# Patient Record
Sex: Female | Born: 1950
Health system: Southern US, Community
[De-identification: ages and names within clinical notes are randomized; demographics above are authoritative.]

## PROBLEM LIST (undated history)

## (undated) DIAGNOSIS — E119 Type 2 diabetes mellitus without complications: Secondary | ICD-10-CM

## (undated) DIAGNOSIS — I10 Essential (primary) hypertension: Secondary | ICD-10-CM

## (undated) DIAGNOSIS — H3500 Unspecified background retinopathy: Secondary | ICD-10-CM

## (undated) DIAGNOSIS — F330 Major depressive disorder, recurrent, mild: Secondary | ICD-10-CM

## (undated) DIAGNOSIS — D638 Anemia in other chronic diseases classified elsewhere: Secondary | ICD-10-CM

## (undated) DIAGNOSIS — E785 Hyperlipidemia, unspecified: Secondary | ICD-10-CM

## (undated) DIAGNOSIS — C4491 Basal cell carcinoma of skin, unspecified: Secondary | ICD-10-CM

## (undated) DIAGNOSIS — T7840XA Allergy, unspecified, initial encounter: Secondary | ICD-10-CM

## (undated) DIAGNOSIS — F419 Anxiety disorder, unspecified: Secondary | ICD-10-CM

## (undated) DIAGNOSIS — H269 Unspecified cataract: Secondary | ICD-10-CM

## (undated) DIAGNOSIS — M810 Age-related osteoporosis without current pathological fracture: Secondary | ICD-10-CM

## (undated) DIAGNOSIS — M797 Fibromyalgia: Secondary | ICD-10-CM

## (undated) DIAGNOSIS — M109 Gout, unspecified: Secondary | ICD-10-CM

## (undated) HISTORY — DX: Hyperlipidemia, unspecified: E78.5

## (undated) HISTORY — DX: Anxiety disorder, unspecified: F41.9

## (undated) HISTORY — PX: TONSILECTOMY, ADENOIDECTOMY, BILATERAL MYRINGOTOMY AND TUBES: SHX2538

## (undated) HISTORY — DX: Basal cell carcinoma of skin, unspecified: C44.91

## (undated) HISTORY — DX: Unspecified background retinopathy: H35.00

## (undated) HISTORY — DX: Anemia in other chronic diseases classified elsewhere: D63.8

## (undated) HISTORY — DX: Age-related osteoporosis without current pathological fracture: M81.0

## (undated) HISTORY — DX: Gout, unspecified: M10.9

## (undated) HISTORY — DX: Essential (primary) hypertension: I10

## (undated) HISTORY — DX: Fibromyalgia: M79.7

## (undated) HISTORY — DX: Unspecified cataract: H26.9

## (undated) HISTORY — DX: Type 2 diabetes mellitus without complications: E11.9

## (undated) HISTORY — PX: TUBAL LIGATION: SHX77

## (undated) HISTORY — DX: Major depressive disorder, recurrent, mild: F33.0

## (undated) HISTORY — DX: Allergy, unspecified, initial encounter: T78.40XA

---

## 1999-10-16 LAB — HM COLONOSCOPY: HM COLON: NORMAL

## 2004-10-25 ENCOUNTER — Ambulatory Visit: Payer: Self-pay

## 2006-11-14 ENCOUNTER — Ambulatory Visit: Payer: Self-pay | Admitting: Family Medicine

## 2007-02-19 ENCOUNTER — Emergency Department: Payer: Self-pay | Admitting: Emergency Medicine

## 2007-02-21 ENCOUNTER — Emergency Department: Payer: Self-pay | Admitting: Emergency Medicine

## 2008-05-27 ENCOUNTER — Ambulatory Visit: Payer: Self-pay | Admitting: Family Medicine

## 2009-12-13 ENCOUNTER — Ambulatory Visit: Payer: Self-pay

## 2011-01-16 ENCOUNTER — Ambulatory Visit: Payer: Self-pay

## 2012-01-15 ENCOUNTER — Ambulatory Visit: Payer: Self-pay

## 2012-01-15 LAB — HM MAMMOGRAPHY: HM MAMMO: NORMAL

## 2013-04-29 ENCOUNTER — Ambulatory Visit: Payer: Self-pay

## 2014-06-01 ENCOUNTER — Ambulatory Visit: Payer: Self-pay

## 2014-11-19 DIAGNOSIS — Z1389 Encounter for screening for other disorder: Secondary | ICD-10-CM | POA: Diagnosis not present

## 2014-11-19 DIAGNOSIS — I10 Essential (primary) hypertension: Secondary | ICD-10-CM | POA: Diagnosis not present

## 2014-11-19 DIAGNOSIS — Z1151 Encounter for screening for human papillomavirus (HPV): Secondary | ICD-10-CM | POA: Diagnosis not present

## 2014-11-19 DIAGNOSIS — M109 Gout, unspecified: Secondary | ICD-10-CM | POA: Diagnosis not present

## 2014-11-19 DIAGNOSIS — E785 Hyperlipidemia, unspecified: Secondary | ICD-10-CM | POA: Diagnosis not present

## 2014-11-19 DIAGNOSIS — Z719 Counseling, unspecified: Secondary | ICD-10-CM | POA: Diagnosis not present

## 2014-11-19 DIAGNOSIS — Z23 Encounter for immunization: Secondary | ICD-10-CM | POA: Diagnosis not present

## 2014-11-19 DIAGNOSIS — Z1239 Encounter for other screening for malignant neoplasm of breast: Secondary | ICD-10-CM | POA: Diagnosis not present

## 2014-11-19 DIAGNOSIS — Z124 Encounter for screening for malignant neoplasm of cervix: Secondary | ICD-10-CM | POA: Diagnosis not present

## 2014-11-19 DIAGNOSIS — E2839 Other primary ovarian failure: Secondary | ICD-10-CM | POA: Diagnosis not present

## 2014-11-19 DIAGNOSIS — Z1211 Encounter for screening for malignant neoplasm of colon: Secondary | ICD-10-CM | POA: Diagnosis not present

## 2014-11-19 DIAGNOSIS — Z9181 History of falling: Secondary | ICD-10-CM | POA: Diagnosis not present

## 2014-11-19 DIAGNOSIS — Z0000 Encounter for general adult medical examination without abnormal findings: Secondary | ICD-10-CM | POA: Diagnosis not present

## 2014-11-24 DIAGNOSIS — E11359 Type 2 diabetes mellitus with proliferative diabetic retinopathy without macular edema: Secondary | ICD-10-CM | POA: Diagnosis not present

## 2014-11-24 DIAGNOSIS — I10 Essential (primary) hypertension: Secondary | ICD-10-CM | POA: Diagnosis not present

## 2014-11-24 DIAGNOSIS — E785 Hyperlipidemia, unspecified: Secondary | ICD-10-CM | POA: Diagnosis not present

## 2014-11-24 DIAGNOSIS — M109 Gout, unspecified: Secondary | ICD-10-CM | POA: Diagnosis not present

## 2014-11-24 LAB — LIPID PANEL
Cholesterol: 130 mg/dL (ref 0–200)
HDL: 39 mg/dL (ref 35–70)
LDL Cholesterol: 63 mg/dL
Triglycerides: 142 mg/dL (ref 40–160)

## 2014-11-24 LAB — HEMOGLOBIN A1C: Hgb A1c MFr Bld: 6.9 % — AB (ref 4.0–6.0)

## 2014-12-03 DIAGNOSIS — E11359 Type 2 diabetes mellitus with proliferative diabetic retinopathy without macular edema: Secondary | ICD-10-CM | POA: Diagnosis not present

## 2014-12-03 DIAGNOSIS — R490 Dysphonia: Secondary | ICD-10-CM | POA: Diagnosis not present

## 2014-12-03 DIAGNOSIS — E785 Hyperlipidemia, unspecified: Secondary | ICD-10-CM | POA: Diagnosis not present

## 2014-12-03 DIAGNOSIS — I10 Essential (primary) hypertension: Secondary | ICD-10-CM | POA: Diagnosis not present

## 2014-12-03 DIAGNOSIS — H3500 Unspecified background retinopathy: Secondary | ICD-10-CM | POA: Diagnosis not present

## 2014-12-03 DIAGNOSIS — M109 Gout, unspecified: Secondary | ICD-10-CM | POA: Diagnosis not present

## 2014-12-05 ENCOUNTER — Emergency Department: Payer: Self-pay | Admitting: Emergency Medicine

## 2014-12-05 DIAGNOSIS — I10 Essential (primary) hypertension: Secondary | ICD-10-CM | POA: Diagnosis not present

## 2014-12-05 DIAGNOSIS — E119 Type 2 diabetes mellitus without complications: Secondary | ICD-10-CM | POA: Diagnosis not present

## 2014-12-05 DIAGNOSIS — R55 Syncope and collapse: Secondary | ICD-10-CM | POA: Diagnosis not present

## 2014-12-05 DIAGNOSIS — N3090 Cystitis, unspecified without hematuria: Secondary | ICD-10-CM | POA: Diagnosis not present

## 2014-12-20 ENCOUNTER — Ambulatory Visit: Payer: Self-pay | Admitting: Family Medicine

## 2014-12-20 DIAGNOSIS — E2839 Other primary ovarian failure: Secondary | ICD-10-CM | POA: Diagnosis not present

## 2014-12-20 DIAGNOSIS — M8589 Other specified disorders of bone density and structure, multiple sites: Secondary | ICD-10-CM | POA: Diagnosis not present

## 2014-12-20 DIAGNOSIS — M8580 Other specified disorders of bone density and structure, unspecified site: Secondary | ICD-10-CM | POA: Diagnosis not present

## 2014-12-20 LAB — HM DEXA SCAN

## 2015-04-02 ENCOUNTER — Encounter: Payer: Self-pay | Admitting: Family Medicine

## 2015-04-02 DIAGNOSIS — F5104 Psychophysiologic insomnia: Secondary | ICD-10-CM | POA: Insufficient documentation

## 2015-04-02 DIAGNOSIS — R4189 Other symptoms and signs involving cognitive functions and awareness: Secondary | ICD-10-CM | POA: Insufficient documentation

## 2015-04-02 DIAGNOSIS — F330 Major depressive disorder, recurrent, mild: Secondary | ICD-10-CM | POA: Insufficient documentation

## 2015-04-02 DIAGNOSIS — M109 Gout, unspecified: Secondary | ICD-10-CM | POA: Insufficient documentation

## 2015-04-02 DIAGNOSIS — I10 Essential (primary) hypertension: Secondary | ICD-10-CM | POA: Insufficient documentation

## 2015-04-02 DIAGNOSIS — H3500 Unspecified background retinopathy: Secondary | ICD-10-CM | POA: Insufficient documentation

## 2015-04-02 DIAGNOSIS — E113599 Type 2 diabetes mellitus with proliferative diabetic retinopathy without macular edema, unspecified eye: Secondary | ICD-10-CM | POA: Insufficient documentation

## 2015-04-02 DIAGNOSIS — C4491 Basal cell carcinoma of skin, unspecified: Secondary | ICD-10-CM | POA: Insufficient documentation

## 2015-04-02 DIAGNOSIS — E785 Hyperlipidemia, unspecified: Secondary | ICD-10-CM | POA: Insufficient documentation

## 2015-04-02 DIAGNOSIS — M8580 Other specified disorders of bone density and structure, unspecified site: Secondary | ICD-10-CM | POA: Insufficient documentation

## 2015-04-02 DIAGNOSIS — M797 Fibromyalgia: Secondary | ICD-10-CM | POA: Insufficient documentation

## 2015-04-04 ENCOUNTER — Other Ambulatory Visit: Payer: Self-pay | Admitting: Family Medicine

## 2015-04-04 NOTE — Telephone Encounter (Signed)
Patient requesting refill. 

## 2015-04-06 ENCOUNTER — Ambulatory Visit (INDEPENDENT_AMBULATORY_CARE_PROVIDER_SITE_OTHER): Payer: Medicare Other | Admitting: Family Medicine

## 2015-04-06 ENCOUNTER — Encounter: Payer: Self-pay | Admitting: Family Medicine

## 2015-04-06 ENCOUNTER — Other Ambulatory Visit: Payer: Self-pay | Admitting: Family Medicine

## 2015-04-06 VITALS — BP 122/78 | HR 91 | Temp 98.7°F | Resp 18 | Ht 59.0 in | Wt 171.7 lb

## 2015-04-06 DIAGNOSIS — F329 Major depressive disorder, single episode, unspecified: Secondary | ICD-10-CM

## 2015-04-06 DIAGNOSIS — E1136 Type 2 diabetes mellitus with diabetic cataract: Secondary | ICD-10-CM

## 2015-04-06 DIAGNOSIS — F32A Depression, unspecified: Secondary | ICD-10-CM

## 2015-04-06 DIAGNOSIS — E785 Hyperlipidemia, unspecified: Secondary | ICD-10-CM | POA: Diagnosis not present

## 2015-04-06 DIAGNOSIS — I10 Essential (primary) hypertension: Secondary | ICD-10-CM

## 2015-04-06 DIAGNOSIS — E1165 Type 2 diabetes mellitus with hyperglycemia: Secondary | ICD-10-CM

## 2015-04-06 DIAGNOSIS — H269 Unspecified cataract: Secondary | ICD-10-CM | POA: Insufficient documentation

## 2015-04-06 DIAGNOSIS — M109 Gout, unspecified: Secondary | ICD-10-CM

## 2015-04-06 DIAGNOSIS — E669 Obesity, unspecified: Secondary | ICD-10-CM | POA: Diagnosis not present

## 2015-04-06 LAB — POCT GLYCOSYLATED HEMOGLOBIN (HGB A1C): HEMOGLOBIN A1C: 6.9

## 2015-04-06 MED ORDER — FLUOXETINE HCL 20 MG PO CAPS: 20.0000 mg | ORAL_CAPSULE | Freq: Every day | ORAL | Status: DC

## 2015-04-06 MED ORDER — OLMESARTAN MEDOXOMIL 40 MG PO TABS: 40.0000 mg | ORAL_TABLET | Freq: Every day | ORAL | Status: DC

## 2015-04-06 MED ORDER — GLIPIZIDE 5 MG PO TABS: 2.5000 mg | ORAL_TABLET | Freq: Two times a day (BID) | ORAL | Status: DC

## 2015-04-06 MED ORDER — SITAGLIP PHOS-METFORMIN HCL ER 50-1000 MG PO TB24: 2.0000 | ORAL_TABLET | Freq: Every day | ORAL | Status: DC

## 2015-04-06 MED ORDER — ALLOPURINOL 100 MG PO TABS: 100.0000 mg | ORAL_TABLET | Freq: Every day | ORAL | Status: DC

## 2015-04-06 MED ORDER — CARVEDILOL 25 MG PO TABS: 50.0000 mg | ORAL_TABLET | Freq: Every day | ORAL | Status: DC

## 2015-04-06 MED ORDER — ROSUVASTATIN CALCIUM 20 MG PO TABS: 20.0000 mg | ORAL_TABLET | Freq: Every day | ORAL | Status: DC

## 2015-04-06 NOTE — Progress Notes (Signed)
Name: Lindsey [name]   MRN: [id number]    DOB: [birth year]   Date:04/06/2015       Progress Note  Subjective  Chief Complaint  Chief Complaint  Patient presents with  . Medication Refill    4 month F/U  . Diabetes    Checks blood sugar once daily  . Hypertension  . Hyperlipidemia  . Depression    HPI  DMII: she is complaint with her medication, glucose has been stable, denies hypoglycemia, no polydipsia, polyuria or polyphagia. Normal foot exam, eye exam is up to date. Immunization is up to date.   HTN: taking medication and bp is at goal, however because of elevation in uric acid we will stop HCTZ and increase dose of Benicar 40mg . No chest pain, no SOB  Dyslipidemia: taking Crestor, compliant with medication and denies side effects. LDL is at goal  Gout: uric acid very high in Feb, we will stop HCTZ, she was given Allopurinol but never started, we will resend prescription to pharmacy. Recheck levels next visit  Depression: in remission, continue medications   Patient Active Problem List   Diagnosis Date Noted  . Type 2 diabetes mellitus, controlled, with ophthalmic manifestations, with cataract 04/06/2015  . Cataract 04/06/2015  . Benign essential HTN 04/02/2015  . Controlled gout 04/02/2015  . Dyslipidemia 04/02/2015  . Clinical depression 04/02/2015  . Fibromyalgia syndrome 04/02/2015  . Chronic insomnia 04/02/2015  . Mild cognitive disorder 04/02/2015  . Nodular basal cell carcinoma 04/02/2015  . Obesity (BMI 30-39.9) 04/02/2015  . Osteopenia 04/02/2015  . Calcium blood increased 04/02/2015    Past Surgical History  Procedure Laterality Date  . Cesarean section    . Tonsilectomy, adenoidectomy, bilateral myringotomy and tubes      Family History  Problem Relation Age of Onset  . Hypertension Mother   . Osteoporosis Mother   . Cancer Mother     brain tumor  . Hypertension Sister   . Diabetes Sister   . Hypertension Sister   . Cancer Sister   . Cancer  Sister 12    breast    History   Social History  . Marital Status: Divorced    Spouse Name: N/A  . Number of Children: N/A  . Years of Education: N/A   Occupational History  . Not on file.   Social History Main Topics  . Smoking status: Never Smoker   . Smokeless tobacco: Not on file  . Alcohol Use: No  . Drug Use: No  . Sexual Activity: Not Currently   Other Topics Concern  . Not on file   Social History Narrative     Current outpatient prescriptions:  .  allopurinol (ZYLOPRIM) 100 MG tablet, Take 1 tablet (100 mg total) by mouth daily., Disp: 30 tablet, Rfl: 8 .  aspirin 81 MG chewable tablet, Chew 1 tablet by mouth daily., Disp: , Rfl:  .  BLACK COHOSH PO, Take by mouth., Disp: , Rfl:  .  calcium-vitamin D (OSCAL 500/200 D-3) 500-200 MG-UNIT per tablet, Take 1 tablet by mouth daily., Disp: , Rfl:  .  carvedilol (COREG) 25 MG tablet, Take 2 tablets (50 mg total) by mouth daily., Disp: 60 tablet, Rfl: 8 .  FLUoxetine (PROZAC) 20 MG capsule, Take 1 capsule (20 mg total) by mouth daily., Disp: 30 capsule, Rfl: 8 .  GLUCOSE BLOOD VI, , Disp: , Rfl:  .  MULTIPLE VITAMIN PO, Take 1 tablet by mouth daily., Disp: , Rfl:  .  raloxifene (EVISTA)  60 MG tablet, Take 1 tablet by mouth daily., Disp: , Rfl:  .  rosuvastatin (CRESTOR) 20 MG tablet, Take 1 tablet (20 mg total) by mouth daily., Disp: 30 tablet, Rfl: 5 .  SitaGLIPtin-MetFORMIN HCl (JANUMET XR) 50-1000 MG TB24, Take 2 tablets by mouth daily., Disp: 30 tablet, Rfl: 8 .  glipiZIDE (GLUCOTROL) 5 MG tablet, Take 0.5 tablets (2.5 mg total) by mouth 2 (two) times daily before a meal., Disp: 30 tablet, Rfl: 8 .  olmesartan (BENICAR) 40 MG tablet, Take 1 tablet (40 mg total) by mouth daily., Disp: 30 tablet, Rfl: 8  Allergies  Allergen Reactions  . Ace Inhibitors      ROS  Constitutional: Negative for fever or weight change.  Respiratory: Negative for cough and shortness of breath.   Cardiovascular: Negative for chest  pain or palpitations.  Gastrointestinal: Negative for abdominal pain, no bowel changes.  Musculoskeletal: Negative for gait problem or joint swelling.  Skin: Negative for rash.  Neurological: Negative for dizziness or headache.  No other specific complaints in a complete review of systems (except as listed in HPI above).  Objective  Filed Vitals:   04/06/15 0932  BP: 122/78  Pulse: 91  Temp: 98.7 F (37.1 C)  TempSrc: Oral  Resp: 18  Height: 4\' 11"  (1.499 m)  Weight: 171 lb 11.2 oz (77.883 kg)  SpO2: 95%    Body mass index is 34.66 kg/(m^2).  Physical Exam  Constitutional: Patient appears well-developed and well-nourished. No distress.  HENT: Head: Normocephalic and atraumatic.  Nose: Nose normal. Mouth/Throat: Oropharynx is clear and moist. No oropharyngeal exudate.  Eyes: Conjunctivae and EOM are normal. Pupils are equal, round, and reactive to light. No scleral icterus.  Neck: Normal range of motion. Neck supple. No JVD present. No thyromegaly present.  Cardiovascular: Normal rate, regular rhythm and normal heart sounds.  No murmur heard. No BLE edema. Varicose veins Pulmonary/Chest: Effort normal and breath sounds normal. No respiratory distress. Abdominal: Soft.There is no tenderness. no masses Musculoskeletal: Normal range of motion, no joint effusions. No gross deformities Neurological: he is alert and oriented to person, place, and time. No cranial nerve deficit. Coordination, balance, strength, speech and gait are normal.  Skin: Skin is warm and dry. No rash noted. No erythema.  Psychiatric: Patient has a normal mood and affect. behavior is normal. Judgment and thought content normal.  Recent Results (from the past 2160 hour(s))  POCT HgB A1C     Status: Normal   Collection Time: 04/06/15  9:41 AM  Result Value Ref Range   Hemoglobin A1C 6.9     Diabetic Foot Exam: Diabetic Foot Exam - Simple   Simple Foot Form  Visual Inspection  No deformities, no  ulcerations, no other skin breakdown bilaterally:  Yes  Sensation Testing  Intact to touch and monofilament testing bilaterally:  Yes  Pulse Check  Posterior Tibialis and Dorsalis pulse intact bilaterally:  Yes  Comments      PHQ2/9: Depression screen PHQ 2/9 04/06/2015  Decreased Interest 0  Down, Depressed, Hopeless 0  PHQ - 2 Score 0    Fall Risk: Fall Risk  04/06/2015  Falls in the past year? Yes  Number falls in past yr: 1  Injury with Fall? No     Assessment & Plan  1. Type 2 diabetes mellitus with diabetic cataract Continue medication - POCT HgB A1C - SitaGLIPtin-MetFORMIN HCl (JANUMET XR) 50-1000 MG TB24; Take 2 tablets by mouth daily.  Dispense: 30 tablet; Refill: 8 -  glipiZIDE (GLUCOTROL) 5 MG tablet; Take 0.5 tablets (2.5 mg total) by mouth 2 (two) times daily before a meal.  Dispense: 30 tablet; Refill: 8  2. Controlled gout Start medication ,high uric acid - allopurinol (ZYLOPRIM) 100 MG tablet; Take 1 tablet (100 mg total) by mouth daily.  Dispense: 30 tablet; Refill: 8  3. Clinical depression In remission, continue medication, working in her flower beds, enjoys carrying for her grandchildren - FLUoxetine (PROZAC) 20 MG capsule; Take 1 capsule (20 mg total) by mouth daily.  Dispense: 30 capsule; Refill: 8  4. Obesity (BMI 30-39.9) Discussed diet and exercise  5. Benign essential HTN Adjust medication, because of elevated uric acid level - carvedilol (COREG) 25 MG tablet; Take 2 tablets (50 mg total) by mouth daily.  Dispense: 60 tablet; Refill: 8 - olmesartan (BENICAR) 40 MG tablet; Take 1 tablet (40 mg total) by mouth daily.  Dispense: 30 tablet; Refill: 8  6. Dyslipidemia At goal  - rosuvastatin (CRESTOR) 20 MG tablet; Take 1 tablet (20 mg total) by mouth daily.  Dispense: 30 tablet; Refill: 5

## 2015-04-06 NOTE — Patient Instructions (Signed)

## 2015-06-06 ENCOUNTER — Telehealth: Payer: Self-pay

## 2015-06-06 NOTE — Telephone Encounter (Signed)
Opened in error

## 2015-06-24 ENCOUNTER — Ambulatory Visit
Admission: EM | Admit: 2015-06-24 | Discharge: 2015-06-24 | Disposition: A | Discharge status: Home / Self Care | Payer: Medicare Other | Attending: Family Medicine | Admitting: Family Medicine

## 2015-06-24 ENCOUNTER — Encounter: Payer: Self-pay | Admitting: Emergency Medicine

## 2015-06-24 DIAGNOSIS — E785 Hyperlipidemia, unspecified: Secondary | ICD-10-CM | POA: Insufficient documentation

## 2015-06-24 DIAGNOSIS — R350 Frequency of micturition: Secondary | ICD-10-CM | POA: Insufficient documentation

## 2015-06-24 DIAGNOSIS — R300 Dysuria: Secondary | ICD-10-CM | POA: Insufficient documentation

## 2015-06-24 DIAGNOSIS — Z85828 Personal history of other malignant neoplasm of skin: Secondary | ICD-10-CM | POA: Diagnosis not present

## 2015-06-24 DIAGNOSIS — I10 Essential (primary) hypertension: Secondary | ICD-10-CM | POA: Diagnosis not present

## 2015-06-24 DIAGNOSIS — M109 Gout, unspecified: Secondary | ICD-10-CM | POA: Insufficient documentation

## 2015-06-24 LAB — URINALYSIS COMPLETE WITH MICROSCOPIC (ARMC ONLY)
Bacteria, UA: NONE SEEN — AB
Bilirubin Urine: NEGATIVE
Glucose, UA: NEGATIVE mg/dL
Hgb urine dipstick: NEGATIVE
KETONES UR: NEGATIVE mg/dL
LEUKOCYTES UA: NEGATIVE
Nitrite: NEGATIVE
PROTEIN: NEGATIVE mg/dL
SPECIFIC GRAVITY, URINE: 1.01 (ref 1.005–1.030)
pH: 5 (ref 5.0–8.0)

## 2015-06-24 MED ORDER — PHENAZOPYRIDINE HCL 100 MG PO TABS: 100.0000 mg | ORAL_TABLET | Freq: Three times a day (TID) | ORAL | Status: DC | PRN

## 2015-06-24 NOTE — ED Notes (Addendum)
Urinary frequency for 1 week, low back pain

## 2015-06-24 NOTE — Discharge Instructions (Signed)

## 2015-06-25 NOTE — ED Provider Notes (Signed)
CSN: [id number]     Arrival date & time 06/24/15  1643 History   First MD Initiated Contact with Patient 06/24/15 1916     Chief Complaint  Patient presents with  . Urinary Frequency   (Consider location/radiation/quality/duration/timing/severity/associated sxs/prior Treatment) HPI Comments: Single caucasian female with left back pain; urinary frequency x 10 days, headache, decreased appetite.  Lives alone diabetic.  Home fingersticks have been her usual but cannot remember the actual number.  Denied changes in color or smell of urine.  Patient is a 64 y.o. female presenting with frequency. The history is provided by the patient.  Urinary Frequency This is a new problem. The current episode started more than 1 week ago. The problem occurs constantly. The problem has been gradually worsening. Associated symptoms include headaches. Pertinent negatives include no chest pain, no abdominal pain and no shortness of breath. The symptoms are aggravated by exertion. The symptoms are relieved by lying down, eating, position, sleep, rest, relaxation and drinking. She has tried rest, food and water for the symptoms. The treatment provided no relief.    Past Medical History  Diagnosis Date  . Basal cell carcinoma   . Osteoporosis   . Retinopathy   . Acute gout   . Hypertension   . Hyperlipidemia    Past Surgical History  Procedure Laterality Date  . Cesarean section    . Tonsilectomy, adenoidectomy, bilateral myringotomy and tubes     Family History  Problem Relation Age of Onset  . Hypertension Mother   . Osteoporosis Mother   . Cancer Mother     brain tumor  . Hypertension Sister   . Diabetes Sister   . Hypertension Sister   . Cancer Sister   . Cancer Sister 78    breast   Social History  Substance Use Topics  . Smoking status: Never Smoker   . Smokeless tobacco: None  . Alcohol Use: No   OB History    No data available     Review of Systems  Constitutional: Positive for  appetite change. Negative for fever, chills, diaphoresis, activity change, fatigue and unexpected weight change.  HENT: Negative for congestion, dental problem, drooling, ear discharge, ear pain, facial swelling, hearing loss, mouth sores, nosebleeds, postnasal drip, rhinorrhea, sinus pressure, sneezing, sore throat, tinnitus, trouble swallowing and voice change.   Eyes: Negative for photophobia, pain, discharge, redness, itching and visual disturbance.  Respiratory: Negative for cough, shortness of breath, wheezing and stridor.   Cardiovascular: Negative for chest pain, palpitations and leg swelling.  Gastrointestinal: Negative for nausea, vomiting, abdominal pain, diarrhea, constipation, blood in stool and abdominal distention.  Endocrine: Negative for cold intolerance, heat intolerance, polydipsia, polyphagia and polyuria.  Genitourinary: Positive for urgency and frequency. Negative for dysuria, hematuria, flank pain, decreased urine volume, vaginal bleeding, vaginal discharge, enuresis, difficulty urinating, genital sores, vaginal pain, menstrual problem and pelvic pain.  Musculoskeletal: Positive for back pain. Negative for myalgias, joint swelling, arthralgias, gait problem, neck pain and neck stiffness.  Skin: Negative for color change, pallor, rash and wound.  Allergic/Immunologic: Negative for environmental allergies and food allergies.  Neurological: Positive for headaches. Negative for dizziness, tremors, seizures, syncope, facial asymmetry, speech difficulty, weakness, light-headedness and numbness.  Hematological: Negative for adenopathy. Does not bruise/bleed easily.  Psychiatric/Behavioral: Negative for behavioral problems, confusion, sleep disturbance and agitation.    Allergies  Ace inhibitors  Home Medications   Prior to Admission medications   Medication Sig Start Date End Date Taking? Authorizing Provider  allopurinol (ZYLOPRIM)  100 MG tablet Take 1 tablet (100 mg total) by  mouth daily. 04/06/15   Steele Sizer, MD  aspirin 81 MG chewable tablet Chew 1 tablet by mouth daily. 03/06/07   Historical Provider, MD  BLACK COHOSH PO Take by mouth.    Historical Provider, MD  calcium-vitamin D (OSCAL 500/200 D-3) 500-200 MG-UNIT per tablet Take 1 tablet by mouth daily. 03/06/07   Historical Provider, MD  carvedilol (COREG) 25 MG tablet Take 2 tablets (50 mg total) by mouth daily. 04/06/15   Steele Sizer, MD  FLUoxetine (PROZAC) 20 MG capsule Take 1 capsule (20 mg total) by mouth daily. 04/06/15   Steele Sizer, MD  glipiZIDE (GLUCOTROL) 5 MG tablet Take 0.5 tablets (2.5 mg total) by mouth 2 (two) times daily before a meal. 04/06/15   Steele Sizer, MD  GLUCOSE BLOOD VI  05/26/07   Historical Provider, MD  MULTIPLE VITAMIN PO Take 1 tablet by mouth daily. 12/28/10   Historical Provider, MD  olmesartan (BENICAR) 40 MG tablet Take 1 tablet (40 mg total) by mouth daily. 04/06/15   Steele Sizer, MD  phenazopyridine (PYRIDIUM) 100 MG tablet Take 1 tablet (100 mg total) by mouth 3 (three) times daily as needed for pain. 06/24/15   Olen Cordial, NP  raloxifene (EVISTA) 60 MG tablet Take 1 tablet by mouth daily. 07/15/14   Historical Provider, MD  rosuvastatin (CRESTOR) 20 MG tablet Take 1 tablet (20 mg total) by mouth daily. 04/06/15   Steele Sizer, MD  SitaGLIPtin-MetFORMIN HCl (JANUMET XR) 50-1000 MG TB24 Take 2 tablets by mouth daily. 04/06/15   Steele Sizer, MD   Meds Ordered and Administered this Visit  Medications - No data to display  BP 149/82 mmHg  Pulse 86  Temp(Src) 97.5 F (36.4 C) (Tympanic)  Resp 16  Ht 4\' 11"  (1.499 m)  Wt 170 lb (77.111 kg)  BMI 34.32 kg/m2  SpO2 100% No data found.   Physical Exam  Constitutional: She is oriented to person, place, and time. Vital signs are normal. She appears well-developed and well-nourished. No distress.  HENT:  Head: Normocephalic and atraumatic.  Right Ear: External ear normal.  Left Ear: External ear normal.    Nose: Nose normal.  Mouth/Throat: Oropharynx is clear and moist. No oropharyngeal exudate.  Eyes: Conjunctivae, EOM and lids are normal. Pupils are equal, round, and reactive to light. Right eye exhibits no discharge. Left eye exhibits no discharge. No scleral icterus.  Neck: Trachea normal and normal range of motion. Neck supple. No tracheal deviation present.  Cardiovascular: Normal rate, regular rhythm, normal heart sounds and intact distal pulses.  Exam reveals no gallop and no friction rub.   No murmur heard. Pulmonary/Chest: Effort normal and breath sounds normal. No stridor. No respiratory distress. She has no wheezes. She has no rales. She exhibits no tenderness.  Abdominal: Soft. Bowel sounds are normal. She exhibits no shifting dullness, no distension, no pulsatile liver, no fluid wave, no abdominal bruit, no ascites, no pulsatile midline mass and no mass. There is no hepatosplenomegaly. There is no tenderness. There is no rigidity, no rebound, no guarding, no CVA tenderness, no tenderness at McBurney's point and negative Murphy's sign. Hernia confirmed negative in the ventral area.  Dull to percussion x 4 quads  Musculoskeletal: Normal range of motion. She exhibits no edema or tenderness.       Lumbar back: She exhibits pain. She exhibits normal range of motion, no tenderness, no bony tenderness, no swelling, no edema, no deformity,  no laceration, no spasm and normal pulse.       Back:  Pain left lumbar  Lymphadenopathy:    She has no cervical adenopathy.  Neurological: She is alert and oriented to person, place, and time. She displays no atrophy and no tremor. No cranial nerve deficit or sensory deficit. She exhibits normal muscle tone. She displays no seizure activity. Coordination and gait normal. GCS eye subscore is 4. GCS verbal subscore is 5. GCS motor subscore is 6.  Skin: Skin is warm, dry and intact. No rash noted. She is not diaphoretic. No erythema. No pallor.  Psychiatric:  She has a normal mood and affect. Her speech is normal and behavior is normal. Judgment and thought content normal. Cognition and memory are normal.  Nursing note and vitals reviewed.   ED Course  Procedures (including critical care time)  Labs Review Labs Reviewed  URINALYSIS COMPLETEWITH MICROSCOPIC ([facility] ONLY) - Abnormal; Notable for the following:    Color, Urine STRAW (*)    Bacteria, UA NONE SEEN (*)    Squamous Epithelial / LPF 6-30 (*)    All other components within normal limits    Imaging Review No results found.   MDM   1. Dysuria    Urinalysis negative for leukocytes or bacteria.  Probable nonbacterial cystitis/urethritis from medications/dehydration due to hot weather.  Will call with urine culture results once available typically 48 hours.  Continue to monitor blood sugar and if elevated contact me.  Medications as directed.  Patient is also to push fluids and may use Pyridium 100mg  po TID as needed.  Hydrate, avoid dehydration.  Avoid holding urine void on frequent basis every 4 to 6 hours.  If unable to void every 8 hours, hematuria, worsening back pain, cloudy urine, fever greater than 100.110F follow up for re-evaluation with PCM, urgent care or ER.   Call or return to clinic as needed if these symptoms worsen or fail to improve as anticipated.  Exitcare handout on dysuria given to patient Patient verbalized agreement and understanding of treatment plan and had no further questions at this time. P2:  Hydrate and cranberry juice    Olen Cordial, NP 06/25/15 1834

## 2015-06-27 ENCOUNTER — Ambulatory Visit: Payer: Medicare Other | Admitting: Family Medicine

## 2015-06-28 LAB — URINE CULTURE: Culture: 4000

## 2015-06-29 ENCOUNTER — Telehealth: Payer: Self-pay

## 2015-06-29 NOTE — ED Notes (Signed)
Daughter called requesting results of urine culture. Informed that culture results negative. Daughter stated "no she has an infection and needs antibiotics". Reiterated that culture results were negative. Daughter then questioned as to why Mother is confused. Instructed need to take patient to the ER for further evaluation.

## 2015-07-01 ENCOUNTER — Encounter: Payer: Self-pay | Admitting: Family Medicine

## 2015-07-01 ENCOUNTER — Ambulatory Visit (INDEPENDENT_AMBULATORY_CARE_PROVIDER_SITE_OTHER): Payer: Medicare Other | Admitting: Family Medicine

## 2015-07-01 ENCOUNTER — Telehealth: Payer: Self-pay | Admitting: Family Medicine

## 2015-07-01 VITALS — BP 144/76 | HR 100 | Temp 98.4°F | Resp 16 | Wt 164.3 lb

## 2015-07-01 DIAGNOSIS — F329 Major depressive disorder, single episode, unspecified: Secondary | ICD-10-CM

## 2015-07-01 DIAGNOSIS — F09 Unspecified mental disorder due to known physiological condition: Secondary | ICD-10-CM

## 2015-07-01 DIAGNOSIS — F068 Other specified mental disorders due to known physiological condition: Secondary | ICD-10-CM | POA: Diagnosis not present

## 2015-07-01 DIAGNOSIS — R443 Hallucinations, unspecified: Secondary | ICD-10-CM

## 2015-07-01 DIAGNOSIS — Z23 Encounter for immunization: Secondary | ICD-10-CM

## 2015-07-01 DIAGNOSIS — I10 Essential (primary) hypertension: Secondary | ICD-10-CM | POA: Diagnosis not present

## 2015-07-01 DIAGNOSIS — D649 Anemia, unspecified: Secondary | ICD-10-CM | POA: Diagnosis not present

## 2015-07-01 DIAGNOSIS — F32A Depression, unspecified: Secondary | ICD-10-CM

## 2015-07-01 LAB — POCT URINALYSIS DIPSTICK
BILIRUBIN UA: NEGATIVE
Glucose, UA: NEGATIVE
KETONES UA: NEGATIVE
NITRITE UA: NEGATIVE
PH UA: 5.5
Protein, UA: NEGATIVE
RBC UA: NEGATIVE
SPEC GRAV UA: 1.02
Urobilinogen, UA: 0.2

## 2015-07-01 MED ORDER — DIVALPROEX SODIUM 250 MG PO DR TAB: 250.0000 mg | DELAYED_RELEASE_TABLET | Freq: Two times a day (BID) | ORAL | Status: DC

## 2015-07-01 NOTE — Telephone Encounter (Signed)
Contacted patient to follow up dysuria appt.  Urine culture grew out 4,000 insignificant growth.  Daughter spoke with RN Lauretta Grill on 14 Sep to receive urine culture results.  Patient still having urinary frequency at that time.  Contacted patient via telephone today and has appt with PCM for follow up reevaluation.  Patient reported back pain intermittent with urinary frequency continues denied worsening of symptoms.  Encouraged patient to keep appt for re-evaluation.  Recommended repeat urinalysis today and to discuss with her provider.  Discussed with patient her chronic medications and hyperglycemia can result in cystitis/urethritis and to hydrate.  Patient verbalized understanding of information/instructions, agreed with plan of care and had no further questions at this time.

## 2015-07-01 NOTE — Progress Notes (Signed)
Name: Lindsey [name]   MRN: [id number]    DOB: 1951-08-30   Date:07/01/2015       Progress Note  Subjective  Chief Complaint  Chief Complaint  Patient presents with  . Hallucinations    onset 3-4weeks daughter states pt is saying she is nervous because other people are in her home upsatirs and downstairs. Pt went to urgent care thinking it was UTI but when got urine culture back it was negative for culture.  Daughter states she was also just changed to benicar and does know if that might be the reason.    HPI  Hallucinations : she has a history of mild memory loss going on for years, but over the past few weeks her mother has been having daily auditory hallucinations. She hears noises , usually the voice of her ex-husband - who was very mean to her - in another room of the house.  She calls her daughter to check on her and she is usually very nervous. Daughter has noticed a change in her appetite, her balance does not seem to be normal, but normal facial expression. No new stress in her life. She does take antidepressant medications. No family history of schizophrenia or dementia.  Daughter took her to Urgent Care to make sure it was not an UTI but culture was negative.    HTN: bp is usually at goal , but is elevated today  Patient Active Problem List   Diagnosis Date Noted  . Type 2 diabetes mellitus, controlled, with ophthalmic manifestations, with cataract 04/06/2015  . Cataract 04/06/2015  . Benign essential HTN 04/02/2015  . Controlled gout 04/02/2015  . Dyslipidemia 04/02/2015  . Clinical depression 04/02/2015  . Fibromyalgia syndrome 04/02/2015  . Chronic insomnia 04/02/2015  . Mild cognitive disorder 04/02/2015  . Nodular basal cell carcinoma 04/02/2015  . Obesity (BMI 30-39.9) 04/02/2015  . Osteopenia 04/02/2015  . Calcium blood increased 04/02/2015    Past Surgical History  Procedure Laterality Date  . Cesarean section    . Tonsilectomy, adenoidectomy, bilateral  myringotomy and tubes      Family History  Problem Relation Age of Onset  . Hypertension Mother   . Osteoporosis Mother   . Cancer Mother     brain tumor  . Hypertension Sister   . Diabetes Sister   . Hypertension Sister   . Cancer Sister   . Cancer Sister 25    breast    Social History   Social History  . Marital Status: Divorced    Spouse Name: N/A  . Number of Children: N/A  . Years of Education: N/A   Occupational History  . Not on file.   Social History Main Topics  . Smoking status: Never Smoker   . Smokeless tobacco: Not on file  . Alcohol Use: No  . Drug Use: No  . Sexual Activity: Not Currently   Other Topics Concern  . Not on file   Social History Narrative     Current outpatient prescriptions:  .  allopurinol (ZYLOPRIM) 100 MG tablet, Take 1 tablet (100 mg total) by mouth daily., Disp: 30 tablet, Rfl: 8 .  aspirin 81 MG chewable tablet, Chew 1 tablet by mouth daily., Disp: , Rfl:  .  BLACK COHOSH PO, Take by mouth., Disp: , Rfl:  .  calcium-vitamin D (OSCAL 500/200 D-3) 500-200 MG-UNIT per tablet, Take 1 tablet by mouth daily., Disp: , Rfl:  .  carvedilol (COREG) 25 MG tablet, Take 2 tablets (50 mg  total) by mouth daily., Disp: 60 tablet, Rfl: 8 .  FLUoxetine (PROZAC) 20 MG capsule, Take 1 capsule (20 mg total) by mouth daily., Disp: 30 capsule, Rfl: 8 .  glipiZIDE (GLUCOTROL) 5 MG tablet, Take 0.5 tablets (2.5 mg total) by mouth 2 (two) times daily before a meal., Disp: 30 tablet, Rfl: 8 .  GLUCOSE BLOOD VI, , Disp: , Rfl:  .  MULTIPLE VITAMIN PO, Take 1 tablet by mouth daily., Disp: , Rfl:  .  olmesartan (BENICAR) 40 MG tablet, Take 1 tablet (40 mg total) by mouth daily., Disp: 30 tablet, Rfl: 8 .  raloxifene (EVISTA) 60 MG tablet, Take 1 tablet by mouth daily., Disp: , Rfl:  .  rosuvastatin (CRESTOR) 20 MG tablet, Take 1 tablet (20 mg total) by mouth daily., Disp: 30 tablet, Rfl: 5 .  SitaGLIPtin-MetFORMIN HCl (JANUMET XR) 50-1000 MG TB24, Take 2  tablets by mouth daily., Disp: 60 tablet, Rfl: 8  Allergies  Allergen Reactions  . Ace Inhibitors      ROS  Constitutional: Negative for fever or weight change.  Respiratory: Negative for cough and shortness of breath.   Cardiovascular: Negative for chest pain or palpitations.  Gastrointestinal: Negative for abdominal pain, no bowel changes.  Musculoskeletal: Mild  gait problem or joint swelling.  Skin: Negative for rash.  Neurological: Negative for dizziness or headache.  No other specific complaints in a complete review of systems (except as listed in HPI above).  Objective  Filed Vitals:   07/01/15 1054  BP: 142/76  Pulse: 100  Temp: 98.4 F (36.9 C)  TempSrc: Oral  Resp: 16  Weight: 164 lb 4.8 oz (74.526 kg)  SpO2: 97%    Body mass index is 33.17 kg/(m^2).  Physical Exam  Constitutional: Patient appears well-developed and well-nourished. Obese  No distress.  HEENT: head atraumatic, normocephalic, pupils equal and reactive to light, ears, normal TM  neck supple, throat within normal limits Cardiovascular: Normal rate, regular rhythm and normal heart sounds.  No murmur heard. No BLE edema. Pulmonary/Chest: Effort normal and breath sounds normal. No respiratory distress. Abdominal: Soft.  There is no tenderness. Psychiatric: Patient has a normal mood and affect. behavior is normal. Judgment and thought content normal. She was complaining that she was nervous during the Mini mental status test  Recent Results (from the past 2160 hour(s))  POCT HgB A1C     Status: Normal   Collection Time: 04/06/15  9:41 AM  Result Value Ref Range   Hemoglobin A1C 6.9   Urine culture     Status: None   Collection Time: 06/23/15  5:50 PM  Result Value Ref Range   Specimen Description URINE, CLEAN CATCH    Special Requests none Immunocompromised    Culture 4,000 COLONIES/mL INSIGNIFICANT GROWTH    Report Status 06/28/2015 FINAL   Urinalysis complete, with microscopic     Status:  Abnormal   Collection Time: 06/24/15  5:50 PM  Result Value Ref Range   Color, Urine STRAW (A) YELLOW   APPearance CLEAR CLEAR   Glucose, UA NEGATIVE NEGATIVE mg/dL   Bilirubin Urine NEGATIVE NEGATIVE   Ketones, ur NEGATIVE NEGATIVE mg/dL   Specific Gravity, Urine 1.010 1.005 - 1.030   Hgb urine dipstick NEGATIVE NEGATIVE   pH 5.0 5.0 - 8.0   Protein, ur NEGATIVE NEGATIVE mg/dL   Nitrite NEGATIVE NEGATIVE   Leukocytes, UA NEGATIVE NEGATIVE   RBC / HPF 0-5 <3 RBC/hpf   WBC, UA 0-5 <3 WBC/hpf   Bacteria, UA  NONE SEEN (A) RARE   Squamous Epithelial / LPF 6-30 (A) RARE  POCT urinalysis dipstick     Status: Normal   Collection Time: 07/01/15 11:00 AM  Result Value Ref Range   Color, UA yellow    Clarity, UA clear    Glucose, UA neg    Bilirubin, UA neg    Ketones, UA neg    Spec Grav, UA 1.020    Blood, UA neg    pH, UA 5.5    Protein, UA neg    Urobilinogen, UA 0.2    Nitrite, UA neg    Leukocytes, UA Trace (A) Negative    PHQ2/9: Depression screen [facility] 2/9 07/01/2015 04/06/2015  Decreased Interest 0 0  Down, Depressed, Hopeless 0 0  PHQ - 2 Score 0 0     Fall Risk: Fall Risk  07/01/2015 04/06/2015  Falls in the past year? No Yes  Number falls in past yr: - 1  Injury with Fall? - No     Functional Status Survey: Is the patient deaf or have difficulty hearing?: No Does the patient have difficulty seeing, even when wearing glasses/contacts?: Yes (glasses) Does the patient have difficulty concentrating, remembering, or making decisions?: No Does the patient have difficulty walking or climbing stairs?: No Does the patient have difficulty dressing or bathing?: No Does the patient have difficulty doing errands alone such as visiting a doctor's office or shopping?: No    Assessment & Plan  1. Hallucinations  Discussed differential diagnosis as psychiatric or neurological disorder, we will check labs and since MMS was 23 - mild cognitive impairment, refer to  Neurologist to see if Alzheimer's medication would be beneficial, I will add a mood stabilizer today also. She does not want to see a Psychiatrist at this point - POCT urinalysis dipstick - CBC with Differential/Platelet - TSH - Comprehensive metabolic panel - Sedimentation rate - RPR - referral made to neurologist  2. Needs flu shot  - Flu vaccine HIGH DOSE PF (Fluzone High dose)  3. Mild cognitive disorder - refer neurologist   4. Clinical depression  Continue medication for now  5. Benign essential HTN Check TSH level, patient was nervous today, worried about her symptoms - TSH

## 2015-07-02 LAB — COMPREHENSIVE METABOLIC PANEL
ALBUMIN: 4.1 g/dL (ref 3.6–4.8)
ALK PHOS: 68 IU/L (ref 39–117)
ALT: 14 IU/L (ref 0–32)
AST: 18 IU/L (ref 0–40)
Albumin/Globulin Ratio: 1.9 (ref 1.1–2.5)
BUN / CREAT RATIO: 16 (ref 11–26)
BUN: 14 mg/dL (ref 8–27)
Bilirubin Total: 0.4 mg/dL (ref 0.0–1.2)
CALCIUM: 9.7 mg/dL (ref 8.7–10.3)
CO2: 26 mmol/L (ref 18–29)
CREATININE: 0.86 mg/dL (ref 0.57–1.00)
Chloride: 104 mmol/L (ref 97–108)
GFR calc Af Amer: 82 mL/min/{1.73_m2} (ref >59)
GFR, EST NON AFRICAN AMERICAN: 71 mL/min/{1.73_m2} (ref >59)
GLOBULIN, TOTAL: 2.2 g/dL (ref 1.5–4.5)
GLUCOSE: 75 mg/dL (ref 65–99)
Potassium: 4.9 mmol/L (ref 3.5–5.2)
SODIUM: 144 mmol/L (ref 134–144)
TOTAL PROTEIN: 6.3 g/dL (ref 6.0–8.5)

## 2015-07-02 LAB — CBC WITH DIFFERENTIAL/PLATELET
BASOS: 0 %
Basophils Absolute: 0 10*3/uL (ref 0.0–0.2)
EOS (ABSOLUTE): 0.2 10*3/uL (ref 0.0–0.4)
EOS: 2 %
HEMATOCRIT: 29.4 % — AB (ref 34.0–46.6)
HEMOGLOBIN: 9.7 g/dL — AB (ref 11.1–15.9)
IMMATURE GRANS (ABS): 0 10*3/uL (ref 0.0–0.1)
IMMATURE GRANULOCYTES: 0 %
Lymphocytes Absolute: 2.1 10*3/uL (ref 0.7–3.1)
Lymphs: 30 %
MCH: 28.3 pg (ref 26.6–33.0)
MCHC: 33 g/dL (ref 31.5–35.7)
MCV: 86 fL (ref 79–97)
MONOCYTES: 5 %
MONOS ABS: 0.4 10*3/uL (ref 0.1–0.9)
NEUTROS PCT: 63 %
Neutrophils Absolute: 4.2 10*3/uL (ref 1.4–7.0)
Platelets: 228 10*3/uL (ref 150–379)
RBC: 3.43 x10E6/uL — AB (ref 3.77–5.28)
RDW: 15.7 % — AB (ref 12.3–15.4)
WBC: 6.9 10*3/uL (ref 3.4–10.8)

## 2015-07-02 LAB — TSH: TSH: 2.32 u[IU]/mL (ref 0.450–4.500)

## 2015-07-02 LAB — RPR: RPR Ser Ql: NONREACTIVE

## 2015-07-02 LAB — SEDIMENTATION RATE: SED RATE: 6 mm/h (ref 0–40)

## 2015-07-03 ENCOUNTER — Other Ambulatory Visit: Payer: Self-pay | Admitting: Family Medicine

## 2015-07-03 DIAGNOSIS — D649 Anemia, unspecified: Secondary | ICD-10-CM

## 2015-07-04 NOTE — Progress Notes (Signed)
Iron-TIBC, and Ferritin have been added on.

## 2015-07-05 ENCOUNTER — Encounter: Payer: Self-pay | Admitting: Family Medicine

## 2015-07-05 DIAGNOSIS — D638 Anemia in other chronic diseases classified elsewhere: Secondary | ICD-10-CM | POA: Insufficient documentation

## 2015-07-05 LAB — FERRITIN: Ferritin: 150 ng/mL (ref 15–150)

## 2015-07-05 LAB — IRON AND TIBC
IRON: 45 ug/dL (ref 27–139)
Iron Saturation: 15 % (ref 15–55)
TIBC: 298 ug/dL (ref 250–450)
UIBC: 253 ug/dL (ref 118–369)

## 2015-07-05 LAB — SPECIMEN STATUS REPORT

## 2015-07-05 NOTE — Progress Notes (Signed)
Patient notified

## 2015-07-05 NOTE — Progress Notes (Signed)
Patient notified and will pick up [facility] cards today.

## 2015-07-08 ENCOUNTER — Encounter: Payer: Self-pay | Admitting: Family Medicine

## 2015-07-20 ENCOUNTER — Other Ambulatory Visit: Payer: Self-pay | Admitting: Family Medicine

## 2015-07-26 DIAGNOSIS — R440 Auditory hallucinations: Secondary | ICD-10-CM | POA: Diagnosis not present

## 2015-08-08 ENCOUNTER — Ambulatory Visit: Payer: Medicare Other | Admitting: Family Medicine

## 2015-08-10 ENCOUNTER — Encounter: Payer: Self-pay | Admitting: Family Medicine

## 2015-08-10 ENCOUNTER — Ambulatory Visit (INDEPENDENT_AMBULATORY_CARE_PROVIDER_SITE_OTHER): Payer: Medicare Other | Admitting: Family Medicine

## 2015-08-10 VITALS — BP 116/54 | HR 87 | Temp 97.8°F | Resp 16 | Ht 59.0 in | Wt 159.4 lb

## 2015-08-10 DIAGNOSIS — E119 Type 2 diabetes mellitus without complications: Secondary | ICD-10-CM

## 2015-08-10 DIAGNOSIS — E785 Hyperlipidemia, unspecified: Secondary | ICD-10-CM

## 2015-08-10 DIAGNOSIS — I10 Essential (primary) hypertension: Secondary | ICD-10-CM | POA: Diagnosis not present

## 2015-08-10 DIAGNOSIS — F068 Other specified mental disorders due to known physiological condition: Secondary | ICD-10-CM

## 2015-08-10 DIAGNOSIS — F330 Major depressive disorder, recurrent, mild: Secondary | ICD-10-CM

## 2015-08-10 DIAGNOSIS — Z23 Encounter for immunization: Secondary | ICD-10-CM | POA: Diagnosis not present

## 2015-08-10 DIAGNOSIS — R440 Auditory hallucinations: Secondary | ICD-10-CM | POA: Diagnosis not present

## 2015-08-10 DIAGNOSIS — Z1239 Encounter for other screening for malignant neoplasm of breast: Secondary | ICD-10-CM | POA: Diagnosis not present

## 2015-08-10 DIAGNOSIS — F09 Unspecified mental disorder due to known physiological condition: Secondary | ICD-10-CM

## 2015-08-10 LAB — POCT GLYCOSYLATED HEMOGLOBIN (HGB A1C): Hemoglobin A1C: 5.6

## 2015-08-10 LAB — POCT UA - MICROALBUMIN: MICROALBUMIN (UR) POC: 50 mg/L

## 2015-08-10 MED ORDER — QUETIAPINE FUMARATE 25 MG PO TABS: 12.5000 mg | ORAL_TABLET | Freq: Every day | ORAL | Status: DC

## 2015-08-10 MED ORDER — OLMESARTAN MEDOXOMIL 40 MG PO TABS: 20.0000 mg | ORAL_TABLET | Freq: Every day | ORAL | Status: DC

## 2015-08-10 NOTE — Progress Notes (Signed)
Name: Lindsey [name]   MRN: [id number]    DOB: [birth year]   Date:08/10/2015       Progress Note  Subjective  Chief Complaint  Chief Complaint  Patient presents with  . Medication Refill    follow-up  . Diabetes    checks BG 1 x day  . Hypertension  . Hyperlipidemia  . Gout  . Depression    HPI  DM II: patient is taking medication as prescribed, has lost weight, because of lack of appetite, and hgbbA1C is down, we will hold glipizide, consider taking it only before holidays, such as Thanksgiving. She denies polyphagia, polyuria but has some polydipsia. Eye exam is up to date.   Auditory Hallucination: seen by Dr. Melrose Nakayama, and labs normal. On low dose of Seroquel, off Depakote and is having milder symptoms, but still has it a few times weekly ( used to be daily before) she has a follow up with him in a few weeks.   HTN: bp is low, she denies dizziness, we will decrease dose of Benicar to half pill daily and monitor bp at home. No chest pain, no SOB  Hyperlipidemia: taking Crestor and denies side effects of medication  Major Depression Mild: taking medication, gets anxious and irritable at times, sleeping well now. No crying spells.    Patient Active Problem List   Diagnosis Date Noted  . Auditory hallucination 07/26/2015  . Anemia of chronic disease 07/05/2015  . Type 2 diabetes mellitus, controlled, with ophthalmic manifestations, with cataract 04/06/2015  . Cataract 04/06/2015  . Benign essential HTN 04/02/2015  . Controlled gout 04/02/2015  . Dyslipidemia 04/02/2015  . Clinical depression 04/02/2015  . Fibromyalgia syndrome 04/02/2015  . Chronic insomnia 04/02/2015  . Mild cognitive disorder 04/02/2015  . Nodular basal cell carcinoma 04/02/2015  . Morbid obesity ([place]) 04/02/2015  . Osteopenia 04/02/2015  . Calcium blood increased 04/02/2015    Past Surgical History  Procedure Laterality Date  . Cesarean section    . Tonsilectomy, adenoidectomy, bilateral  myringotomy and tubes      Family History  Problem Relation Age of Onset  . Hypertension Mother   . Osteoporosis Mother   . Cancer Mother     brain tumor  . Hypertension Sister   . Diabetes Sister   . Hypertension Sister   . Cancer Sister   . Cancer Sister 11    breast    Social History   Social History  . Marital Status: Divorced    Spouse Name: N/A  . Number of Children: N/A  . Years of Education: N/A   Occupational History  . Not on file.   Social History Main Topics  . Smoking status: Never Smoker   . Smokeless tobacco: Never Used  . Alcohol Use: No  . Drug Use: No  . Sexual Activity: Not Currently   Other Topics Concern  . Not on file   Social History Narrative     Current outpatient prescriptions:  .  allopurinol (ZYLOPRIM) 100 MG tablet, Take 1 tablet (100 mg total) by mouth daily., Disp: 30 tablet, Rfl: 8 .  aspirin 81 MG chewable tablet, Chew 1 tablet by mouth daily., Disp: , Rfl:  .  BLACK COHOSH PO, Take by mouth., Disp: , Rfl:  .  calcium-vitamin D (OSCAL 500/200 D-3) 500-200 MG-UNIT per tablet, Take 1 tablet by mouth daily., Disp: , Rfl:  .  carvedilol (COREG) 25 MG tablet, Take 2 tablets (50 mg total) by mouth daily., Disp: 60 tablet,  Rfl: 8 .  FLUoxetine (PROZAC) 20 MG capsule, Take 1 capsule (20 mg total) by mouth daily., Disp: 30 capsule, Rfl: 8 .  glipiZIDE (GLUCOTROL) 5 MG tablet, Take 0.5 tablets (2.5 mg total) by mouth 2 (two) times daily before a meal., Disp: 30 tablet, Rfl: 8 .  GLUCOSE BLOOD VI, , Disp: , Rfl:  .  MULTIPLE VITAMIN PO, Take 1 tablet by mouth daily., Disp: , Rfl:  .  olmesartan (BENICAR) 40 MG tablet, Take 0.5-1 tablets (20-40 mg total) by mouth daily., Disp: 30 tablet, Rfl: 8 .  QUEtiapine (SEROQUEL) 25 MG tablet, Take 0.5-1 tablets (12.5-25 mg total) by mouth at bedtime., Disp: 30 tablet, Rfl: 0 .  raloxifene (EVISTA) 60 MG tablet, Take 1 tablet by mouth daily., Disp: , Rfl:  .  rosuvastatin (CRESTOR) 20 MG tablet, Take 1  tablet (20 mg total) by mouth daily., Disp: 30 tablet, Rfl: 5 .  SitaGLIPtin-MetFORMIN HCl (JANUMET XR) 50-1000 MG TB24, Take 2 tablets by mouth daily., Disp: 60 tablet, Rfl: 8  Allergies  Allergen Reactions  . Ace Inhibitors      ROS  Constitutional: Negative for fever , mild weight change.  Respiratory: Negative for cough and shortness of breath.   Cardiovascular: Negative for chest pain or palpitations.  Gastrointestinal: Negative for abdominal pain, lose stools - but not watery - likely from high dose Metformin - going on for a long time Musculoskeletal: Negative for gait problem or joint swelling.  Skin: Negative for rash.  Neurological: Negative for dizziness or headache.  No other specific complaints in a complete review of systems (except as listed in HPI above).  Objective  Filed Vitals:   08/10/15 0932  BP: 116/54  Pulse: 87  Temp: 97.8 F (36.6 C)  TempSrc: Oral  Resp: 16  Height: 4\' 11"  (1.499 m)  Weight: 159 lb 6.4 oz (72.303 kg)  SpO2: 97%    Body mass index is 32.18 kg/(m^2).  Physical Exam  Constitutional: Patient appears well-developed and well-nourished. Obese No distress.  HEENT: head atraumatic, normocephalic, pupils equal and reactive to light,  neck supple, throat within normal limits Cardiovascular: Normal rate, regular rhythm and normal heart sounds.  No murmur heard. No BLE edema. Pulmonary/Chest: Effort normal and breath sounds normal. No respiratory distress. Abdominal: Soft.  There is no tenderness. Psychiatric: Patient has a normal mood and affect. behavior is normal. Judgment and thought content normal.  Recent Results (from the past 2160 hour(s))  Urine culture     Status: None   Collection Time: 06/23/15  5:50 PM  Result Value Ref Range   Specimen Description URINE, CLEAN CATCH    Special Requests none Immunocompromised    Culture 4,000 COLONIES/mL INSIGNIFICANT GROWTH    Report Status 06/28/2015 FINAL   Urinalysis complete, with  microscopic     Status: Abnormal   Collection Time: 06/24/15  5:50 PM  Result Value Ref Range   Color, Urine STRAW (A) YELLOW   APPearance CLEAR CLEAR   Glucose, UA NEGATIVE NEGATIVE mg/dL   Bilirubin Urine NEGATIVE NEGATIVE   Ketones, ur NEGATIVE NEGATIVE mg/dL   Specific Gravity, Urine 1.010 1.005 - 1.030   Hgb urine dipstick NEGATIVE NEGATIVE   pH 5.0 5.0 - 8.0   Protein, ur NEGATIVE NEGATIVE mg/dL   Nitrite NEGATIVE NEGATIVE   Leukocytes, UA NEGATIVE NEGATIVE   RBC / HPF 0-5 <3 RBC/hpf   WBC, UA 0-5 <3 WBC/hpf   Bacteria, UA NONE SEEN (A) RARE   Squamous Epithelial /  LPF 6-30 (A) RARE  POCT urinalysis dipstick     Status: Normal   Collection Time: 07/01/15 11:00 AM  Result Value Ref Range   Color, UA yellow    Clarity, UA clear    Glucose, UA neg    Bilirubin, UA neg    Ketones, UA neg    Spec Grav, UA 1.020    Blood, UA neg    pH, UA 5.5    Protein, UA neg    Urobilinogen, UA 0.2    Nitrite, UA neg    Leukocytes, UA Trace (A) Negative  CBC with Differential/Platelet     Status: Abnormal   Collection Time: 07/01/15 12:02 PM  Result Value Ref Range   WBC 6.9 3.4 - 10.8 x10E3/uL   RBC 3.43 (L) 3.77 - 5.28 x10E6/uL   Hemoglobin 9.7 (L) 11.1 - 15.9 g/dL   Hematocrit 29.4 (L) 34.0 - 46.6 %   MCV 86 79 - 97 fL   MCH 28.3 26.6 - 33.0 pg   MCHC 33.0 31.5 - 35.7 g/dL   RDW 15.7 (H) 12.3 - 15.4 %   Platelets 228 150 - 379 x10E3/uL   Neutrophils 63 %   Lymphs 30 %   Monocytes 5 %   Eos 2 %   Basos 0 %   Neutrophils Absolute 4.2 1.4 - 7.0 x10E3/uL   Lymphocytes Absolute 2.1 0.7 - 3.1 x10E3/uL   Monocytes Absolute 0.4 0.1 - 0.9 x10E3/uL   EOS (ABSOLUTE) 0.2 0.0 - 0.4 x10E3/uL   Basophils Absolute 0.0 0.0 - 0.2 x10E3/uL   Immature Granulocytes 0 %   Immature Grans (Abs) 0.0 0.0 - 0.1 x10E3/uL  TSH     Status: None   Collection Time: 07/01/15 12:02 PM  Result Value Ref Range   TSH 2.320 0.450 - 4.500 uIU/mL  Comprehensive metabolic panel     Status: None    Collection Time: 07/01/15 12:02 PM  Result Value Ref Range   Glucose 75 65 - 99 mg/dL   BUN 14 8 - 27 mg/dL   Creatinine, Ser 0.86 0.57 - 1.00 mg/dL   GFR calc non Af Amer 71 >59 mL/min/1.73   GFR calc Af Amer 82 >59 mL/min/1.73   BUN/Creatinine Ratio 16 11 - 26   Sodium 144 134 - 144 mmol/L   Potassium 4.9 3.5 - 5.2 mmol/L   Chloride 104 97 - 108 mmol/L   CO2 26 18 - 29 mmol/L   Calcium 9.7 8.7 - 10.3 mg/dL   Total Protein 6.3 6.0 - 8.5 g/dL   Albumin 4.1 3.6 - 4.8 g/dL   Globulin, Total 2.2 1.5 - 4.5 g/dL   Albumin/Globulin Ratio 1.9 1.1 - 2.5   Bilirubin Total 0.4 0.0 - 1.2 mg/dL   Alkaline Phosphatase 68 39 - 117 IU/L   AST 18 0 - 40 IU/L   ALT 14 0 - 32 IU/L  Sedimentation rate     Status: None   Collection Time: 07/01/15 12:02 PM  Result Value Ref Range   Sed Rate 6 0 - 40 mm/hr  RPR     Status: None   Collection Time: 07/01/15 12:02 PM  Result Value Ref Range   RPR Ser Ql Non Reactive Non Reactive  Iron and TIBC     Status: None   Collection Time: 07/01/15 12:02 PM  Result Value Ref Range   Total Iron Binding Capacity 298 250 - 450 ug/dL   UIBC 253 118 - 369 ug/dL   Iron 45 27 -  139 ug/dL   Iron Saturation 15 15 - 55 %  Ferritin     Status: None   Collection Time: 07/01/15 12:02 PM  Result Value Ref Range   Ferritin 150 15 - 150 ng/mL  Specimen status report     Status: None   Collection Time: 07/01/15 12:02 PM  Result Value Ref Range   specimen status report Comment     Comment: Written Authorization Written Authorization Written Authorization Received. Authorization received from [facility] 07-05-2015 Logged by Guadalupe Maple   POCT UA - Microalbumin     Status: Abnormal   Collection Time: 08/10/15  9:36 AM  Result Value Ref Range   Microalbumin Ur, POC 50 mg/L   Creatinine, POC  mg/dL   Albumin/Creatinine Ratio, Urine, POC    POCT HgB A1C     Status: Normal   Collection Time: 08/10/15  9:37 AM  Result Value Ref Range   Hemoglobin A1C 5.6      PHQ2/9: Depression screen [facility] 2/9 08/10/2015 07/01/2015 04/06/2015  Decreased Interest 0 0 0  Down, Depressed, Hopeless 0 0 0  PHQ - 2 Score 0 0 0     Fall Risk: Fall Risk  08/10/2015 07/01/2015 04/06/2015  Falls in the past year? No No Yes  Number falls in past yr: - - 1  Injury with Fall? - - No    Functional Status Survey: Is the patient deaf or have difficulty hearing?: No Does the patient have difficulty seeing, even when wearing glasses/contacts?: Yes (glasses) Does the patient have difficulty concentrating, remembering, or making decisions?: No Does the patient have difficulty walking or climbing stairs?: No Does the patient have difficulty dressing or bathing?: No Does the patient have difficulty doing errands alone such as visiting a doctor's office or shopping?: No    Assessment & Plan  1. Type 2 diabetes mellitus without complication, without long-term current use of insulin (HCC)  Stop Glipizide, continue Janumet XR - POCT UA - Microalbumin - POCT HgB A1C  2. Needs flu shot  - Flu vaccine HIGH DOSE PF (Fluzone High dose)  3. Auditory hallucination  Continue follow up with Dr. Melrose Nakayama - QUEtiapine (SEROQUEL) 25 MG tablet; Take 0.5-1 tablets (12.5-25 mg total) by mouth at bedtime.  Dispense: 30 tablet; Refill: 0  4. Mild cognitive disorder  Last MMS 23/30  5. Benign essential HTN  Decrease dose to half pill daily and monitor bp at home - olmesartan (BENICAR) 40 MG tablet; Take 0.5-1 tablets (20-40 mg total) by mouth daily.  Dispense: 30 tablet; Refill: 8  6. Dyslipidemia  Continue Crestor  7. Breast cancer screening  - MM Digital Screening; Future

## 2015-08-31 DIAGNOSIS — R440 Auditory hallucinations: Secondary | ICD-10-CM | POA: Diagnosis not present

## 2015-08-31 DIAGNOSIS — R441 Visual hallucinations: Secondary | ICD-10-CM | POA: Diagnosis not present

## 2015-09-05 ENCOUNTER — Other Ambulatory Visit: Payer: Self-pay

## 2015-09-07 ENCOUNTER — Encounter
Admission: RE | Admit: 2015-09-07 | Discharge: 2015-09-07 | Disposition: A | Discharge status: Home / Self Care | Payer: Medicare Other | Source: Ambulatory Visit | Attending: Internal Medicine | Admitting: Internal Medicine

## 2015-09-07 MED ORDER — RALOXIFENE HCL 60 MG PO TABS: 60.0000 mg | ORAL_TABLET | Freq: Every day | ORAL | Status: DC

## 2015-09-12 ENCOUNTER — Telehealth: Payer: Self-pay | Admitting: Family Medicine

## 2015-09-12 NOTE — Telephone Encounter (Signed)
Dr Ernst Bowler from [facility] requesting return call [phone]

## 2015-09-13 DIAGNOSIS — I10 Essential (primary) hypertension: Secondary | ICD-10-CM | POA: Diagnosis present

## 2015-09-13 DIAGNOSIS — F329 Major depressive disorder, single episode, unspecified: Secondary | ICD-10-CM | POA: Diagnosis present

## 2015-09-13 DIAGNOSIS — R4182 Altered mental status, unspecified: Secondary | ICD-10-CM | POA: Diagnosis present

## 2015-09-13 DIAGNOSIS — E119 Type 2 diabetes mellitus without complications: Secondary | ICD-10-CM | POA: Diagnosis not present

## 2015-09-13 DIAGNOSIS — E785 Hyperlipidemia, unspecified: Secondary | ICD-10-CM | POA: Diagnosis present

## 2015-09-13 DIAGNOSIS — R413 Other amnesia: Secondary | ICD-10-CM | POA: Diagnosis present

## 2015-09-13 DIAGNOSIS — R440 Auditory hallucinations: Secondary | ICD-10-CM | POA: Diagnosis present

## 2015-09-13 DIAGNOSIS — E1136 Type 2 diabetes mellitus with diabetic cataract: Secondary | ICD-10-CM | POA: Diagnosis present

## 2015-09-13 DIAGNOSIS — Z85828 Personal history of other malignant neoplasm of skin: Secondary | ICD-10-CM | POA: Diagnosis not present

## 2015-09-13 DIAGNOSIS — R441 Visual hallucinations: Secondary | ICD-10-CM | POA: Diagnosis not present

## 2015-09-13 DIAGNOSIS — R4189 Other symptoms and signs involving cognitive functions and awareness: Secondary | ICD-10-CM | POA: Diagnosis not present

## 2015-09-13 DIAGNOSIS — M8580 Other specified disorders of bone density and structure, unspecified site: Secondary | ICD-10-CM | POA: Diagnosis present

## 2015-09-13 DIAGNOSIS — F0390 Unspecified dementia without behavioral disturbance: Secondary | ICD-10-CM | POA: Diagnosis present

## 2015-09-13 DIAGNOSIS — M797 Fibromyalgia: Secondary | ICD-10-CM | POA: Diagnosis not present

## 2015-09-13 NOTE — Telephone Encounter (Signed)
Called but no answer, sent him a text to call me back.

## 2015-09-26 DIAGNOSIS — E11319 Type 2 diabetes mellitus with unspecified diabetic retinopathy without macular edema: Secondary | ICD-10-CM | POA: Diagnosis not present

## 2015-09-30 ENCOUNTER — Ambulatory Visit (INDEPENDENT_AMBULATORY_CARE_PROVIDER_SITE_OTHER): Payer: Medicare Other | Admitting: Family Medicine

## 2015-09-30 ENCOUNTER — Encounter: Payer: Self-pay | Admitting: Family Medicine

## 2015-09-30 VITALS — BP 116/72 | HR 80 | Temp 98.0°F | Resp 16 | Ht 59.0 in | Wt 161.6 lb

## 2015-09-30 DIAGNOSIS — R440 Auditory hallucinations: Secondary | ICD-10-CM

## 2015-09-30 DIAGNOSIS — R4189 Other symptoms and signs involving cognitive functions and awareness: Secondary | ICD-10-CM

## 2015-09-30 NOTE — Progress Notes (Signed)
Name: Lindsey [name]   MRN: [id number]    DOB: [birth year]   Date:09/30/2015       Progress Note  Subjective  Chief Complaint  Chief Complaint  Patient presents with  . Hospitalization Follow-up    Went in for Hallucinations, admitted for 2 days and was dx with dementia  . Dementia    was started on aricept and increased dose of seroquel.  Patients daughter states slight improvement.    HPI  Cognitive Decline possible Dementia: she was admitted to [facility] for evaluation of auditory hallucinations. MRI showed atrophy, normal labs and she was advised to have Speech therapy and follow up with Geriatrics clinic. She is taking Aricept and Seroquel. Sleep soundly while at daughter's house, but has hallucinations while at home.  She is compliant with medications.    Patient Active Problem List   Diagnosis Date Noted  . Auditory hallucination 07/26/2015  . Anemia of chronic disease 07/05/2015  . Type 2 diabetes mellitus, controlled, with ophthalmic manifestations, with cataract 04/06/2015  . Cataract 04/06/2015  . Benign essential HTN 04/02/2015  . Controlled gout 04/02/2015  . Dyslipidemia 04/02/2015  . Depression, major, recurrent, mild ([place]) 04/02/2015  . Fibromyalgia syndrome 04/02/2015  . Chronic insomnia 04/02/2015  . Mild cognitive disorder 04/02/2015  . Nodular basal cell carcinoma 04/02/2015  . Morbid obesity ([place]) 04/02/2015  . Osteopenia 04/02/2015  . Calcium blood increased 04/02/2015    Past Surgical History  Procedure Laterality Date  . Cesarean section    . Tonsilectomy, adenoidectomy, bilateral myringotomy and tubes      Family History  Problem Relation Age of Onset  . Hypertension Mother   . Osteoporosis Mother   . Cancer Mother     brain tumor  . Hypertension Sister   . Diabetes Sister   . Hypertension Sister   . Cancer Sister   . Cancer Sister 65    breast    Social History   Social History  . Marital Status: Divorced    Spouse Name: N/A  . Number  of Children: N/A  . Years of Education: N/A   Occupational History  . Not on file.   Social History Main Topics  . Smoking status: Never Smoker   . Smokeless tobacco: Never Used  . Alcohol Use: No  . Drug Use: No  . Sexual Activity: Not Currently   Other Topics Concern  . Not on file   Social History Narrative     Current outpatient prescriptions:  .  donepezil (ARICEPT) 5 MG tablet, Take 5 mg by mouth at bedtime., Disp: , Rfl:  .  QUEtiapine (SEROQUEL) 50 MG tablet, Take 50 mg by mouth at bedtime., Disp: , Rfl:  .  SitaGLIPtin-MetFORMIN HCl (JANUMET XR) 50-1000 MG TB24, Take 1 tablet by mouth., Disp: , Rfl:  .  aspirin 81 MG chewable tablet, Chew 1 tablet by mouth daily., Disp: , Rfl:  .  BLACK COHOSH PO, Take by mouth., Disp: , Rfl:  .  calcium-vitamin D (OSCAL 500/200 D-3) 500-200 MG-UNIT per tablet, Take 1 tablet by mouth daily., Disp: , Rfl:  .  carvedilol (COREG) 25 MG tablet, Take 2 tablets (50 mg total) by mouth daily., Disp: 60 tablet, Rfl: 8 .  FLUoxetine (PROZAC) 20 MG capsule, Take 1 capsule (20 mg total) by mouth daily., Disp: 30 capsule, Rfl: 8 .  GLUCOSE BLOOD VI, , Disp: , Rfl:  .  MULTIPLE VITAMIN PO, Take 1 tablet by mouth daily., Disp: , Rfl:  .  olmesartan (BENICAR) 40 MG tablet, Take 0.5-1 tablets (20-40 mg total) by mouth daily., Disp: 30 tablet, Rfl: 8 .  raloxifene (EVISTA) 60 MG tablet, Take 1 tablet (60 mg total) by mouth daily., Disp: 30 tablet, Rfl: 12 .  rosuvastatin (CRESTOR) 20 MG tablet, Take 1 tablet (20 mg total) by mouth daily., Disp: 30 tablet, Rfl: 5  Allergies  Allergen Reactions  . Ace Inhibitors      ROS  Constitutional: Negative for fever or weight change.  Respiratory:Mild dry cough no  shortness of breath.   Cardiovascular: Negative for chest pain or palpitations.  Gastrointestinal: Negative for abdominal pain, no bowel changes.  Musculoskeletal: Negative for gait problem or joint swelling.  Skin: Negative for rash.   Neurological: Negative for dizziness or headache.  No other specific complaints in a complete review of systems (except as listed in HPI above). Objective  Filed Vitals:   09/30/15 1101  BP: 116/72  Pulse: 80  Temp: 98 F (36.7 C)  TempSrc: Oral  Resp: 16  Height: 4\' 11"  (1.499 m)  Weight: 161 lb 9.6 oz (73.301 kg)  SpO2: 97%    Body mass index is 32.62 kg/(m^2).  Physical Exam  Constitutional: Patient appears well-developed and well-nourished. Obese No distress.  HEENT: head atraumatic, normocephalic, pupils equal and reactive to light, neck supple, throat within normal limits Cardiovascular: Normal rate, regular rhythm and normal heart sounds.  No murmur heard. No BLE edema. Pulmonary/Chest: Effort normal and breath sounds normal. No respiratory distress. Abdominal: Soft.  There is no tenderness. Psychiatric: Patient has a normal mood and affect. behavior is normal.   Recent Results (from the past 2160 hour(s))  POCT UA - Microalbumin     Status: Abnormal   Collection Time: 08/10/15  9:36 AM  Result Value Ref Range   Microalbumin Ur, POC 50 mg/L   Creatinine, POC  mg/dL   Albumin/Creatinine Ratio, Urine, POC    POCT HgB A1C     Status: Normal   Collection Time: 08/10/15  9:37 AM  Result Value Ref Range   Hemoglobin A1C 5.6     PHQ2/9: Depression screen [facility] 2/9 08/10/2015 07/01/2015 04/06/2015  Decreased Interest 0 0 0  Down, Depressed, Hopeless 0 0 0  PHQ - 2 Score 0 0 0     Fall Risk: Fall Risk  08/10/2015 07/01/2015 04/06/2015  Falls in the past year? No No Yes  Number falls in past yr: - - 1  Injury with Fall? - - No    Assessment & Plan  1. Cognitive decline  - Ambulatory referral to Home Health  2. Auditory hallucination  - Ambulatory referral to [place]

## 2015-10-17 DIAGNOSIS — M797 Fibromyalgia: Secondary | ICD-10-CM | POA: Diagnosis not present

## 2015-10-17 DIAGNOSIS — Z9181 History of falling: Secondary | ICD-10-CM | POA: Diagnosis not present

## 2015-10-17 DIAGNOSIS — D649 Anemia, unspecified: Secondary | ICD-10-CM | POA: Diagnosis not present

## 2015-10-17 DIAGNOSIS — M109 Gout, unspecified: Secondary | ICD-10-CM | POA: Diagnosis not present

## 2015-10-17 DIAGNOSIS — I10 Essential (primary) hypertension: Secondary | ICD-10-CM | POA: Diagnosis not present

## 2015-10-17 DIAGNOSIS — M8580 Other specified disorders of bone density and structure, unspecified site: Secondary | ICD-10-CM | POA: Diagnosis not present

## 2015-10-17 DIAGNOSIS — F330 Major depressive disorder, recurrent, mild: Secondary | ICD-10-CM | POA: Diagnosis not present

## 2015-10-17 DIAGNOSIS — E1136 Type 2 diabetes mellitus with diabetic cataract: Secondary | ICD-10-CM | POA: Diagnosis not present

## 2015-10-17 DIAGNOSIS — C4491 Basal cell carcinoma of skin, unspecified: Secondary | ICD-10-CM | POA: Diagnosis not present

## 2015-10-17 DIAGNOSIS — F0391 Unspecified dementia with behavioral disturbance: Secondary | ICD-10-CM | POA: Diagnosis not present

## 2015-10-26 ENCOUNTER — Other Ambulatory Visit: Payer: Self-pay

## 2015-10-26 DIAGNOSIS — I10 Essential (primary) hypertension: Secondary | ICD-10-CM | POA: Diagnosis not present

## 2015-10-26 DIAGNOSIS — E1136 Type 2 diabetes mellitus with diabetic cataract: Secondary | ICD-10-CM | POA: Diagnosis not present

## 2015-10-26 DIAGNOSIS — C4491 Basal cell carcinoma of skin, unspecified: Secondary | ICD-10-CM | POA: Diagnosis not present

## 2015-10-26 DIAGNOSIS — F330 Major depressive disorder, recurrent, mild: Secondary | ICD-10-CM | POA: Diagnosis not present

## 2015-10-26 DIAGNOSIS — F0391 Unspecified dementia with behavioral disturbance: Secondary | ICD-10-CM | POA: Diagnosis not present

## 2015-10-26 DIAGNOSIS — M797 Fibromyalgia: Secondary | ICD-10-CM | POA: Diagnosis not present

## 2015-10-26 NOTE — Telephone Encounter (Signed)
Got a fax from [place] ([address]) stating that this patient needs a refill of her Seroquel 25mg . Our records states she is to take  1 pill nightly but after she was seen at [facility] ER they increased it to 2 tabs nightly. They need to know which one is she suppose to be on.  Dementia was started on aricept and increased dose of seroquel. Patients daughter states slight improvement.  Refill request was sent to Dr. Steele Sizer for approval and submission.

## 2015-10-27 MED ORDER — QUETIAPINE FUMARATE 50 MG PO TABS: 50.0000 mg | ORAL_TABLET | Freq: Every day | ORAL | Status: DC

## 2015-11-01 DIAGNOSIS — R441 Visual hallucinations: Secondary | ICD-10-CM | POA: Diagnosis not present

## 2015-11-01 DIAGNOSIS — F0390 Unspecified dementia without behavioral disturbance: Secondary | ICD-10-CM | POA: Insufficient documentation

## 2015-11-01 DIAGNOSIS — R440 Auditory hallucinations: Secondary | ICD-10-CM | POA: Diagnosis not present

## 2015-11-01 DIAGNOSIS — F03A0 Unspecified dementia, mild, without behavioral disturbance, psychotic disturbance, mood disturbance, and anxiety: Secondary | ICD-10-CM | POA: Insufficient documentation

## 2015-11-02 DIAGNOSIS — C4491 Basal cell carcinoma of skin, unspecified: Secondary | ICD-10-CM | POA: Diagnosis not present

## 2015-11-02 DIAGNOSIS — E1136 Type 2 diabetes mellitus with diabetic cataract: Secondary | ICD-10-CM | POA: Diagnosis not present

## 2015-11-02 DIAGNOSIS — F0391 Unspecified dementia with behavioral disturbance: Secondary | ICD-10-CM | POA: Diagnosis not present

## 2015-11-02 DIAGNOSIS — F330 Major depressive disorder, recurrent, mild: Secondary | ICD-10-CM | POA: Diagnosis not present

## 2015-11-02 DIAGNOSIS — M797 Fibromyalgia: Secondary | ICD-10-CM | POA: Diagnosis not present

## 2015-11-02 DIAGNOSIS — I10 Essential (primary) hypertension: Secondary | ICD-10-CM | POA: Diagnosis not present

## 2015-11-22 DIAGNOSIS — F330 Major depressive disorder, recurrent, mild: Secondary | ICD-10-CM | POA: Diagnosis not present

## 2015-11-22 DIAGNOSIS — I10 Essential (primary) hypertension: Secondary | ICD-10-CM | POA: Diagnosis not present

## 2015-11-22 DIAGNOSIS — F0391 Unspecified dementia with behavioral disturbance: Secondary | ICD-10-CM | POA: Diagnosis not present

## 2015-11-22 DIAGNOSIS — E1136 Type 2 diabetes mellitus with diabetic cataract: Secondary | ICD-10-CM | POA: Diagnosis not present

## 2015-11-22 DIAGNOSIS — M797 Fibromyalgia: Secondary | ICD-10-CM | POA: Diagnosis not present

## 2015-11-22 DIAGNOSIS — C4491 Basal cell carcinoma of skin, unspecified: Secondary | ICD-10-CM | POA: Diagnosis not present

## 2015-11-29 DIAGNOSIS — F0391 Unspecified dementia with behavioral disturbance: Secondary | ICD-10-CM | POA: Diagnosis not present

## 2015-11-29 DIAGNOSIS — I10 Essential (primary) hypertension: Secondary | ICD-10-CM | POA: Diagnosis not present

## 2015-11-29 DIAGNOSIS — E1136 Type 2 diabetes mellitus with diabetic cataract: Secondary | ICD-10-CM | POA: Diagnosis not present

## 2015-11-29 DIAGNOSIS — C4491 Basal cell carcinoma of skin, unspecified: Secondary | ICD-10-CM | POA: Diagnosis not present

## 2015-11-29 DIAGNOSIS — M797 Fibromyalgia: Secondary | ICD-10-CM | POA: Diagnosis not present

## 2015-11-29 DIAGNOSIS — F330 Major depressive disorder, recurrent, mild: Secondary | ICD-10-CM | POA: Diagnosis not present

## 2015-12-06 DIAGNOSIS — I10 Essential (primary) hypertension: Secondary | ICD-10-CM | POA: Diagnosis not present

## 2015-12-06 DIAGNOSIS — F0391 Unspecified dementia with behavioral disturbance: Secondary | ICD-10-CM | POA: Diagnosis not present

## 2015-12-06 DIAGNOSIS — M797 Fibromyalgia: Secondary | ICD-10-CM | POA: Diagnosis not present

## 2015-12-06 DIAGNOSIS — C4491 Basal cell carcinoma of skin, unspecified: Secondary | ICD-10-CM | POA: Diagnosis not present

## 2015-12-06 DIAGNOSIS — F330 Major depressive disorder, recurrent, mild: Secondary | ICD-10-CM | POA: Diagnosis not present

## 2015-12-06 DIAGNOSIS — E1136 Type 2 diabetes mellitus with diabetic cataract: Secondary | ICD-10-CM | POA: Diagnosis not present

## 2015-12-12 ENCOUNTER — Encounter: Payer: Self-pay | Admitting: Family Medicine

## 2015-12-12 ENCOUNTER — Ambulatory Visit (INDEPENDENT_AMBULATORY_CARE_PROVIDER_SITE_OTHER): Payer: Medicare Other | Admitting: Family Medicine

## 2015-12-12 VITALS — BP 118/76 | HR 88 | Temp 98.2°F | Resp 16 | Ht 59.0 in | Wt 160.6 lb

## 2015-12-12 DIAGNOSIS — F03A0 Unspecified dementia, mild, without behavioral disturbance, psychotic disturbance, mood disturbance, and anxiety: Secondary | ICD-10-CM

## 2015-12-12 DIAGNOSIS — I10 Essential (primary) hypertension: Secondary | ICD-10-CM

## 2015-12-12 DIAGNOSIS — D638 Anemia in other chronic diseases classified elsewhere: Secondary | ICD-10-CM

## 2015-12-12 DIAGNOSIS — Z79899 Other long term (current) drug therapy: Secondary | ICD-10-CM | POA: Diagnosis not present

## 2015-12-12 DIAGNOSIS — E785 Hyperlipidemia, unspecified: Secondary | ICD-10-CM | POA: Diagnosis not present

## 2015-12-12 DIAGNOSIS — E1136 Type 2 diabetes mellitus with diabetic cataract: Secondary | ICD-10-CM

## 2015-12-12 DIAGNOSIS — E1165 Type 2 diabetes mellitus with hyperglycemia: Secondary | ICD-10-CM

## 2015-12-12 DIAGNOSIS — F0390 Unspecified dementia without behavioral disturbance: Secondary | ICD-10-CM

## 2015-12-12 DIAGNOSIS — R809 Proteinuria, unspecified: Secondary | ICD-10-CM

## 2015-12-12 DIAGNOSIS — IMO0002 Reserved for concepts with insufficient information to code with codable children: Secondary | ICD-10-CM

## 2015-12-12 DIAGNOSIS — M109 Gout, unspecified: Secondary | ICD-10-CM

## 2015-12-12 DIAGNOSIS — Z1159 Encounter for screening for other viral diseases: Secondary | ICD-10-CM

## 2015-12-12 DIAGNOSIS — E1129 Type 2 diabetes mellitus with other diabetic kidney complication: Secondary | ICD-10-CM

## 2015-12-12 DIAGNOSIS — F330 Major depressive disorder, recurrent, mild: Secondary | ICD-10-CM | POA: Diagnosis not present

## 2015-12-12 LAB — POCT GLYCOSYLATED HEMOGLOBIN (HGB A1C): HEMOGLOBIN A1C: 7.1

## 2015-12-12 LAB — GLUCOSE, POCT (MANUAL RESULT ENTRY): POC GLUCOSE: 174 mg/dL — AB (ref 70–99)

## 2015-12-12 MED ORDER — FLUOXETINE HCL 20 MG PO CAPS: 20.0000 mg | ORAL_CAPSULE | Freq: Every day | ORAL | Status: DC

## 2015-12-12 MED ORDER — OLMESARTAN MEDOXOMIL 20 MG PO TABS: 20.0000 mg | ORAL_TABLET | Freq: Every day | ORAL | Status: DC

## 2015-12-12 MED ORDER — CARVEDILOL 25 MG PO TABS: 50.0000 mg | ORAL_TABLET | Freq: Every day | ORAL | Status: DC

## 2015-12-12 MED ORDER — ROSUVASTATIN CALCIUM 20 MG PO TABS: 20.0000 mg | ORAL_TABLET | Freq: Every day | ORAL | Status: DC

## 2015-12-12 MED ORDER — SITAGLIP PHOS-METFORMIN HCL ER 50-1000 MG PO TB24: 2.0000 | ORAL_TABLET | Freq: Every day | ORAL | Status: DC

## 2015-12-12 NOTE — Progress Notes (Signed)
Name: Lindsey [name]   MRN: [id number]    DOB: 02-06-1951   Date:12/12/2015       Progress Note  Subjective  Chief Complaint  Chief Complaint  Patient presents with  . Depression    4 month follow up  . Hypertension  . Diabetes  . Hyperlipidemia    HPI   DM II: patient is taking medication as prescribed, has lost weight, appetite is good now, and hgbbA1C is up ( above goal at 7.1% ), we stopped Glipizide on your last visit because of hypoglycemia, advised to focus on her diet . She denies polyphagia, polyuria but has some polydipsia. Advised importance of eye exam yearly  Auditory Hallucination: seen by Dr. Melrose Nakayama, and labs normal. On low dose of Seroquel, off Depakote and is having milder symptoms, but still has it at most a couple of  times weekly ( used to be daily before) she has a follow up with him in a few weeks.   HTN: bp is low, she denies dizziness, we will decreased dose of Benicar to half pill daily on her last visit.  No chest pain, no SOB  Hyperlipidemia: taking Crestor and denies side effects of medication, no myalgia.   Major Depression Mild: taking medication, gets anxious and irritable at times, sleeping well now. No crying spells. Feeling much better now .  Mild Dementia: seen by Dr. Melrose Nakayama, had some home cognitive evaluation, and states scores have improved. Less hallucinations, taking medications as prescribed. She still lives alone, daughter is managing her bills, doctors visits, and dispense her medications  Patient Active Problem List   Diagnosis Date Noted  . Uncontrolled type 2 diabetes mellitus with microalbuminuria, without long-term current use of insulin ([place]) 12/12/2015  . Mild dementia 11/01/2015  . Auditory hallucination 07/26/2015  . Anemia of chronic disease 07/05/2015  . Poorly controlled type 2 diabetes mellitus with cataract ([place]) 04/06/2015  . Cataract 04/06/2015  . Benign essential HTN 04/02/2015  . Controlled gout 04/02/2015  .  Dyslipidemia 04/02/2015  . Depression, major, recurrent, mild ([place]) 04/02/2015  . Fibromyalgia syndrome 04/02/2015  . Chronic insomnia 04/02/2015  . Cognitive decline 04/02/2015  . Nodular basal cell carcinoma 04/02/2015  . Morbid obesity ([place]) 04/02/2015  . Osteopenia 04/02/2015  . Calcium blood increased 04/02/2015    Past Surgical History  Procedure Laterality Date  . Cesarean section    . Tonsilectomy, adenoidectomy, bilateral myringotomy and tubes      Family History  Problem Relation Age of Onset  . Hypertension Mother   . Osteoporosis Mother   . Cancer Mother     brain tumor  . Hypertension Sister   . Diabetes Sister   . Hypertension Sister   . Cancer Sister   . Cancer Sister 70    breast    Social History   Social History  . Marital Status: Divorced    Spouse Name: N/A  . Number of Children: N/A  . Years of Education: N/A   Occupational History  . Not on file.   Social History Main Topics  . Smoking status: Never Smoker   . Smokeless tobacco: Never Used  . Alcohol Use: No  . Drug Use: No  . Sexual Activity: Not Currently   Other Topics Concern  . Not on file   Social History Narrative     Current outpatient prescriptions:  .  aspirin 81 MG chewable tablet, Chew 1 tablet by mouth daily., Disp: , Rfl:  .  BLACK COHOSH PO,  Take by mouth., Disp: , Rfl:  .  calcium-vitamin D (OSCAL 500/200 D-3) 500-200 MG-UNIT per tablet, Take 1 tablet by mouth daily., Disp: , Rfl:  .  carvedilol (COREG) 25 MG tablet, Take 2 tablets (50 mg total) by mouth daily., Disp: 60 tablet, Rfl: 8 .  donepezil (ARICEPT) 5 MG tablet, Take 5 mg by mouth at bedtime., Disp: , Rfl:  .  FLUoxetine (PROZAC) 20 MG capsule, Take 1 capsule (20 mg total) by mouth daily., Disp: 30 capsule, Rfl: 8 .  GLUCOSE BLOOD VI, , Disp: , Rfl:  .  MULTIPLE VITAMIN PO, Take 1 tablet by mouth daily., Disp: , Rfl:  .  olmesartan (BENICAR) 20 MG tablet, Take 1 tablet (20 mg total) by mouth daily., Disp:  30 tablet, Rfl: 8 .  QUEtiapine (SEROQUEL) 50 MG tablet, Take 1 tablet (50 mg total) by mouth at bedtime., Disp: 90 tablet, Rfl: 1 .  raloxifene (EVISTA) 60 MG tablet, Take 1 tablet (60 mg total) by mouth daily., Disp: 30 tablet, Rfl: 12 .  rosuvastatin (CRESTOR) 20 MG tablet, Take 1 tablet (20 mg total) by mouth daily., Disp: 30 tablet, Rfl: 8 .  SitaGLIPtin-MetFORMIN HCl (JANUMET XR) 50-1000 MG TB24, Take 2 tablets by mouth daily., Disp: 60 tablet, Rfl: 3  Allergies  Allergen Reactions  . Ace Inhibitors      ROS  Constitutional: Negative for fever or weight change.  Respiratory: Negative for cough and shortness of breath.   Cardiovascular: Negative for chest pain or palpitations.  Gastrointestinal: Negative for abdominal pain, no bowel changes.  Musculoskeletal: Negative for gait problem or joint swelling.  Skin: Negative for rash.  Neurological: Negative for dizziness or headache.  No other specific complaints in a complete review of systems (except as listed in HPI above).  Objective  Filed Vitals:   12/12/15 1328  BP: 118/76  Pulse: 88  Temp: 98.2 F (36.8 C)  TempSrc: Oral  Resp: 16  Height: 4\' 11"  (1.499 m)  Weight: 160 lb 9.6 oz (72.848 kg)  SpO2: 96%    Body mass index is 32.42 kg/(m^2).  Physical Exam  Constitutional: Patient appears well-developed and well-nourished. Obese  No distress.  HEENT: head atraumatic, normocephalic, pupils equal and reactive to light,  neck supple, throat within normal limits Cardiovascular: Normal rate, regular rhythm and normal heart sounds.  No murmur heard. No BLE edema. Pulmonary/Chest: Effort normal and breath sounds normal. No respiratory distress. Abdominal: Soft.  There is no tenderness. Psychiatric: Patient has a normal mood and affect. behavior is normal. Judgment and thought content normal. Neurological : grossly intact, she came in by herself able to give me the necessary information  Recent Results (from the past  2160 hour(s))  POCT Glucose (CBG)     Status: Abnormal   Collection Time: 12/12/15  1:35 PM  Result Value Ref Range   POC Glucose 174 (A) 70 - 99 mg/dl  POCT HgB A1C     Status: None   Collection Time: 12/12/15  1:41 PM  Result Value Ref Range   Hemoglobin A1C 7.1      PHQ2/9: Depression screen [facility] 2/9 12/12/2015 08/10/2015 07/01/2015 04/06/2015  Decreased Interest 0 0 0 0  Down, Depressed, Hopeless 0 0 0 0  PHQ - 2 Score 0 0 0 0     Fall Risk: Fall Risk  12/12/2015 08/10/2015 07/01/2015 04/06/2015  Falls in the past year? No No No Yes  Number falls in past yr: - - - 1  Injury  with Fall? - - - No     Functional Status Survey: Is the patient deaf or have difficulty hearing?: No Does the patient have difficulty seeing, even when wearing glasses/contacts?: No Does the patient have difficulty concentrating, remembering, or making decisions?: Yes (daughter, helps her pay bills, take her to doctor's visit and helps her with shopping and medications) Does the patient have difficulty walking or climbing stairs?: No Does the patient have difficulty dressing or bathing?: No Does the patient have difficulty doing errands alone such as visiting a doctor's office or shopping?: Yes    Assessment & Plan  1. Uncontrolled type 2 diabetes mellitus with microalbuminuria, without long-term current use of insulin (HCC)  We stopped glipizide on her last visit, glucose a little higher than before, but we will try to adjust diet for now - POCT HgB A1C - POCT Glucose (CBG) - SitaGLIPtin-MetFORMIN HCl (JANUMET XR) 50-1000 MG TB24; Take 2 tablets by mouth daily.  Dispense: 60 tablet; Refill: 3  2. Benign essential HTN  At goal, decrease dose of Benicar to 20 mg daily  - carvedilol (COREG) 25 MG tablet; Take 2 tablets (50 mg total) by mouth daily.  Dispense: 60 tablet; Refill: 8 - Comprehensive metabolic panel  3. Poorly controlled type 2 diabetes mellitus with cataract (HCC)  -  SitaGLIPtin-MetFORMIN HCl (JANUMET XR) 50-1000 MG TB24; Take 2 tablets by mouth daily.  Dispense: 60 tablet; Refill: 3  4. Mild dementia  Continue follow up with Dr. Melrose Nakayama  5. Depression, major, recurrent, mild ([place])  Doing well, patient is pleased with results - FLUoxetine (PROZAC) 20 MG capsule; Take 1 capsule (20 mg total) by mouth daily.  Dispense: 30 capsule; Refill: 8  6. Dyslipidemia  - rosuvastatin (CRESTOR) 20 MG tablet; Take 1 tablet (20 mg total) by mouth daily.  Dispense: 30 tablet; Refill: 8 - Lipid panel  7. Controlled gout  - Uric acid  8. Anemia of chronic disease  - CBC with Differential/Platelet  9. Long-term use of high-risk medication  - Comprehensive metabolic panel  10. Need for hepatitis C screening test  - Hepatitis C antibody

## 2015-12-15 DIAGNOSIS — D638 Anemia in other chronic diseases classified elsewhere: Secondary | ICD-10-CM | POA: Diagnosis not present

## 2015-12-15 DIAGNOSIS — E785 Hyperlipidemia, unspecified: Secondary | ICD-10-CM | POA: Diagnosis not present

## 2015-12-15 DIAGNOSIS — Z79899 Other long term (current) drug therapy: Secondary | ICD-10-CM | POA: Diagnosis not present

## 2015-12-15 DIAGNOSIS — M109 Gout, unspecified: Secondary | ICD-10-CM | POA: Diagnosis not present

## 2015-12-15 DIAGNOSIS — I10 Essential (primary) hypertension: Secondary | ICD-10-CM | POA: Diagnosis not present

## 2015-12-16 ENCOUNTER — Other Ambulatory Visit: Payer: Self-pay | Admitting: Family Medicine

## 2015-12-16 LAB — COMPREHENSIVE METABOLIC PANEL
A/G RATIO: 1.9 (ref 1.1–2.5)
ALT: 14 IU/L (ref 0–32)
AST: 22 IU/L (ref 0–40)
Albumin: 4.1 g/dL (ref 3.6–4.8)
Alkaline Phosphatase: 58 IU/L (ref 39–117)
BILIRUBIN TOTAL: 0.4 mg/dL (ref 0.0–1.2)
BUN/Creatinine Ratio: 16 (ref 11–26)
BUN: 15 mg/dL (ref 8–27)
CHLORIDE: 103 mmol/L (ref 96–106)
CO2: 26 mmol/L (ref 18–29)
Calcium: 9.7 mg/dL (ref 8.7–10.3)
Creatinine, Ser: 0.95 mg/dL (ref 0.57–1.00)
GFR calc non Af Amer: 63 mL/min/{1.73_m2} (ref >59)
GFR, EST AFRICAN AMERICAN: 72 mL/min/{1.73_m2} (ref >59)
GLOBULIN, TOTAL: 2.2 g/dL (ref 1.5–4.5)
Glucose: 129 mg/dL — ABNORMAL HIGH (ref 65–99)
POTASSIUM: 4.3 mmol/L (ref 3.5–5.2)
SODIUM: 144 mmol/L (ref 134–144)
TOTAL PROTEIN: 6.3 g/dL (ref 6.0–8.5)

## 2015-12-16 LAB — LIPID PANEL
CHOLESTEROL TOTAL: 135 mg/dL (ref 100–199)
Chol/HDL Ratio: 2.9 ratio units (ref 0.0–4.4)
HDL: 47 mg/dL (ref >39)
LDL Calculated: 54 mg/dL (ref 0–99)
TRIGLYCERIDES: 169 mg/dL — AB (ref 0–149)
VLDL CHOLESTEROL CAL: 34 mg/dL (ref 5–40)

## 2015-12-16 LAB — CBC WITH DIFFERENTIAL/PLATELET
BASOS: 0 %
Basophils Absolute: 0 10*3/uL (ref 0.0–0.2)
EOS (ABSOLUTE): 0.2 10*3/uL (ref 0.0–0.4)
Eos: 2 %
HEMATOCRIT: 33.7 % — AB (ref 34.0–46.6)
Hemoglobin: 10.9 g/dL — ABNORMAL LOW (ref 11.1–15.9)
IMMATURE GRANULOCYTES: 0 %
Immature Grans (Abs): 0 10*3/uL (ref 0.0–0.1)
LYMPHS ABS: 2.1 10*3/uL (ref 0.7–3.1)
Lymphs: 33 %
MCH: 27.2 pg (ref 26.6–33.0)
MCHC: 32.3 g/dL (ref 31.5–35.7)
MCV: 84 fL (ref 79–97)
MONOS ABS: 0.3 10*3/uL (ref 0.1–0.9)
Monocytes: 5 %
NEUTROS PCT: 60 %
Neutrophils Absolute: 3.8 10*3/uL (ref 1.4–7.0)
PLATELETS: 198 10*3/uL (ref 150–379)
RBC: 4.01 x10E6/uL (ref 3.77–5.28)
RDW: 14.8 % (ref 12.3–15.4)
WBC: 6.4 10*3/uL (ref 3.4–10.8)

## 2015-12-16 LAB — HEPATITIS C ANTIBODY

## 2015-12-16 LAB — URIC ACID: Uric Acid: 8.1 mg/dL — ABNORMAL HIGH (ref 2.5–7.1)

## 2015-12-16 MED ORDER — ALLOPURINOL 100 MG PO TABS: 100.0000 mg | ORAL_TABLET | Freq: Two times a day (BID) | ORAL | Status: DC

## 2015-12-27 ENCOUNTER — Ambulatory Visit: Payer: Medicare Other | Admitting: Obstetrics and Gynecology

## 2016-01-09 DIAGNOSIS — H9193 Unspecified hearing loss, bilateral: Secondary | ICD-10-CM | POA: Diagnosis not present

## 2016-01-09 DIAGNOSIS — R440 Auditory hallucinations: Secondary | ICD-10-CM | POA: Diagnosis not present

## 2016-01-09 DIAGNOSIS — R413 Other amnesia: Secondary | ICD-10-CM | POA: Diagnosis not present

## 2016-01-09 DIAGNOSIS — F0280 Dementia in other diseases classified elsewhere without behavioral disturbance: Secondary | ICD-10-CM | POA: Diagnosis not present

## 2016-01-09 DIAGNOSIS — I4581 Long QT syndrome: Secondary | ICD-10-CM | POA: Diagnosis not present

## 2016-01-09 DIAGNOSIS — F03918 Unspecified dementia, unspecified severity, with other behavioral disturbance: Secondary | ICD-10-CM | POA: Insufficient documentation

## 2016-01-12 DIAGNOSIS — H903 Sensorineural hearing loss, bilateral: Secondary | ICD-10-CM | POA: Diagnosis not present

## 2016-02-08 ENCOUNTER — Other Ambulatory Visit: Payer: Self-pay | Admitting: Family Medicine

## 2016-02-08 MED ORDER — VALSARTAN 160 MG PO TABS: 160.0000 mg | ORAL_TABLET | Freq: Every day | ORAL | Status: DC

## 2016-04-05 ENCOUNTER — Telehealth: Payer: Self-pay | Admitting: Family Medicine

## 2016-04-05 NOTE — Telephone Encounter (Signed)
Has appointment in the morning 04-06-16, but wanted to give you a heads up. She will be needing a note to excuse her from jury duty for July due to medical reasons. Would like to pick it up tomorrow at her appointment.

## 2016-04-06 ENCOUNTER — Encounter: Payer: Self-pay | Admitting: Family Medicine

## 2016-04-06 ENCOUNTER — Ambulatory Visit (INDEPENDENT_AMBULATORY_CARE_PROVIDER_SITE_OTHER): Payer: Medicare Other | Admitting: Family Medicine

## 2016-04-06 VITALS — BP 132/72 | HR 83 | Temp 98.3°F | Resp 16 | Ht 59.0 in | Wt 160.8 lb

## 2016-04-06 DIAGNOSIS — E785 Hyperlipidemia, unspecified: Secondary | ICD-10-CM | POA: Diagnosis not present

## 2016-04-06 DIAGNOSIS — I10 Essential (primary) hypertension: Secondary | ICD-10-CM

## 2016-04-06 DIAGNOSIS — E1136 Type 2 diabetes mellitus with diabetic cataract: Secondary | ICD-10-CM | POA: Diagnosis not present

## 2016-04-06 DIAGNOSIS — R4189 Other symptoms and signs involving cognitive functions and awareness: Secondary | ICD-10-CM

## 2016-04-06 DIAGNOSIS — J40 Bronchitis, not specified as acute or chronic: Secondary | ICD-10-CM | POA: Diagnosis not present

## 2016-04-06 DIAGNOSIS — R809 Proteinuria, unspecified: Secondary | ICD-10-CM

## 2016-04-06 DIAGNOSIS — E1129 Type 2 diabetes mellitus with other diabetic kidney complication: Secondary | ICD-10-CM | POA: Diagnosis not present

## 2016-04-06 DIAGNOSIS — Z23 Encounter for immunization: Secondary | ICD-10-CM

## 2016-04-06 DIAGNOSIS — E1165 Type 2 diabetes mellitus with hyperglycemia: Secondary | ICD-10-CM

## 2016-04-06 DIAGNOSIS — F330 Major depressive disorder, recurrent, mild: Secondary | ICD-10-CM

## 2016-04-06 DIAGNOSIS — F0280 Dementia in other diseases classified elsewhere without behavioral disturbance: Secondary | ICD-10-CM | POA: Diagnosis not present

## 2016-04-06 DIAGNOSIS — IMO0002 Reserved for concepts with insufficient information to code with codable children: Secondary | ICD-10-CM

## 2016-04-06 LAB — POCT GLYCOSYLATED HEMOGLOBIN (HGB A1C): Hemoglobin A1C: 7.3

## 2016-04-06 LAB — POCT UA - MICROALBUMIN: Microalbumin Ur, POC: NEGATIVE mg/L

## 2016-04-06 MED ORDER — BENZONATATE 100 MG PO CAPS: 100.0000 mg | ORAL_CAPSULE | Freq: Three times a day (TID) | ORAL | Status: DC | PRN

## 2016-04-06 MED ORDER — FLUTICASONE FUROATE-VILANTEROL 100-25 MCG/INH IN AEPB: 1.0000 | INHALATION_SPRAY | Freq: Every day | RESPIRATORY_TRACT | Status: DC

## 2016-04-06 MED ORDER — QUETIAPINE FUMARATE 50 MG PO TABS: 50.0000 mg | ORAL_TABLET | Freq: Every day | ORAL | Status: DC

## 2016-04-06 MED ORDER — EMPAGLIFLOZIN 25 MG PO TABS: 25.0000 mg | ORAL_TABLET | Freq: Every day | ORAL | Status: DC

## 2016-04-06 MED ORDER — AZITHROMYCIN 250 MG PO TABS: ORAL_TABLET | ORAL | Status: DC

## 2016-04-06 NOTE — Progress Notes (Signed)
Name: Lindsey [name]   MRN: [id number]    DOB: 08/12/1951   Date:04/06/2016       Progress Note  Subjective  Chief Complaint  Chief Complaint  Patient presents with  . Medication Refill    patient is here for her 11-month f/u  . Diabetes    patient stated that she checks her blood sugar everyday and has not had any problems with it. she says the highest it has been is 140 and cannot remember the lowest.  . Hypertension  . Depression  . Dementia  . Dyslipidemia  . Immunizations    PCV 13  . Orders    mammogram  . Cough    patient stated that she has had a deep productive cough for the past 2 weeks.     HPI   DM II: patient is taking medication as prescribed, and hgbbA1C is up ( above goal at 7.3%). She is now off Glipizide and denies side effects of Janumet, discussed options such as being more diligent about her diet or adding another agent and she prefers adding a new medication.She denies polyphagia, polyuria but has some polydipsia.   Auditory Hallucination: seen by Dr. Melrose Nakayama, and labs normal. On low dose of Seroquel, , but still has it at most a couple of times weekly ( used to be daily before) , stable at this time.   HTN: bp is at goal today,  she denies dizziness, chest pain, or increase in  SOB - only when walking long distance.  Hyperlipidemia: taking Crestor and denies side effects of medication, no myalgia.   Major Depression Mild: taking medication, gets anxious and irritable at times, sleeping well now. No crying spells.   Mild Dementia: seen by Dr. Melrose Nakayama Less hallucinations, taking medications as prescribed. She still lives alone, daughter is managing her bills, doctors visits, and she also dispenses her medications. Still gets forgetful.   Bronchitis: she developed cold symptoms, rhinorrhea and nasal congestion last week, followed by a wet cough, and chills at night. No rashes, no SOB but has occasional wheezing.    Patient Active Problem List   Diagnosis Date  Noted  . 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 Uncontrolled type 2 diabetes mellitus with microalbuminuria, without long-term current use of insulin ([place]) 12/12/2015  . Mild dementia 11/01/2015  . Auditory hallucination 07/26/2015  . Anemia of chronic disease 07/05/2015  . Poorly controlled type 2 diabetes mellitus with cataract ([place]) 04/06/2015  . Cataract 04/06/2015  . Benign essential HTN 04/02/2015  . Controlled gout 04/02/2015  . Dyslipidemia 04/02/2015  . Depression, major, recurrent, mild ([place]) 04/02/2015  . Fibromyalgia syndrome 04/02/2015  . Chronic insomnia 04/02/2015  . Cognitive decline 04/02/2015  . Nodular basal cell carcinoma 04/02/2015  . Morbid obesity ([place]) 04/02/2015  . Osteopenia 04/02/2015  . Calcium blood increased 04/02/2015    Past Surgical History  Procedure Laterality Date  . Cesarean section    . Tonsilectomy, adenoidectomy, bilateral myringotomy and tubes      Family History  Problem Relation Age of Onset  . Hypertension Mother   . Osteoporosis Mother   . Cancer Mother     brain tumor  . Hypertension Sister   . Diabetes Sister   . Hypertension Sister   . Cancer Sister   . Cancer Sister 34    breast    Social History   Social History  . Marital Status: Divorced    Spouse Name: N/A  . Number  of Children: N/A  . Years of Education: N/A   Occupational History  . Not on file.   Social History Main Topics  . Smoking status: Never Smoker   . Smokeless tobacco: Never Used  . Alcohol Use: No  . Drug Use: No  . Sexual Activity: Not Currently   Other Topics Concern  . Not on file   Social History Narrative     Current outpatient prescriptions:  .  allopurinol (ZYLOPRIM) 100 MG tablet, Take 1 tablet (100 mg total) by mouth 2 (two) times daily., Disp: 60 tablet, Rfl: 5 .  aspirin 81 MG chewable tablet, Chew 1 tablet by mouth daily., Disp: , Rfl:  .  azithromycin (ZITHROMAX Z-PAK) 250 MG tablet, Take as  directed, Disp: 6 each, Rfl: 0 .  benzonatate (TESSALON) 100 MG capsule, Take 1-2 capsules (100-200 mg total) by mouth 3 (three) times daily as needed., Disp: 40 capsule, Rfl: 0 .  BLACK COHOSH PO, Take by mouth., Disp: , Rfl:  .  calcium-vitamin D (OSCAL 500/200 D-3) 500-200 MG-UNIT per tablet, Take 1 tablet by mouth daily., Disp: , Rfl:  .  carvedilol (COREG) 25 MG tablet, Take 2 tablets (50 mg total) by mouth daily., Disp: 60 tablet, Rfl: 8 .  donepezil (ARICEPT) 5 MG tablet, Take 5 mg by mouth at bedtime., Disp: , Rfl:  .  empagliflozin (JARDIANCE) 25 MG TABS tablet, Take 25 mg by mouth daily., Disp: 30 tablet, Rfl: 4 .  FLUoxetine (PROZAC) 20 MG capsule, Take 1 capsule (20 mg total) by mouth daily., Disp: 30 capsule, Rfl: 8 .  fluticasone furoate-vilanterol (BREO ELLIPTA) 100-25 MCG/INH AEPB, Inhale 1 puff into the lungs daily., Disp: 60 each, Rfl: 0 .  GLUCOSE BLOOD VI, , Disp: , Rfl:  .  MULTIPLE VITAMIN PO, Take 1 tablet by mouth daily., Disp: , Rfl:  .  QUEtiapine (SEROQUEL) 50 MG tablet, Take 1 tablet (50 mg total) by mouth at bedtime., Disp: 90 tablet, Rfl: 1 .  raloxifene (EVISTA) 60 MG tablet, Take 1 tablet (60 mg total) by mouth daily., Disp: 30 tablet, Rfl: 12 .  rosuvastatin (CRESTOR) 20 MG tablet, Take 1 tablet (20 mg total) by mouth daily., Disp: 30 tablet, Rfl: 8 .  SitaGLIPtin-MetFORMIN HCl (JANUMET XR) 50-1000 MG TB24, Take 2 tablets by mouth daily., Disp: 60 tablet, Rfl: 3 .  valsartan (DIOVAN) 160 MG tablet, Take 1 tablet (160 mg total) by mouth daily., Disp: 30 tablet, Rfl: 8  Allergies  Allergen Reactions  . Ace Inhibitors      ROS  Constitutional: Negative for fever or weight change.  Respiratory: Positive  for cough no  shortness of breath.   Cardiovascular: Negative for chest pain or palpitations.  Gastrointestinal: Negative for abdominal pain, no bowel changes.  Musculoskeletal: Negative for gait problem or joint swelling.  Skin: Negative for rash.   Neurological: Negative for dizziness or headache.  No other specific complaints in a complete review of systems (except as listed in HPI above).  Objective  Filed Vitals:   04/06/16 0820  BP: 132/72  Pulse: 83  Temp: 98.3 F (36.8 C)  TempSrc: Oral  Resp: 16  Height: 4\' 11"  (1.499 m)  Weight: 160 lb 12.8 oz (72.938 kg)  SpO2: 97%    Body mass index is 32.46 kg/(m^2).  Physical Exam  Constitutional: Patient appears well-developed and well-nourished. Obese  No distress.  HEENT: head atraumatic, normocephalic, pupils equal and reactive to light, boggy turbinates, neck supple, throat within normal limits  Cardiovascular: Normal rate, regular rhythm and normal heart sounds.  No murmur heard. No BLE edema. Pulmonary/Chest: Effort normal , rhonchi on anterior chest. No respiratory distress. Abdominal: Soft.  There is no tenderness. Psychiatric: Patient has a normal mood and affect. behavior is normal.   Recent Results (from the past 2160 hour(s))  POCT glycosylated hemoglobin (Hb A1C)     Status: Abnormal   Collection Time: 04/06/16  8:23 AM  Result Value Ref Range   Hemoglobin A1C 7.3   POCT UA - Microalbumin     Status: Normal   Collection Time: 04/06/16  8:24 AM  Result Value Ref Range   Microalbumin Ur, POC NEGATIVE mg/L   Creatinine, POC  mg/dL   Albumin/Creatinine Ratio, Urine, POC      Diabetic Foot Exam: Diabetic Foot Exam - Simple   Simple Foot Form  Diabetic Foot exam was performed with the following findings:  Yes 04/06/2016  8:37 AM  Visual Inspection  No deformities, no ulcerations, no other skin breakdown bilaterally:  Yes  Sensation Testing  Intact to touch and monofilament testing bilaterally:  Yes  Pulse Check  Posterior Tibialis and Dorsalis pulse intact bilaterally:  Yes  Comments       PHQ2/9: Depression screen [facility] 2/9 04/06/2016 12/12/2015 08/10/2015 07/01/2015 04/06/2015  Decreased Interest 0 0 0 0 0  Down, Depressed, Hopeless 0 0 0 0 0  PHQ - 2  Score 0 0 0 0 0    Fall Risk: Fall Risk  04/06/2016 12/12/2015 08/10/2015 07/01/2015 04/06/2015  Falls in the past year? No No No No Yes  Number falls in past yr: - - - - 1  Injury with Fall? - - - - No     Functional Status Survey: Is the patient deaf or have difficulty hearing?: No Does the patient have difficulty seeing, even when wearing glasses/contacts?: No Does the patient have difficulty concentrating, remembering, or making decisions?: No Does the patient have difficulty walking or climbing stairs?: No Does the patient have difficulty dressing or bathing?: No Does the patient have difficulty doing errands alone such as visiting a doctor's office or shopping?: No    Assessment & Plan  1. Uncontrolled type 2 diabetes mellitus with microalbuminuria, without long-term current use of insulin (HCC)  - POCT glycosylated hemoglobin (Hb A1C) - POCT UA - Microalbumin - empagliflozin (JARDIANCE) 25 MG TABS tablet; Take 25 mg by mouth daily.  Dispense: 30 tablet; Refill: 4  2. Benign essential HTN  Continue medication   3. Depression, major, recurrent, mild ([place])  Doing well continue Prozac and Seroquel - QUEtiapine (SEROQUEL) 50 MG tablet; Take 1 tablet (50 mg total) by mouth at bedtime.  Dispense: 90 tablet; Refill: 1  4. Dyslipidemia  Continue Crestor  5. Cognitive decline  Stable on Aricept  6. Type 2 diabetes mellitus with diabetic cataract, without long-term current use of insulin (HCC)  We will add Jardiance, discussed possible side effects - empagliflozin (JARDIANCE) 25 MG TABS tablet; Take 25 mg by mouth daily.  Dispense: 30 tablet; Refill: 4  7. 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  Dispense: 90 tablet; Refill: 1  8. Need for pneumococcal vaccine  - Pneumococcal conjugate vaccine 13-valent IM  9. Bronchitis  - azithromycin  (ZITHROMAX Z-PAK) 250 MG tablet; Take as directed  Dispense: 6 each; Refill: 0 - benzonatate (TESSALON) 100 MG capsule; Take 1-2  capsules (100-200 mg total) by mouth 3 (three) times daily as needed.  Dispense: 40 capsule; Refill: 0 - fluticasone furoate-vilanterol (BREO ELLIPTA) 100-25 MCG/INH AEPB; Inhale 1 puff into the lungs daily.  Dispense: 60 each; Refill: 0

## 2016-04-12 DIAGNOSIS — H43813 Vitreous degeneration, bilateral: Secondary | ICD-10-CM | POA: Diagnosis not present

## 2016-04-20 ENCOUNTER — Other Ambulatory Visit: Payer: Self-pay | Admitting: Family Medicine

## 2016-04-20 ENCOUNTER — Ambulatory Visit: Payer: Medicare Other | Attending: Family Medicine

## 2016-04-20 ENCOUNTER — Telehealth: Payer: Self-pay

## 2016-04-20 MED ORDER — IRBESARTAN 150 MG PO TABS: 150.0000 mg | ORAL_TABLET | Freq: Every day | ORAL | Status: DC

## 2016-04-20 NOTE — Telephone Encounter (Signed)
Per the request of Dr. Steele Sizer, I tried to contact this patient's daughter to inform her that her mother's prescription was changed from the [place] 20mg  to Avapro 150mg , but there was no answer. A message was left for her explaining this and for her to give [place] a call if she had any questions.

## 2016-04-23 ENCOUNTER — Other Ambulatory Visit: Payer: Self-pay

## 2016-04-30 DIAGNOSIS — F0390 Unspecified dementia without behavioral disturbance: Secondary | ICD-10-CM | POA: Diagnosis not present

## 2016-04-30 DIAGNOSIS — R440 Auditory hallucinations: Secondary | ICD-10-CM | POA: Diagnosis not present

## 2016-04-30 DIAGNOSIS — F0280 Dementia in other diseases classified elsewhere without behavioral disturbance: Secondary | ICD-10-CM | POA: Diagnosis not present

## 2016-05-17 ENCOUNTER — Telehealth: Payer: Self-pay | Admitting: Family Medicine

## 2016-05-18 NOTE — Telephone Encounter (Signed)
I don't know what code to use. Can you call [place]?

## 2016-05-18 NOTE — Telephone Encounter (Signed)
Daughter states due to the codes used for the Mammogram, Medicare will not cover the Mammogram. Can you please change diagnosis codes? Thanks

## 2016-05-24 NOTE — Telephone Encounter (Signed)
Do you know what diagnosis codes we can use to have her Insurance pay for her Mammogram?

## 2016-06-21 ENCOUNTER — Telehealth: Payer: Self-pay

## 2016-06-21 NOTE — Telephone Encounter (Signed)
Patient daughter called states her mother has been more anxious lately in the past 2 weeks. Patient gets very frazzled easily and didn't know if she needed to up her depression medication or give her something prn. [name] asked if there was anything you could do to help her mother's symptoms. Informed her daughter I would pass the message to Dr. Ancil Boozer and called her once she responded to me.

## 2016-06-24 NOTE — Telephone Encounter (Signed)
Can she come in?

## 2016-06-26 NOTE — Telephone Encounter (Signed)
Patient will discuss medication during her October visit.

## 2016-06-26 NOTE — Telephone Encounter (Signed)
Where would you like for me to put her? °

## 2016-06-26 NOTE — Telephone Encounter (Signed)
Please schedule appointment.

## 2016-07-03 ENCOUNTER — Other Ambulatory Visit: Payer: Self-pay | Admitting: Family Medicine

## 2016-07-03 DIAGNOSIS — R440 Auditory hallucinations: Secondary | ICD-10-CM

## 2016-07-03 NOTE — Telephone Encounter (Signed)
Patient requesting refill of Seroquel to [facility].

## 2016-08-06 ENCOUNTER — Ambulatory Visit (INDEPENDENT_AMBULATORY_CARE_PROVIDER_SITE_OTHER): Payer: Medicare Other | Admitting: Family Medicine

## 2016-08-06 ENCOUNTER — Encounter: Payer: Self-pay | Admitting: Family Medicine

## 2016-08-06 VITALS — BP 136/78 | HR 86 | Temp 97.9°F | Resp 16 | Ht 59.0 in | Wt 159.9 lb

## 2016-08-06 DIAGNOSIS — Z1231 Encounter for screening mammogram for malignant neoplasm of breast: Secondary | ICD-10-CM

## 2016-08-06 DIAGNOSIS — R809 Proteinuria, unspecified: Secondary | ICD-10-CM

## 2016-08-06 DIAGNOSIS — Z23 Encounter for immunization: Secondary | ICD-10-CM

## 2016-08-06 DIAGNOSIS — R440 Auditory hallucinations: Secondary | ICD-10-CM | POA: Diagnosis not present

## 2016-08-06 DIAGNOSIS — M109 Gout, unspecified: Secondary | ICD-10-CM | POA: Diagnosis not present

## 2016-08-06 DIAGNOSIS — I10 Essential (primary) hypertension: Secondary | ICD-10-CM

## 2016-08-06 DIAGNOSIS — M8580 Other specified disorders of bone density and structure, unspecified site: Secondary | ICD-10-CM | POA: Diagnosis not present

## 2016-08-06 DIAGNOSIS — E1129 Type 2 diabetes mellitus with other diabetic kidney complication: Secondary | ICD-10-CM

## 2016-08-06 DIAGNOSIS — E785 Hyperlipidemia, unspecified: Secondary | ICD-10-CM | POA: Diagnosis not present

## 2016-08-06 DIAGNOSIS — Z1239 Encounter for other screening for malignant neoplasm of breast: Secondary | ICD-10-CM

## 2016-08-06 DIAGNOSIS — F0391 Unspecified dementia with behavioral disturbance: Secondary | ICD-10-CM | POA: Diagnosis not present

## 2016-08-06 DIAGNOSIS — E1136 Type 2 diabetes mellitus with diabetic cataract: Secondary | ICD-10-CM | POA: Diagnosis not present

## 2016-08-06 DIAGNOSIS — IMO0002 Reserved for concepts with insufficient information to code with codable children: Secondary | ICD-10-CM

## 2016-08-06 DIAGNOSIS — E1165 Type 2 diabetes mellitus with hyperglycemia: Secondary | ICD-10-CM

## 2016-08-06 DIAGNOSIS — F330 Major depressive disorder, recurrent, mild: Secondary | ICD-10-CM

## 2016-08-06 LAB — POCT GLYCOSYLATED HEMOGLOBIN (HGB A1C): HEMOGLOBIN A1C: 7.1

## 2016-08-06 MED ORDER — ALLOPURINOL 100 MG PO TABS
100.0000 mg | ORAL_TABLET | Freq: Two times a day (BID) | ORAL | 8 refills | Status: DC
Start: 2016-08-06 — End: 2017-04-01

## 2016-08-06 MED ORDER — RALOXIFENE HCL 60 MG PO TABS: 60.0000 mg | ORAL_TABLET | Freq: Every day | ORAL | 12 refills | Status: DC

## 2016-08-06 MED ORDER — EMPAGLIFLOZIN 25 MG PO TABS: 25.0000 mg | ORAL_TABLET | Freq: Every day | ORAL | 4 refills | Status: DC

## 2016-08-06 MED ORDER — QUETIAPINE FUMARATE 25 MG PO TABS
ORAL_TABLET | ORAL | 8 refills | Status: DC
Start: 2016-08-06 — End: 2017-04-01

## 2016-08-06 MED ORDER — ROSUVASTATIN CALCIUM 20 MG PO TABS: 20.0000 mg | ORAL_TABLET | Freq: Every day | ORAL | 8 refills | Status: DC

## 2016-08-06 MED ORDER — CARVEDILOL 25 MG PO TABS: 50.0000 mg | ORAL_TABLET | Freq: Every day | ORAL | 8 refills | Status: DC

## 2016-08-06 MED ORDER — SITAGLIP PHOS-METFORMIN HCL ER 50-1000 MG PO TB24: 2.0000 | ORAL_TABLET | Freq: Every day | ORAL | 8 refills | Status: DC

## 2016-08-06 NOTE — Progress Notes (Signed)
Name: Lindsey [name]   MRN: [id number]    DOB: 1951-03-13   Date:08/06/2016       Progress Note  Subjective  Chief Complaint  Chief Complaint  Patient presents with  . Medication Refill    4 month F/U  . Diabetes    Checks every other day but does not keep a log of her sugars. Does not eat alot but drinks pepsi's.   . Hyperlipidemia  . Depression    Improving per patient, but daughter states she is very anxious and anxiety filled. Will constantly lose things.   . Hypertension    Denies any symptoms  . Dementia    Patient states she tries to stay busy cooking and cleaning.  Daughter states she is very anxious not so much depressed and wanted to know if we could change the Prozac.     HPI  DM II: patient is taking medication as prescribed, and hgbbA1C is up ( above goal at 7.1% but is decreased from 7.3%). She is now off Glipizide and denies side effects of Janumet or Jardiance. She denies polyphagia, polyuria but has some polydipsia.   Dementia/behavior changes/Auditory Hallucination: seen by Dr. Melrose Nakayama, and labs normal. On low dose of Seroquel, recently hiding items at home ( food, her purse ) also called 911 because she thought someone was inside her house. She depends on her daughter to pay her bills, and has to be reminded to take medication and eat. She gets agitated at times, worse when she has auditory hallucinations.  Discussed sitter versus nursing home placement with daughter and patient.   HTN: bp is at goal today,  she denies dizziness, chest pain, or increase in  SOB - except  when walking long distance.  Hyperlipidemia: taking Crestor and denies side effects of medication, no myalgia.   Major Depression Mild: taking medication, gets anxious and irritable at times but likely from dementia, sleeping well now. No crying spells.     Patient Active Problem List   Diagnosis Date Noted  . 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 Uncontrolled type  2 diabetes mellitus with microalbuminuria, without long-term current use of insulin ([place]) 12/12/2015  . Mild dementia 11/01/2015  . Auditory hallucination 07/26/2015  . Anemia of chronic disease 07/05/2015  . Poorly controlled type 2 diabetes mellitus with cataract ([place]) 04/06/2015  . Cataract 04/06/2015  . Benign essential HTN 04/02/2015  . Controlled gout 04/02/2015  . Dyslipidemia 04/02/2015  . Depression, major, recurrent, mild ([place]) 04/02/2015  . Fibromyalgia syndrome 04/02/2015  . Chronic insomnia 04/02/2015  . Cognitive decline 04/02/2015  . Nodular basal cell carcinoma 04/02/2015  . Morbid obesity ([place]) 04/02/2015  . Osteopenia 04/02/2015  . Calcium blood increased 04/02/2015    Past Surgical History:  Procedure Laterality Date  . CESAREAN SECTION    . TONSILECTOMY, ADENOIDECTOMY, BILATERAL MYRINGOTOMY AND TUBES      Family History  Problem Relation Age of Onset  . Hypertension Mother   . Osteoporosis Mother   . Cancer Mother     brain tumor  . Hypertension Sister   . Diabetes Sister   . Hypertension Sister   . Cancer Sister   . Cancer Sister 72    breast    Social History   Social History  . Marital status: Divorced    Spouse name: N/A  . Number of children: N/A  . Years of education: N/A   Occupational History  . Not on file.   Social  History Main Topics  . Smoking status: Never Smoker  . Smokeless tobacco: Never Used  . Alcohol use No  . Drug use: No  . Sexual activity: Not Currently   Other Topics Concern  . Not on file   Social History Narrative  . No narrative on file     Current Outpatient Prescriptions:  .  allopurinol (ZYLOPRIM) 100 MG tablet, Take 1 tablet (100 mg total) by mouth 2 (two) times daily., Disp: 60 tablet, Rfl: 8 .  aspirin 81 MG chewable tablet, Chew 1 tablet by mouth daily., Disp: , Rfl:  .  BLACK COHOSH PO, Take by mouth., Disp: , Rfl:  .  calcium-vitamin D (OSCAL 500/200 D-3) 500-200 MG-UNIT per tablet, Take 1  tablet by mouth daily., Disp: , Rfl:  .  carvedilol (COREG) 25 MG tablet, Take 2 tablets (50 mg total) by mouth daily., Disp: 60 tablet, Rfl: 8 .  donepezil (ARICEPT) 5 MG tablet, Take 5 mg by mouth at bedtime., Disp: , Rfl:  .  empagliflozin (JARDIANCE) 25 MG TABS tablet, Take 25 mg by mouth daily., Disp: 30 tablet, Rfl: 4 .  FLUoxetine (PROZAC) 20 MG capsule, Take 1 capsule (20 mg total) by mouth daily., Disp: 30 capsule, Rfl: 8 .  GLUCOSE BLOOD VI, , Disp: , Rfl:  .  MULTIPLE VITAMIN PO, Take 1 tablet by mouth daily., Disp: , Rfl:  .  QUEtiapine (SEROQUEL) 25 MG tablet, TAKE ONE-HALF TO ONE TABLET BY MOUTH AT BEDTIME, Disp: 30 tablet, Rfl: 8 .  raloxifene (EVISTA) 60 MG tablet, Take 1 tablet (60 mg total) by mouth daily., Disp: 30 tablet, Rfl: 12 .  rosuvastatin (CRESTOR) 20 MG tablet, Take 1 tablet (20 mg total) by mouth daily., Disp: 30 tablet, Rfl: 8 .  SitaGLIPtin-MetFORMIN HCl (JANUMET XR) 50-1000 MG TB24, Take 2 tablets by mouth daily., Disp: 60 tablet, Rfl: 8 .  valsartan (DIOVAN) 40 MG tablet, Take by mouth., Disp: , Rfl:   Allergies  Allergen Reactions  . Ace Inhibitors      ROS  Constitutional: Negative for fever or weight change.  Respiratory: Negative for cough and shortness of breath.   Cardiovascular: Negative for chest pain or palpitations.  Gastrointestinal: Negative for abdominal pain, no bowel changes.  Musculoskeletal: Negative for gait problem or joint swelling.  Skin: Negative for rash.  Neurological: Negative for dizziness or headache.  No other specific complaints in a complete review of systems (except as listed in HPI above).  Objective  Vitals:   08/06/16 1438  BP: 136/78  Pulse: 86  Resp: 16  Temp: 97.9 F (36.6 C)  TempSrc: Oral  SpO2: 97%  Weight: 159 lb 14.4 oz (72.5 kg)  Height: 4\' 11"  (1.499 m)    Body mass index is 32.3 kg/m.  Physical Exam  Constitutional: Patient appears well-developed and well-nourished. Obese  No distress.   HEENT: head atraumatic, normocephalic, pupils equal and reactive to light, neck supple, throat within normal limits Cardiovascular: Normal rate, regular rhythm and normal heart sounds.  No murmur heard. No BLE edema. Pulmonary/Chest: Effort normal and breath sounds normal. No respiratory distress. Abdominal: Soft.  There is no tenderness. Psychiatric: Patient has a normal mood and affect. behavior is normal. Judgment and thought content normal. She follows simples commands, she is very forgetful.   Recent Results (from the past 2160 hour(s))  POCT HgB A1C     Status: Abnormal   Collection Time: 08/06/16  2:47 PM  Result Value Ref Range   Hemoglobin  A1C 7.1      PHQ2/9: Depression screen [facility] 2/9 08/06/2016 04/06/2016 12/12/2015 08/10/2015 07/01/2015  Decreased Interest 0 0 0 0 0  Down, Depressed, Hopeless 0 0 0 0 0  PHQ - 2 Score 0 0 0 0 0     Fall Risk: Fall Risk  08/06/2016 04/06/2016 12/12/2015 08/10/2015 07/01/2015  Falls in the past year? No No No No No  Number falls in past yr: - - - - -  Injury with Fall? - - - - -     Functional Status Survey: Is the patient deaf or have difficulty hearing?: No Does the patient have difficulty seeing, even when wearing glasses/contacts?: No Does the patient have difficulty concentrating, remembering, or making decisions?: Yes Does the patient have difficulty walking or climbing stairs?: No Does the patient have difficulty dressing or bathing?: No Does the patient have difficulty doing errands alone such as visiting a doctor's office or shopping?: Yes (Patient has never drove)   Assessment & Plan  1. Poorly controlled type 2 diabetes mellitus with cataract ([place])  - POCT HgB A1C - empagliflozin (JARDIANCE) 25 MG TABS tablet; Take 25 mg by mouth daily.  Dispense: 30 tablet; Refill: 4 - SitaGLIPtin-MetFORMIN HCl (JANUMET XR) 50-1000 MG TB24; Take 2 tablets by mouth daily.  Dispense: 60 tablet; Refill: 8  2. Uncontrolled type 2 diabetes  mellitus with microalbuminuria, without long-term current use of insulin (HCC)  - POCT HgB A1C - empagliflozin (JARDIANCE) 25 MG TABS tablet; Take 25 mg by mouth daily.  Dispense: 30 tablet; Refill: 4 - SitaGLIPtin-MetFORMIN HCl (JANUMET XR) 50-1000 MG TB24; Take 2 tablets by mouth daily.  Dispense: 60 tablet; Refill: 8  3. Needs flu shot  - Flu vaccine HIGH DOSE PF (Fluzone High dose)  4. Benign essential HTN  - carvedilol (COREG) 25 MG tablet; Take 2 tablets (50 mg total) by mouth daily.  Dispense: 60 tablet; Refill: 8  5. Depression, major, recurrent, mild (HCC)  Continue medication   6. Dyslipidemia  - rosuvastatin (CRESTOR) 20 MG tablet; Take 1 tablet (20 mg total) by mouth daily.  Dispense: 30 tablet; Refill: 8  7. Dementia with behavioral disturbance, unspecified dementia type  Needs follow up with Dr. Melrose Nakayama  8. Auditory hallucination  - QUEtiapine (SEROQUEL) 25 MG tablet; TAKE ONE-HALF TO ONE TABLET BY MOUTH AT BEDTIME  Dispense: 30 tablet; Refill: 8   9. Type 2 diabetes mellitus with diabetic cataract, without long-term current use of insulin (HCC)  - empagliflozin (JARDIANCE) 25 MG TABS tablet; Take 25 mg by mouth daily.  Dispense: 30 tablet; Refill: 4  10. Controlled gout  - allopurinol (ZYLOPRIM) 100 MG tablet; Take 1 tablet (100 mg total) by mouth 2 (two) times daily.  Dispense: 60 tablet; Refill: 8  11. Osteopenia, unspecified location  - raloxifene (EVISTA) 60 MG tablet; Take 1 tablet (60 mg total) by mouth daily.  Dispense: 30 tablet; Refill: 12

## 2016-08-16 ENCOUNTER — Telehealth: Payer: Self-pay | Admitting: Family Medicine

## 2016-08-16 NOTE — Telephone Encounter (Signed)
FYISharyn Lull (daughter) states patient is signing up for new insurance. The new insurance will not cover jardiance, however it will not go into place until the new year 2018

## 2016-08-17 NOTE — Telephone Encounter (Signed)
ok 

## 2016-09-05 DIAGNOSIS — F0390 Unspecified dementia without behavioral disturbance: Secondary | ICD-10-CM | POA: Diagnosis not present

## 2016-09-05 DIAGNOSIS — F0280 Dementia in other diseases classified elsewhere without behavioral disturbance: Secondary | ICD-10-CM | POA: Diagnosis not present

## 2016-09-05 DIAGNOSIS — F29 Unspecified psychosis not due to a substance or known physiological condition: Secondary | ICD-10-CM | POA: Diagnosis not present

## 2016-09-10 ENCOUNTER — Ambulatory Visit: Payer: Medicare Other

## 2016-10-02 ENCOUNTER — Ambulatory Visit
Admission: RE | Admit: 2016-10-02 | Discharge: 2016-10-02 | Disposition: A | Discharge status: Home / Self Care | Payer: Medicare Other | Source: Ambulatory Visit | Attending: Family Medicine | Admitting: Family Medicine

## 2016-10-02 DIAGNOSIS — Z1231 Encounter for screening mammogram for malignant neoplasm of breast: Secondary | ICD-10-CM | POA: Insufficient documentation

## 2016-10-02 DIAGNOSIS — Z1239 Encounter for other screening for malignant neoplasm of breast: Secondary | ICD-10-CM

## 2016-11-02 ENCOUNTER — Other Ambulatory Visit: Payer: Self-pay | Admitting: Family Medicine

## 2016-11-05 ENCOUNTER — Telehealth: Payer: Self-pay

## 2016-11-05 NOTE — Telephone Encounter (Signed)
Patient daughter states Evista and Vania Rea will no longer cover these medications, she switch to [place]. Please change.

## 2016-11-05 NOTE — Telephone Encounter (Signed)
I explained to her daughter, to call insurance and see if Wilder Glade is covered, if not she will need to return for follow up. We will discuss other options for osteoporosis on her next visit

## 2016-11-06 ENCOUNTER — Telehealth: Payer: Self-pay | Admitting: Family Medicine

## 2016-11-06 NOTE — Telephone Encounter (Signed)
Left voice message to schedule appt

## 2016-11-06 NOTE — Telephone Encounter (Signed)
[name] will give [place] a call back she has to check her work schedule.

## 2016-11-06 NOTE — Telephone Encounter (Signed)
[name] (daughter) states insurance does accept farxiga 10mg  and fosamax 70mg 

## 2016-11-06 NOTE — Telephone Encounter (Signed)
According to notes Dr. Ancil Boozer stated that the pt needs to return to office to discuss changes please make appointment.

## 2016-11-07 NOTE — Telephone Encounter (Signed)
[name] called back and scheduled her mom an appointment for 12-19-16. Stated that was the soonest she was able to get her here. Also states that her mom will be out of the medication before the appointment.

## 2016-11-08 ENCOUNTER — Other Ambulatory Visit: Payer: Self-pay | Admitting: Family Medicine

## 2016-11-08 MED ORDER — ALENDRONATE SODIUM 70 MG PO TABS: 70.0000 mg | ORAL_TABLET | ORAL | 5 refills | Status: DC

## 2016-11-08 MED ORDER — DAPAGLIFLOZIN PROPANEDIOL 10 MG PO TABS: 10.0000 mg | ORAL_TABLET | Freq: Every day | ORAL | 1 refills | Status: DC

## 2016-11-08 NOTE — Telephone Encounter (Signed)
What would you like to change her medication to so that I can call it in? Insurance will not cover farxiga an fosomax

## 2016-11-08 NOTE — Telephone Encounter (Signed)
Sent prescription to her pharmacy.

## 2016-11-09 NOTE — Telephone Encounter (Signed)
The message said insurance does accept [place] and Fosamax, she was on Jardiance and Evista - medications that were not covered

## 2016-11-13 ENCOUNTER — Ambulatory Visit (INDEPENDENT_AMBULATORY_CARE_PROVIDER_SITE_OTHER): Payer: Medicare Other | Admitting: Family Medicine

## 2016-11-13 ENCOUNTER — Encounter: Payer: Self-pay | Admitting: Family Medicine

## 2016-11-13 VITALS — BP 132/70 | HR 96 | Temp 98.3°F | Resp 16 | Ht 59.0 in | Wt 161.5 lb

## 2016-11-13 DIAGNOSIS — E1165 Type 2 diabetes mellitus with hyperglycemia: Secondary | ICD-10-CM

## 2016-11-13 DIAGNOSIS — M8580 Other specified disorders of bone density and structure, unspecified site: Secondary | ICD-10-CM | POA: Diagnosis not present

## 2016-11-13 DIAGNOSIS — E1129 Type 2 diabetes mellitus with other diabetic kidney complication: Secondary | ICD-10-CM

## 2016-11-13 DIAGNOSIS — R809 Proteinuria, unspecified: Secondary | ICD-10-CM

## 2016-11-13 DIAGNOSIS — IMO0002 Reserved for concepts with insufficient information to code with codable children: Secondary | ICD-10-CM

## 2016-11-13 LAB — POCT URINALYSIS DIPSTICK
BILIRUBIN UA: NORMAL
Blood, UA: NORMAL
CLARITY UA: NORMAL
Color, UA: NORMAL
Glucose, UA: NORMAL
KETONES UA: NORMAL
NITRITE UA: NORMAL
Protein, UA: NORMAL
Spec Grav, UA: 1.02
Urobilinogen, UA: 1
pH, UA: 7

## 2016-11-13 LAB — GLUCOSE, POCT (MANUAL RESULT ENTRY): POC Glucose: 99 mg/dl (ref 70–99)

## 2016-11-13 NOTE — Progress Notes (Signed)
Name: Lindsey [name]   MRN: [id number]    DOB: [birth year]   Date:11/13/2016       Progress Note  Subjective  Chief Complaint  Chief Complaint  Patient presents with  . Medication Management    patient is here to go over the new meds that her insurance will cover.    HPI  DMII: insurance is no longer covering [place], but daughter called and we changed to [place], she has been tolerating medication well, no side effects. Her daughter dispenses her medications for her, no polyphagia, polydipsia or polyuria  Osteopenia: she was on Evista, but switched to Fosamax because of insurance changes. She is doing well, but reviewed how to take fosamax with her during the visit.    Patient Active Problem List   Diagnosis Date Noted  . 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 Uncontrolled type 2 diabetes mellitus with microalbuminuria, without long-term current use of insulin ([place]) 12/12/2015  . Mild dementia 11/01/2015  . Auditory hallucination 07/26/2015  . Anemia of chronic disease 07/05/2015  . Poorly controlled type 2 diabetes mellitus with cataract ([place]) 04/06/2015  . Cataract 04/06/2015  . Benign essential HTN 04/02/2015  . Controlled gout 04/02/2015  . Dyslipidemia 04/02/2015  . Depression, major, recurrent, mild ([place]) 04/02/2015  . Fibromyalgia syndrome 04/02/2015  . Chronic insomnia 04/02/2015  . Cognitive decline 04/02/2015  . Nodular basal cell carcinoma 04/02/2015  . Morbid obesity ([place]) 04/02/2015  . Osteopenia 04/02/2015  . Calcium blood increased 04/02/2015    Past Surgical History:  Procedure Laterality Date  . CESAREAN SECTION    . TONSILECTOMY, ADENOIDECTOMY, BILATERAL MYRINGOTOMY AND TUBES      Family History  Problem Relation Age of Onset  . Hypertension Mother   . Osteoporosis Mother   . Cancer Mother     brain tumor  . Hypertension Sister   . Cancer Sister   . Hypertension Sister   . Diabetes Sister   . Cancer Sister 70   breast    Social History   Social History  . Marital status: Divorced    Spouse name: N/A  . Number of children: N/A  . Years of education: N/A   Occupational History  . Not on file.   Social History Main Topics  . Smoking status: Never Smoker  . Smokeless tobacco: Never Used  . Alcohol use No  . Drug use: No  . Sexual activity: Not Currently   Other Topics Concern  . Not on file   Social History Narrative  . No narrative on file     Current Outpatient Prescriptions:  .  alendronate (FOSAMAX) 70 MG tablet, Take 1 tablet (70 mg total) by mouth every 7 (seven) days. Take with a full glass of water on an empty stomach., Disp: 4 tablet, Rfl: 5 .  allopurinol (ZYLOPRIM) 100 MG tablet, Take 1 tablet (100 mg total) by mouth 2 (two) times daily., Disp: 60 tablet, Rfl: 8 .  aspirin 81 MG chewable tablet, Chew 1 tablet by mouth daily., Disp: , Rfl:  .  BLACK COHOSH PO, Take by mouth., Disp: , Rfl:  .  calcium-vitamin D (OSCAL 500/200 D-3) 500-200 MG-UNIT per tablet, Take 1 tablet by mouth daily., Disp: , Rfl:  .  carvedilol (COREG) 25 MG tablet, Take 2 tablets (50 mg total) by mouth daily., Disp: 60 tablet, Rfl: 8 .  dapagliflozin propanediol (FARXIGA) 10 MG TABS tablet, Take 10 mg by mouth daily., Disp: 30 tablet, Rfl: 1 .  donepezil (ARICEPT) 5 MG tablet, Take 5 mg by mouth at bedtime., Disp: , Rfl:  .  FLUoxetine (PROZAC) 20 MG capsule, Take 1 capsule (20 mg total) by mouth daily., Disp: 30 capsule, Rfl: 8 .  GLUCOSE BLOOD VI, , Disp: , Rfl:  .  MULTIPLE VITAMIN PO, Take 1 tablet by mouth daily., Disp: , Rfl:  .  QUEtiapine (SEROQUEL) 25 MG tablet, TAKE ONE-HALF TO ONE TABLET BY MOUTH AT BEDTIME, Disp: 30 tablet, Rfl: 8 .  rosuvastatin (CRESTOR) 20 MG tablet, Take 1 tablet (20 mg total) by mouth daily., Disp: 30 tablet, Rfl: 8 .  SitaGLIPtin-MetFORMIN HCl (JANUMET XR) 50-1000 MG TB24, Take 2 tablets by mouth daily., Disp: 60 tablet, Rfl: 8 .  valsartan (DIOVAN) 40 MG tablet, Take  by mouth., Disp: , Rfl:   Allergies  Allergen Reactions  . Ace Inhibitors      ROS  Ten systems reviewed and is negative except as mentioned in HPI   Objective  Vitals:   11/13/16 1416  BP: 132/70  Pulse: 96  Resp: 16  Temp: 98.3 F (36.8 C)  TempSrc: Oral  SpO2: 95%  Weight: 161 lb 8 oz (73.3 kg)  Height: 4\' 11"  (1.499 m)    Body mass index is 32.62 kg/m.  Physical Exam  Constitutional: Patient appears well-developed and well-nourished. Obese  No distress.  HEENT: head atraumatic, normocephalic, pupils equal and reactive to light, , neck supple, throat within normal limits Cardiovascular: Normal rate, regular rhythm and normal heart sounds.  No murmur heard. No BLE edema. Pulmonary/Chest: Effort normal and breath sounds normal. No respiratory distress. Abdominal: Soft.  There is no tenderness. Psychiatric: Patient has a normal mood and affect. behavior seems normal, asking same question  PHQ2/9: Depression screen [facility] 2/9 08/06/2016 04/06/2016 12/12/2015 08/10/2015 07/01/2015  Decreased Interest 0 0 0 0 0  Down, Depressed, Hopeless 0 0 0 0 0  PHQ - 2 Score 0 0 0 0 0     Fall Risk: Fall Risk  08/06/2016 04/06/2016 12/12/2015 08/10/2015 07/01/2015  Falls in the past year? No No No No No  Number falls in past yr: - - - - -  Injury with Fall? - - - - -     Assessment & Plan  1. Uncontrolled type 2 diabetes mellitus with microalbuminuria, without long-term current use of insulin ([place])  Insurance is no longer paying for Vania Rea so daughter called and we switched medication to [place]  2. Osteopenia, unspecified location  She is taking Fosamax now in place of Evista and is tolerating it well

## 2016-11-13 NOTE — Addendum Note (Signed)
Addended by: Lolita Rieger D on: 11/13/2016 03:18 PM   Modules accepted: Orders

## 2016-11-14 ENCOUNTER — Other Ambulatory Visit: Payer: Self-pay | Admitting: Family Medicine

## 2016-11-15 NOTE — Telephone Encounter (Signed)
Our records have Diovan 40 mg daily. Please ask daughter to verify

## 2016-11-16 NOTE — Telephone Encounter (Signed)
Pt is on 40 mg dose

## 2016-11-19 ENCOUNTER — Other Ambulatory Visit: Payer: Self-pay | Admitting: Family Medicine

## 2016-11-19 MED ORDER — VALSARTAN 40 MG PO TABS: 40.0000 mg | ORAL_TABLET | Freq: Every day | ORAL | 2 refills | Status: DC

## 2016-11-21 DIAGNOSIS — R440 Auditory hallucinations: Secondary | ICD-10-CM | POA: Diagnosis not present

## 2016-11-21 DIAGNOSIS — F0390 Unspecified dementia without behavioral disturbance: Secondary | ICD-10-CM | POA: Diagnosis not present

## 2016-12-19 ENCOUNTER — Ambulatory Visit: Payer: Medicare Other | Admitting: Family Medicine

## 2016-12-19 DIAGNOSIS — F2089 Other schizophrenia: Secondary | ICD-10-CM | POA: Diagnosis not present

## 2016-12-26 ENCOUNTER — Other Ambulatory Visit: Payer: Self-pay

## 2016-12-26 NOTE — Telephone Encounter (Addendum)
Patient requesting refill of Valsartan and Farxiga with a 90 day supply instead of 30 to [facility] ([place] Thanks

## 2016-12-27 MED ORDER — VALSARTAN 40 MG PO TABS: 40.0000 mg | ORAL_TABLET | Freq: Every day | ORAL | 1 refills | Status: DC

## 2016-12-27 MED ORDER — DAPAGLIFLOZIN PROPANEDIOL 10 MG PO TABS: 10.0000 mg | ORAL_TABLET | Freq: Every day | ORAL | 1 refills | Status: DC

## 2017-01-20 ENCOUNTER — Emergency Department: Payer: Medicare Other

## 2017-01-20 ENCOUNTER — Encounter: Payer: Self-pay | Admitting: Emergency Medicine

## 2017-01-20 ENCOUNTER — Emergency Department
Admission: EM | Admit: 2017-01-20 | Discharge: 2017-01-20 | Disposition: A | Discharge status: Home / Self Care | Payer: Medicare Other | Attending: Student in an Organized Health Care Education/Training Program | Admitting: Student in an Organized Health Care Education/Training Program

## 2017-01-20 DIAGNOSIS — Z7982 Long term (current) use of aspirin: Secondary | ICD-10-CM | POA: Diagnosis not present

## 2017-01-20 DIAGNOSIS — R55 Syncope and collapse: Secondary | ICD-10-CM

## 2017-01-20 DIAGNOSIS — E860 Dehydration: Secondary | ICD-10-CM | POA: Diagnosis not present

## 2017-01-20 DIAGNOSIS — Z79899 Other long term (current) drug therapy: Secondary | ICD-10-CM | POA: Insufficient documentation

## 2017-01-20 DIAGNOSIS — E119 Type 2 diabetes mellitus without complications: Secondary | ICD-10-CM | POA: Insufficient documentation

## 2017-01-20 DIAGNOSIS — I10 Essential (primary) hypertension: Secondary | ICD-10-CM | POA: Diagnosis not present

## 2017-01-20 LAB — URINALYSIS, COMPLETE (UACMP) WITH MICROSCOPIC
BACTERIA UA: NONE SEEN
Bilirubin Urine: NEGATIVE
Glucose, UA: >=500 mg/dL — AB
HGB URINE DIPSTICK: NEGATIVE
KETONES UR: NEGATIVE mg/dL
LEUKOCYTES UA: NEGATIVE
NITRITE: NEGATIVE
PROTEIN: NEGATIVE mg/dL
SPECIFIC GRAVITY, URINE: 1.029 (ref 1.005–1.030)
pH: 5 (ref 5.0–8.0)

## 2017-01-20 LAB — BASIC METABOLIC PANEL
ANION GAP: 10 (ref 5–15)
BUN: 17 mg/dL (ref 6–20)
CHLORIDE: 104 mmol/L (ref 101–111)
CO2: 25 mmol/L (ref 22–32)
Calcium: 10.2 mg/dL (ref 8.9–10.3)
Creatinine, Ser: 1.11 mg/dL — ABNORMAL HIGH (ref 0.44–1.00)
GFR calc non Af Amer: 50 mL/min — ABNORMAL LOW (ref >60)
GFR, EST AFRICAN AMERICAN: 58 mL/min — AB (ref >60)
Glucose, Bld: 188 mg/dL — ABNORMAL HIGH (ref 65–99)
Potassium: 3.6 mmol/L (ref 3.5–5.1)
Sodium: 139 mmol/L (ref 135–145)

## 2017-01-20 LAB — TROPONIN I: Troponin I: <0.03 ng/mL (ref <0.03)

## 2017-01-20 LAB — CBC
HCT: 35.4 % (ref 35.0–47.0)
Hemoglobin: 11.8 g/dL — ABNORMAL LOW (ref 12.0–16.0)
MCH: 28.7 pg (ref 26.0–34.0)
MCHC: 33.4 g/dL (ref 32.0–36.0)
MCV: 86 fL (ref 80.0–100.0)
Platelets: 161 10*3/uL (ref 150–440)
RBC: 4.12 MIL/uL (ref 3.80–5.20)
RDW: 14.3 % (ref 11.5–14.5)
WBC: 8.2 10*3/uL (ref 3.6–11.0)

## 2017-01-20 MED ORDER — SODIUM CHLORIDE 0.9 % IV BOLUS (SEPSIS)
1000.0000 mL | Freq: Once | INTRAVENOUS | Status: AC
  Administered 2017-01-20: 1000 mL via INTRAVENOUS

## 2017-01-20 NOTE — ED Provider Notes (Signed)
[facility] Emergency Department Provider Note    None    (approximate)  I have reviewed the triage vital signs and the nursing notes.   HISTORY  Chief Complaint Loss of Consciousness    HPI Lindsey [name] is a 66 y.o. female presents with fainting spell that occurred while the patient was using the bathroom. This happened roughly 1 hour prior to arrival. States that she was using the commode got up and started feeling lightheaded. She walked to the door and then fell to the ground and she blacked out. She denies any chest pain or shortness of breath.  Denies any headache. No numbness or tingling. She was at her mother's house and her mother came to check on her. Said that she was still breathing but was not answering questions for a few seconds. There is no shaking activity. The patient's daughter came to the house to check on her where she is still on the ground but was able to get herself up and relate to be brought to the ER.     Past Medical History:  Diagnosis Date  . Acute gout   . Basal cell carcinoma   . Hyperlipidemia   . Hypertension   . Osteoporosis   . Retinopathy    Family History  Problem Relation Age of Onset  . Hypertension Mother   . Osteoporosis Mother   . Cancer Mother     brain tumor  . Hypertension Sister   . Cancer Sister   . Hypertension Sister   . Diabetes Sister   . Cancer Sister 1    breast   Past Surgical History:  Procedure Laterality Date  . CESAREAN SECTION    . TONSILECTOMY, ADENOIDECTOMY, BILATERAL MYRINGOTOMY AND TUBES     Patient Active Problem List   Diagnosis Date Noted  . 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 Uncontrolled type 2 diabetes mellitus with microalbuminuria, without long-term current use of insulin ([place]) 12/12/2015  . Mild dementia 11/01/2015  . Auditory hallucination 07/26/2015  . Anemia of chronic disease 07/05/2015  . Poorly controlled type 2 diabetes mellitus  with cataract ([place]) 04/06/2015  . Cataract 04/06/2015  . Benign essential HTN 04/02/2015  . Controlled gout 04/02/2015  . Dyslipidemia 04/02/2015  . Depression, major, recurrent, mild ([place]) 04/02/2015  . Fibromyalgia syndrome 04/02/2015  . Chronic insomnia 04/02/2015  . Cognitive decline 04/02/2015  . Nodular basal cell carcinoma 04/02/2015  . Morbid obesity ([place]) 04/02/2015  . Osteopenia 04/02/2015  . Calcium blood increased 04/02/2015      Prior to Admission medications   Medication Sig Start Date End Date Taking? Authorizing Provider  alendronate (FOSAMAX) 70 MG tablet Take 1 tablet (70 mg total) by mouth every 7 (seven) days. Take with a full glass of water on an empty stomach. 11/08/16   Steele Sizer, MD  allopurinol (ZYLOPRIM) 100 MG tablet Take 1 tablet (100 mg total) by mouth 2 (two) times daily. 08/06/16   Steele Sizer, MD  aspirin 81 MG chewable tablet Chew 1 tablet by mouth daily. 03/06/07   Historical Provider, MD  BLACK COHOSH PO Take by mouth.    Historical Provider, MD  calcium-vitamin D (OSCAL 500/200 D-3) 500-200 MG-UNIT per tablet Take 1 tablet by mouth daily. 03/06/07   Historical Provider, MD  carvedilol (COREG) 25 MG tablet Take 2 tablets (50 mg total) by mouth daily. 08/06/16   Steele Sizer, MD  dapagliflozin propanediol (FARXIGA) 10 MG TABS tablet Take 10  mg by mouth daily. 12/27/16   Steele Sizer, MD  donepezil (ARICEPT) 5 MG tablet Take 5 mg by mouth at bedtime.    Historical Provider, MD  FLUoxetine (PROZAC) 20 MG capsule Take 1 capsule (20 mg total) by mouth daily. 12/12/15   Steele Sizer, MD  GLUCOSE BLOOD VI  05/26/07   Historical Provider, MD  MULTIPLE VITAMIN PO Take 1 tablet by mouth daily. 12/28/10   Historical Provider, MD  QUEtiapine (SEROQUEL) 25 MG tablet TAKE ONE-HALF TO ONE TABLET BY MOUTH AT BEDTIME 08/06/16   Steele Sizer, MD  rosuvastatin (CRESTOR) 20 MG tablet Take 1 tablet (20 mg total) by mouth daily. 08/06/16   Steele Sizer, MD    SitaGLIPtin-MetFORMIN HCl (JANUMET XR) 50-1000 MG TB24 Take 2 tablets by mouth daily. 08/06/16   Steele Sizer, MD  valsartan (DIOVAN) 40 MG tablet Take 1 tablet (40 mg total) by mouth daily. 12/27/16   Steele Sizer, MD    Allergies Ace inhibitors    Social History Social History  Substance Use Topics  . Smoking status: Never Smoker  . Smokeless tobacco: Never Used  . Alcohol use No    Review of Systems Patient denies headaches, rhinorrhea, blurry vision, numbness, shortness of breath, chest pain, edema, cough, abdominal pain, nausea, vomiting, diarrhea, dysuria, fevers, rashes or hallucinations unless otherwise stated above in HPI. ____________________________________________   PHYSICAL EXAM:  VITAL SIGNS: Vitals:   01/20/17 0223  BP: 121/62  Pulse: 73  Resp: 18  Temp: 97.6 F (36.4 C)    Constitutional: Alert and oriented. Well appearing and in no acute distress. Eyes: Conjunctivae are normal. PERRL. EOMI. Head: Atraumatic. Nose: No congestion/rhinnorhea. Mouth/Throat: Mucous membranes are moist.  Oropharynx non-erythematous. Neck: No stridor. Painless ROM. No cervical spine tenderness to palpation Hematological/Lymphatic/Immunilogical: No cervical lymphadenopathy. Cardiovascular: Normal rate, regular rhythm. Grossly normal heart sounds.  Good peripheral circulation. Respiratory: Normal respiratory effort.  No retractions. Lungs CTAB. Gastrointestinal: Soft and nontender. No distention. No abdominal bruits. No CVA tenderness. Musculoskeletal: No lower extremity tenderness nor edema.  No joint effusions. Neurologic: CN- intact.  No facial droop, Normal FNF.  Normal heel to shin.  Sensation intact bilaterally. Normal speech and language. No gross focal neurologic deficits are appreciated. No gait instability.  Skin:  Skin is warm, dry and intact. No rash noted. Psychiatric: Mood and affect are normal. Speech and behavior are  normal.  ____________________________________________   LABS (all labs ordered are listed, but only abnormal results are displayed)  Results for orders placed or performed during the hospital encounter of 01/20/17 (from the past 24 hour(s))  Basic metabolic panel     Status: Abnormal   Collection Time: 01/20/17  2:22 AM  Result Value Ref Range   Sodium 139 135 - 145 mmol/L   Potassium 3.6 3.5 - 5.1 mmol/L   Chloride 104 101 - 111 mmol/L   CO2 25 22 - 32 mmol/L   Glucose, Bld 188 (H) 65 - 99 mg/dL   BUN 17 6 - 20 mg/dL   Creatinine, Ser 1.11 (H) 0.44 - 1.00 mg/dL   Calcium 10.2 8.9 - 10.3 mg/dL   GFR calc non Af Amer 50 (L) >60 mL/min   GFR calc Af Amer 58 (L) >60 mL/min   Anion gap 10 5 - 15  Urinalysis, Complete w Microscopic     Status: Abnormal   Collection Time: 01/20/17  2:22 AM  Result Value Ref Range   Color, Urine YELLOW (A) YELLOW   APPearance CLEAR (A) CLEAR  Specific Gravity, Urine 1.029 1.005 - 1.030   pH 5.0 5.0 - 8.0   Glucose, UA >=500 (A) NEGATIVE mg/dL   Hgb urine dipstick NEGATIVE NEGATIVE   Bilirubin Urine NEGATIVE NEGATIVE   Ketones, ur NEGATIVE NEGATIVE mg/dL   Protein, ur NEGATIVE NEGATIVE mg/dL   Nitrite NEGATIVE NEGATIVE   Leukocytes, UA NEGATIVE NEGATIVE   RBC / HPF 0-5 0 - 5 RBC/hpf   WBC, UA 0-5 0 - 5 WBC/hpf   Bacteria, UA NONE SEEN NONE SEEN   Squamous Epithelial / LPF 0-5 (A) NONE SEEN   Mucous PRESENT   Troponin I     Status: None   Collection Time: 01/20/17  2:22 AM  Result Value Ref Range   Troponin I <0.03 <0.03 ng/mL  CBC     Status: Abnormal   Collection Time: 01/20/17  3:49 AM  Result Value Ref Range   WBC 8.2 3.6 - 11.0 K/uL   RBC 4.12 3.80 - 5.20 MIL/uL   Hemoglobin 11.8 (L) 12.0 - 16.0 g/dL   HCT 35.4 35.0 - 47.0 %   MCV 86.0 80.0 - 100.0 fL   MCH 28.7 26.0 - 34.0 pg   MCHC 33.4 32.0 - 36.0 g/dL   RDW 14.3 11.5 - 14.5 %   Platelets 161 150 - 440 K/uL   ____________________________________________  EKG My review and  personal interpretation at Time: 2:21   Indication: syncope  Rate: 75  Rhythm: sinus Axis: normal Other: normal intervals, no st elevations or depressions ____________________________________________  RADIOLOGY  I personally reviewed all radiographic images ordered to evaluate for the above acute complaints and reviewed radiology reports and findings.  These findings were personally discussed with the patient.  Please see medical record for radiology report.  ____________________________________________   PROCEDURES  Procedure(s) performed:  Procedures    Critical Care performed: no ____________________________________________   INITIAL IMPRESSION / ASSESSMENT AND PLAN / ED COURSE  Pertinent labs & imaging results that were available during my care of the patient were reviewed by me and considered in my medical decision making (see chart for details).  DDX: dehydration, dysrhythmia, ich, electrolyte abn  JESI JURGENS is a 66 y.o. who presents to the ED with  Chief complaint of syncopal event as described above.  Patient is AFVSS in ED. Exam as above. Given current presentation have considered the above differential.  Blood work is otherwise reassuring. CT imaging ordered to evaluate for any evidence of head injury as she did have a fall and could not exclude trauma. CT imaging without acute abnormality. EKG shows no evidence of dysrhythmia or ischemia.  Clinical Course as of Jan 21 740  Sun Jan 20, 2017  3875 Patient was able to tolerate PO and was able to ambulate with a steady gait.  Have discussed with the patient and available family all diagnostics and treatments performed thus far and all questions were answered to the best of my ability. The patient demonstrates understanding and agreement with plan.   [PR]    Clinical Course User Index [PR] Merlyn Lot, MD     ____________________________________________   FINAL CLINICAL IMPRESSION(S) / ED  DIAGNOSES  Final diagnoses:  Fainting spell  Dehydration      NEW MEDICATIONS STARTED DURING THIS VISIT:  New Prescriptions   No medications on file     Note:  This document was prepared using Dragon voice recognition software and may include unintentional dictation errors.    Merlyn Lot, MD 01/20/17 [phone]

## 2017-01-20 NOTE — ED Triage Notes (Addendum)
Pt presents about 1 hour after syncopal episode at home; pt says she remembers going into the bathroom, turned around too fast and passed out; pt's mother lives with her and heard pt fall; pt denies injury; denies pain; denies headache or chest pain; denies neurological changes; pt alert and oriented x 3; talking in complete coherent sentences; pt's daughter here with pt and says her mother looks pale; pt denies dark/tarry stools or any bleeding episodes

## 2017-01-20 NOTE — ED Notes (Signed)
Reviewed d/c instructions, follow-up care with patient. Pt verbalized understanding.  

## 2017-01-20 NOTE — ED Notes (Signed)
Ambulated patient per MD [facility] request.  Patient O2 saturation, heart rate/rhythm, and respirations all remained WNL. No distress noted, patient denied dizziness/lightheadedness.

## 2017-02-18 DIAGNOSIS — R413 Other amnesia: Secondary | ICD-10-CM | POA: Diagnosis not present

## 2017-02-18 DIAGNOSIS — R440 Auditory hallucinations: Secondary | ICD-10-CM | POA: Diagnosis not present

## 2017-03-06 ENCOUNTER — Other Ambulatory Visit: Payer: Self-pay

## 2017-03-06 DIAGNOSIS — E1136 Type 2 diabetes mellitus with diabetic cataract: Secondary | ICD-10-CM

## 2017-03-06 DIAGNOSIS — E785 Hyperlipidemia, unspecified: Secondary | ICD-10-CM

## 2017-03-06 DIAGNOSIS — E1129 Type 2 diabetes mellitus with other diabetic kidney complication: Secondary | ICD-10-CM

## 2017-03-06 DIAGNOSIS — IMO0002 Reserved for concepts with insufficient information to code with codable children: Secondary | ICD-10-CM

## 2017-03-06 DIAGNOSIS — R809 Proteinuria, unspecified: Secondary | ICD-10-CM

## 2017-03-06 DIAGNOSIS — E1165 Type 2 diabetes mellitus with hyperglycemia: Secondary | ICD-10-CM

## 2017-03-06 NOTE — Telephone Encounter (Signed)
Pharmacy faxed [place] requesting 90 day supply of Rosuvastatin and Janumet to [facility].

## 2017-03-07 MED ORDER — SITAGLIPTIN PHOS-METFORMIN HCL 50-1000 MG PO TABS: 1.0000 | ORAL_TABLET | Freq: Two times a day (BID) | ORAL | 0 refills | Status: DC

## 2017-03-07 MED ORDER — ROSUVASTATIN CALCIUM 20 MG PO TABS
20.0000 mg | ORAL_TABLET | Freq: Every day | ORAL | 0 refills | Status: DC
Start: 2017-03-07 — End: 2017-04-01

## 2017-03-07 NOTE — Telephone Encounter (Signed)
30 day supply provided. Patient has follow up appointment with Dr. Ancil Boozer on 04/01/2017.

## 2017-03-27 ENCOUNTER — Encounter: Payer: Self-pay | Admitting: Family Medicine

## 2017-03-27 DIAGNOSIS — E119 Type 2 diabetes mellitus without complications: Secondary | ICD-10-CM | POA: Diagnosis not present

## 2017-03-27 DIAGNOSIS — F29 Unspecified psychosis not due to a substance or known physiological condition: Secondary | ICD-10-CM | POA: Diagnosis not present

## 2017-03-27 LAB — HM DIABETES EYE EXAM

## 2017-04-01 ENCOUNTER — Ambulatory Visit (INDEPENDENT_AMBULATORY_CARE_PROVIDER_SITE_OTHER): Payer: Medicare Other | Admitting: Family Medicine

## 2017-04-01 ENCOUNTER — Ambulatory Visit (INDEPENDENT_AMBULATORY_CARE_PROVIDER_SITE_OTHER): Payer: Medicare Other

## 2017-04-01 ENCOUNTER — Encounter: Payer: Self-pay | Admitting: Family Medicine

## 2017-04-01 VITALS — BP 138/72 | HR 76 | Temp 98.4°F | Ht 59.0 in | Wt 159.8 lb

## 2017-04-01 VITALS — BP 138/72 | HR 76 | Temp 98.4°F | Resp 16 | Ht 59.0 in | Wt 159.5 lb

## 2017-04-01 DIAGNOSIS — F03A0 Unspecified dementia, mild, without behavioral disturbance, psychotic disturbance, mood disturbance, and anxiety: Secondary | ICD-10-CM

## 2017-04-01 DIAGNOSIS — E1165 Type 2 diabetes mellitus with hyperglycemia: Secondary | ICD-10-CM

## 2017-04-01 DIAGNOSIS — F330 Major depressive disorder, recurrent, mild: Secondary | ICD-10-CM | POA: Diagnosis not present

## 2017-04-01 DIAGNOSIS — M109 Gout, unspecified: Secondary | ICD-10-CM

## 2017-04-01 DIAGNOSIS — E1129 Type 2 diabetes mellitus with other diabetic kidney complication: Secondary | ICD-10-CM | POA: Diagnosis not present

## 2017-04-01 DIAGNOSIS — D638 Anemia in other chronic diseases classified elsewhere: Secondary | ICD-10-CM | POA: Diagnosis not present

## 2017-04-01 DIAGNOSIS — F0391 Unspecified dementia with behavioral disturbance: Secondary | ICD-10-CM | POA: Diagnosis not present

## 2017-04-01 DIAGNOSIS — N183 Chronic kidney disease, stage 3 unspecified: Secondary | ICD-10-CM

## 2017-04-01 DIAGNOSIS — E1136 Type 2 diabetes mellitus with diabetic cataract: Secondary | ICD-10-CM | POA: Diagnosis not present

## 2017-04-01 DIAGNOSIS — Z0000 Encounter for general adult medical examination without abnormal findings: Secondary | ICD-10-CM

## 2017-04-01 DIAGNOSIS — F0390 Unspecified dementia without behavioral disturbance: Secondary | ICD-10-CM | POA: Diagnosis not present

## 2017-04-01 DIAGNOSIS — E785 Hyperlipidemia, unspecified: Secondary | ICD-10-CM

## 2017-04-01 DIAGNOSIS — R440 Auditory hallucinations: Secondary | ICD-10-CM | POA: Diagnosis not present

## 2017-04-01 DIAGNOSIS — R809 Proteinuria, unspecified: Secondary | ICD-10-CM | POA: Diagnosis not present

## 2017-04-01 DIAGNOSIS — M8580 Other specified disorders of bone density and structure, unspecified site: Secondary | ICD-10-CM

## 2017-04-01 DIAGNOSIS — E669 Obesity, unspecified: Secondary | ICD-10-CM | POA: Diagnosis not present

## 2017-04-01 DIAGNOSIS — I10 Essential (primary) hypertension: Secondary | ICD-10-CM

## 2017-04-01 DIAGNOSIS — IMO0002 Reserved for concepts with insufficient information to code with codable children: Secondary | ICD-10-CM

## 2017-04-01 LAB — POCT GLYCOSYLATED HEMOGLOBIN (HGB A1C): Hemoglobin A1C: 7.6

## 2017-04-01 MED ORDER — DAPAGLIFLOZIN PROPANEDIOL 10 MG PO TABS: 10.0000 mg | ORAL_TABLET | Freq: Every day | ORAL | 5 refills | Status: DC

## 2017-04-01 MED ORDER — ROSUVASTATIN CALCIUM 20 MG PO TABS: 20.0000 mg | ORAL_TABLET | Freq: Every day | ORAL | 5 refills | Status: DC

## 2017-04-01 MED ORDER — CARVEDILOL 25 MG PO TABS: 25.0000 mg | ORAL_TABLET | Freq: Two times a day (BID) | ORAL | 5 refills | Status: DC

## 2017-04-01 MED ORDER — QUETIAPINE FUMARATE 50 MG PO TABS: 50.0000 mg | ORAL_TABLET | Freq: Two times a day (BID) | ORAL | 0 refills | Status: DC

## 2017-04-01 MED ORDER — ALENDRONATE SODIUM 70 MG PO TABS: 70.0000 mg | ORAL_TABLET | ORAL | 5 refills | Status: DC

## 2017-04-01 MED ORDER — DONEPEZIL HCL 10 MG PO TABS: 10.0000 mg | ORAL_TABLET | Freq: Every day | ORAL | 0 refills | Status: DC

## 2017-04-01 MED ORDER — ALLOPURINOL 100 MG PO TABS: 100.0000 mg | ORAL_TABLET | Freq: Every day | ORAL | 5 refills | Status: DC

## 2017-04-01 MED ORDER — JANUMET XR 50-1000 MG PO TB24: 1.0000 | ORAL_TABLET | Freq: Every day | ORAL | 5 refills | Status: DC

## 2017-04-01 NOTE — Progress Notes (Addendum)
Subjective:   Lindsey [name] is a 66 y.o. female who presents for Medicare Annual (Subsequent) preventive examination.  Review of Systems:  N/A  Cardiac Risk Factors include: advanced age (>55men, >77 women);diabetes mellitus;dyslipidemia;hypertension;obesity (BMI >30kg/m2)     Objective:     Vitals: BP 138/72 (BP Location: Left Arm)   Pulse 76   Temp 98.4 F (36.9 C) (Oral)   Ht 4\' 11"  (1.499 m)   Wt 159 lb 12.8 oz (72.5 kg)   BMI 32.28 kg/m   Body mass index is 32.28 kg/m.   Tobacco History  Smoking Status  . Never Smoker  Smokeless Tobacco  . Never Used     Counseling given: Not Answered   Past Medical History:  Diagnosis Date  . Acute gout   . Basal cell carcinoma   . Hyperlipidemia   . Hypertension   . Osteoporosis   . Retinopathy    Past Surgical History:  Procedure Laterality Date  . CESAREAN SECTION    . TONSILECTOMY, ADENOIDECTOMY, BILATERAL MYRINGOTOMY AND TUBES     Family History  Problem Relation Age of Onset  . Hypertension Mother   . Osteoporosis Mother   . Cancer Mother        brain tumor  . Hypertension Sister   . Cancer Sister   . Hypertension Sister   . Diabetes Sister   . Cancer Sister 31       breast   History  Sexual Activity  . Sexual activity: Not Currently    Outpatient Encounter Prescriptions as of 04/01/2017  Medication Sig  . alendronate (FOSAMAX) 70 MG tablet Take 1 tablet (70 mg total) by mouth every 7 (seven) days. Take with a full glass of water on an empty stomach.  Marland Kitchen allopurinol (ZYLOPRIM) 100 MG tablet Take 1 tablet (100 mg total) by mouth 2 (two) times daily. (Patient taking differently: Take 100 mg by mouth daily. )  . aspirin 81 MG chewable tablet Chew 1 tablet by mouth daily.  Marland Kitchen BLACK COHOSH PO Take by mouth.  . calcium-vitamin D (OSCAL 500/200 D-3) 500-200 MG-UNIT per tablet Take 1 tablet by mouth daily.  . carvedilol (COREG) 25 MG tablet Take 2 tablets (50 mg total) by mouth daily. (Patient taking  differently: Take 25 mg by mouth 2 (two) times daily with a meal. )  . dapagliflozin propanediol (FARXIGA) 10 MG TABS tablet Take 10 mg by mouth daily.  Marland Kitchen donepezil (ARICEPT) 5 MG tablet Take 10 mg by mouth at bedtime.   Marland Kitchen GLUCOSE BLOOD VI   . JANUMET XR 50-1000 MG TB24 daily.   . Melatonin 3 MG TABS Take 3 mg by mouth at bedtime.   . MULTIPLE VITAMIN PO Take 1 tablet by mouth daily.  . QUEtiapine (SEROQUEL) 25 MG tablet TAKE ONE-HALF TO ONE TABLET BY MOUTH AT BEDTIME (Patient taking differently: 25 mg 2 (two) times daily. TAKE ONE-HALF TO ONE TABLET BY MOUTH AT BEDTIME)  . rosuvastatin (CRESTOR) 20 MG tablet Take 1 tablet (20 mg total) by mouth daily.  . sitaGLIPtin-metformin (JANUMET) 50-1000 MG tablet Take 1 tablet by mouth 2 (two) times daily with a meal. (Patient taking differently: Take 1 tablet by mouth daily. )  . valsartan (DIOVAN) 40 MG tablet Take 1 tablet (40 mg total) by mouth daily.  . [DISCONTINUED] FLUoxetine (PROZAC) 20 MG capsule Take 1 capsule (20 mg total) by mouth daily. (Patient not taking: Reported on 04/01/2017)   No facility-administered encounter medications on file as of 04/01/2017.  Activities of Daily Living In your present state of health, do you have any difficulty performing the following activities: 04/01/2017 08/06/2016  Hearing? N N  Vision? N N  Difficulty concentrating or making decisions? [name]  Walking or climbing stairs? N N  Dressing or bathing? N N  Doing errands, shopping? N Y  Conservation officer, nature and eating ? N -  Using the Toilet? N -  In the past six months, have you accidently leaked urine? Y -  Do you have problems with loss of bowel control? N -  Managing your Medications? Y -  Managing your Finances? Y -  Housekeeping or managing your Housekeeping? N -  Some recent data might be hidden    Patient Care Team: Steele Sizer, MD as PCP - General (Family Medicine)    Assessment:     Exercise Activities and Dietary  recommendations Current Exercise Habits: The patient does not participate in regular exercise at present, Exercise limited by: None identified  Goals    . Increase water intake          Recommend increasing water intake to 6 glasses a day.       Fall Risk Fall Risk  04/01/2017 08/06/2016 04/06/2016 12/12/2015 08/10/2015  Falls in the past year? Yes No No No No  Number falls in past yr: 1 - - - -  Injury with Fall? No - - - -  Follow up Falls prevention discussed - - - -   Depression Screen PHQ 2/9 Scores 04/01/2017 04/01/2017 08/06/2016 04/06/2016  PHQ - 2 Score 0 0 0 0  PHQ- 9 Score 2 - - -     Cognitive Function     6CIT Screen 04/01/2017  What Year? 0 points  What month? 0 points  What time? 0 points  Count back from 20 0 points  Months in reverse 2 points  Repeat phrase 10 points  Total Score 12    Immunization History  Administered Date(s) Administered  . Influenza Split 03/06/2007  . Influenza, High Dose Seasonal PF 07/01/2015, 08/10/2015, 08/06/2016  . Influenza-Unspecified 11/19/2014  . Pneumococcal Conjugate-13 04/06/2016  . Pneumococcal Polysaccharide-23 09/03/2006, 11/19/2014  . Tdap 11/19/2014  . Zoster 11/19/2014   Screening Tests Health Maintenance  Topic Date Due  . HEMOGLOBIN A1C  02/04/2017  . FOOT EXAM  04/06/2017  . INFLUENZA VACCINE  05/15/2017  . OPHTHALMOLOGY EXAM  03/27/2018  . MAMMOGRAM  10/02/2018  . COLONOSCOPY  04/25/2023  . TETANUS/TDAP  11/19/2024  . DEXA SCAN  Completed  . Hepatitis C Screening  Completed  . PNA vac Low Risk Adult  Completed      Plan:  I have personally reviewed and addressed the Medicare Annual Wellness questionnaire and have noted the following in the patient's chart:  A. Medical and social history B. Use of alcohol, tobacco or illicit drugs  C. Current medications and supplements D. Functional ability and status E.  Nutritional status F.  Physical activity G. Advance directives H. List of other  physicians I.  Hospitalizations, surgeries, and ER visits in previous 12 months J.  [place] such as hearing and vision if needed, cognitive and depression L. Referrals and appointments - none  In addition, I have reviewed and discussed with patient certain preventive protocols, quality metrics, and best practice recommendations. A written personalized care plan for preventive services as well as general preventive health recommendations were provided to patient.  See attached scanned questionnaire for additional information.   Signed,  Fabio Neighbors, LPN Nurse Health Advisor   MD Recommendations: Needs Hgb A1c checked today.   I have reviewed this encounter including the documentation in this note and/or discussed this patient with the provider, Fabio Neighbors, LPN am certifying that I agree with the content of this note as supervising physician.  Steele Sizer, MD [facility] 04/22/2017, 8:02 AM

## 2017-04-01 NOTE — Patient Instructions (Signed)
Lindsey [name] , Thank you for taking time to come for your Medicare Wellness Visit. I appreciate your ongoing commitment to your health goals. Please review the following plan we discussed and let me know if I can assist you in the future.   Screening recommendations/referrals: Colonoscopy: completed 04/24/13, due 04/2023 Mammogram: completed 10/03/16, due 09/2017 Bone Density: completed 12/20/14 Recommended yearly ophthalmology/optometry visit for glaucoma screening and checkup Recommended yearly dental visit for hygiene and checkup  Vaccinations: Influenza vaccine: up to date, due 06/2017 Pneumococcal vaccine: completed series Tdap vaccine: completed 11/19/14, due 11/2024 Shingles vaccine: completed 11/19/14   Advanced directives: Advance directive discussed with you today. Even though you declined this today please call our office should you change your mind and we can give you the proper paperwork for you to fill out.  Conditions/risks identified: Obesity; Recommend increasing water intake to 6 glasses a day.   Next appointment: None, need to schedule 1 year AWV.   Preventive Care 68 Years and Older, Female Preventive care refers to lifestyle choices and visits with your health care provider that can promote health and wellness. What does preventive care include?  A yearly physical exam. This is also called an annual well check.  Dental exams once or twice a year.  Routine eye exams. Ask your health care provider how often you should have your eyes checked.  Personal lifestyle choices, including:  Daily care of your teeth and gums.  Regular physical activity.  Eating a healthy diet.  Avoiding tobacco and drug use.  Limiting alcohol use.  Practicing safe sex.  Taking low-dose aspirin every day.  Taking vitamin and mineral supplements as recommended by your health care provider. What happens during an annual well check? The services and screenings done by your health  care provider during your annual well check will depend on your age, overall health, lifestyle risk factors, and family history of disease. Counseling  Your health care provider may ask you questions about your:  Alcohol use.  Tobacco use.  Drug use.  Emotional well-being.  Home and relationship well-being.  Sexual activity.  Eating habits.  History of falls.  Memory and ability to understand (cognition).  Work and work Statistician.  Reproductive health. Screening  You may have the following tests or measurements:  Height, weight, and BMI.  Blood pressure.  Lipid and cholesterol levels. These may be checked every 5 years, or more frequently if you are over 96 years old.  Skin check.  Lung cancer screening. You may have this screening every year starting at age 62 if you have a 30-pack-year history of smoking and currently smoke or have quit within the past 15 years.  Fecal occult blood test (FOBT) of the stool. You may have this test every year starting at age 51.  Flexible sigmoidoscopy or colonoscopy. You may have a sigmoidoscopy every 5 years or a colonoscopy every 10 years starting at age 51.  Hepatitis C blood test.  Hepatitis B blood test.  Sexually transmitted disease (STD) testing.  Diabetes screening. This is done by checking your blood sugar (glucose) after you have not eaten for a while (fasting). You may have this done every 1-3 years.  Bone density scan. This is done to screen for osteoporosis. You may have this done starting at age 62.  Mammogram. This may be done every 1-2 years. Talk to your health care provider about how often you should have regular mammograms. Talk with your health care provider about your test results, treatment  options, and if necessary, the need for more tests. Vaccines  Your health care provider may recommend certain vaccines, such as:  Influenza vaccine. This is recommended every year.  Tetanus, diphtheria, and  acellular pertussis (Tdap, Td) vaccine. You may need a Td booster every 10 years.  Zoster vaccine. You may need this after age 35.  Pneumococcal 13-valent conjugate (PCV13) vaccine. One dose is recommended after age 64.  Pneumococcal polysaccharide (PPSV23) vaccine. One dose is recommended after age 9. Talk to your health care provider about which screenings and vaccines you need and how often you need them. This information is not intended to replace advice given to you by your health care provider. Make sure you discuss any questions you have with your health care provider. Document Released: 10/28/2015 Document Revised: 06/20/2016 Document Reviewed: 08/02/2015 Elsevier Interactive Patient Education  2017 [place] Prevention in the Home Falls can cause injuries. They can happen to people of all ages. There are many things you can do to make your home safe and to help prevent falls. What can I do on the outside of my home?  Regularly fix the edges of walkways and driveways and fix any cracks.  Remove anything that might make you trip as you walk through a door, such as a raised step or threshold.  Trim any bushes or trees on the path to your home.  Use bright outdoor lighting.  Clear any walking paths of anything that might make someone trip, such as rocks or tools.  Regularly check to see if handrails are loose or broken. Make sure that both sides of any steps have handrails.  Any raised decks and porches should have guardrails on the edges.  Have any leaves, snow, or ice cleared regularly.  Use sand or salt on walking paths during winter.  Clean up any spills in your garage right away. This includes oil or grease spills. What can I do in the bathroom?  Use night lights.  Install grab bars by the toilet and in the tub and shower. Do not use towel bars as grab bars.  Use non-skid mats or decals in the tub or shower.  If you need to sit down in the shower, use  a plastic, non-slip stool.  Keep the floor dry. Clean up any water that spills on the floor as soon as it happens.  Remove soap buildup in the tub or shower regularly.  Attach bath mats securely with double-sided non-slip rug tape.  Do not have throw rugs and other things on the floor that can make you trip. What can I do in the bedroom?  Use night lights.  Make sure that you have a light by your bed that is easy to reach.  Do not use any sheets or blankets that are too big for your bed. They should not hang down onto the floor.  Have a firm chair that has side arms. You can use this for support while you get dressed.  Do not have throw rugs and other things on the floor that can make you trip. What can I do in the kitchen?  Clean up any spills right away.  Avoid walking on wet floors.  Keep items that you use a lot in easy-to-reach places.  If you need to reach something above you, use a strong step stool that has a grab bar.  Keep electrical cords out of the way.  Do not use floor polish or wax that makes floors slippery.  If you must use wax, use non-skid floor wax.  Do not have throw rugs and other things on the floor that can make you trip. What can I do with my stairs?  Do not leave any items on the stairs.  Make sure that there are handrails on both sides of the stairs and use them. Fix handrails that are broken or loose. Make sure that handrails are as long as the stairways.  Check any carpeting to make sure that it is firmly attached to the stairs. Fix any carpet that is loose or worn.  Avoid having throw rugs at the top or bottom of the stairs. If you do have throw rugs, attach them to the floor with carpet tape.  Make sure that you have a light switch at the top of the stairs and the bottom of the stairs. If you do not have them, ask someone to add them for you. What else can I do to help prevent falls?  Wear shoes that:  Do not have high heels.  Have  rubber bottoms.  Are comfortable and fit you well.  Are closed at the toe. Do not wear sandals.  If you use a stepladder:  Make sure that it is fully opened. Do not climb a closed stepladder.  Make sure that both sides of the stepladder are locked into place.  Ask someone to hold it for you, if possible.  Clearly mark and make sure that you can see:  Any grab bars or handrails.  First and last steps.  Where the edge of each step is.  Use tools that help you move around (mobility aids) if they are needed. These include:  Canes.  Walkers.  Scooters.  Crutches.  Turn on the lights when you go into a dark area. Replace any light bulbs as soon as they burn out.  Set up your furniture so you have a clear path. Avoid moving your furniture around.  If any of your floors are uneven, fix them.  If there are any pets around you, be aware of where they are.  Review your medicines with your doctor. Some medicines can make you feel dizzy. This can increase your chance of falling. Ask your doctor what other things that you can do to help prevent falls. This information is not intended to replace advice given to you by your health care provider. Make sure you discuss any questions you have with your health care provider. Document Released: 07/28/2009 Document Revised: 03/08/2016 Document Reviewed: 11/05/2014 Elsevier Interactive Patient Education  2017 [employer].

## 2017-04-01 NOTE — Progress Notes (Signed)
Name: Lindsey [name]   MRN: [id number]    DOB: 17-Feb-1951   Date:04/01/2017       Progress Note  Subjective  Chief Complaint  Chief Complaint  Patient presents with  . Diabetes    follow up    HPI  DMII: insurance was no  longer covering [place], but daughter called and we changed to [place], she has been tolerating medication well, no side effects, she is also on Janumet. Her daughter dispenses her medications for her, no polyphagia, polydipsia or polyuria. hgbA1C has gone up a little, daughter states she is eating more junk food and sweets.   Osteopenia: she was on Evista, but switched to Fosamax because of insurance changes. She is doing well, but reviewed how to take fosamax with her during the visit.   Auditory Hallucination: seen by Dr. Melrose Nakayama, and labs normal. Recently went to back to Dr. Melrose Nakayama and Dr. Marthenia Rolling and Seroquel was increased to 50 twice daily and also Aricept, the reason was increase in daytime episodes of hallucinations. She just started medication change today.   HTN: bp is at goal today,  she denies dizziness, chest pain, or increase in  SOB - only when walking long distance. She fell back in April when she got up at night to use bathroom, told by [facility] related to orthostatic changes has been stable and no more episodes since  Hyperlipidemia: taking Crestor and denies side effects of medication, no myalgia.   Major Depression Mild: taking medication, gets anxious and irritable at times, sleeping well now. No crying spells.   Mild Dementia: seen by Dr. Melrose Nakayama Less hallucinations, taking medications as prescribed. She still lives alone, daughter is managing her bills, doctors visits, and she also dispenses her medications. Still gets forgetful.  Just started carrying for her own mother this week - cooking, cleaning and giving her a bath   Patient Active Problem List   Diagnosis Date Noted  . 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  Uncontrolled type 2 diabetes mellitus with microalbuminuria, without long-term current use of insulin ([place]) 12/12/2015  . Mild dementia 11/01/2015  . Auditory hallucination 07/26/2015  . Anemia of chronic disease 07/05/2015  . Poorly controlled type 2 diabetes mellitus with cataract ([place]) 04/06/2015  . Cataract 04/06/2015  . Benign essential HTN 04/02/2015  . Controlled gout 04/02/2015  . Dyslipidemia 04/02/2015  . Depression, major, recurrent, mild ([place]) 04/02/2015  . Fibromyalgia syndrome 04/02/2015  . Chronic insomnia 04/02/2015  . Cognitive decline 04/02/2015  . Nodular basal cell carcinoma 04/02/2015  . Morbid obesity ([place]) 04/02/2015  . Osteopenia 04/02/2015  . Calcium blood increased 04/02/2015    Past Surgical History:  Procedure Laterality Date  . CESAREAN SECTION    . TONSILECTOMY, ADENOIDECTOMY, BILATERAL MYRINGOTOMY AND TUBES      Family History  Problem Relation Age of Onset  . Hypertension Mother   . Osteoporosis Mother   . Cancer Mother        brain tumor  . Hypertension Sister   . Cancer Sister   . Hypertension Sister   . Diabetes Sister   . Cancer Sister 51       breast    Social History   Social History  . Marital status: Divorced    Spouse name: N/A  . Number of children: N/A  . Years of education: N/A   Occupational History  . Not on file.   Social History Main Topics  . Smoking status: Never Smoker  .  Smokeless tobacco: Never Used  . Alcohol use No  . Drug use: No  . Sexual activity: Not Currently   Other Topics Concern  . Not on file   Social History Narrative  . No narrative on file     Current Outpatient Prescriptions:  .  alendronate (FOSAMAX) 70 MG tablet, Take 1 tablet (70 mg total) by mouth every 7 (seven) days. Take with a full glass of water on an empty stomach., Disp: 4 tablet, Rfl: 5 .  allopurinol (ZYLOPRIM) 100 MG tablet, Take 1 tablet (100 mg total) by mouth daily., Disp: 30 tablet, Rfl: 5 .  aspirin 81 MG chewable  tablet, Chew 1 tablet by mouth daily., Disp: , Rfl:  .  BLACK COHOSH PO, Take by mouth., Disp: , Rfl:  .  calcium-vitamin D (OSCAL 500/200 D-3) 500-200 MG-UNIT per tablet, Take 1 tablet by mouth daily., Disp: , Rfl:  .  carvedilol (COREG) 25 MG tablet, Take 1 tablet (25 mg total) by mouth 2 (two) times daily with a meal., Disp: 60 tablet, Rfl: 5 .  dapagliflozin propanediol (FARXIGA) 10 MG TABS tablet, Take 10 mg by mouth daily., Disp: 30 tablet, Rfl: 5 .  donepezil (ARICEPT) 10 MG tablet, Take 1 tablet (10 mg total) by mouth at bedtime., Disp: 30 tablet, Rfl: 0 .  GLUCOSE BLOOD VI, , Disp: , Rfl:  .  JANUMET XR 50-1000 MG TB24, Take 1 tablet by mouth daily., Disp: 30 tablet, Rfl: 5 .  Melatonin 3 MG TABS, Take 3 mg by mouth at bedtime. , Disp: , Rfl:  .  MULTIPLE VITAMIN PO, Take 1 tablet by mouth daily., Disp: , Rfl:  .  QUEtiapine (SEROQUEL) 50 MG tablet, Take 1 tablet (50 mg total) by mouth 2 (two) times daily., Disp: 60 tablet, Rfl: 0 .  rosuvastatin (CRESTOR) 20 MG tablet, Take 1 tablet (20 mg total) by mouth daily., Disp: 30 tablet, Rfl: 5 .  valsartan (DIOVAN) 40 MG tablet, Take 1 tablet (40 mg total) by mouth daily., Disp: 90 tablet, Rfl: 1  Allergies  Allergen Reactions  . Ace Inhibitors      ROS  Constitutional: Negative for fever or weight change.  Respiratory: Negative for cough and shortness of breath.   Cardiovascular: Negative for chest pain or palpitations.  Gastrointestinal: Negative for abdominal pain, no bowel changes.  Musculoskeletal: Negative for gait problem or joint swelling.  Skin: Negative for rash.  Neurological: Negative for dizziness or headache.  No other specific complaints in a complete review of systems (except as listed in HPI above).  Objective  Vitals:   04/01/17 1537  BP: 138/72  Pulse: 76  Resp: 16  Temp: 98.4 F (36.9 C)  Weight: 159 lb 8 oz (72.3 kg)  Height: 4\' 11"  (1.499 m)    Body mass index is 32.22 kg/m.  Physical  Exam  Constitutional: Patient appears well-developed and well-nourished. Obese  No distress.  HEENT: head atraumatic, normocephalic, pupils equal and reactive to light,neck supple, throat within normal limits Cardiovascular: Normal rate, regular rhythm and normal heart sounds.  No murmur heard. No BLE edema. Varicose veins Pulmonary/Chest: Effort normal and breath sounds normal. No respiratory distress. Abdominal: Soft.  There is no tenderness. Psychiatric: Patient has a normal mood and affect. behavior is normal. Poor memory, daughter had to give history    Recent Results (from the past 2160 hour(s))  Basic metabolic panel     Status: Abnormal   Collection Time: 01/20/17  2:22 AM  Result  Value Ref Range   Sodium 139 135 - 145 mmol/L   Potassium 3.6 3.5 - 5.1 mmol/L   Chloride 104 101 - 111 mmol/L   CO2 25 22 - 32 mmol/L   Glucose, Bld 188 (H) 65 - 99 mg/dL   BUN 17 6 - 20 mg/dL   Creatinine, Ser 1.11 (H) 0.44 - 1.00 mg/dL   Calcium 10.2 8.9 - 10.3 mg/dL   GFR calc non Af Amer 50 (L) >60 mL/min   GFR calc Af Amer 58 (L) >60 mL/min    Comment: (NOTE) The eGFR has been calculated using the CKD EPI equation. This calculation has not been validated in all clinical situations. eGFR's persistently <60 mL/min signify possible Chronic Kidney Disease.    Anion gap 10 5 - 15  Urinalysis, Complete w Microscopic     Status: Abnormal   Collection Time: 01/20/17  2:22 AM  Result Value Ref Range   Color, Urine YELLOW (A) YELLOW   APPearance CLEAR (A) CLEAR   Specific Gravity, Urine 1.029 1.005 - 1.030   pH 5.0 5.0 - 8.0   Glucose, UA >=500 (A) NEGATIVE mg/dL   Hgb urine dipstick NEGATIVE NEGATIVE   Bilirubin Urine NEGATIVE NEGATIVE   Ketones, ur NEGATIVE NEGATIVE mg/dL   Protein, ur NEGATIVE NEGATIVE mg/dL   Nitrite NEGATIVE NEGATIVE   Leukocytes, UA NEGATIVE NEGATIVE   RBC / HPF 0-5 0 - 5 RBC/hpf   WBC, UA 0-5 0 - 5 WBC/hpf   Bacteria, UA NONE SEEN NONE SEEN   Squamous Epithelial /  LPF 0-5 (A) NONE SEEN   Mucous PRESENT   Troponin I     Status: None   Collection Time: 01/20/17  2:22 AM  Result Value Ref Range   Troponin I <0.03 <0.03 ng/mL  CBC     Status: Abnormal   Collection Time: 01/20/17  3:49 AM  Result Value Ref Range   WBC 8.2 3.6 - 11.0 K/uL   RBC 4.12 3.80 - 5.20 MIL/uL   Hemoglobin 11.8 (L) 12.0 - 16.0 g/dL   HCT 35.4 35.0 - 47.0 %   MCV 86.0 80.0 - 100.0 fL   MCH 28.7 26.0 - 34.0 pg   MCHC 33.4 32.0 - 36.0 g/dL   RDW 14.3 11.5 - 14.5 %   Platelets 161 150 - 440 K/uL  HM DIABETES EYE EXAM     Status: None   Collection Time: 03/27/17 12:00 AM  Result Value Ref Range   HM Diabetic Eye Exam No Retinopathy No Retinopathy    Comment: [facility] George Ina at [facility]  POCT HgB A1C     Status: Abnormal   Collection Time: 04/01/17  4:32 PM  Result Value Ref Range   Hemoglobin A1C 7.6     Diabetic Foot Exam: Diabetic Foot Exam - Simple   Simple Foot Form Diabetic Foot exam was performed with the following findings:  Yes 04/01/2017  4:50 PM  Visual Inspection No deformities, no ulcerations, no other skin breakdown bilaterally:  Yes Sensation Testing Intact to touch and monofilament testing bilaterally:  Yes Pulse Check Posterior Tibialis and Dorsalis pulse intact bilaterally:  Yes Comments      PHQ2/9: Depression screen [facility] 2/9 04/01/2017 04/01/2017 08/06/2016 04/06/2016 12/12/2015  Decreased Interest 0 0 0 0 0  Down, Depressed, Hopeless 0 0 0 0 0  PHQ - 2 Score 0 0 0 0 0  Altered sleeping 0 - - - -  Tired, decreased energy 2 - - - -  Change in appetite 0 - - - -  Feeling bad or failure about yourself  0 - - - -  Trouble concentrating 0 - - - -  Moving slowly or fidgety/restless 0 - - - -  Suicidal thoughts 0 - - - -  PHQ-9 Score 2 - - - -     Fall Risk: Fall Risk  04/01/2017 08/06/2016 04/06/2016 12/12/2015 08/10/2015  Falls in the past year? Yes No No No No  Number falls in past yr: 1 - - - -  Injury with Fall? No - - - -  Follow  up Falls prevention discussed - - - -   Functional Status Survey: Is the patient deaf or have difficulty hearing?: No Does the patient have difficulty seeing, even when wearing glasses/contacts?: No Does the patient have difficulty concentrating, remembering, or making decisions?: Yes Does the patient have difficulty walking or climbing stairs?: No Does the patient have difficulty dressing or bathing?: No Does the patient have difficulty doing errands alone such as visiting a doctor's office or shopping?: Yes    Assessment & Plan  1. Uncontrolled type 2 diabetes mellitus with microalbuminuria, without long-term current use of insulin ([place])  Discussed importance of resuming a diabetic diet, we will monitor for now - POCT HgB A1C - JANUMET XR 50-1000 MG TB24; Take 1 tablet by mouth daily.  Dispense: 30 tablet; Refill: 5 - dapagliflozin propanediol (FARXIGA) 10 MG TABS tablet; Take 10 mg by mouth daily.  Dispense: 30 tablet; Refill: 5 - COMPLETE METABOLIC PANEL WITH GFR  2. Benign essential HTN  - carvedilol (COREG) 25 MG tablet; Take 1 tablet (25 mg total) by mouth 2 (two) times daily with a meal.  Dispense: 60 tablet; Refill: 5 - COMPLETE METABOLIC PANEL WITH GFR  3. Depression, major, recurrent, mild ([place])  Seems to be better  4. Dyslipidemia  - rosuvastatin (CRESTOR) 20 MG tablet; Take 1 tablet (20 mg total) by mouth daily.  Dispense: 30 tablet; Refill: 5 - Lipid panel  5. Dementia with behavioral disturbance, unspecified dementia type  Dose recently adjusted by Dr. Melrose Nakayama - donepezil (ARICEPT) 10 MG tablet; Take 1 tablet (10 mg total) by mouth at bedtime.  Dispense: 30 tablet; Refill: 0  6. Auditory hallucination  Still present and dose of Seroquel adjusted by Neurologist, just started new dose today - QUEtiapine (SEROQUEL) 50 MG tablet; Take 1 tablet (50 mg total) by mouth 2 (two) times daily.  Dispense: 60 tablet; Refill: 0  7. Obesity (BMI 30-39.9)  Discussed with  the patient the risk posed by an increased BMI. Discussed importance of portion control, calorie counting and at least 150 minutes of physical activity weekly. Avoid sweet beverages and drink more water. Eat at least 6 servings of fruit and vegetables daily   8. Mild dementia   9. Poorly controlled type 2 diabetes mellitus with cataract ([place])  She needs to resume diabetic diet  10. Anemia of chronic disease  - Iron, TIBC and Ferritin Panel  11. Chronic kidney disease, stage 3, mod decreased GFR  - COMPLETE METABOLIC PANEL WITH GFR  12. Controlled gout  - allopurinol (ZYLOPRIM) 100 MG tablet; Take 1 tablet (100 mg total) by mouth daily.  Dispense: 30 tablet; Refill: 5  13. Osteopenia, unspecified location  - alendronate (FOSAMAX) 70 MG tablet; Take 1 tablet (70 mg total) by mouth every 7 (seven) days. Take with a full glass of water on an empty stomach.  Dispense: 4 tablet; Refill: 5 - VITAMIN  D 25 Hydroxy (Vit-D Deficiency, Fractures)

## 2017-04-05 ENCOUNTER — Encounter: Payer: Self-pay | Admitting: Family Medicine

## 2017-04-16 ENCOUNTER — Other Ambulatory Visit: Payer: Self-pay | Admitting: Family Medicine

## 2017-04-16 ENCOUNTER — Other Ambulatory Visit: Payer: Self-pay

## 2017-04-16 DIAGNOSIS — D638 Anemia in other chronic diseases classified elsewhere: Secondary | ICD-10-CM | POA: Diagnosis not present

## 2017-04-16 DIAGNOSIS — E785 Hyperlipidemia, unspecified: Secondary | ICD-10-CM | POA: Diagnosis not present

## 2017-04-16 DIAGNOSIS — E1129 Type 2 diabetes mellitus with other diabetic kidney complication: Secondary | ICD-10-CM | POA: Diagnosis not present

## 2017-04-16 DIAGNOSIS — I10 Essential (primary) hypertension: Secondary | ICD-10-CM | POA: Diagnosis not present

## 2017-04-16 DIAGNOSIS — E1165 Type 2 diabetes mellitus with hyperglycemia: Secondary | ICD-10-CM | POA: Diagnosis not present

## 2017-04-16 DIAGNOSIS — N183 Chronic kidney disease, stage 3 (moderate): Secondary | ICD-10-CM | POA: Diagnosis not present

## 2017-04-16 MED ORDER — EMPAGLIFLOZIN 25 MG PO TABS: 25.0000 mg | ORAL_TABLET | Freq: Every day | ORAL | 0 refills | Status: DC

## 2017-04-17 LAB — COMPLETE METABOLIC PANEL WITH GFR
ALT: 24 U/L (ref 6–29)
AST: 29 U/L (ref 10–35)
Albumin: 4 g/dL (ref 3.6–5.1)
Alkaline Phosphatase: 65 U/L (ref 33–130)
BUN: 20 mg/dL (ref 7–25)
CHLORIDE: 105 mmol/L (ref 98–110)
CO2: 28 mmol/L (ref 20–31)
Calcium: 10.3 mg/dL (ref 8.6–10.4)
Creat: 1.06 mg/dL — ABNORMAL HIGH (ref 0.50–0.99)
GFR, EST AFRICAN AMERICAN: 63 mL/min (ref >=60)
GFR, Est Non African American: 54 mL/min — ABNORMAL LOW (ref >=60)
Glucose, Bld: 151 mg/dL — ABNORMAL HIGH (ref 65–99)
POTASSIUM: 4.4 mmol/L (ref 3.5–5.3)
Sodium: 139 mmol/L (ref 135–146)
Total Bilirubin: 0.5 mg/dL (ref 0.2–1.2)
Total Protein: 6.1 g/dL (ref 6.1–8.1)

## 2017-04-17 LAB — IRON,TIBC AND FERRITIN PANEL
%SAT: 12 % (ref 11–50)
Ferritin: 70 ng/mL (ref 20–288)
Iron: 43 ug/dL — ABNORMAL LOW (ref 45–160)
TIBC: 346 ug/dL (ref 250–450)

## 2017-04-17 LAB — VITAMIN D 25 HYDROXY (VIT D DEFICIENCY, FRACTURES): Vit D, 25-Hydroxy: 42 ng/mL (ref 30–100)

## 2017-04-17 LAB — LIPID PANEL
Cholesterol: 104 mg/dL (ref <200)
HDL: 35 mg/dL — AB (ref >50)
LDL CALC: 33 mg/dL (ref <100)
TRIGLYCERIDES: 179 mg/dL — AB (ref <150)
Total CHOL/HDL Ratio: 3 Ratio (ref <5.0)
VLDL: 36 mg/dL — ABNORMAL HIGH (ref <30)

## 2017-05-14 ENCOUNTER — Other Ambulatory Visit: Payer: Self-pay

## 2017-05-14 NOTE — Progress Notes (Unsigned)
Losartan 50 mg 

## 2017-05-29 ENCOUNTER — Other Ambulatory Visit: Payer: Self-pay

## 2017-05-29 MED ORDER — EMPAGLIFLOZIN 25 MG PO TABS: 25.0000 mg | ORAL_TABLET | Freq: Every day | ORAL | 0 refills | Status: DC

## 2017-06-03 ENCOUNTER — Other Ambulatory Visit: Payer: Self-pay

## 2017-06-03 NOTE — Progress Notes (Unsigned)
o

## 2017-06-04 ENCOUNTER — Other Ambulatory Visit: Payer: Self-pay

## 2017-06-04 MED ORDER — LOSARTAN POTASSIUM 50 MG PO TABS
50.0000 mg | ORAL_TABLET | Freq: Every day | ORAL | 3 refills | Status: DC
Start: 2017-06-04 — End: 2018-03-26

## 2017-06-08 ENCOUNTER — Other Ambulatory Visit: Payer: Self-pay | Admitting: Family Medicine

## 2017-06-08 DIAGNOSIS — E1165 Type 2 diabetes mellitus with hyperglycemia: Principal | ICD-10-CM

## 2017-06-08 DIAGNOSIS — R809 Proteinuria, unspecified: Principal | ICD-10-CM

## 2017-06-08 DIAGNOSIS — E1129 Type 2 diabetes mellitus with other diabetic kidney complication: Secondary | ICD-10-CM

## 2017-06-08 DIAGNOSIS — IMO0002 Reserved for concepts with insufficient information to code with codable children: Secondary | ICD-10-CM

## 2017-06-24 DIAGNOSIS — R413 Other amnesia: Secondary | ICD-10-CM | POA: Diagnosis not present

## 2017-08-02 ENCOUNTER — Ambulatory Visit (INDEPENDENT_AMBULATORY_CARE_PROVIDER_SITE_OTHER): Payer: Medicare Other | Admitting: Family Medicine

## 2017-08-02 ENCOUNTER — Encounter: Payer: Self-pay | Admitting: Family Medicine

## 2017-08-02 VITALS — BP 132/74 | HR 95 | Temp 97.6°F | Resp 16 | Ht 59.0 in | Wt 160.5 lb

## 2017-08-02 DIAGNOSIS — R440 Auditory hallucinations: Secondary | ICD-10-CM

## 2017-08-02 DIAGNOSIS — M8580 Other specified disorders of bone density and structure, unspecified site: Secondary | ICD-10-CM

## 2017-08-02 DIAGNOSIS — E1165 Type 2 diabetes mellitus with hyperglycemia: Secondary | ICD-10-CM

## 2017-08-02 DIAGNOSIS — F0391 Unspecified dementia with behavioral disturbance: Secondary | ICD-10-CM

## 2017-08-02 DIAGNOSIS — E1129 Type 2 diabetes mellitus with other diabetic kidney complication: Secondary | ICD-10-CM

## 2017-08-02 DIAGNOSIS — F330 Major depressive disorder, recurrent, mild: Secondary | ICD-10-CM | POA: Diagnosis not present

## 2017-08-02 DIAGNOSIS — E785 Hyperlipidemia, unspecified: Secondary | ICD-10-CM

## 2017-08-02 DIAGNOSIS — IMO0002 Reserved for concepts with insufficient information to code with codable children: Secondary | ICD-10-CM

## 2017-08-02 DIAGNOSIS — Z23 Encounter for immunization: Secondary | ICD-10-CM

## 2017-08-02 DIAGNOSIS — I10 Essential (primary) hypertension: Secondary | ICD-10-CM

## 2017-08-02 DIAGNOSIS — R809 Proteinuria, unspecified: Secondary | ICD-10-CM | POA: Diagnosis not present

## 2017-08-02 DIAGNOSIS — E1136 Type 2 diabetes mellitus with diabetic cataract: Secondary | ICD-10-CM

## 2017-08-02 DIAGNOSIS — M109 Gout, unspecified: Secondary | ICD-10-CM | POA: Diagnosis not present

## 2017-08-02 LAB — POCT GLYCOSYLATED HEMOGLOBIN (HGB A1C): Hemoglobin A1C: 8.1

## 2017-08-02 LAB — POCT UA - MICROALBUMIN: MICROALBUMIN (UR) POC: 20 mg/L

## 2017-08-02 MED ORDER — ROSUVASTATIN CALCIUM 20 MG PO TABS: 20.0000 mg | ORAL_TABLET | Freq: Every day | ORAL | 5 refills | Status: DC

## 2017-08-02 MED ORDER — CARVEDILOL 25 MG PO TABS: 25.0000 mg | ORAL_TABLET | Freq: Two times a day (BID) | ORAL | 5 refills | Status: DC

## 2017-08-02 MED ORDER — ALENDRONATE SODIUM 70 MG PO TABS: 70.0000 mg | ORAL_TABLET | ORAL | 5 refills | Status: DC

## 2017-08-02 MED ORDER — EMPAGLIFLOZIN 25 MG PO TABS: 25.0000 mg | ORAL_TABLET | Freq: Every day | ORAL | 5 refills | Status: DC

## 2017-08-02 MED ORDER — JANUMET XR 50-1000 MG PO TB24: 2.0000 | ORAL_TABLET | Freq: Every day | ORAL | 5 refills | Status: DC

## 2017-08-02 MED ORDER — ALLOPURINOL 100 MG PO TABS: 100.0000 mg | ORAL_TABLET | Freq: Every day | ORAL | 5 refills | Status: DC

## 2017-08-02 NOTE — Patient Instructions (Signed)
Please call back, not sure if she is on Farxiga or Jardiance HbgA1C is up at 8.1% and I increased Janumet to two pills daily, monitor for diarrhea.  She needs to avoid sweets, pasta, potatoes

## 2017-08-02 NOTE — Progress Notes (Signed)
Name: Lindsey [name]   MRN: [id number]    DOB: 12-24-1950   Date:08/02/2017       Progress Note  Subjective  Chief Complaint  Chief Complaint  Patient presents with  . Medication Refill    4 month F/U  . Diabetes    Daughter checks it every other day  . Osteopenia  . Hyperlipidemia  . Depression    Has been good  . Mild Dementia    Daughter Lindsey [name] takes care of medication    HPI  DMII: she is not sure if she is taking [place] of [place] , she does not manage her medication ( daughter is not here today) , she came in with her son Lindsey [name]. HgbA1C is out of control , at 8.1% but does not want to change medications, therefore we will increase dose of Janumet and stay on SGL-2 agonist.  Her daughter dispenses her medications for her, no polyphagia, polydipsia or polyuria.   Osteopenia: she was on Evista, but switched to Fosamax because of insurance changes. She is doing well, but reviewed how to take fosamax with her during the visit.   Auditory Hallucination: seen by Dr. Melrose Nakayama, and labs normal. Recently seen by  Dr. Marthenia Rolling and Seroquel was increased to 50 twice daily and also Aricept, the reason was increase in daytime episodes of hallucinations. She is taking medication and denies side effects  HTN: bp is at goal today, she denies dizziness, chest pain, or increase in SOB.   Hyperlipidemia: taking Crestor and denies side effects of medication, no myalgia.   Major Depression Mild: taking medication, gets anxious and irritable at times, sleeping well now. No crying spells. She states she worries about her siblings.   Mild Dementia: seen by Dr. Melrose Nakayama, she states she continues to have auditory hallucinations, but not as frequent. She is  taking medications as prescribed. She still lives alone, daughter is managing her bills, doctors visits, and she also dispenses her medications. Still gets forgetful.    Patient Active Problem List   Diagnosis Date Noted  . 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 Uncontrolled type 2 diabetes mellitus with microalbuminuria, without long-term current use of insulin ([place]) 12/12/2015  . Mild dementia 11/01/2015  . Auditory hallucination 07/26/2015  . Anemia of chronic disease 07/05/2015  . Poorly controlled type 2 diabetes mellitus with cataract ([place]) 04/06/2015  . Cataract 04/06/2015  . Benign essential HTN 04/02/2015  . Controlled gout 04/02/2015  . Dyslipidemia 04/02/2015  . Depression, major, recurrent, mild ([place]) 04/02/2015  . Fibromyalgia syndrome 04/02/2015  . Chronic insomnia 04/02/2015  . Cognitive decline 04/02/2015  . Nodular basal cell carcinoma 04/02/2015  . Morbid obesity ([place]) 04/02/2015  . Osteopenia 04/02/2015  . Calcium blood increased 04/02/2015    Past Surgical History:  Procedure Laterality Date  . CESAREAN SECTION    . TONSILECTOMY, ADENOIDECTOMY, BILATERAL MYRINGOTOMY AND TUBES      Family History  Problem Relation Age of Onset  . Hypertension Mother   . Osteoporosis Mother   . Cancer Mother        brain tumor  . Hypertension Sister   . Cancer Sister   . Hypertension Sister   . Diabetes Sister   . Cancer Sister 51       breast    Social History   Social History  . Marital status: Divorced    Spouse name: N/A  . Number of children: N/A  . Years of education: N/A  Occupational History  . Not on file.   Social History Main Topics  . Smoking status: Never Smoker  . Smokeless tobacco: Never Used  . Alcohol use No  . Drug use: No  . Sexual activity: Not Currently   Other Topics Concern  . Not on file   Social History Narrative  . No narrative on file     Current Outpatient Prescriptions:  .  aspirin 81 MG chewable tablet, Chew 1 tablet by mouth daily., Disp: , Rfl:  .  BLACK COHOSH PO, Take by mouth., Disp: , Rfl:  .  calcium-vitamin D (OSCAL 500/200 D-3) 500-200 MG-UNIT per tablet, Take 1 tablet by mouth daily., Disp: , Rfl:  .  donepezil  (ARICEPT) 10 MG tablet, Take 1 tablet (10 mg total) by mouth at bedtime., Disp: 30 tablet, Rfl: 0 .  GLUCOSE BLOOD VI, , Disp: , Rfl:  .  losartan (COZAAR) 50 MG tablet, Take 1 tablet (50 mg total) by mouth daily., Disp: 90 tablet, Rfl: 3 .  Melatonin 3 MG TABS, Take 3 mg by mouth at bedtime. , Disp: , Rfl:  .  MULTIPLE VITAMIN PO, Take 1 tablet by mouth daily., Disp: , Rfl:  .  QUEtiapine (SEROQUEL) 50 MG tablet, Take 1 tablet (50 mg total) by mouth 2 (two) times daily., Disp: 60 tablet, Rfl: 0 .  alendronate (FOSAMAX) 70 MG tablet, Take 1 tablet (70 mg total) by mouth every 7 (seven) days. Take with a full glass of water on an empty stomach., Disp: 4 tablet, Rfl: 5 .  allopurinol (ZYLOPRIM) 100 MG tablet, Take 1 tablet (100 mg total) by mouth daily., Disp: 30 tablet, Rfl: 5 .  carvedilol (COREG) 25 MG tablet, Take 1 tablet (25 mg total) by mouth 2 (two) times daily with a meal., Disp: 60 tablet, Rfl: 5 .  empagliflozin (JARDIANCE) 25 MG TABS tablet, Take 25 mg by mouth daily., Disp: 30 tablet, Rfl: 5 .  JANUMET XR 50-1000 MG TB24, Take 2 tablets by mouth daily., Disp: 60 tablet, Rfl: 5 .  rosuvastatin (CRESTOR) 20 MG tablet, Take 1 tablet (20 mg total) by mouth daily., Disp: 30 tablet, Rfl: 5  Allergies  Allergen Reactions  . Ace Inhibitors      ROS  Constitutional: Negative for fever or weight change.  Respiratory: Negative for cough and shortness of breath.   Cardiovascular: Negative for chest pain or palpitations.  Gastrointestinal: Negative for abdominal pain, no bowel changes.  Musculoskeletal: Negative for gait problem or joint swelling.  Skin: Negative for rash.  Neurological: Negative for dizziness or headache.  No other specific complaints in a complete review of systems (except as listed in HPI above).  Objective  Vitals:   08/02/17 1358  BP: 132/74  Pulse: 95  Resp: 16  Temp: 97.6 F (36.4 C)  TempSrc: Oral  SpO2: 97%  Weight: 160 lb 8 oz (72.8 kg)  Height: 4'  11" (1.499 m)    Body mass index is 32.42 kg/m.  Physical Exam  Constitutional: Patient appears well-developed and well-nourished. Obese  No distress.  HEENT: head atraumatic, normocephalic, pupils equal and reactive to light,  neck supple, throat within normal limits Cardiovascular: Normal rate, regular rhythm and normal heart sounds.  No murmur heard. No BLE edema. Pulmonary/Chest: Effort normal and breath sounds normal. No respiratory distress. Abdominal: Soft.  There is no tenderness. Psychiatric: Patient has a normal mood and affect. behavior is normal. Forgetful, but seems coherent.   PHQ2/9: Depression screen [facility] 2/9  08/02/2017 04/01/2017 04/01/2017 08/06/2016 04/06/2016  Decreased Interest 0 0 0 0 0  Down, Depressed, Hopeless 0 0 0 0 0  PHQ - 2 Score 0 0 0 0 0  Altered sleeping - 0 - - -  Tired, decreased energy - 2 - - -  Change in appetite - 0 - - -  Feeling bad or failure about yourself  - 0 - - -  Trouble concentrating - 0 - - -  Moving slowly or fidgety/restless - 0 - - -  Suicidal thoughts - 0 - - -  PHQ-9 Score - 2 - - -     Fall Risk: Fall Risk  08/02/2017 04/01/2017 08/06/2016 04/06/2016 12/12/2015  Falls in the past year? No Yes No No No  Number falls in past yr: - 1 - - -  Comment - - - - -  Injury with Fall? - No - - -  Follow up - Falls prevention discussed - - -      Functional Status Survey: Is the patient deaf or have difficulty hearing?: No Does the patient have difficulty seeing, even when wearing glasses/contacts?: No Does the patient have difficulty concentrating, remembering, or making decisions?: Yes Does the patient have difficulty walking or climbing stairs?: No Does the patient have difficulty dressing or bathing?: No Does the patient have difficulty doing errands alone such as visiting a doctor's office or shopping?: Yes    Assessment & Plan  1. Uncontrolled type 2 diabetes mellitus with microalbuminuria, without long-term current use  of insulin (HCC)  - POCT HgB A1C - JANUMET XR 50-1000 MG TB24; Take 2 tablets by mouth daily.  Dispense: 60 tablet; Refill: 5 - POCT UA - Microalbumin  2. Osteopenia, unspecified location  - alendronate (FOSAMAX) 70 MG tablet; Take 1 tablet (70 mg total) by mouth every 7 (seven) days. Take with a full glass of water on an empty stomach.  Dispense: 4 tablet; Refill: 5  3. Controlled gout  - allopurinol (ZYLOPRIM) 100 MG tablet; Take 1 tablet (100 mg total) by mouth daily.  Dispense: 30 tablet; Refill: 5  4. Benign essential HTN  - carvedilol (COREG) 25 MG tablet; Take 1 tablet (25 mg total) by mouth 2 (two) times daily with a meal.  Dispense: 60 tablet; Refill: 5  5. Auditory hallucination  Continue follow up with neurologist   6. Dyslipidemia  - rosuvastatin (CRESTOR) 20 MG tablet; Take 1 tablet (20 mg total) by mouth daily.  Dispense: 30 tablet; Refill: 5  7. Need for immunization against influenza  - Flu vaccine HIGH DOSE PF (Fluzone High dose)  8. Depression, major, recurrent, mild ([place])   9. Dementia with behavioral disturbance, unspecified dementia type   10. Poorly controlled type 2 diabetes mellitus with cataract [facility])  Sees ophthalmologist

## 2017-08-26 DIAGNOSIS — R413 Other amnesia: Secondary | ICD-10-CM | POA: Diagnosis not present

## 2017-12-25 ENCOUNTER — Other Ambulatory Visit: Payer: Self-pay | Admitting: Family Medicine

## 2017-12-25 DIAGNOSIS — I10 Essential (primary) hypertension: Secondary | ICD-10-CM

## 2017-12-25 DIAGNOSIS — M109 Gout, unspecified: Secondary | ICD-10-CM

## 2017-12-25 NOTE — Telephone Encounter (Signed)
Refill request for general medication. Allopurinol to [facility].   Last office visit: 08/02/2017  No Follow-up on file.

## 2017-12-25 NOTE — Telephone Encounter (Signed)
Hypertension medication request: Carvedilol to [facility]  Last office visit pertaining to hypertension: 08/02/2017   BP Readings from Last 3 Encounters:  08/02/17 132/74  04/01/17 138/72  04/01/17 138/72    Lab Results  Component Value Date   CREATININE 1.06 (H) 04/16/2017   BUN 20 04/16/2017   NA 139 04/16/2017   K 4.4 04/16/2017   CL 105 04/16/2017   CO2 28 04/16/2017     No Follow-up on file.,e

## 2017-12-26 NOTE — Telephone Encounter (Signed)
appt made for May with Dr Ancil Boozer

## 2018-02-23 ENCOUNTER — Other Ambulatory Visit: Payer: Self-pay | Admitting: Family Medicine

## 2018-02-23 DIAGNOSIS — R809 Proteinuria, unspecified: Principal | ICD-10-CM

## 2018-02-23 DIAGNOSIS — IMO0002 Reserved for concepts with insufficient information to code with codable children: Secondary | ICD-10-CM

## 2018-02-23 DIAGNOSIS — E1129 Type 2 diabetes mellitus with other diabetic kidney complication: Secondary | ICD-10-CM

## 2018-02-23 DIAGNOSIS — E1165 Type 2 diabetes mellitus with hyperglycemia: Principal | ICD-10-CM

## 2018-03-12 ENCOUNTER — Ambulatory Visit: Payer: Medicare Other | Admitting: Family Medicine

## 2018-03-20 ENCOUNTER — Other Ambulatory Visit: Payer: Self-pay | Admitting: Family Medicine

## 2018-03-21 MED ORDER — CARVEDILOL 25 MG PO TABS: 25.0000 mg | ORAL_TABLET | Freq: Two times a day (BID) | ORAL | 0 refills | Status: DC

## 2018-03-21 NOTE — Telephone Encounter (Signed)
Patient needs another pills to last her until her appt. 03/26/18 because she is completely out.

## 2018-03-21 NOTE — Telephone Encounter (Signed)
Patient scheduled for 03/26/2018 at 3pm. Patient wants to know if the script will be filled now because she's out.

## 2018-03-21 NOTE — Addendum Note (Signed)
Addended by: Inda Coke on: 03/21/2018 02:02 PM   Modules accepted: Orders

## 2018-03-26 ENCOUNTER — Encounter: Payer: Self-pay | Admitting: Family Medicine

## 2018-03-26 ENCOUNTER — Ambulatory Visit (INDEPENDENT_AMBULATORY_CARE_PROVIDER_SITE_OTHER): Payer: Medicare Other | Admitting: Family Medicine

## 2018-03-26 VITALS — BP 108/74 | HR 111 | Temp 97.8°F | Resp 20 | Ht 59.0 in | Wt 160.1 lb

## 2018-03-26 DIAGNOSIS — N183 Chronic kidney disease, stage 3 unspecified: Secondary | ICD-10-CM

## 2018-03-26 DIAGNOSIS — E1165 Type 2 diabetes mellitus with hyperglycemia: Secondary | ICD-10-CM | POA: Diagnosis not present

## 2018-03-26 DIAGNOSIS — R440 Auditory hallucinations: Secondary | ICD-10-CM

## 2018-03-26 DIAGNOSIS — E611 Iron deficiency: Secondary | ICD-10-CM

## 2018-03-26 DIAGNOSIS — E1136 Type 2 diabetes mellitus with diabetic cataract: Secondary | ICD-10-CM | POA: Diagnosis not present

## 2018-03-26 DIAGNOSIS — M109 Gout, unspecified: Secondary | ICD-10-CM

## 2018-03-26 DIAGNOSIS — Z79899 Other long term (current) drug therapy: Secondary | ICD-10-CM

## 2018-03-26 DIAGNOSIS — E1129 Type 2 diabetes mellitus with other diabetic kidney complication: Secondary | ICD-10-CM

## 2018-03-26 DIAGNOSIS — E785 Hyperlipidemia, unspecified: Secondary | ICD-10-CM

## 2018-03-26 DIAGNOSIS — I10 Essential (primary) hypertension: Secondary | ICD-10-CM | POA: Diagnosis not present

## 2018-03-26 DIAGNOSIS — IMO0002 Reserved for concepts with insufficient information to code with codable children: Secondary | ICD-10-CM

## 2018-03-26 DIAGNOSIS — F330 Major depressive disorder, recurrent, mild: Secondary | ICD-10-CM

## 2018-03-26 DIAGNOSIS — F0391 Unspecified dementia with behavioral disturbance: Secondary | ICD-10-CM | POA: Diagnosis not present

## 2018-03-26 DIAGNOSIS — R809 Proteinuria, unspecified: Secondary | ICD-10-CM

## 2018-03-26 LAB — POCT GLYCOSYLATED HEMOGLOBIN (HGB A1C): HEMOGLOBIN A1C: 8.2 % — AB (ref 4.0–5.6)

## 2018-03-26 MED ORDER — QUETIAPINE FUMARATE 100 MG PO TABS
50.0000 mg | ORAL_TABLET | Freq: Three times a day (TID) | ORAL | Status: DC
Start: 2018-03-26 — End: 2018-07-04

## 2018-03-26 MED ORDER — CARVEDILOL 25 MG PO TABS: 25.0000 mg | ORAL_TABLET | Freq: Two times a day (BID) | ORAL | 1 refills | Status: DC

## 2018-03-26 MED ORDER — ROSUVASTATIN CALCIUM 20 MG PO TABS: 20.0000 mg | ORAL_TABLET | Freq: Every day | ORAL | 1 refills | Status: DC

## 2018-03-26 MED ORDER — GLIPIZIDE ER 2.5 MG PO TB24: 2.5000 mg | ORAL_TABLET | Freq: Every day | ORAL | 0 refills | Status: DC

## 2018-03-26 MED ORDER — JANUMET XR 50-1000 MG PO TB24: 2.0000 | ORAL_TABLET | Freq: Every day | ORAL | 1 refills | Status: DC

## 2018-03-26 MED ORDER — LOSARTAN POTASSIUM 25 MG PO TABS: 25.0000 mg | ORAL_TABLET | Freq: Every day | ORAL | 1 refills | Status: DC

## 2018-03-26 MED ORDER — DONEPEZIL HCL 10 MG PO TABS: 10.0000 mg | ORAL_TABLET | Freq: Every day | ORAL | 1 refills | Status: DC

## 2018-03-26 MED ORDER — EMPAGLIFLOZIN 25 MG PO TABS: 25.0000 mg | ORAL_TABLET | Freq: Every day | ORAL | 1 refills | Status: DC

## 2018-03-26 NOTE — Progress Notes (Signed)
Name: Lindsey [name]   MRN: [id number]    DOB: 1951/01/29   Date:03/26/2018       Progress Note  Subjective  Chief Complaint  Chief Complaint  Patient presents with  . Dizziness    she has been having some episodes of dizziness  . Fall  . Fatigue  . Diabetes  . Depression    HPI   DMII: she is not sure if she is taking Jardiance and Janumet, not compliant with diet, sneaking in [employer].  HgbA1C is out of control , at 8.1% last visit and 8.2% today.  Cannot tolerate injections and we will try low dose glipizide but explained risk of hypoglycemia and needs to take it with food.  She has a pill box with alarm and has been compliant with medication, no polyphagia, but has intermittent polydipsia and polyuria.   Osteopenia: she was on Evista, but switched to Fosamax because of insurance changes. She is doing well, but reviewed how to take fosamax with her during the visit. Unchanged, reviewed how to take medication with daughter and patient today   Auditory Hallucination: seen by Dr. Melrose Nakayama, and labs normal. Recently seen by  Dr. Hendricks Milo  and Seroquel was increased to 50 twice daily and 100 mg at night. She is also on Aricept, the reason was increase in daytime episodes of hallucinations. She is taking medication and denies side effects, discussed referral to psychiatrist.   HTN: bp today is towards low end of normal, recent episode of dizziness and we will go down on losartan and if heart rate improves will decrease dose of carvedilol. No  chest pain, or increase in SOB.   Hyperlipidemia: taking Crestor and denies side effects of medication, no myalgia. Recheck level  Major Depression Mild: taking medication, gets anxious and irritable at times, sleeping well now. No crying spells. She states she worries about her siblings and her mother. She is seeing Dr. Melrose Nakayama, discussed psychiatrist referral but wants to hold off for now  Mild Dementia: seen by Dr. Melrose Nakayama, she states she continues  to have auditory hallucinations, but not as frequent. She is  taking medications as prescribed. She still lives alone, daughter is managing her bills, doctors visits, and she also dispenses her medications. Condition seems to be progressing, especially when she skips medications.   Morbid obesity: multiple risk factors, discussed again importance of compliance with life style modification   Patient Active Problem List   Diagnosis Date Noted  . 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 Uncontrolled type 2 diabetes mellitus with microalbuminuria, without long-term current use of insulin ([place]) 12/12/2015  . Mild dementia 11/01/2015  . Auditory hallucination 07/26/2015  . Anemia of chronic disease 07/05/2015  . Poorly controlled type 2 diabetes mellitus with cataract ([place]) 04/06/2015  . Cataract 04/06/2015  . Benign essential HTN 04/02/2015  . Controlled gout 04/02/2015  . Dyslipidemia 04/02/2015  . Depression, major, recurrent, mild ([place]) 04/02/2015  . Fibromyalgia syndrome 04/02/2015  . Chronic insomnia 04/02/2015  . Cognitive decline 04/02/2015  . Nodular basal cell carcinoma 04/02/2015  . Morbid obesity ([place]) 04/02/2015  . Osteopenia 04/02/2015  . Calcium blood increased 04/02/2015    Past Surgical History:  Procedure Laterality Date  . CESAREAN SECTION    . TONSILECTOMY, ADENOIDECTOMY, BILATERAL MYRINGOTOMY AND TUBES      Family History  Problem Relation Age of Onset  . Hypertension Mother   . Osteoporosis Mother   . Cancer Mother  brain tumor  . Hypertension Sister   . Cancer Sister   . Hypertension Sister   . Diabetes Sister   . Cancer Sister 89       breast    Social History   Socioeconomic History  . Marital status: Divorced    Spouse name: Not on file  . Number of children: Not on file  . Years of education: Not on file  . Highest education level: Not on file  Occupational History  . Not on file  Social Needs  . Financial  resource strain: Not on file  . Food insecurity:    Worry: Not on file    Inability: Not on file  . Transportation needs:    Medical: Not on file    Non-medical: Not on file  Tobacco Use  . Smoking status: Never Smoker  . Smokeless tobacco: Never Used  Substance and Sexual Activity  . Alcohol use: No    Alcohol/week: 0.0 oz  . Drug use: No  . Sexual activity: Not Currently  Lifestyle  . Physical activity:    Days per week: Not on file    Minutes per session: Not on file  . Stress: Not on file  Relationships  . Social connections:    Talks on phone: Not on file    Gets together: Not on file    Attends religious service: Not on file    Active member of club or organization: Not on file    Attends meetings of clubs or organizations: Not on file    Relationship status: Not on file  . Intimate partner violence:    Fear of current or ex partner: Not on file    Emotionally abused: Not on file    Physically abused: Not on file    Forced sexual activity: Not on file  Other Topics Concern  . Not on file  Social History Narrative  . Not on file     Current Outpatient Medications:  .  alendronate (FOSAMAX) 70 MG tablet, Take 1 tablet (70 mg total) by mouth every 7 (seven) days. Take with a full glass of water on an empty stomach., Disp: 4 tablet, Rfl: 5 .  allopurinol (ZYLOPRIM) 100 MG tablet, TAKE ONE TABLET BY MOUTH TWICE DAILY, Disp: 60 tablet, Rfl: 8 .  aspirin 81 MG chewable tablet, Chew 1 tablet by mouth daily., Disp: , Rfl:  .  BLACK COHOSH PO, Take by mouth., Disp: , Rfl:  .  calcium-vitamin D (OSCAL 500/200 D-3) 500-200 MG-UNIT per tablet, Take 1 tablet by mouth daily., Disp: , Rfl:  .  carvedilol (COREG) 25 MG tablet, Take 1 tablet (25 mg total) by mouth 2 (two) times daily with a meal., Disp: 180 tablet, Rfl: 1 .  donepezil (ARICEPT) 10 MG tablet, Take 1 tablet (10 mg total) by mouth at bedtime., Disp: 90 tablet, Rfl: 1 .  empagliflozin (JARDIANCE) 25 MG TABS tablet,  Take 25 mg by mouth daily., Disp: 90 tablet, Rfl: 1 .  GLUCOSE BLOOD VI, , Disp: , Rfl:  .  JANUMET XR 50-1000 MG TB24, Take 2 tablets by mouth daily., Disp: 180 tablet, Rfl: 1 .  losartan (COZAAR) 25 MG tablet, Take 1 tablet (25 mg total) by mouth daily., Disp: 90 tablet, Rfl: 1 .  Melatonin 3 MG TABS, Take 3 mg by mouth at bedtime. , Disp: , Rfl:  .  MULTIPLE VITAMIN PO, Take 1 tablet by mouth daily., Disp: , Rfl:  .  QUEtiapine (SEROQUEL) 100  MG tablet, Take 0.5-1 tablets (50-100 mg total) by mouth 3 (three) times daily. 50 in am, 50 around 4 pm and 1000mg  qhs, Disp: , Rfl:  .  rosuvastatin (CRESTOR) 20 MG tablet, Take 1 tablet (20 mg total) by mouth daily., Disp: 90 tablet, Rfl: 1 .  glipiZIDE (GLUCOTROL XL) 2.5 MG 24 hr tablet, Take 1 tablet (2.5 mg total) by mouth daily with breakfast., Disp: 90 tablet, Rfl: 0  Allergies  Allergen Reactions  . Ace Inhibitors      ROS  Constitutional: Negative for fever or weight change.  Respiratory: Negative for cough but has occasional shortness of breath with activity .   Cardiovascular: Negative for chest pain or palpitations.  Gastrointestinal: Negative for abdominal pain, no bowel changes.  Musculoskeletal: Negative for gait problem or joint swelling.  Skin: Negative for rash.  Neurological: Positive for dizziness ( felt dizzy once and had to sit down, did not fall)  But no  headache.  No other specific complaints in a complete review of systems (except as listed in HPI above).  Objective  Vitals:   03/26/18 1509  BP: 108/74  Pulse: (!) 111  Resp: 20  Temp: 97.8 F (36.6 C)  TempSrc: Oral  SpO2: 96%  Weight: 160 lb 1.6 oz (72.6 kg)  Height: 4\' 11"  (1.499 m)    Body mass index is 32.34 kg/m.  Physical Exam  Constitutional: Patient appears well-developed and well-nourished. Obese No distress.  HEENT: head atraumatic, normocephalic, pupils equal and reactive to light, neck supple, throat within normal limits Cardiovascular:  Normal rate, regular rhythm and normal heart sounds.  No murmur heard. No BLE edema. Pulmonary/Chest: Effort normal and breath sounds normal. No respiratory distress. Abdominal: Soft.  There is no tenderness. Psychiatric: Patient has a normal mood and affect. behavior is normal. Very forgetful, her daughter had to come in to tell the history   Recent Results (from the past 2160 hour(s))  POCT HgB A1C     Status: Abnormal   Collection Time: 03/26/18  3:23 PM  Result Value Ref Range   Hemoglobin A1C 8.2 (A) 4.0 - 5.6 %   HbA1c, POC (prediabetic range)  5.7 - 6.4 %   HbA1c, POC (controlled diabetic range)  0.0 - 7.0 %    Diabetic Foot Exam: Diabetic Foot Exam - Simple   Simple Foot Form Diabetic Foot exam was performed with the following findings:  Yes 03/26/2018  3:51 PM  Visual Inspection No deformities, no ulcerations, no other skin breakdown bilaterally:  Yes Sensation Testing Intact to touch and monofilament testing bilaterally:  Yes Pulse Check Posterior Tibialis and Dorsalis pulse intact bilaterally:  Yes Comments      PHQ2/9: Depression screen [facility] 2/9 03/26/2018 08/02/2017 04/01/2017 04/01/2017 08/06/2016  Decreased Interest 2 0 0 0 0  Down, Depressed, Hopeless 1 0 0 0 0  PHQ - 2 Score 3 0 0 0 0  Altered sleeping 3 - 0 - -  Tired, decreased energy 1 - 2 - -  Change in appetite 0 - 0 - -  Feeling bad or failure about yourself  0 - 0 - -  Trouble concentrating 0 - 0 - -  Moving slowly or fidgety/restless 0 - 0 - -  Suicidal thoughts 0 - 0 - -  PHQ-9 Score 7 - 2 - -  Difficult doing work/chores Not difficult at all - - - -     Fall Risk: Fall Risk  03/26/2018 08/02/2017 04/01/2017 08/06/2016 04/06/2016  Falls in  the past year? Yes No Yes No No  Number falls in past yr: 1 - 1 - -  Comment - - - - -  Injury with Fall? No - No - -  Follow up Falls prevention discussed - Falls prevention discussed - -     Functional Status Survey: Is the patient deaf or have difficulty  hearing?: No Does the patient have difficulty seeing, even when wearing glasses/contacts?: No Does the patient have difficulty concentrating, remembering, or making decisions?: Yes Does the patient have difficulty walking or climbing stairs?: No Does the patient have difficulty dressing or bathing?: No Does the patient have difficulty doing errands alone such as visiting a doctor's office or shopping?: No    Assessment & Plan  1. Uncontrolled type 2 diabetes mellitus with microalbuminuria, without long-term current use of insulin (HCC)  - POCT HgB A1C - empagliflozin (JARDIANCE) 25 MG TABS tablet; Take 25 mg by mouth daily.  Dispense: 90 tablet; Refill: 1 - JANUMET XR 50-1000 MG TB24; Take 2 tablets by mouth daily.  Dispense: 180 tablet; Refill: 1 - losartan (COZAAR) 25 MG tablet; Take 1 tablet (25 mg total) by mouth daily.  Dispense: 90 tablet; Refill: 1 - glipiZIDE (GLUCOTROL XL) 2.5 MG 24 hr tablet; Take 1 tablet (2.5 mg total) by mouth daily with breakfast.  Dispense: 90 tablet; Refill: 0  2. Benign essential HTN  - carvedilol (COREG) 25 MG tablet; Take 1 tablet (25 mg total) by mouth 2 (two) times daily with a meal.  Dispense: 180 tablet; Refill: 1 - COMPLETE METABOLIC PANEL WITH GFR - CBC with Differential/Platelet  3. Dementia with behavioral disturbance, unspecified dementia type  - donepezil (ARICEPT) 10 MG tablet; Take 1 tablet (10 mg total) by mouth at bedtime.  Dispense: 90 tablet; Refill: 1  4. Auditory hallucination  - QUEtiapine (SEROQUEL) 100 MG tablet; Take 0.5-1 tablets (50-100 mg total) by mouth 3 (three) times daily. 50 in am, 50 around 4 pm and 1000mg  qhs  5. Dyslipidemia  - rosuvastatin (CRESTOR) 20 MG tablet; Take 1 tablet (20 mg total) by mouth daily.  Dispense: 90 tablet; Refill: 1 - Lipid panel  6. Controlled gout   7. Depression, major, recurrent, mild ([place])  On high dose seroquel for psychosis but not on SSRI  8. Poorly controlled type 2  diabetes mellitus with cataract ([place])  Continue follow up with eye doctor  9. Chronic kidney disease, stage 3, mod decreased GFR (HCC)  - VITAMIN D 25 Hydroxy (Vit-D Deficiency, Fractures)  10. Morbid obesity ([place])  Discussed with the patient the risk posed by an increased BMI. Discussed importance of portion control, calorie counting and at least 150 minutes of physical activity weekly. Avoid sweet beverages and drink more water. Eat at least 6 servings of fruit and vegetables daily   11. Long-term use of high-risk medication  - Vitamin B12  12. Iron deficiency  - CBC with Differential/Platelet - Iron, TIBC and Ferritin Panel; Future

## 2018-03-28 ENCOUNTER — Telehealth: Payer: Self-pay

## 2018-03-28 NOTE — Telephone Encounter (Signed)
Called to schedule AWV w/ [facility]. LVM requesting returned call.

## 2018-04-03 ENCOUNTER — Other Ambulatory Visit: Payer: Self-pay | Admitting: Family Medicine

## 2018-04-03 ENCOUNTER — Ambulatory Visit (INDEPENDENT_AMBULATORY_CARE_PROVIDER_SITE_OTHER): Payer: Medicare Other

## 2018-04-03 VITALS — BP 122/64 | HR 78 | Resp 16

## 2018-04-03 DIAGNOSIS — N183 Chronic kidney disease, stage 3 (moderate): Secondary | ICD-10-CM | POA: Diagnosis not present

## 2018-04-03 DIAGNOSIS — Z79899 Other long term (current) drug therapy: Secondary | ICD-10-CM | POA: Diagnosis not present

## 2018-04-03 DIAGNOSIS — I10 Essential (primary) hypertension: Secondary | ICD-10-CM

## 2018-04-03 DIAGNOSIS — E785 Hyperlipidemia, unspecified: Secondary | ICD-10-CM | POA: Diagnosis not present

## 2018-04-03 DIAGNOSIS — E611 Iron deficiency: Secondary | ICD-10-CM | POA: Diagnosis not present

## 2018-04-03 MED ORDER — CARVEDILOL 12.5 MG PO TABS: 12.5000 mg | ORAL_TABLET | Freq: Two times a day (BID) | ORAL | 0 refills | Status: DC

## 2018-04-03 NOTE — Progress Notes (Signed)
Patient is here for a blood pressure check. Patient denies chest pain, palpitations, shortness of breath or visual disturbances. At previous visit blood pressure was 108/74 with a heart rate of 111. Today during nurse visit first check blood pressure was 112/68. After resting for 10 minutes it was 122/64 and heart rate was 78. She does take any blood pressure medications.  Notes from visit on 03/26/18:  HTN: bp today is towards low end of normal, recent episode of dizziness and we will go down on losartan and if heart rate improves will decrease dose of carvedilol. No  chest pain, or increase in SOB.   I will change dose to 12.5 mg BID since heart rate could be up because bp is low, recheck one week after the medication change

## 2018-04-04 LAB — COMPLETE METABOLIC PANEL WITH GFR
AG RATIO: 2.2 (calc) (ref 1.0–2.5)
ALBUMIN MSPROF: 4.3 g/dL (ref 3.6–5.1)
ALKALINE PHOSPHATASE (APISO): 78 U/L (ref 33–130)
ALT: 44 U/L — ABNORMAL HIGH (ref 6–29)
AST: 41 U/L — ABNORMAL HIGH (ref 10–35)
BILIRUBIN TOTAL: 0.5 mg/dL (ref 0.2–1.2)
BUN / CREAT RATIO: 12 (calc) (ref 6–22)
BUN: 18 mg/dL (ref 7–25)
CO2: 30 mmol/L (ref 20–32)
Calcium: 10.7 mg/dL — ABNORMAL HIGH (ref 8.6–10.4)
Chloride: 102 mmol/L (ref 98–110)
Creat: 1.53 mg/dL — ABNORMAL HIGH (ref 0.50–0.99)
GFR, Est African American: 40 mL/min/{1.73_m2} — ABNORMAL LOW (ref >=60)
GFR, Est Non African American: 35 mL/min/{1.73_m2} — ABNORMAL LOW (ref >=60)
GLOBULIN: 2 g/dL (ref 1.9–3.7)
Glucose, Bld: 146 mg/dL — ABNORMAL HIGH (ref 65–99)
Potassium: 4.6 mmol/L (ref 3.5–5.3)
SODIUM: 140 mmol/L (ref 135–146)
Total Protein: 6.3 g/dL (ref 6.1–8.1)

## 2018-04-04 LAB — LIPID PANEL
CHOLESTEROL: 102 mg/dL (ref <200)
HDL: 40 mg/dL — AB (ref >50)
LDL Cholesterol (Calc): 36 mg/dL (calc)
Non-HDL Cholesterol (Calc): 62 mg/dL (calc) (ref <130)
TRIGLYCERIDES: 183 mg/dL — AB (ref <150)
Total CHOL/HDL Ratio: 2.6 (calc) (ref <5.0)

## 2018-04-04 LAB — CBC WITH DIFFERENTIAL/PLATELET
Basophils Absolute: 62 cells/uL (ref 0–200)
Basophils Relative: 1 %
EOS ABS: 180 {cells}/uL (ref 15–500)
Eosinophils Relative: 2.9 %
HCT: 35.7 % (ref 35.0–45.0)
HEMOGLOBIN: 12 g/dL (ref 11.7–15.5)
Lymphs Abs: 2319 cells/uL (ref 850–3900)
MCH: 29 pg (ref 27.0–33.0)
MCHC: 33.6 g/dL (ref 32.0–36.0)
MCV: 86.2 fL (ref 80.0–100.0)
MONOS PCT: 7.1 %
MPV: 12.2 fL (ref 7.5–12.5)
NEUTROS ABS: 3199 {cells}/uL (ref 1500–7800)
Neutrophils Relative %: 51.6 %
PLATELETS: 187 10*3/uL (ref 140–400)
RBC: 4.14 10*6/uL (ref 3.80–5.10)
RDW: 13.4 % (ref 11.0–15.0)
TOTAL LYMPHOCYTE: 37.4 %
WBC mixed population: 440 cells/uL (ref 200–950)
WBC: 6.2 10*3/uL (ref 3.8–10.8)

## 2018-04-04 LAB — VITAMIN D 25 HYDROXY (VIT D DEFICIENCY, FRACTURES): Vit D, 25-Hydroxy: 36 ng/mL (ref 30–100)

## 2018-04-04 LAB — VITAMIN B12: VITAMIN B 12: 444 pg/mL (ref 200–1100)

## 2018-04-06 ENCOUNTER — Other Ambulatory Visit: Payer: Self-pay | Admitting: Family Medicine

## 2018-04-06 DIAGNOSIS — N183 Chronic kidney disease, stage 3 unspecified: Secondary | ICD-10-CM

## 2018-04-07 ENCOUNTER — Ambulatory Visit: Payer: Medicare Other

## 2018-04-07 ENCOUNTER — Other Ambulatory Visit: Payer: Self-pay

## 2018-04-07 DIAGNOSIS — E611 Iron deficiency: Secondary | ICD-10-CM

## 2018-04-07 DIAGNOSIS — N183 Chronic kidney disease, stage 3 unspecified: Secondary | ICD-10-CM

## 2018-04-21 DIAGNOSIS — E611 Iron deficiency: Secondary | ICD-10-CM | POA: Diagnosis not present

## 2018-04-21 DIAGNOSIS — N183 Chronic kidney disease, stage 3 (moderate): Secondary | ICD-10-CM | POA: Diagnosis not present

## 2018-04-22 LAB — IRON,TIBC AND FERRITIN PANEL
%SAT: 19 % (ref 16–45)
FERRITIN: 83 ng/mL (ref 16–288)
Iron: 71 ug/dL (ref 45–160)
TIBC: 371 mcg/dL (calc) (ref 250–450)

## 2018-04-22 LAB — BASIC METABOLIC PANEL WITH GFR
BUN/Creatinine Ratio: 13 (calc) (ref 6–22)
BUN: 19 mg/dL (ref 7–25)
CO2: 29 mmol/L (ref 20–32)
CREATININE: 1.43 mg/dL — AB (ref 0.50–0.99)
Calcium: 11.5 mg/dL — ABNORMAL HIGH (ref 8.6–10.4)
Chloride: 102 mmol/L (ref 98–110)
GFR, EST AFRICAN AMERICAN: 44 mL/min/{1.73_m2} — AB (ref >=60)
GFR, EST NON AFRICAN AMERICAN: 38 mL/min/{1.73_m2} — AB (ref >=60)
Glucose, Bld: 162 mg/dL — ABNORMAL HIGH (ref 65–99)
Potassium: 4.3 mmol/L (ref 3.5–5.3)
SODIUM: 141 mmol/L (ref 135–146)

## 2018-04-22 LAB — PARATHYROID HORMONE, INTACT (NO CA): PTH: 19 pg/mL (ref 14–64)

## 2018-04-23 ENCOUNTER — Other Ambulatory Visit: Payer: Self-pay | Admitting: Family Medicine

## 2018-05-05 ENCOUNTER — Other Ambulatory Visit: Payer: Self-pay | Admitting: Family Medicine

## 2018-05-05 DIAGNOSIS — M8580 Other specified disorders of bone density and structure, unspecified site: Secondary | ICD-10-CM

## 2018-06-13 DIAGNOSIS — E113393 Type 2 diabetes mellitus with moderate nonproliferative diabetic retinopathy without macular edema, bilateral: Secondary | ICD-10-CM | POA: Diagnosis not present

## 2018-06-13 LAB — HM DIABETES EYE EXAM

## 2018-06-25 ENCOUNTER — Encounter: Payer: Self-pay | Admitting: Family Medicine

## 2018-06-25 ENCOUNTER — Other Ambulatory Visit: Payer: Self-pay | Admitting: Family Medicine

## 2018-06-25 DIAGNOSIS — E1165 Type 2 diabetes mellitus with hyperglycemia: Principal | ICD-10-CM

## 2018-06-25 DIAGNOSIS — IMO0002 Reserved for concepts with insufficient information to code with codable children: Secondary | ICD-10-CM

## 2018-06-25 DIAGNOSIS — R809 Proteinuria, unspecified: Principal | ICD-10-CM

## 2018-06-25 DIAGNOSIS — E1129 Type 2 diabetes mellitus with other diabetic kidney complication: Secondary | ICD-10-CM

## 2018-07-04 ENCOUNTER — Ambulatory Visit (INDEPENDENT_AMBULATORY_CARE_PROVIDER_SITE_OTHER): Payer: Medicare Other

## 2018-07-04 ENCOUNTER — Ambulatory Visit (INDEPENDENT_AMBULATORY_CARE_PROVIDER_SITE_OTHER): Payer: Medicare Other | Admitting: Family Medicine

## 2018-07-04 ENCOUNTER — Encounter: Payer: Self-pay | Admitting: Family Medicine

## 2018-07-04 VITALS — BP 108/62 | HR 78 | Temp 98.0°F | Resp 14 | Ht 59.0 in | Wt 159.2 lb

## 2018-07-04 DIAGNOSIS — I10 Essential (primary) hypertension: Secondary | ICD-10-CM | POA: Diagnosis not present

## 2018-07-04 DIAGNOSIS — M109 Gout, unspecified: Secondary | ICD-10-CM | POA: Diagnosis not present

## 2018-07-04 DIAGNOSIS — Z1239 Encounter for other screening for malignant neoplasm of breast: Secondary | ICD-10-CM

## 2018-07-04 DIAGNOSIS — R440 Auditory hallucinations: Secondary | ICD-10-CM

## 2018-07-04 DIAGNOSIS — Z1231 Encounter for screening mammogram for malignant neoplasm of breast: Secondary | ICD-10-CM | POA: Diagnosis not present

## 2018-07-04 DIAGNOSIS — N183 Chronic kidney disease, stage 3 unspecified: Secondary | ICD-10-CM

## 2018-07-04 DIAGNOSIS — F0391 Unspecified dementia with behavioral disturbance: Secondary | ICD-10-CM

## 2018-07-04 DIAGNOSIS — Z0000 Encounter for general adult medical examination without abnormal findings: Secondary | ICD-10-CM | POA: Diagnosis not present

## 2018-07-04 DIAGNOSIS — Z7983 Long term (current) use of bisphosphonates: Secondary | ICD-10-CM

## 2018-07-04 DIAGNOSIS — E2839 Other primary ovarian failure: Secondary | ICD-10-CM

## 2018-07-04 DIAGNOSIS — Z23 Encounter for immunization: Secondary | ICD-10-CM

## 2018-07-04 DIAGNOSIS — H524 Presbyopia: Secondary | ICD-10-CM | POA: Diagnosis not present

## 2018-07-04 DIAGNOSIS — R809 Proteinuria, unspecified: Secondary | ICD-10-CM | POA: Diagnosis not present

## 2018-07-04 DIAGNOSIS — E785 Hyperlipidemia, unspecified: Secondary | ICD-10-CM

## 2018-07-04 DIAGNOSIS — E1129 Type 2 diabetes mellitus with other diabetic kidney complication: Secondary | ICD-10-CM | POA: Diagnosis not present

## 2018-07-04 LAB — POCT GLYCOSYLATED HEMOGLOBIN (HGB A1C)
HbA1c POC (<> result, manual entry): 6.9 % (ref 4.0–5.6)
Hemoglobin A1C: 6.9 % — AB (ref 4.0–5.6)

## 2018-07-04 MED ORDER — ROSUVASTATIN CALCIUM 20 MG PO TABS: 20.0000 mg | ORAL_TABLET | Freq: Every day | ORAL | 1 refills | Status: DC

## 2018-07-04 MED ORDER — CARVEDILOL 6.25 MG PO TABS: 6.2500 mg | ORAL_TABLET | Freq: Two times a day (BID) | ORAL | 0 refills | Status: DC

## 2018-07-04 MED ORDER — ALLOPURINOL 100 MG PO TABS: 100.0000 mg | ORAL_TABLET | Freq: Two times a day (BID) | ORAL | 5 refills | Status: DC

## 2018-07-04 MED ORDER — LOSARTAN POTASSIUM 25 MG PO TABS: 25.0000 mg | ORAL_TABLET | Freq: Every day | ORAL | 1 refills | Status: DC

## 2018-07-04 MED ORDER — JANUMET XR 50-1000 MG PO TB24: 2.0000 | ORAL_TABLET | Freq: Every day | ORAL | 1 refills | Status: DC

## 2018-07-04 MED ORDER — GLIPIZIDE ER 2.5 MG PO TB24: ORAL_TABLET | ORAL | 0 refills | Status: DC

## 2018-07-04 MED ORDER — DONEPEZIL HCL 10 MG PO TABS: 10.0000 mg | ORAL_TABLET | Freq: Every day | ORAL | 1 refills | Status: AC

## 2018-07-04 MED ORDER — QUETIAPINE FUMARATE 100 MG PO TABS: 150.0000 mg | ORAL_TABLET | Freq: Three times a day (TID) | ORAL | Status: DC

## 2018-07-04 MED ORDER — EMPAGLIFLOZIN 25 MG PO TABS: 25.0000 mg | ORAL_TABLET | Freq: Every day | ORAL | 1 refills | Status: DC

## 2018-07-04 NOTE — Progress Notes (Signed)
Subjective:   Lindsey [name] is a 67 y.o. female who presents for Medicare Annual (Subsequent) preventive examination.  Review of Systems:  N/A Cardiac Risk Factors include: advanced age (>40men, >47 women);dyslipidemia;diabetes mellitus;hypertension;obesity (BMI >30kg/m2);sedentary lifestyle     Objective:     Vitals: BP 108/62 (BP Location: Left Arm, Patient Position: Sitting, Cuff Size: Normal)   Pulse 78   Temp 98 F (36.7 C) (Oral)   Resp 14   Ht 4\' 11"  (1.499 m)   Wt 159 lb 3.2 oz (72.2 kg)   SpO2 92%   BMI 32.15 kg/m   Body mass index is 32.15 kg/m.  Advanced Directives 07/04/2018 08/02/2017 04/01/2017 01/20/2017 11/13/2016 08/06/2016 04/06/2016  Does Patient Have a Medical Advance Directive? No No No No No No Yes  Type of Advance Directive - - - - - - Press photographer  Does patient want to make changes to medical advance directive? - - - - - - No - Patient declined  Would patient like information on creating a medical advance directive? Yes (MAU/Ambulatory/Procedural Areas - Information given) - No - Patient declined No - Patient declined - No - patient declined information -    Tobacco Social History   Tobacco Use  Smoking Status Never Smoker  Smokeless Tobacco Never Used  Tobacco Comment   smoking cessation materials not required     Counseling given: No Comment: smoking cessation materials not required  Clinical Intake:  Pre-visit preparation completed: Yes  Pain : No/denies pain   BMI - recorded: 32.15 Nutritional Status: BMI > 30  Obese Nutritional Risks: None  Nutrition Risk Assessment: Has the patient had any N/V/D within the last 2 months?  No  Does the patient have any non-healing wounds?  No  Has the patient had any unintentional weight loss or weight gain?  No   Is the patient diabetic?  Yes  If diabetic, was a CBG obtained today?  No  Did the patient bring in their glucometer from home?  No  How often do you monitor your CBG's?  Never  Are you having any financial strains with the device, your supplies or your medication? No .  Does the patient want a Data processing manager Referral sent to the Care Guide for [employer] for their medication(s)?  No   Diabetic Exams: Diabetic Eye Exam: Completed 06/13/18. Diabetic Foot Exam: Completed 03/26/18.   How often do you need to have someone help you when you read instructions, pamphlets, or other written materials from your doctor or pharmacy?: 1 - Never  Interpreter Needed?: No  Information entered by :: Idell Pickles, LPN  Past Medical History:  Diagnosis Date  . Acute gout   . Basal cell carcinoma   . Hyperlipidemia   . Hypertension   . Osteoporosis   . Retinopathy    Past Surgical History:  Procedure Laterality Date  . CESAREAN SECTION    . TONSILECTOMY, ADENOIDECTOMY, BILATERAL MYRINGOTOMY AND TUBES     Family History  Problem Relation Age of Onset  . Hypertension Mother   . Osteoporosis Mother   . Cancer Mother        brain tumor  . Hodgkin's lymphoma Father   . Hypertension Sister   . Cancer Sister        unknown  . Hypertension Sister   . Diabetes Sister   . Cancer Sister 78       breast   Social History   Socioeconomic History  . Marital status: Divorced  Spouse name: Not on file  . Number of children: 3  . Years of education: Not on file  . Highest education level: GED or equivalent  Occupational History    Employer: RETIRED  Social Needs  . Financial resource strain: Not hard at all  . Food insecurity:    Worry: Never true    Inability: Never true  . Transportation needs:    Medical: No    Non-medical: No  Tobacco Use  . Smoking status: Never Smoker  . Smokeless tobacco: Never Used  . Tobacco comment: smoking cessation materials not required  Substance and Sexual Activity  . Alcohol use: No    Alcohol/week: 0.0 standard drinks  . Drug use: No  . Sexual activity: Not Currently  Lifestyle  . Physical activity:     Days per week: 0 days    Minutes per session: 0 min  . Stress: Not at all  Relationships  . Social connections:    Talks on phone: Patient refused    Gets together: Patient refused    Attends religious service: Patient refused    Active member of club or organization: Patient refused    Attends meetings of clubs or organizations: Patient refused    Relationship status: Divorced  Other Topics Concern  . Not on file  Social History Narrative  . Not on file    Outpatient Encounter Medications as of 07/04/2018  Medication Sig  . alendronate (FOSAMAX) 70 MG tablet TAKE 1 TABLET BY MOUTH ONCE A WEEK WITH  A  FULL  GLASS  OF  WATER  ON  AN  EMPTY  STOMACH  . allopurinol (ZYLOPRIM) 100 MG tablet TAKE ONE TABLET BY MOUTH TWICE DAILY  . aspirin 81 MG chewable tablet Chew 1 tablet by mouth daily.  . Black Cohosh 540 MG CAPS Take 1 tablet by mouth daily.  . calcium-vitamin D (OSCAL 500/200 D-3) 500-200 MG-UNIT per tablet Take 1 tablet by mouth daily.  . carvedilol (COREG) 12.5 MG tablet Take 1 tablet (12.5 mg total) by mouth 2 (two) times daily with a meal. New dose, recheck bp one week after change in medication  . donepezil (ARICEPT) 10 MG tablet Take 1 tablet (10 mg total) by mouth at bedtime.  . empagliflozin (JARDIANCE) 25 MG TABS tablet Take 25 mg by mouth daily.  Marland Kitchen glipiZIDE (GLUCOTROL XL) 2.5 MG 24 hr tablet TAKE 1 TABLET BY MOUTH ONCE DAILY WITH  BREAKFAST  . JANUMET XR 50-1000 MG TB24 Take 2 tablets by mouth daily.  Marland Kitchen losartan (COZAAR) 25 MG tablet Take 1 tablet (25 mg total) by mouth daily.  . Melatonin 3 MG TABS Take 3 mg by mouth at bedtime.   . MULTIPLE VITAMIN PO Take 1 tablet by mouth daily.  . QUEtiapine (SEROQUEL) 100 MG tablet Take 0.5-1 tablets (50-100 mg total) by mouth 3 (three) times daily. 50 in am, 50 around 4 pm and 1000mg  qhs  . rosuvastatin (CRESTOR) 20 MG tablet Take 1 tablet (20 mg total) by mouth daily.  Marland Kitchen GLUCOSE BLOOD VI    No facility-administered encounter  medications on file as of 07/04/2018.     Activities of Daily Living In your present state of health, do you have any difficulty performing the following activities: 07/04/2018 03/26/2018  Hearing? N N  Comment denies hearing aids -  Vision? N N  Comment wears eyeglasses -  Difficulty concentrating or making decisions? Y Y  Comment short term memory loss -  Walking or climbing stairs?  Y N  Comment L knee pain -  Dressing or bathing? N N  Doing errands, shopping? Y N  Comment son transports -  Conservation officer, nature and eating ? N -  Comment partial upper dentures -  Using the Toilet? N -  In the past six months, have you accidently leaked urine? N -  Do you have problems with loss of bowel control? N -  Managing your Medications? Y -  Comment family manages -  Managing your Finances? Y -  Comment family manages -  Housekeeping or managing your Housekeeping? N -  Some recent data might be hidden    Patient Care Team: Steele Sizer, MD as PCP - General (Family Medicine) Mendel Corning, MD as Consulting Physician (Psychiatry) Anabel Bene, MD as Consulting Physician (Neurology)    Assessment:   This is a routine wellness examination for [place].  Exercise Activities and Dietary recommendations Current Exercise Habits: The patient does not participate in regular exercise at present, Exercise limited by: None identified  Goals    . Exercise 150 min/wk Moderate Activity     Recommend to exercise for at least 150 minutes per week.    . Increase water intake     Recommend increasing water intake to 6 glasses a day.        Fall Risk Fall Risk  07/04/2018 03/26/2018 08/02/2017 04/01/2017 08/06/2016  Falls in the past year? Yes Yes No Yes No  Number falls in past yr: 1 1 - 1 -  Comment - - - - -  Injury with Fall? No No - No -  Risk for fall due to : Impaired vision;Impaired balance/gait;Medication side effect - - - -  Risk for fall due to: Comment wears eyeglasses; L knee pain  - - - -  Follow up Falls evaluation completed;Education provided;Falls prevention discussed Falls prevention discussed - Falls prevention discussed -   FALL RISK PREVENTION PERTAINING TO HOME: Is your home free of loose throw rugs in walkways, pet beds, electrical cords, etc? No  Is there adequate lighting in your home to reduce risk of falls? Yes  Are there stairs in or around your home WITH handrails? No   ASSISTIVE DEVICES UTILIZED TO PREVENT FALLS: Do you have a life alert? No  Use of a cane, walker or w/c? Yes, cane prn Grab bars in the bathroom? No  Shower chair or a place to sit while bathing? Yes  An elevated toilet seat or a handicapped toilet? No   Timed Get Up and Go Performed: Yes. Pt ambulated 10 feet within 29 sec. Gait slow, steady and without the use of an assistive device. Fall risk prevention has been discussed. No intervention required at this time.  DME Order and Community Resource Referral:  Does the patient want an order for a shower chair or an elevated toilet seat?  No  Does the patient want a Data processing manager Referral sent to the Care Guide for a life alert or installation of grab bars in the shower?  No   Depression Screen PHQ 2/9 Scores 07/04/2018 03/26/2018 08/02/2017 04/01/2017  PHQ - 2 Score 0 3 0 0  PHQ- 9 Score 0 7 - 2     Cognitive Function     6CIT Screen 07/04/2018 04/01/2017  What Year? 4 points 0 points  What month? 0 points 0 points  What time? 3 points 0 points  Count back from 20 0 points 0 points  Months in reverse 4 points 2  points  Repeat phrase 10 points 10 points  Total Score 21 12    Immunization History  Administered Date(s) Administered  . Influenza Split 03/06/2007  . Influenza, High Dose Seasonal PF 07/01/2015, 08/10/2015, 08/06/2016, 08/02/2017, 07/04/2018  . Influenza-Unspecified 11/19/2014  . Pneumococcal Conjugate-13 04/06/2016  . Pneumococcal Polysaccharide-23 09/03/2006, 11/19/2014  . Tdap 11/19/2014  . Zoster  11/19/2014   Qualifies for Shingles Vaccine? Yes  Zostavax completed 11/19/14. Due for Shingrix. Education has been provided regarding the importance of this vaccine. Pt has been advised to call insurance company to determine out of pocket expense. Advised may also receive vaccine at local pharmacy or Health Dept. Verbalized acceptance and understanding.  Screening Tests Health Maintenance  Topic Date Due  . MAMMOGRAM  10/02/2017  . HEMOGLOBIN A1C  09/25/2018  . FOOT EXAM  03/27/2019  . OPHTHALMOLOGY EXAM  06/14/2019  . COLONOSCOPY  04/25/2023  . TETANUS/TDAP  11/19/2024  . INFLUENZA VACCINE  Completed  . DEXA SCAN  Completed  . Hepatitis C Screening  Completed  . PNA vac Low Risk Adult  Completed   Cancer Screenings: Colorectal Screening: Completed 04/24/13. Repeat every 10 years Mammogram: Completed 10/02/16. Repeat every year. Ordered today. Pt provided with contact info and advised to call to schedule appt.  Bone Density: Completed 12/20/14. Taking alendronate. Repeat every 2 years. Ordered today. Pt provided with contact info and advised to call to schedule appt. Lung Cancer Screening: (Low Dose CT Chest recommended if Age 69-80 years, 30 pack-year currently smoking OR have quit w/in 15years.) Does not qualify.   Additional Screening: Hepatitis C Screening: Completed 12/16/15  Dental Exams: Recommended annual dental exams for proper oral hygiene    Plan:  I have personally reviewed and addressed the Medicare Annual Wellness questionnaire and have noted the following in the patient's chart:  A. Medical and social history B. Use of alcohol, tobacco or illicit drugs  C. Current medications and supplements D. Functional ability and status E.  Nutritional status F.  Physical activity G. Advance directives H. List of other physicians I.  Hospitalizations, surgeries, and ER visits in previous 12 months J.  [place] such as hearing and vision if needed, cognitive and  depression L. Referrals and appointments  In addition, I have reviewed and discussed with patient certain preventive protocols, quality metrics, and best practice recommendations. A written personalized care plan for preventive services as well as general preventive health recommendations were provided to patient.  See attached scanned questionnaire for additional information.   Signed,  Aleatha Borer, LPN Nurse Health Advisor

## 2018-07-04 NOTE — Patient Instructions (Signed)
Lindsey [name] , Thank you for taking time to come for your Medicare Wellness Visit. I appreciate your ongoing commitment to your health goals. Please review the following plan we discussed and let me know if I can assist you in the future.   Screening recommendations/referrals: Colorectal Screening: Up to date Mammogram: Please call to schedule your appointment Bone Density: Please call to schedule your appointment  Vision and Dental Exams: Recommended annual ophthalmology exams for early detection of glaucoma and other disorders of the eye Recommended annual dental exams for proper oral hygiene  Diabetic Exams: Diabetic Eye Exam: Up to date Diabetic Foot Exam: Up to date  Vaccinations: Influenza vaccine: Completed today Pneumococcal vaccine: Up to date Tdap vaccine: Up to date Shingles vaccine: Please call your insurance company to determine your out of pocket expense for the Shingrix vaccine. You may receive this vaccine at your local pharmacy.  Advanced directives: Advance directives discussed with you today. I have provided a copy for you to complete at home and have notarized. Once this is complete please bring a copy in to our office so we can scan it into your chart.  Goals: Recommend to exercise for at least 150 minutes per week.  Next appointment: Please schedule your Annual Wellness Visit with your Nurse Health Advisor in one year.  Preventive Care 49 Years and Older, Female Preventive care refers to lifestyle choices and visits with your health care provider that can promote health and wellness. What does preventive care include?  A yearly physical exam. This is also called an annual well check.  Dental exams once or twice a year.  Routine eye exams. Ask your health care provider how often you should have your eyes checked.  Personal lifestyle choices, including:  Daily care of your teeth and gums.  Regular physical activity.  Eating a healthy diet.  Avoiding  tobacco and drug use.  Limiting alcohol use.  Practicing safe sex.  Taking low-dose aspirin every day.  Taking vitamin and mineral supplements as recommended by your health care provider. What happens during an annual well check? The services and screenings done by your health care provider during your annual well check will depend on your age, overall health, lifestyle risk factors, and family history of disease. Counseling  Your health care provider may ask you questions about your:  Alcohol use.  Tobacco use.  Drug use.  Emotional well-being.  Home and relationship well-being.  Sexual activity.  Eating habits.  History of falls.  Memory and ability to understand (cognition).  Work and work Statistician.  Reproductive health. Screening  You may have the following tests or measurements:  Height, weight, and BMI.  Blood pressure.  Lipid and cholesterol levels. These may be checked every 5 years, or more frequently if you are over 55 years old.  Skin check.  Lung cancer screening. You may have this screening every year starting at age 83 if you have a 30-pack-year history of smoking and currently smoke or have quit within the past 15 years.  Fecal occult blood test (FOBT) of the stool. You may have this test every year starting at age 31.  Flexible sigmoidoscopy or colonoscopy. You may have a sigmoidoscopy every 5 years or a colonoscopy every 10 years starting at age 34.  Hepatitis C blood test.  Hepatitis B blood test.  Sexually transmitted disease (STD) testing.  Diabetes screening. This is done by checking your blood sugar (glucose) after you have not eaten for a while (fasting). You  may have this done every 1-3 years.  Bone density scan. This is done to screen for osteoporosis. You may have this done starting at age 68.  Mammogram. This may be done every 1-2 years. Talk to your health care provider about how often you should have regular  mammograms. Talk with your health care provider about your test results, treatment options, and if necessary, the need for more tests. Vaccines  Your health care provider may recommend certain vaccines, such as:  Influenza vaccine. This is recommended every year.  Tetanus, diphtheria, and acellular pertussis (Tdap, Td) vaccine. You may need a Td booster every 10 years.  Zoster vaccine. You may need this after age 32.  Pneumococcal 13-valent conjugate (PCV13) vaccine. One dose is recommended after age 60.  Pneumococcal polysaccharide (PPSV23) vaccine. One dose is recommended after age 2. Talk to your health care provider about which screenings and vaccines you need and how often you need them. This information is not intended to replace advice given to you by your health care provider. Make sure you discuss any questions you have with your health care provider. Document Released: 10/28/2015 Document Revised: 06/20/2016 Document Reviewed: 08/02/2015 Elsevier Interactive Patient Education  2017 [place] Prevention in the Home Falls can cause injuries. They can happen to people of all ages. There are many things you can do to make your home safe and to help prevent falls. What can I do on the outside of my home?  Regularly fix the edges of walkways and driveways and fix any cracks.  Remove anything that might make you trip as you walk through a door, such as a raised step or threshold.  Trim any bushes or trees on the path to your home.  Use bright outdoor lighting.  Clear any walking paths of anything that might make someone trip, such as rocks or tools.  Regularly check to see if handrails are loose or broken. Make sure that both sides of any steps have handrails.  Any raised decks and porches should have guardrails on the edges.  Have any leaves, snow, or ice cleared regularly.  Use sand or salt on walking paths during winter.  Clean up any spills in your garage  right away. This includes oil or grease spills. What can I do in the bathroom?  Use night lights.  Install grab bars by the toilet and in the tub and shower. Do not use towel bars as grab bars.  Use non-skid mats or decals in the tub or shower.  If you need to sit down in the shower, use a plastic, non-slip stool.  Keep the floor dry. Clean up any water that spills on the floor as soon as it happens.  Remove soap buildup in the tub or shower regularly.  Attach bath mats securely with double-sided non-slip rug tape.  Do not have throw rugs and other things on the floor that can make you trip. What can I do in the bedroom?  Use night lights.  Make sure that you have a light by your bed that is easy to reach.  Do not use any sheets or blankets that are too big for your bed. They should not hang down onto the floor.  Have a firm chair that has side arms. You can use this for support while you get dressed.  Do not have throw rugs and other things on the floor that can make you trip. What can I do in the kitchen?  Clean  up any spills right away.  Avoid walking on wet floors.  Keep items that you use a lot in easy-to-reach places.  If you need to reach something above you, use a strong step stool that has a grab bar.  Keep electrical cords out of the way.  Do not use floor polish or wax that makes floors slippery. If you must use wax, use non-skid floor wax.  Do not have throw rugs and other things on the floor that can make you trip. What can I do with my stairs?  Do not leave any items on the stairs.  Make sure that there are handrails on both sides of the stairs and use them. Fix handrails that are broken or loose. Make sure that handrails are as long as the stairways.  Check any carpeting to make sure that it is firmly attached to the stairs. Fix any carpet that is loose or worn.  Avoid having throw rugs at the top or bottom of the stairs. If you do have throw rugs,  attach them to the floor with carpet tape.  Make sure that you have a light switch at the top of the stairs and the bottom of the stairs. If you do not have them, ask someone to add them for you. What else can I do to help prevent falls?  Wear shoes that:  Do not have high heels.  Have rubber bottoms.  Are comfortable and fit you well.  Are closed at the toe. Do not wear sandals.  If you use a stepladder:  Make sure that it is fully opened. Do not climb a closed stepladder.  Make sure that both sides of the stepladder are locked into place.  Ask someone to hold it for you, if possible.  Clearly mark and make sure that you can see:  Any grab bars or handrails.  First and last steps.  Where the edge of each step is.  Use tools that help you move around (mobility aids) if they are needed. These include:  Canes.  Walkers.  Scooters.  Crutches.  Turn on the lights when you go into a dark area. Replace any light bulbs as soon as they burn out.  Set up your furniture so you have a clear path. Avoid moving your furniture around.  If any of your floors are uneven, fix them.  If there are any pets around you, be aware of where they are.  Review your medicines with your doctor. Some medicines can make you feel dizzy. This can increase your chance of falling. Ask your doctor what other things that you can do to help prevent falls. This information is not intended to replace advice given to you by your health care provider. Make sure you discuss any questions you have with your health care provider. Document Released: 07/28/2009 Document Revised: 03/08/2016 Document Reviewed: 11/05/2014 Elsevier Interactive Patient Education  2017 [employer].

## 2018-07-04 NOTE — Progress Notes (Signed)
Name: Lindsey [name]   MRN: [id number]    DOB: 11-Feb-1951   Date:07/04/2018       Progress Note  Subjective  Chief Complaint  Chief Complaint  Patient presents with  . Annual Exam    Just seen Ammie    HPI  DMII:son is here with her today, Beverely Low, and states they have a medication dispenser with an alarm and grandchildren help her also to be compliant. HgbA1C has been out of control 8.1%-8.2% 8.2% today was 6.9% and we will continue current regiment.  Cannot tolerate injections so we added lost dose Glipizide on her last visit in June 2019, she is not checking her glucose at home but denies hypoglycemic episodeShe still drinks pepsi's every other day. She denies polyphagia, polydipsia or polyuria.   Osteopenia: she was on Evista, but switched to Fosamax because of insurance changes. She is doing well, but reviewed how to take fosamax with her during the visit. She is due for repeat bone density test  Auditory Hallucination: seen by Dr. Melrose Nakayama. Recentlyseen byDr. Hendricks Milo  and Seroquel was increased to 150 TID She is also on Aricept, the reason was increase in daytime episodes of hallucinations. She states hallucinations frightens hers.   HTN: bp today is towards low end of normal, denies dizziness, but we will decrease dose of carvedilol to 12.5 mg and monitor No  chest pain, or increase in SOB.  Hyperlipidemia: taking Crestor and denies side effects of medication, no myalgia. Reviewed labs  Major Depression Mild: taking medication, gets anxious and irritable at times, sleeping well now. No crying spells.She states she worries about her siblings and her mother. Her mother moved to [place] to live with another daughter. She is missing her mother now.   Dementia: seen by Dr. Melrose Nakayama, she states she continues to have auditoryhallucinations,but not as frequent. She istaking medications as prescribed. She still lives alone, daughter is managing her bills, doctors visits, and she also  dispenses her medications, also has a alarm that dispenses her medication.    Patient Active Problem List   Diagnosis Date Noted  . 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 Uncontrolled type 2 diabetes mellitus with microalbuminuria, without long-term current use of insulin ([place]) 12/12/2015  . Mild dementia 11/01/2015  . Auditory hallucination 07/26/2015  . Anemia of chronic disease 07/05/2015  . Poorly controlled type 2 diabetes mellitus with cataract ([place]) 04/06/2015  . Cataract 04/06/2015  . Benign essential HTN 04/02/2015  . Controlled gout 04/02/2015  . Dyslipidemia 04/02/2015  . Depression, major, recurrent, mild ([place]) 04/02/2015  . Fibromyalgia syndrome 04/02/2015  . Chronic insomnia 04/02/2015  . Cognitive decline 04/02/2015  . Nodular basal cell carcinoma 04/02/2015  . Morbid obesity ([place]) 04/02/2015  . Osteopenia 04/02/2015  . Calcium blood increased 04/02/2015    Past Surgical History:  Procedure Laterality Date  . CESAREAN SECTION    . TONSILECTOMY, ADENOIDECTOMY, BILATERAL MYRINGOTOMY AND TUBES      Family History  Problem Relation Age of Onset  . Hypertension Mother   . Osteoporosis Mother   . Cancer Mother        brain tumor  . Hodgkin's lymphoma Father   . Hypertension Sister   . Cancer Sister        unknown  . Hypertension Sister   . Diabetes Sister   . Cancer Sister 29       breast    Social History   Socioeconomic History  . Marital status: Divorced  Spouse name: Not on file  . Number of children: 3  . Years of education: Not on file  . Highest education level: GED or equivalent  Occupational History    Employer: RETIRED  Social Needs  . Financial resource strain: Not hard at all  . Food insecurity:    Worry: Never true    Inability: Never true  . Transportation needs:    Medical: No    Non-medical: No  Tobacco Use  . Smoking status: Never Smoker  . Smokeless tobacco: Never Used  . Tobacco comment:  smoking cessation materials not required  Substance and Sexual Activity  . Alcohol use: No    Alcohol/week: 0.0 standard drinks  . Drug use: No  . Sexual activity: Not Currently  Lifestyle  . Physical activity:    Days per week: 0 days    Minutes per session: 0 min  . Stress: Not at all  Relationships  . Social connections:    Talks on phone: Patient refused    Gets together: Patient refused    Attends religious service: Patient refused    Active member of club or organization: Patient refused    Attends meetings of clubs or organizations: Patient refused    Relationship status: Divorced  . Intimate partner violence:    Fear of current or ex partner: No    Emotionally abused: No    Physically abused: No    Forced sexual activity: No  Other Topics Concern  . Not on file  Social History Narrative  . Not on file     Current Outpatient Medications:  .  alendronate (FOSAMAX) 70 MG tablet, TAKE 1 TABLET BY MOUTH ONCE A WEEK WITH  A  FULL  GLASS  OF  WATER  ON  AN  EMPTY  STOMACH, Disp: 12 tablet, Rfl: 1 .  allopurinol (ZYLOPRIM) 100 MG tablet, Take 1 tablet (100 mg total) by mouth 2 (two) times daily., Disp: 60 tablet, Rfl: 5 .  aspirin 81 MG chewable tablet, Chew 1 tablet by mouth daily., Disp: , Rfl:  .  Black Cohosh 540 MG CAPS, Take 1 tablet by mouth daily., Disp: , Rfl:  .  calcium-vitamin D (OSCAL 500/200 D-3) 500-200 MG-UNIT per tablet, Take 1 tablet by mouth daily., Disp: , Rfl:  .  carvedilol (COREG) 6.25 MG tablet, Take 1 tablet (6.25 mg total) by mouth 2 (two) times daily with a meal. New dose, recheck bp one week after change in medication, Disp: 180 tablet, Rfl: 0 .  donepezil (ARICEPT) 10 MG tablet, Take 1 tablet (10 mg total) by mouth at bedtime., Disp: 90 tablet, Rfl: 1 .  empagliflozin (JARDIANCE) 25 MG TABS tablet, Take 25 mg by mouth daily., Disp: 90 tablet, Rfl: 1 .  glipiZIDE (GLUCOTROL XL) 2.5 MG 24 hr tablet, TAKE 1 TABLET BY MOUTH ONCE DAILY WITH  BREAKFAST,  Disp: 90 tablet, Rfl: 0 .  GLUCOSE BLOOD VI, , Disp: , Rfl:  .  JANUMET XR 50-1000 MG TB24, Take 2 tablets by mouth daily., Disp: 180 tablet, Rfl: 1 .  losartan (COZAAR) 25 MG tablet, Take 1 tablet (25 mg total) by mouth daily., Disp: 90 tablet, Rfl: 1 .  Melatonin 3 MG TABS, Take 3 mg by mouth at bedtime. , Disp: , Rfl:  .  MULTIPLE VITAMIN PO, Take 1 tablet by mouth daily., Disp: , Rfl:  .  QUEtiapine (SEROQUEL) 100 MG tablet, Take 1.5 tablets (150 mg total) by mouth 3 (three) times daily. 50 in  am, 50 around 4 pm and 1000mg  qhs, Disp: , Rfl:  .  rosuvastatin (CRESTOR) 20 MG tablet, Take 1 tablet (20 mg total) by mouth daily., Disp: 90 tablet, Rfl: 1  Allergies  Allergen Reactions  . Ace Inhibitors     I personally reviewed active problem list, medication list, allergies, family history, social history with the patient/caregiver today.   ROS  Constitutional: Negative for fever or weight change.  Respiratory: Negative for cough and shortness of breath.   Cardiovascular: Negative for chest pain or palpitations.  Gastrointestinal: Negative for abdominal pain, no bowel changes.  Musculoskeletal: Negative for gait problem or joint swelling.  Skin: Negative for rash.  Neurological: Negative for dizziness or headache.  No other specific complaints in a complete review of systems (except as listed in HPI above).  Objective  Vitals:   07/04/18 1131  BP: 108/62  Pulse: 78  Resp: 14  Temp: 98 F (36.7 C)  TempSrc: Oral  SpO2: 92%  Weight: 159 lb 3.2 oz (72.2 kg)  Height: 4\' 11"  (1.499 m)    Body mass index is 32.15 kg/m.  Physical Exam  Constitutional: Patient appears well-developed and well-nourished. Obese  No distress.  HEENT: head atraumatic, normocephalic, pupils equal and reactive to light, neck supple, throat within normal limits Cardiovascular: Normal rate, regular rhythm and normal heart sounds.  No murmur heard. No BLE edema. Pulmonary/Chest: Effort normal and  breath sounds normal. No respiratory distress. Abdominal: Soft.  There is no tenderness. Psychiatric: Patient has a normal mood and affect. behavior is normal. Judgment and thought content normal. She is forgetful   Recent Results (from the past 2160 hour(s))  BASIC METABOLIC PANEL WITH GFR     Status: Abnormal   Collection Time: 04/21/18  9:26 AM  Result Value Ref Range   Glucose, Bld 162 (H) 65 - 99 mg/dL    Comment: .            Fasting reference interval . For someone without known diabetes, a glucose value >125 mg/dL indicates that they may have diabetes and this should be confirmed with a follow-up test. .    BUN 19 7 - 25 mg/dL   Creat 1.43 (H) 0.50 - 0.99 mg/dL    Comment: For patients >20 years of age, the reference limit for Creatinine is approximately 13% higher for people identified as African-American. .    GFR, Est Non African American 38 (L) > OR = 60 mL/min/1.70m2   GFR, Est African American 44 (L) > OR = 60 mL/min/1.73m2   BUN/Creatinine Ratio 13 6 - 22 (calc)   Sodium 141 135 - 146 mmol/L   Potassium 4.3 3.5 - 5.3 mmol/L   Chloride 102 98 - 110 mmol/L   CO2 29 20 - 32 mmol/L   Calcium 11.5 (H) 8.6 - 10.4 mg/dL  Parathyroid hormone, intact (no Ca)     Status: None   Collection Time: 04/21/18  9:26 AM  Result Value Ref Range   PTH 19 14 - 64 pg/mL    Comment: . Interpretive Guide    Intact PTH           Calcium ------------------    ----------           ------- Normal Parathyroid    Normal               Normal Hypoparathyroidism    Low or Low Normal    Low Hyperparathyroidism    Primary  Normal or High       High    Secondary          High                 Normal or Low    Tertiary           High                 High Non-Parathyroid    Hypercalcemia      Low or Low Normal    High .   Iron, TIBC and Ferritin Panel     Status: None   Collection Time: 04/21/18  9:26 AM  Result Value Ref Range   Iron 71 45 - 160 mcg/dL   TIBC 371 250 - 450  mcg/dL (calc)   %SAT 19 16 - 45 % (calc)   Ferritin 83 16 - 288 ng/mL  [facility] DIABETES EYE EXAM     Status: Abnormal   Collection Time: 06/13/18 12:00 AM  Result Value Ref Range   HM Diabetic Eye Exam Retinopathy (A) No Retinopathy    Comment: Background Retin-No Treatment Needed, [facility]-Dr. Porfilio  POCT HgB A1C     Status: Abnormal   Collection Time: 07/04/18  3:59 PM  Result Value Ref Range   Hemoglobin A1C 6.9 (A) 4.0 - 5.6 %   HbA1c POC (<> result, manual entry) 6.9 4.0 - 5.6 %   HbA1c, POC (prediabetic range)     HbA1c, POC (controlled diabetic range)        PHQ2/9: Depression screen [facility] 2/9 07/04/2018 03/26/2018 08/02/2017 04/01/2017 04/01/2017  Decreased Interest 0 2 0 0 0  Down, Depressed, Hopeless 0 1 0 0 0  PHQ - 2 Score 0 3 0 0 0  Altered sleeping 0 3 - 0 -  Tired, decreased energy 0 1 - 2 -  Change in appetite 0 0 - 0 -  Feeling bad or failure about yourself  0 0 - 0 -  Trouble concentrating 0 0 - 0 -  Moving slowly or fidgety/restless 0 0 - 0 -  Suicidal thoughts 0 0 - 0 -  PHQ-9 Score 0 7 - 2 -  Difficult doing work/chores Not difficult at all Not difficult at all - - -     Fall Risk: Fall Risk  07/04/2018 03/26/2018 08/02/2017 04/01/2017 08/06/2016  Falls in the past year? Yes Yes No Yes No  Number falls in past yr: 1 1 - 1 -  Comment - - - - -  Injury with Fall? No No - No -  Risk for fall due to : Impaired vision;Impaired balance/gait;Medication side effect - - - -  Risk for fall due to: Comment wears eyeglasses; L knee pain - - - -  Follow up Falls evaluation completed;Education provided;Falls prevention discussed Falls prevention discussed - Falls prevention discussed -      Assessment & Plan  1. Type 2 diabetes mellitus with microalbuminuria, without long-term current use of insulin (HCC)  We may need to adjust the dose of janumet if CKI continues to drop and Jardiance - losartan (COZAAR) 25 MG tablet; Take 1 tablet (25 mg total) by mouth daily.   Dispense: 90 tablet; Refill: 1 - JANUMET XR 50-1000 MG TB24; Take 2 tablets by mouth daily.  Dispense: 180 tablet; Refill: 1 - glipiZIDE (GLUCOTROL XL) 2.5 MG 24 hr tablet; TAKE 1 TABLET BY MOUTH ONCE DAILY WITH  BREAKFAST  Dispense: 90 tablet; Refill: 0 - empagliflozin (JARDIANCE)  25 MG TABS tablet; Take 25 mg by mouth daily.  Dispense: 90 tablet; Refill: 1 - POCT HgB A1C  2. Hypercalcemia  - COMPLETE METABOLIC PANEL WITH GFR - Parathyroid hormone, intact (no Ca)  Recheck levels, referral placed for endo but she did not get an appointment we will check on that today   3. Controlled gout  - allopurinol (ZYLOPRIM) 100 MG tablet; Take 1 tablet (100 mg total) by mouth 2 (two) times daily.  Dispense: 60 tablet; Refill: 5  4. Auditory hallucination  - QUEtiapine (SEROQUEL) 100 MG tablet; Take 1.5 tablets (150 mg total) by mouth 3 (three) times daily. 50 in am, 50 around 4 pm and 1000mg  qhs  5. Dementia with behavioral disturbance, unspecified dementia type  - donepezil (ARICEPT) 10 MG tablet; Take 1 tablet (10 mg total) by mouth at bedtime.  Dispense: 90 tablet; Refill: 1  6. Dyslipidemia  - rosuvastatin (CRESTOR) 20 MG tablet; Take 1 tablet (20 mg total) by mouth daily.  Dispense: 90 tablet; Refill: 1  7. Benign essential HTN  - carvedilol (COREG) 6.25 MG tablet; Take 1 tablet (6.25 mg total) by mouth 2 (two) times daily with a meal. New dose, recheck bp one week after change in medication  Dispense: 180 tablet; Refill: 0 - COMPLETE METABOLIC PANEL WITH GFR  8. Chronic kidney disease, stage 3, mod decreased GFR (HCC)  - COMPLETE METABOLIC PANEL WITH GFR - Parathyroid hormone, intact (no Ca)

## 2018-07-07 ENCOUNTER — Other Ambulatory Visit: Payer: Self-pay | Admitting: Family Medicine

## 2018-07-07 DIAGNOSIS — M109 Gout, unspecified: Secondary | ICD-10-CM

## 2018-07-07 LAB — COMPLETE METABOLIC PANEL WITH GFR
AG Ratio: 2.3 (calc) (ref 1.0–2.5)
ALBUMIN MSPROF: 4.3 g/dL (ref 3.6–5.1)
ALT: 33 U/L — ABNORMAL HIGH (ref 6–29)
AST: 29 U/L (ref 10–35)
Alkaline phosphatase (APISO): 72 U/L (ref 33–130)
BUN/Creatinine Ratio: 10 (calc) (ref 6–22)
BUN: 15 mg/dL (ref 7–25)
CALCIUM: 11 mg/dL — AB (ref 8.6–10.4)
CO2: 31 mmol/L (ref 20–32)
CREATININE: 1.48 mg/dL — AB (ref 0.50–0.99)
Chloride: 103 mmol/L (ref 98–110)
GFR, EST AFRICAN AMERICAN: 42 mL/min/{1.73_m2} — AB (ref >=60)
GFR, EST NON AFRICAN AMERICAN: 36 mL/min/{1.73_m2} — AB (ref >=60)
GLOBULIN: 1.9 g/dL (ref 1.9–3.7)
Glucose, Bld: 90 mg/dL (ref 65–99)
Potassium: 4 mmol/L (ref 3.5–5.3)
SODIUM: 143 mmol/L (ref 135–146)
Total Bilirubin: 0.5 mg/dL (ref 0.2–1.2)
Total Protein: 6.2 g/dL (ref 6.1–8.1)

## 2018-07-07 LAB — PARATHYROID HORMONE, INTACT (NO CA): PTH: 31 pg/mL (ref 14–64)

## 2018-07-15 DIAGNOSIS — M8580 Other specified disorders of bone density and structure, unspecified site: Secondary | ICD-10-CM | POA: Diagnosis not present

## 2018-10-10 ENCOUNTER — Other Ambulatory Visit: Payer: Self-pay | Admitting: Family Medicine

## 2018-10-10 DIAGNOSIS — I10 Essential (primary) hypertension: Secondary | ICD-10-CM

## 2018-10-10 NOTE — Telephone Encounter (Signed)
Spoke with [name] ( daughter) and she is coming in on Dec. 31 2019 to see Raelyn Ensign . [facility]owles is out

## 2018-10-10 NOTE — Telephone Encounter (Signed)
Hypertension medication request: Carvedilol to [facility].   Last office visit pertaining to hypertension: 07/04/2018   BP Readings from Last 3 Encounters:  07/04/18 108/62  07/04/18 108/62  04/03/18 122/64    Lab Results  Component Value Date   CREATININE 1.48 (H) 07/04/2018   BUN 15 07/04/2018   NA 143 07/04/2018   K 4.0 07/04/2018   CL 103 07/04/2018   CO2 31 07/04/2018    Needs an appointment scheduled around 11/03/2017

## 2018-10-14 ENCOUNTER — Encounter: Payer: Self-pay | Admitting: Family Medicine

## 2018-10-14 ENCOUNTER — Ambulatory Visit (INDEPENDENT_AMBULATORY_CARE_PROVIDER_SITE_OTHER): Payer: Medicare Other | Admitting: Family Medicine

## 2018-10-14 VITALS — BP 112/68 | HR 100 | Temp 97.6°F | Resp 18 | Ht 59.0 in | Wt 161.4 lb

## 2018-10-14 DIAGNOSIS — I10 Essential (primary) hypertension: Secondary | ICD-10-CM

## 2018-10-14 DIAGNOSIS — M8580 Other specified disorders of bone density and structure, unspecified site: Secondary | ICD-10-CM | POA: Diagnosis not present

## 2018-10-14 DIAGNOSIS — F330 Major depressive disorder, recurrent, mild: Secondary | ICD-10-CM

## 2018-10-14 DIAGNOSIS — F0280 Dementia in other diseases classified elsewhere without behavioral disturbance: Secondary | ICD-10-CM

## 2018-10-14 DIAGNOSIS — R809 Proteinuria, unspecified: Secondary | ICD-10-CM | POA: Diagnosis not present

## 2018-10-14 DIAGNOSIS — E785 Hyperlipidemia, unspecified: Secondary | ICD-10-CM

## 2018-10-14 DIAGNOSIS — E1129 Type 2 diabetes mellitus with other diabetic kidney complication: Secondary | ICD-10-CM | POA: Diagnosis not present

## 2018-10-14 MED ORDER — CARVEDILOL 6.25 MG PO TABS: 6.2500 mg | ORAL_TABLET | Freq: Two times a day (BID) | ORAL | 0 refills | Status: DC

## 2018-10-14 MED ORDER — ALENDRONATE SODIUM 70 MG PO TABS: ORAL_TABLET | ORAL | 1 refills | Status: DC

## 2018-10-14 MED ORDER — JANUMET XR 50-1000 MG PO TB24: 2.0000 | ORAL_TABLET | Freq: Every day | ORAL | 1 refills | Status: DC

## 2018-10-14 NOTE — Progress Notes (Signed)
Name: Lindsey [name]   MRN: [id number]    DOB: [birth year]   Date:10/14/2018       Progress Note  Subjective  Chief Complaint  Chief Complaint  Patient presents with  . Medication Refill    3 month F/U-has her daugher [name] with her today  . Diabetes    Does not check her sugar at home  . Hypertension    Denies any symptoms  . Osteopenia    Needs refill of Fosamax  . Hyperlipidemia  . Auditory Hallucination    Chipper Herb at [facility]  . Depression  . Dementia    HPI  Pt presents with her daughter who assists with the history.  DMII:HgbA1C has been out of control 8.1%-8.2% 8.2%, however at last check it was 6.9% and no medication changes were made. She is not checking her glucose at home but denies hypoglycemic episode.She still drinks pepsi's every day.  She endorses some mild to moderate polydipsia. She denies polyphagia or polyuria. Urine micro is due today.  Osteopenia: she was on Evista, but switched to Fosamax because of insurance changes. She is doing well, but reviewed how to take fosamax with her during the visit.She is due for repeat bone density test (and mammogram) - both are ordered, reminded to go. Refill provided today.  Denies reflux symptoms or jaw pain, no fractures since last visit.  She has been seeing Dr. Honor Junes with endocrinology for hypercalcemia - she stopped calcium supplement and this seemed to help.  Follow up is in January.  HTN: bptoday is towards low end of normal, denies dizziness, Was taking 12.5mg  in the morning - advised to take 6.25mg  BID, she is still taking losartan 25mg . and monitor Nochest pain, or increase in SOB.  Hyperlipidemia: taking Crestor and denies side effects of medication, no myalgia. Reviewed labs - needs repeat today as TGD's were remaining eleated at last check  Major Depression Mild/Anxiety: taking medication, gets anxious and irritable at times, sleeping well now. No crying spells.She states she  worries about her siblingsand her mother. Her mother moved to [place] to live with another daughter. She notes these stressors have improved as she is visiting her mom twice a month, and she sees her in [place] at least once a month for lunch/breakfast.   Morbid Obesity: She is not exercising, but is able to walk and get on a bike at her house.  She is eating a balanced diet.   Dementia:She states she continues to have auditoryhallucinations,but not as frequent. She istaking medications as prescribed. She still lives alone, daughter is managing her bills, doctors visits, and she also dispenses her medications, also has a alarm that dispenses her medication. She sees Dr. Donalynn Furlong with [facility] who manages her psychiatric and dementia medications - Dr. Donalynn Furlong adjusted seroquel dosing at night and this seems to have helped - no more episode of calling the police.   Patient Active Problem List   Diagnosis Date Noted  . 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 Uncontrolled type 2 diabetes mellitus with microalbuminuria, without long-term current use of insulin ([place]) 12/12/2015  . Mild dementia ([place]) 11/01/2015  . Auditory hallucination 07/26/2015  . Anemia of chronic disease 07/05/2015  . Poorly controlled type 2 diabetes mellitus with cataract ([place]) 04/06/2015  . Cataract 04/06/2015  . Benign essential HTN 04/02/2015  . Controlled gout 04/02/2015  . Dyslipidemia 04/02/2015  . Depression, major, recurrent, mild ([place]) 04/02/2015  . Fibromyalgia syndrome 04/02/2015  .  Chronic insomnia 04/02/2015  . Cognitive decline 04/02/2015  . Nodular basal cell carcinoma 04/02/2015  . Morbid obesity ([place]) 04/02/2015  . Osteopenia 04/02/2015  . Calcium blood increased 04/02/2015    Past Surgical History:  Procedure Laterality Date  . CESAREAN SECTION    . TONSILECTOMY, ADENOIDECTOMY, BILATERAL MYRINGOTOMY AND TUBES      Family History  Problem Relation Age of  Onset  . Hypertension Mother   . Osteoporosis Mother   . Cancer Mother        brain tumor  . Hodgkin's lymphoma Father   . Hypertension Sister   . Cancer Sister        unknown  . Hypertension Sister   . Diabetes Sister   . Cancer Sister 72       breast    Social History   Socioeconomic History  . Marital status: Divorced    Spouse name: Not on file  . Number of children: 3  . Years of education: Not on file  . Highest education level: GED or equivalent  Occupational History    Employer: RETIRED  Social Needs  . Financial resource strain: Not hard at all  . Food insecurity:    Worry: Never true    Inability: Never true  . Transportation needs:    Medical: No    Non-medical: No  Tobacco Use  . Smoking status: Never Smoker  . Smokeless tobacco: Never Used  . Tobacco comment: smoking cessation materials not required  Substance and Sexual Activity  . Alcohol use: No    Alcohol/week: 0.0 standard drinks  . Drug use: No  . Sexual activity: Not Currently  Lifestyle  . Physical activity:    Days per week: 0 days    Minutes per session: 0 min  . Stress: Not at all  Relationships  . Social connections:    Talks on phone: Patient refused    Gets together: Patient refused    Attends religious service: Patient refused    Active member of club or organization: Patient refused    Attends meetings of clubs or organizations: Patient refused    Relationship status: Divorced  . Intimate partner violence:    Fear of current or ex partner: No    Emotionally abused: No    Physically abused: No    Forced sexual activity: No  Other Topics Concern  . Not on file  Social History Narrative  . Not on file     Current Outpatient Medications:  .  alendronate (FOSAMAX) 70 MG tablet, TAKE 1 TABLET BY MOUTH ONCE A WEEK WITH  A  FULL  GLASS  OF  WATER  ON  AN  EMPTY  STOMACH, Disp: 12 tablet, Rfl: 1 .  allopurinol (ZYLOPRIM) 100 MG tablet, Take 1 tablet (100 mg total) by mouth 2  (two) times daily., Disp: 60 tablet, Rfl: 5 .  aspirin 81 MG chewable tablet, Chew 1 tablet by mouth daily., Disp: , Rfl:  .  Black Cohosh 540 MG CAPS, Take 1 tablet by mouth daily., Disp: , Rfl:  .  carvedilol (COREG) 6.25 MG tablet, Take 1 tablet (6.25 mg total) by mouth 2 (two) times daily with a meal. New dose, recheck bp one week after change in medication, Disp: 180 tablet, Rfl: 0 .  donepezil (ARICEPT) 10 MG tablet, Take 1 tablet (10 mg total) by mouth at bedtime., Disp: 90 tablet, Rfl: 1 .  empagliflozin (JARDIANCE) 25 MG TABS tablet, Take 25 mg by mouth daily., Disp:  90 tablet, Rfl: 1 .  glipiZIDE (GLUCOTROL XL) 2.5 MG 24 hr tablet, TAKE 1 TABLET BY MOUTH ONCE DAILY WITH  BREAKFAST, Disp: 90 tablet, Rfl: 0 .  GLUCOSE BLOOD VI, , Disp: , Rfl:  .  JANUMET XR 50-1000 MG TB24, Take 2 tablets by mouth daily., Disp: 180 tablet, Rfl: 1 .  losartan (COZAAR) 25 MG tablet, Take 1 tablet (25 mg total) by mouth daily., Disp: 90 tablet, Rfl: 1 .  Melatonin 3 MG TABS, Take 3 mg by mouth at bedtime. , Disp: , Rfl:  .  MULTIPLE VITAMIN PO, Take 1 tablet by mouth daily., Disp: , Rfl:  .  QUEtiapine (SEROQUEL) 100 MG tablet, Take 1.5 tablets (150 mg total) by mouth 3 (three) times daily. 50 in am, 50 around 4 pm and 1000mg  qhs (Patient taking differently: Take 250 mg by mouth 3 (three) times daily. 50 in am, 100 around 2 pm and 100 mg at night), Disp: , Rfl:  .  rosuvastatin (CRESTOR) 20 MG tablet, Take 1 tablet (20 mg total) by mouth daily., Disp: 90 tablet, Rfl: 1 .  VITAMIN D PO, Take by mouth., Disp: , Rfl:   Allergies  Allergen Reactions  . Ace Inhibitors     I personally reviewed active problem list, medication list, allergies, health maintenance, notes from last encounter, lab results with the patient/caregiver today.   ROS  Constitutional: Negative for fever or weight change.  Respiratory: Negative for cough and shortness of breath.   Cardiovascular: Negative for chest pain or  palpitations.  Gastrointestinal: Negative for abdominal pain, no bowel changes.  Musculoskeletal: Negative for gait problem or joint swelling.  Skin: Negative for rash.  Neurological: Negative for dizziness or headache.  No other specific complaints in a complete review of systems (except as listed in HPI above).  Objective  Vitals:   10/14/18 0930  BP: 112/68  Pulse: 100  Resp: 18  Temp: 97.6 F (36.4 C)  TempSrc: Oral  SpO2: 99%  Weight: 161 lb 6.4 oz (73.2 kg)  Height: 4\' 11"  (1.499 m)   Body mass index is 32.6 kg/m.  Physical Exam Constitutional: Patient appears well-developed and well-nourished. No distress.  HENT: Head: Normocephalic and atraumatic.  Nose: Nose normal. Mouth/Throat: Oropharynx is clear and moist. No oropharyngeal exudate or tonsillar swelling.  Eyes: Conjunctivae and EOM are normal. No scleral icterus.  Neck: Normal range of motion. Neck supple. No JVD present. No thyromegaly present.  Cardiovascular: Normal rate, regular rhythm and normal heart sounds.  No murmur heard. No BLE edema. Pulmonary/Chest: Effort normal and breath sounds normal. No respiratory distress. Musculoskeletal: Normal range of motion, no joint effusions. No gross deformities Neurological: Pt is alert and oriented to person, place, and time. No cranial nerve deficit. Coordination, balance, strength, speech and gait are normal.  Skin: Skin is warm and dry. No rash noted. No erythema.  Psychiatric: Patient has a normal mood and affect. behavior is normal. Judgment and thought content normal.   No results found for this or any previous visit (from the past 72 hour(s)).  PHQ2/9: Depression screen [facility] 2/9 10/14/2018 07/04/2018 03/26/2018 08/02/2017 04/01/2017  Decreased Interest 0 0 2 0 0  Down, Depressed, Hopeless 1 0 1 0 0  PHQ - 2 Score 1 0 3 0 0  Altered sleeping 1 0 3 - 0  Tired, decreased energy 1 0 1 - 2  Change in appetite 1 0 0 - 0  Feeling bad or failure about yourself  0 0  0  - 0  Trouble concentrating 0 0 0 - 0  Moving slowly or fidgety/restless 0 0 0 - 0  Suicidal thoughts - 0 0 - 0  PHQ-9 Score 4 0 7 - 2  Difficult doing work/chores Somewhat difficult Not difficult at all Not difficult at all - -   Fall Risk: Fall Risk  10/14/2018 07/04/2018 03/26/2018 08/02/2017 04/01/2017  Falls in the past year? 0 Yes Yes No Yes  Number falls in past yr: - 1 1 - 1  Comment - - - - -  Injury with Fall? - No No - No  Risk for fall due to : - Impaired vision;Impaired balance/gait;Medication side effect - - -  Risk for fall due to: Comment - wears eyeglasses; L knee pain - - -  Follow up - Falls evaluation completed;Education provided;Falls prevention discussed Falls prevention discussed - Falls prevention discussed   Functional Status Survey: Is the patient deaf or have difficulty hearing?: No Does the patient have difficulty seeing, even when wearing glasses/contacts?: Yes Does the patient have difficulty concentrating, remembering, or making decisions?: Yes Does the patient have difficulty walking or climbing stairs?: No Does the patient have difficulty dressing or bathing?: No Does the patient have difficulty doing errands alone such as visiting a doctor's office or shopping?: Yes   Assessment & Plan  1. Type 2 diabetes mellitus with microalbuminuria, without long-term current use of insulin (HCC) - Hemoglobin A1c - COMPLETE METABOLIC PANEL WITH GFR - JANUMET XR 50-1000 MG TB24; Take 2 tablets by mouth daily.  Dispense: 180 tablet; Refill: 1 - Urine Microalbumin w/creat. ratio  2. Osteopenia, unspecified location - alendronate (FOSAMAX) 70 MG tablet; TAKE 1 TABLET BY MOUTH ONCE A WEEK WITH  A  FULL  GLASS  OF  WATER  ON  AN  EMPTY  STOMACH  Dispense: 12 tablet; Refill: 1 - Continue to follow up with Endocrinology regardy calcium levels  3. Benign essential HTN - carvedilol (COREG) 6.25 MG tablet; Take 1 tablet (6.25 mg total) by mouth 2 (two) times daily with a  meal. New dose, recheck bp one week after change in medication  Dispense: 180 tablet; Refill: 0  4. Dyslipidemia - Lipid panel  5. Depression, major, recurrent, mild ([place]) - Keep follow up with Dr. Donalynn Furlong  6. Morbid obesity ([place]) - Discussed importance of 150 minutes of physical activity weekly, eat two servings of fish weekly, eat one serving of tree nuts ( cashews, pistachios, pecans, almonds.Marland Kitchen) every other day, eat 6 servings of fruit/vegetables daily and drink plenty of water and avoid sweet beverages.   7. Dementia due to medical condition without behavioral disturbance ([place]) - Keep follow up with Dr. Donalynn Furlong

## 2018-10-15 LAB — MICROALBUMIN / CREATININE URINE RATIO
CREATININE, URINE: 54 mg/dL (ref 20–275)
Microalb Creat Ratio: 24 mcg/mg creat (ref <30)
Microalb, Ur: 1.3 mg/dL

## 2018-10-15 LAB — LIPID PANEL
Cholesterol: 111 mg/dL (ref <200)
HDL: 45 mg/dL — ABNORMAL LOW (ref >50)
LDL Cholesterol (Calc): 32 mg/dL (calc)
Non-HDL Cholesterol (Calc): 66 mg/dL (calc) (ref <130)
Total CHOL/HDL Ratio: 2.5 (calc) (ref <5.0)
Triglycerides: 288 mg/dL — ABNORMAL HIGH (ref <150)

## 2018-10-15 LAB — COMPLETE METABOLIC PANEL WITH GFR
AG Ratio: 1.9 (calc) (ref 1.0–2.5)
ALT: 54 U/L — ABNORMAL HIGH (ref 6–29)
AST: 49 U/L — ABNORMAL HIGH (ref 10–35)
Albumin: 4.3 g/dL (ref 3.6–5.1)
Alkaline phosphatase (APISO): 79 U/L (ref 33–130)
BUN/Creatinine Ratio: 8 (calc) (ref 6–22)
BUN: 10 mg/dL (ref 7–25)
CO2: 31 mmol/L (ref 20–32)
Calcium: 10.3 mg/dL (ref 8.6–10.4)
Chloride: 103 mmol/L (ref 98–110)
Creat: 1.32 mg/dL — ABNORMAL HIGH (ref 0.50–0.99)
GFR, Est African American: 48 mL/min/{1.73_m2} — ABNORMAL LOW (ref >=60)
GFR, Est Non African American: 41 mL/min/{1.73_m2} — ABNORMAL LOW (ref >=60)
Globulin: 2.3 g/dL (calc) (ref 1.9–3.7)
Glucose, Bld: 180 mg/dL — ABNORMAL HIGH (ref 65–99)
Potassium: 4.5 mmol/L (ref 3.5–5.3)
Sodium: 140 mmol/L (ref 135–146)
Total Bilirubin: 0.4 mg/dL (ref 0.2–1.2)
Total Protein: 6.6 g/dL (ref 6.1–8.1)

## 2018-10-15 LAB — HEMOGLOBIN A1C
HEMOGLOBIN A1C: 6.7 %{Hb} — AB (ref <5.7)
Mean Plasma Glucose: 146 (calc)
eAG (mmol/L): 8.1 (calc)

## 2018-10-17 ENCOUNTER — Other Ambulatory Visit: Payer: Self-pay | Admitting: Family Medicine

## 2018-10-17 DIAGNOSIS — E785 Hyperlipidemia, unspecified: Secondary | ICD-10-CM

## 2018-10-17 MED ORDER — ICOSAPENT ETHYL 1 G PO CAPS: 2.0000 g | ORAL_CAPSULE | Freq: Two times a day (BID) | ORAL | 2 refills | Status: DC

## 2018-11-25 ENCOUNTER — Ambulatory Visit
Admission: RE | Admit: 2018-11-25 | Discharge: 2018-11-25 | Disposition: A | Discharge status: Home / Self Care | Payer: Medicare Other | Source: Ambulatory Visit | Attending: Family Medicine | Admitting: Family Medicine

## 2018-11-25 DIAGNOSIS — Z780 Asymptomatic menopausal state: Secondary | ICD-10-CM | POA: Diagnosis not present

## 2018-11-25 DIAGNOSIS — M8589 Other specified disorders of bone density and structure, multiple sites: Secondary | ICD-10-CM | POA: Diagnosis not present

## 2018-11-25 DIAGNOSIS — E2839 Other primary ovarian failure: Secondary | ICD-10-CM | POA: Diagnosis present

## 2018-11-25 DIAGNOSIS — Z1231 Encounter for screening mammogram for malignant neoplasm of breast: Secondary | ICD-10-CM | POA: Diagnosis not present

## 2018-11-25 DIAGNOSIS — Z7983 Long term (current) use of bisphosphonates: Secondary | ICD-10-CM | POA: Insufficient documentation

## 2018-11-25 DIAGNOSIS — Z1239 Encounter for other screening for malignant neoplasm of breast: Secondary | ICD-10-CM

## 2018-12-18 ENCOUNTER — Other Ambulatory Visit: Payer: Self-pay | Admitting: Family Medicine

## 2018-12-18 DIAGNOSIS — E1129 Type 2 diabetes mellitus with other diabetic kidney complication: Secondary | ICD-10-CM

## 2018-12-18 DIAGNOSIS — R809 Proteinuria, unspecified: Principal | ICD-10-CM

## 2018-12-18 NOTE — Telephone Encounter (Signed)
Refill request for diabetic medication:   Glipizide 2.5 mg 24 hr  Last office visit pertaining to diabetes: 10/14/2018  Lab Results  Component Value Date   HGBA1C 6.7 (H) 10/14/2018   Follow-ups on file. 01/13/2019

## 2019-01-13 ENCOUNTER — Ambulatory Visit: Payer: Medicare Other | Admitting: Family Medicine

## 2019-01-17 ENCOUNTER — Other Ambulatory Visit: Payer: Self-pay | Admitting: Family Medicine

## 2019-01-17 DIAGNOSIS — I10 Essential (primary) hypertension: Secondary | ICD-10-CM

## 2019-01-19 NOTE — Telephone Encounter (Signed)
This patient needs a follow up with Dr. Ancil Boozer in the next 1-2 weeks. Thanks.

## 2019-01-21 ENCOUNTER — Other Ambulatory Visit: Payer: Self-pay

## 2019-01-21 ENCOUNTER — Encounter: Payer: Self-pay | Admitting: Family Medicine

## 2019-01-21 ENCOUNTER — Ambulatory Visit (INDEPENDENT_AMBULATORY_CARE_PROVIDER_SITE_OTHER): Payer: Medicare Other | Admitting: Family Medicine

## 2019-01-21 DIAGNOSIS — M109 Gout, unspecified: Secondary | ICD-10-CM

## 2019-01-21 DIAGNOSIS — E1129 Type 2 diabetes mellitus with other diabetic kidney complication: Secondary | ICD-10-CM | POA: Diagnosis not present

## 2019-01-21 DIAGNOSIS — F330 Major depressive disorder, recurrent, mild: Secondary | ICD-10-CM

## 2019-01-21 DIAGNOSIS — I10 Essential (primary) hypertension: Secondary | ICD-10-CM | POA: Diagnosis not present

## 2019-01-21 DIAGNOSIS — E785 Hyperlipidemia, unspecified: Secondary | ICD-10-CM

## 2019-01-21 DIAGNOSIS — F0391 Unspecified dementia with behavioral disturbance: Secondary | ICD-10-CM

## 2019-01-21 DIAGNOSIS — M8580 Other specified disorders of bone density and structure, unspecified site: Secondary | ICD-10-CM

## 2019-01-21 DIAGNOSIS — R809 Proteinuria, unspecified: Secondary | ICD-10-CM

## 2019-01-21 MED ORDER — ALENDRONATE SODIUM 70 MG PO TABS: ORAL_TABLET | ORAL | 5 refills | Status: DC

## 2019-01-21 MED ORDER — EMPAGLIFLOZIN 25 MG PO TABS: 25.0000 mg | ORAL_TABLET | Freq: Every day | ORAL | 1 refills | Status: DC

## 2019-01-21 MED ORDER — GLIPIZIDE ER 2.5 MG PO TB24: ORAL_TABLET | ORAL | 1 refills | Status: DC

## 2019-01-21 MED ORDER — ICOSAPENT ETHYL 1 G PO CAPS: 2.0000 g | ORAL_CAPSULE | Freq: Two times a day (BID) | ORAL | 1 refills | Status: DC

## 2019-01-21 MED ORDER — ALLOPURINOL 100 MG PO TABS: 100.0000 mg | ORAL_TABLET | Freq: Two times a day (BID) | ORAL | 1 refills | Status: DC

## 2019-01-21 MED ORDER — LOSARTAN POTASSIUM 25 MG PO TABS: 25.0000 mg | ORAL_TABLET | Freq: Every day | ORAL | 1 refills | Status: DC

## 2019-01-21 MED ORDER — CARVEDILOL 6.25 MG PO TABS: ORAL_TABLET | ORAL | 1 refills | Status: DC

## 2019-01-21 MED ORDER — SITAGLIP PHOS-METFORMIN HCL ER 50-1000 MG PO TB24: 2.0000 | ORAL_TABLET | Freq: Every day | ORAL | 1 refills | Status: DC

## 2019-01-21 MED ORDER — ROSUVASTATIN CALCIUM 20 MG PO TABS: 20.0000 mg | ORAL_TABLET | Freq: Every day | ORAL | 1 refills | Status: DC

## 2019-01-21 NOTE — Progress Notes (Signed)
Name: Lindsey [name]   MRN: [id number]    DOB: June 13, 1951   Date:01/21/2019       Progress Note  Subjective  Chief Complaint  Chief Complaint  Patient presents with  . Medication Refill  . Diabetes    Patient is compliant. Last A1c 3 months ago @ 6.7  . Dyslipidemia    I connected with@ on 01/21/19 at  8:20 AM EDT by a video enabled telemedicine application and verified that I am speaking with the correct person using two identifiers.  I discussed the limitations of evaluation and management by telemedicine and the availability of in person appointments. The patient expressed understanding and agreed to proceed. Staff also discussed with the patient that there may be a patient responsible charge related to this service. Patient Location: at daughter's house Provider Location: [facility] Additional Individuals present: daughter   HPI  DMII:states they have a medication dispenser with an alarm and with COVID-19, spends one or two days a week at daughter's house. HgbA1C has been out of control 8.1%-8.2% 8.2% , 6.9%, last visit was 6.7% Cannot tolerate injections so we added  Glipizide in June 2019, she is not checking her glucose at home but denies hypoglycemic episodeShe still drinks Pepsi's every other day. She denies polyphagia, polydipsia or polyuria. She has obesity   Osteopenia: she was on Evista, but switched to Fosamax because of insurance changes. She is doing well, repeat bone density was done 11/2018 and stable   Auditory Hallucination: seen by Dr. Melrose Nakayama. Recentlyseen byDr.Koenig and she is taking Seroquel  100 TID She isalsoonAricept, the reason was increase in daytime episodes of hallucinations. She states hallucinations frightens hers. Patient states episodes of hallucinations are about 3-4 times a week.   HTN:  denies dizziness.  Nochest pain, or increase in SOB.Taking medications daily   Hyperlipidemia: taking Crestor and denies side  effects of medication, no myalgia. Unchanged   Major Depression Mild: taking medication, gets anxious, sleeping well now, irritability has improved. No crying spells.She states she worries about her siblingsand her mother. Her mother moved to [place] to live with another daughter.   Dementia: seen by Dr. Melrose Nakayama, she states she continues to have auditoryhallucinations,but not as frequent. She istaking medications as prescribed. She still lives alone, daughter is managing her bills, doctors visits, and she also dispenses her medications, also has a alarm that dispenses her medication. She is still able to cook, clean her house and shower and dress herself.   Patient Active Problem List   Diagnosis Date Noted  . 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 Uncontrolled type 2 diabetes mellitus with microalbuminuria, without long-term current use of insulin ([place]) 12/12/2015  . Mild dementia ([place]) 11/01/2015  . Auditory hallucination 07/26/2015  . Anemia of chronic disease 07/05/2015  . Poorly controlled type 2 diabetes mellitus with cataract ([place]) 04/06/2015  . Cataract 04/06/2015  . Benign essential HTN 04/02/2015  . Controlled gout 04/02/2015  . Dyslipidemia 04/02/2015  . Depression, major, recurrent, mild ([place]) 04/02/2015  . Fibromyalgia syndrome 04/02/2015  . Chronic insomnia 04/02/2015  . Cognitive decline 04/02/2015  . Nodular basal cell carcinoma 04/02/2015  . Morbid obesity ([place]) 04/02/2015  . Osteopenia 04/02/2015  . Calcium blood increased 04/02/2015    Past Surgical History:  Procedure Laterality Date  . CESAREAN SECTION    . TONSILECTOMY, ADENOIDECTOMY, BILATERAL MYRINGOTOMY AND TUBES      Family History  Problem Relation Age of Onset  .  Hypertension Mother   . Osteoporosis Mother   . Cancer Mother        brain tumor  . Hodgkin's lymphoma Father   . Hypertension Sister   . Cancer Sister        unknown  . Hypertension Sister   .  Diabetes Sister   . Cancer Sister 56       breast  . Breast cancer Sister 103    Social History   Socioeconomic History  . Marital status: Divorced    Spouse name: Not on file  . Number of children: 3  . Years of education: Not on file  . Highest education level: GED or equivalent  Occupational History    Employer: RETIRED  Social Needs  . Financial resource strain: Not hard at all  . Food insecurity:    Worry: Never true    Inability: Never true  . Transportation needs:    Medical: No    Non-medical: No  Tobacco Use  . Smoking status: Never Smoker  . Smokeless tobacco: Never Used  . Tobacco comment: smoking cessation materials not required  Substance and Sexual Activity  . Alcohol use: No    Alcohol/week: 0.0 standard drinks  . Drug use: No  . Sexual activity: Not Currently  Lifestyle  . Physical activity:    Days per week: 0 days    Minutes per session: 0 min  . Stress: Not at all  Relationships  . Social connections:    Talks on phone: Patient refused    Gets together: Patient refused    Attends religious service: Patient refused    Active member of club or organization: Patient refused    Attends meetings of clubs or organizations: Patient refused    Relationship status: Divorced  . Intimate partner violence:    Fear of current or ex partner: No    Emotionally abused: No    Physically abused: No    Forced sexual activity: No  Other Topics Concern  . Not on file  Social History Narrative  . Not on file     Current Outpatient Medications:  .  QUEtiapine (SEROQUEL) 100 MG tablet, Take 100 mg by mouth 3 (three) times daily., Disp: , Rfl:  .  alendronate (FOSAMAX) 70 MG tablet, TAKE 1 TABLET BY MOUTH ONCE A WEEK WITH  A  FULL  GLASS  OF  WATER  ON  AN  EMPTY  STOMACH, Disp: 12 tablet, Rfl: 1 .  allopurinol (ZYLOPRIM) 100 MG tablet, Take 1 tablet (100 mg total) by mouth 2 (two) times daily., Disp: 60 tablet, Rfl: 5 .  aspirin 81 MG chewable tablet, Chew 1  tablet by mouth daily., Disp: , Rfl:  .  Black Cohosh 540 MG CAPS, Take 1 tablet by mouth daily., Disp: , Rfl:  .  carvedilol (COREG) 6.25 MG tablet, TAKE 1 TABLET BY MOUTH TWICE DAILY WITH MEALS. NEW DOSE. RECHECK BP ONE WEEK AFTER CHANGE IN MEDICATION, Disp: 60 tablet, Rfl: 0 .  donepezil (ARICEPT) 10 MG tablet, Take 1 tablet (10 mg total) by mouth at bedtime., Disp: 90 tablet, Rfl: 1 .  empagliflozin (JARDIANCE) 25 MG TABS tablet, Take 25 mg by mouth daily., Disp: 90 tablet, Rfl: 1 .  glipiZIDE (GLUCOTROL XL) 2.5 MG 24 hr tablet, TAKE 1 TABLET BY MOUTH ONCE DAILY WITH BREAKFAST, Disp: 90 tablet, Rfl: 0 .  GLUCOSE BLOOD VI, , Disp: , Rfl:  .  Icosapent Ethyl 1 g CAPS, Take 2 capsules (2 g  total) by mouth 2 (two) times daily., Disp: 120 capsule, Rfl: 2 .  JANUMET XR 50-1000 MG TB24, Take 2 tablets by mouth daily., Disp: 180 tablet, Rfl: 1 .  losartan (COZAAR) 25 MG tablet, Take 1 tablet (25 mg total) by mouth daily., Disp: 90 tablet, Rfl: 1 .  Melatonin 3 MG TABS, Take 3 mg by mouth at bedtime. , Disp: , Rfl:  .  MULTIPLE VITAMIN PO, Take 1 tablet by mouth daily., Disp: , Rfl:  .  rosuvastatin (CRESTOR) 20 MG tablet, Take 1 tablet (20 mg total) by mouth daily., Disp: 90 tablet, Rfl: 1 .  VITAMIN D PO, Take by mouth., Disp: , Rfl:   Allergies  Allergen Reactions  . Ace Inhibitors     I personally reviewed active problem list, medication list, allergies, family history, social history with the patient/caregiver today.   ROS  Constitutional: Negative for fever or weight change.  Respiratory: Negative for cough and shortness of breath.   Cardiovascular: Negative for chest pain or palpitations.  Gastrointestinal: Negative for abdominal pain, no bowel changes.  Musculoskeletal: Negative for gait problem or joint swelling.  Skin: Negative for rash.  Neurological: Negative for dizziness or headache.  No other specific complaints in a complete review of systems (except as listed in HPI  above).  Objective  Virtual encounter, vitals not obtained.  There is no height or weight on file to calculate BMI.  Physical Exam  Awake, alert, wearing her night gown  PHQ2/9: Depression screen [facility] 2/9 01/21/2019 10/14/2018 07/04/2018 03/26/2018 08/02/2017  Decreased Interest 0 0 0 2 0  Down, Depressed, Hopeless 0 1 0 1 0  PHQ - 2 Score 0 1 0 3 0  Altered sleeping 0 1 0 3 -  Tired, decreased energy 0 1 0 1 -  Change in appetite 0 1 0 0 -  Feeling bad or failure about yourself  0 0 0 0 -  Trouble concentrating 0 0 0 0 -  Moving slowly or fidgety/restless 0 0 0 0 -  Suicidal thoughts 0 - 0 0 -  PHQ-9 Score 0 4 0 7 -  Difficult doing work/chores - Somewhat difficult Not difficult at all Not difficult at all -   PHQ-2/9 Result is negative.    Fall Risk: Fall Risk  01/21/2019 10/14/2018 07/04/2018 03/26/2018 08/02/2017  Falls in the past year? 0 0 Yes Yes No  Number falls in past yr: 0 - 1 1 -  Comment - - - - -  Injury with Fall? 0 - No No -  Risk for fall due to : - - Impaired vision;Impaired balance/gait;Medication side effect - -  Risk for fall due to: Comment - - wears eyeglasses; L knee pain - -  Follow up - - Falls evaluation completed;Education provided;Falls prevention discussed Falls prevention discussed -     Assessment & Plan  1. Type 2 diabetes mellitus with microalbuminuria, without long-term current use of insulin (HCC)  - losartan (COZAAR) 25 MG tablet; Take 1 tablet (25 mg total) by mouth daily.  Dispense: 90 tablet; Refill: 1 - JANUMET XR 50-1000 MG TB24; Take 2 tablets by mouth daily.  Dispense: 180 tablet; Refill: 1 - glipiZIDE (GLUCOTROL XL) 2.5 MG 24 hr tablet  Dispense: 90 tablet; Refill: 1 - empagliflozin (JARDIANCE) 25 MG TABS tablet; Take 25 mg by mouth daily.  Dispense: 90 tablet; Refill: 1  2. Dyslipidemia  - rosuvastatin (CRESTOR) 20 MG tablet; Take 1 tablet (20 mg total) by mouth daily.  Dispense: 90 tablet; Refill: 1 - Icosapent Ethyl 1 g CAPS;  Take 2 capsules (2 g total) by mouth 2 (two) times daily.  Dispense: 360 capsule; Refill: 1  3. Dementia with behavioral disturbance, unspecified dementia type (HCC)  - donepezil (ARICEPT) 10 MG tablet; Take 1 tablet (10 mg total) by mouth at bedtime.  Dispense: 90 tablet; Refill: 1  4. Benign essential HTN  - carvedilol (COREG) 6.25 MG tablet; TAKE 1 TABLET BY MOUTH TWICE DAILY WITH MEALS. NEW DOSE. RECHECK BP ONE WEEK AFTER CHANGE IN MEDICATION  Dispense: 180 tablet; Refill: 1  5. Controlled gout  - allopurinol (ZYLOPRIM) 100 MG tablet; Take 1 tablet (100 mg total) by mouth 2 (two) times daily.  Dispense: 180 tablet; Refill: 1  6. Osteopenia, unspecified location  - alendronate (FOSAMAX) 70 MG tablet; TAKE 1 TABLET BY MOUTH ONCE A WEEK WITH  A  FULL  GLASS  OF  WATER  ON  AN  EMPTY  STOMACH  Dispense: 12 tablet; Refill: 5   I discussed the assessment and treatment plan with the patient. The patient was provided an opportunity to ask questions and all were answered. The patient agreed with the plan and demonstrated an understanding of the instructions.  The patient was advised to call back or seek an in-person evaluation if the symptoms worsen or if the condition fails to improve as anticipated.  I provided 25  minutes of non-face-to-face time during this encounter.

## 2019-04-22 ENCOUNTER — Encounter: Payer: Self-pay | Admitting: Family Medicine

## 2019-04-22 ENCOUNTER — Ambulatory Visit (INDEPENDENT_AMBULATORY_CARE_PROVIDER_SITE_OTHER): Payer: Medicare Other | Admitting: Family Medicine

## 2019-04-22 ENCOUNTER — Other Ambulatory Visit: Payer: Self-pay

## 2019-04-22 VITALS — BP 124/72 | HR 106

## 2019-04-22 DIAGNOSIS — I10 Essential (primary) hypertension: Secondary | ICD-10-CM | POA: Diagnosis not present

## 2019-04-22 DIAGNOSIS — F0391 Unspecified dementia with behavioral disturbance: Secondary | ICD-10-CM

## 2019-04-22 DIAGNOSIS — E1129 Type 2 diabetes mellitus with other diabetic kidney complication: Secondary | ICD-10-CM

## 2019-04-22 DIAGNOSIS — R440 Auditory hallucinations: Secondary | ICD-10-CM

## 2019-04-22 DIAGNOSIS — R809 Proteinuria, unspecified: Secondary | ICD-10-CM

## 2019-04-22 DIAGNOSIS — L602 Onychogryphosis: Secondary | ICD-10-CM | POA: Diagnosis not present

## 2019-04-22 DIAGNOSIS — E785 Hyperlipidemia, unspecified: Secondary | ICD-10-CM

## 2019-04-22 DIAGNOSIS — M109 Gout, unspecified: Secondary | ICD-10-CM

## 2019-04-22 LAB — POCT GLYCOSYLATED HEMOGLOBIN (HGB A1C): HbA1c, POC (controlled diabetic range): 7.4 % — AB (ref 0.0–7.0)

## 2019-04-22 NOTE — Progress Notes (Signed)
Name: Lindsey [name]   MRN: [id number]    DOB: 08-Jul-1951   Date:04/22/2019       Progress Note  Subjective  Chief Complaint  Chief Complaint  Patient presents with  . Medication Refill    3 month F/U-Daughter would like to ask if Prevagen would help her mother with memory?   . Diabetes    Daughter would like a referral to Podiatrist for her toe nails to be cut  . Hypertension    Denies any symptoms  . Hyperlipidemia  . Depression  . Dementia  . Shortness of Breath    Onset-past couple of months her SOB has gotten worst     I connected with  [name]  on 04/22/19 at 10:40 AM EDT by a video enabled telemedicine application and verified that I am speaking with the correct person using two identifiers.  I discussed the limitations of evaluation and management by telemedicine and the availability of in person appointments. The patient expressed understanding and agreed to proceed. Staff also discussed with the patient that there may be a patient responsible charge related to this service. Patient Location: at daughter's house Provider Location: [facility]  Additional Individuals present: daughter [name]  HPI  DMII: HgbA1Chas been out of control 8.1%-8.2%8.2% , 6.9%, 6.7% and today 7.4%  Cannot tolerate injectionsso we added  Glipizide in June 2019, she is not checking her glucose at home but denies hypoglycemic episodeShe still drinks Pepsi's , usually one a day, drinking more water  She denies polyphagia, polydipsia or polyuria.She has obesity   Osteopenia: she was on Evista, but switched to Fosamax because of insurance changes. She is doing well, repeat bone density was done 11/2018 and stable.   Auditory Hallucination: seen by Dr. Melrose Nakayama.Recentlyseen byDr.Koenig and she is taking Seroquel 100 TIDShe isalsoonAricept, the reason was increase in daytime episodes of hallucinations.She states hallucinations frightens hers.Patient states  episodes of hallucinations are less than 3 times a week now.   HTN: denies dizziness. Nochest pain, or increase in SOB.Taking medications daily  Unable to check bp at home but we will check when she comes in for A1C this afternoon   Hyperlipidemia: taking Crestor and denies side effects of medication, including no muscle aches   Major Depression in remission:  taking medication, gets anxious occasionally, but doing better, phq 9 is normal today   Dementia: seen by Dr. Melrose Nakayama, she states she continues to have auditoryhallucinations,but not as frequent. She istaking medications as prescribed. She still lives alone, daughter is managing her bills, doctors visits, and she also dispenses her medications, also has a alarm that dispenses her medication.She is still able to cook, clean her house and shower and dress herself. Unchanged   Patient Active Problem List   Diagnosis Date Noted  . 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 Uncontrolled type 2 diabetes mellitus with microalbuminuria, without long-term current use of insulin ([place]) 12/12/2015  . Mild dementia ([place]) 11/01/2015  . Auditory hallucination 07/26/2015  . Anemia of chronic disease 07/05/2015  . Poorly controlled type 2 diabetes mellitus with cataract ([place]) 04/06/2015  . Cataract 04/06/2015  . Benign essential HTN 04/02/2015  . Controlled gout 04/02/2015  . Dyslipidemia 04/02/2015  . Depression, major, recurrent, mild ([place]) 04/02/2015  . Fibromyalgia syndrome 04/02/2015  . Chronic insomnia 04/02/2015  . Cognitive decline 04/02/2015  . Nodular basal cell carcinoma 04/02/2015  . Morbid obesity ([place]) 04/02/2015  . Osteopenia 04/02/2015  . Calcium  blood increased 04/02/2015    Past Surgical History:  Procedure Laterality Date  . CESAREAN SECTION    . TONSILECTOMY, ADENOIDECTOMY, BILATERAL MYRINGOTOMY AND TUBES      Family History  Problem Relation Age of Onset  . Hypertension Mother    . Osteoporosis Mother   . Cancer Mother        brain tumor  . Hodgkin's lymphoma Father   . Hypertension Sister   . Cancer Sister        unknown  . Hypertension Sister   . Diabetes Sister   . Cancer Sister 62       breast  . Breast cancer Sister 11    Social History   Socioeconomic History  . Marital status: Divorced    Spouse name: Not on file  . Number of children: 3  . Years of education: Not on file  . Highest education level: GED or equivalent  Occupational History    Employer: RETIRED  Social Needs  . Financial resource strain: Not hard at all  . Food insecurity    Worry: Never true    Inability: Never true  . Transportation needs    Medical: No    Non-medical: No  Tobacco Use  . Smoking status: Never Smoker  . Smokeless tobacco: Never Used  . Tobacco comment: smoking cessation materials not required  Substance and Sexual Activity  . Alcohol use: No    Alcohol/week: 0.0 standard drinks  . Drug use: No  . Sexual activity: Not Currently  Lifestyle  . Physical activity    Days per week: 0 days    Minutes per session: 0 min  . Stress: Not at all  Relationships  . Social Herbalist on phone: Patient refused    Gets together: Patient refused    Attends religious service: Patient refused    Active member of club or organization: Patient refused    Attends meetings of clubs or organizations: Patient refused    Relationship status: Divorced  . Intimate partner violence    Fear of current or ex partner: No    Emotionally abused: No    Physically abused: No    Forced sexual activity: No  Other Topics Concern  . Not on file  Social History Narrative  . Not on file     Current Outpatient Medications:  .  alendronate (FOSAMAX) 70 MG tablet, TAKE 1 TABLET BY MOUTH ONCE A WEEK WITH  A  FULL  GLASS  OF  WATER  ON  AN  EMPTY  STOMACH, Disp: 12 tablet, Rfl: 5 .  allopurinol (ZYLOPRIM) 100 MG tablet, Take 1 tablet (100 mg total) by mouth 2 (two)  times daily., Disp: 180 tablet, Rfl: 1 .  aspirin 81 MG chewable tablet, Chew 1 tablet by mouth daily., Disp: , Rfl:  .  Black Cohosh 540 MG CAPS, Take 1 tablet by mouth daily., Disp: , Rfl:  .  carvedilol (COREG) 6.25 MG tablet, TAKE 1 TABLET BY MOUTH TWICE DAILY WITH MEALS. NEW DOSE. RECHECK BP ONE WEEK AFTER CHANGE IN MEDICATION, Disp: 180 tablet, Rfl: 1 .  donepezil (ARICEPT) 10 MG tablet, Take 1 tablet (10 mg total) by mouth at bedtime. (Patient taking differently: Take 10 mg by mouth daily. In the morning), Disp: 90 tablet, Rfl: 1 .  empagliflozin (JARDIANCE) 25 MG TABS tablet, Take 25 mg by mouth daily., Disp: 90 tablet, Rfl: 1 .  glipiZIDE (GLUCOTROL XL) 2.5 MG 24 hr tablet, One daily  in the am, Disp: 90 tablet, Rfl: 1 .  GLUCOSE BLOOD VI, , Disp: , Rfl:  .  Icosapent Ethyl 1 g CAPS, Take 2 capsules (2 g total) by mouth 2 (two) times daily. (Patient taking differently: Take 1 g by mouth 2 (two) times daily. ), Disp: 360 capsule, Rfl: 1 .  losartan (COZAAR) 25 MG tablet, Take 1 tablet (25 mg total) by mouth daily., Disp: 90 tablet, Rfl: 1 .  Melatonin 3 MG TABS, Take 3 mg by mouth at bedtime. , Disp: , Rfl:  .  MULTIPLE VITAMIN PO, Take 1 tablet by mouth daily., Disp: , Rfl:  .  QUEtiapine (SEROQUEL) 100 MG tablet, Take 100 mg by mouth 3 (three) times daily., Disp: , Rfl:  .  rosuvastatin (CRESTOR) 20 MG tablet, Take 1 tablet (20 mg total) by mouth daily., Disp: 90 tablet, Rfl: 1 .  SitaGLIPtin-MetFORMIN HCl (JANUMET XR) 50-1000 MG TB24, Take 2 tablets by mouth daily., Disp: 180 tablet, Rfl: 1 .  VITAMIN D PO, Take by mouth., Disp: , Rfl:  .  Black Cohosh 20 MG TABS, Take by mouth., Disp: , Rfl:   Allergies  Allergen Reactions  . Ace Inhibitors     I personally reviewed active problem list, medication list, allergies, family history, social history with the patient/caregiver today.   ROS  Ten systems reviewed and is negative except as mentioned in HPI   Objective  Today's  Vitals   04/22/19 1603  BP: 124/72  Pulse: (!) 106  SpO2: 97%   There is no height or weight on file to calculate BMI.  Physical Exam  Awake, alert and oriented  Thick and long toenails, right first toe nail is the worse  PHQ2/9: Depression screen [facility] 2/9 04/22/2019 01/21/2019 10/14/2018 07/04/2018 03/26/2018  Decreased Interest 0 0 0 0 2  Down, Depressed, Hopeless 0 0 1 0 1  PHQ - 2 Score 0 0 1 0 3  Altered sleeping 0 0 1 0 3  Tired, decreased energy 0 0 1 0 1  Change in appetite 0 0 1 0 0  Feeling bad or failure about yourself  0 0 0 0 0  Trouble concentrating 0 0 0 0 0  Moving slowly or fidgety/restless 0 0 0 0 0  Suicidal thoughts 0 0 - 0 0  PHQ-9 Score 0 0 4 0 7  Difficult doing work/chores Not difficult at all - Somewhat difficult Not difficult at all Not difficult at all   PHQ-2/9 Result is negative.    Fall Risk: Fall Risk  04/22/2019 01/21/2019 10/14/2018 07/04/2018 03/26/2018  Falls in the past year? 0 0 0 Yes Yes  Number falls in past yr: 0 0 - 1 1  Comment - - - - -  Injury with Fall? 0 0 - No No  Risk for fall due to : - - - Impaired vision;Impaired balance/gait;Medication side effect -  Risk for fall due to: Comment - - - wears eyeglasses; L knee pain -  Follow up - - - Falls evaluation completed;Education provided;Falls prevention discussed Falls prevention discussed     Assessment & Plan   1. Type 2 diabetes mellitus with microalbuminuria, without long-term current use of insulin (HCC)  - Ambulatory referral to Podiatry - POCT glycosylated hemoglobin (Hb A1C) -has gone up, needs to resume a healthy diet  2. Hypertrophic toenail  - Ambulatory referral to Podiatry  3. Benign essential HTN  Hopefully we can get her bp later today when she returns  for A1C  4. Morbid obesity ([place])  Discussed with the patient the risk posed by an increased BMI. Discussed importance of portion control, calorie counting and at least 150 minutes of physical activity weekly.  Avoid sweet beverages and drink more water. Eat at least 6 servings of fruit and vegetables daily   5. Auditory hallucination  Less frequent   6. Dementia with behavioral disturbance, unspecified dementia type ([place])  Doing better, very seldom gets agitated now   7. Dyslipidemia  On statin therapy   8. Controlled gout  No recent episodes  I discussed the assessment and treatment plan with the patient. The patient was provided an opportunity to ask questions and all were answered. The patient agreed with the plan and demonstrated an understanding of the instructions.  The patient was advised to call back or seek an in-person evaluation if the symptoms worsen or if the condition fails to improve as anticipated.  I provided 25 minutes of non-face-to-face time during this encounter.

## 2019-05-04 ENCOUNTER — Ambulatory Visit (INDEPENDENT_AMBULATORY_CARE_PROVIDER_SITE_OTHER): Payer: Medicare Other | Admitting: Podiatry

## 2019-05-04 ENCOUNTER — Encounter: Payer: Self-pay | Admitting: Podiatry

## 2019-05-04 ENCOUNTER — Other Ambulatory Visit: Payer: Self-pay

## 2019-05-04 DIAGNOSIS — E1165 Type 2 diabetes mellitus with hyperglycemia: Secondary | ICD-10-CM | POA: Diagnosis not present

## 2019-05-04 DIAGNOSIS — E1136 Type 2 diabetes mellitus with diabetic cataract: Secondary | ICD-10-CM

## 2019-05-04 DIAGNOSIS — M79674 Pain in right toe(s): Secondary | ICD-10-CM

## 2019-05-04 DIAGNOSIS — B351 Tinea unguium: Secondary | ICD-10-CM | POA: Diagnosis not present

## 2019-05-04 DIAGNOSIS — M79675 Pain in left toe(s): Secondary | ICD-10-CM

## 2019-05-04 NOTE — Progress Notes (Signed)

## 2019-07-09 ENCOUNTER — Ambulatory Visit (INDEPENDENT_AMBULATORY_CARE_PROVIDER_SITE_OTHER): Payer: Medicare Other

## 2019-07-09 ENCOUNTER — Other Ambulatory Visit: Payer: Self-pay

## 2019-07-09 VITALS — BP 138/88 | HR 98 | Temp 96.8°F | Resp 16 | Ht 59.0 in | Wt 159.1 lb

## 2019-07-09 DIAGNOSIS — Z23 Encounter for immunization: Secondary | ICD-10-CM | POA: Diagnosis not present

## 2019-07-09 DIAGNOSIS — Z0000 Encounter for general adult medical examination without abnormal findings: Secondary | ICD-10-CM

## 2019-07-09 NOTE — Patient Instructions (Addendum)
Lindsey [name] , Thank you for taking time to come for your Medicare Wellness Visit. I appreciate your ongoing commitment to your health goals. Please review the following plan we discussed and let me know if I can assist you in the future.   Screening recommendations/referrals: Colonoscopy: done 04/24/13 Mammogram: done 11/25/18 Bone Density: done 11/25/18 Recommended yearly ophthalmology/optometry visit for glaucoma screening and checkup Recommended yearly dental visit for hygiene and checkup  Vaccinations: Influenza vaccine: done today Pneumococcal vaccine: done 04/06/16 Tdap vaccine: done 11/19/14 Shingles vaccine: Shingrix discussed. Please contact your pharmacy for coverage information.   Advanced directives: Please bring a copy of your health care power of attorney and living will to the office at your convenience if you have these documents.   Conditions/risks identified: Keep up the great work!  Next appointment: Please follow up in one year for your Medicare Annual Wellness visit.     Preventive Care 20 Years and Older, Female Preventive care refers to lifestyle choices and visits with your health care provider that can promote health and wellness. What does preventive care include?  A yearly physical exam. This is also called an annual well check.  Dental exams once or twice a year.  Routine eye exams. Ask your health care provider how often you should have your eyes checked.  Personal lifestyle choices, including:  Daily care of your teeth and gums.  Regular physical activity.  Eating a healthy diet.  Avoiding tobacco and drug use.  Limiting alcohol use.  Practicing safe sex.  Taking low-dose aspirin every day.  Taking vitamin and mineral supplements as recommended by your health care provider. What happens during an annual well check? The services and screenings done by your health care provider during your annual well check will depend on your age, overall  health, lifestyle risk factors, and family history of disease. Counseling  Your health care provider may ask you questions about your:  Alcohol use.  Tobacco use.  Drug use.  Emotional well-being.  Home and relationship well-being.  Sexual activity.  Eating habits.  History of falls.  Memory and ability to understand (cognition).  Work and work Statistician.  Reproductive health. Screening  You may have the following tests or measurements:  Height, weight, and BMI.  Blood pressure.  Lipid and cholesterol levels. These may be checked every 5 years, or more frequently if you are over 66 years old.  Skin check.  Lung cancer screening. You may have this screening every year starting at age 8 if you have a 30-pack-year history of smoking and currently smoke or have quit within the past 15 years.  Fecal occult blood test (FOBT) of the stool. You may have this test every year starting at age 63.  Flexible sigmoidoscopy or colonoscopy. You may have a sigmoidoscopy every 5 years or a colonoscopy every 10 years starting at age 42.  Hepatitis C blood test.  Hepatitis B blood test.  Sexually transmitted disease (STD) testing.  Diabetes screening. This is done by checking your blood sugar (glucose) after you have not eaten for a while (fasting). You may have this done every 1-3 years.  Bone density scan. This is done to screen for osteoporosis. You may have this done starting at age 59.  Mammogram. This may be done every 1-2 years. Talk to your health care provider about how often you should have regular mammograms. Talk with your health care provider about your test results, treatment options, and if necessary, the need for more tests.  Vaccines  Your health care provider may recommend certain vaccines, such as:  Influenza vaccine. This is recommended every year.  Tetanus, diphtheria, and acellular pertussis (Tdap, Td) vaccine. You may need a Td booster every 10 years.   Zoster vaccine. You may need this after age 60.  Pneumococcal 13-valent conjugate (PCV13) vaccine. One dose is recommended after age 83.  Pneumococcal polysaccharide (PPSV23) vaccine. One dose is recommended after age 37. Talk to your health care provider about which screenings and vaccines you need and how often you need them. This information is not intended to replace advice given to you by your health care provider. Make sure you discuss any questions you have with your health care provider. Document Released: 10/28/2015 Document Revised: 06/20/2016 Document Reviewed: 08/02/2015 Elsevier Interactive Patient Education  2017 [place] Prevention in the Home Falls can cause injuries. They can happen to people of all ages. There are many things you can do to make your home safe and to help prevent falls. What can I do on the outside of my home?  Regularly fix the edges of walkways and driveways and fix any cracks.  Remove anything that might make you trip as you walk through a door, such as a raised step or threshold.  Trim any bushes or trees on the path to your home.  Use bright outdoor lighting.  Clear any walking paths of anything that might make someone trip, such as rocks or tools.  Regularly check to see if handrails are loose or broken. Make sure that both sides of any steps have handrails.  Any raised decks and porches should have guardrails on the edges.  Have any leaves, snow, or ice cleared regularly.  Use sand or salt on walking paths during winter.  Clean up any spills in your garage right away. This includes oil or grease spills. What can I do in the bathroom?  Use night lights.  Install grab bars by the toilet and in the tub and shower. Do not use towel bars as grab bars.  Use non-skid mats or decals in the tub or shower.  If you need to sit down in the shower, use a plastic, non-slip stool.  Keep the floor dry. Clean up any water that spills  on the floor as soon as it happens.  Remove soap buildup in the tub or shower regularly.  Attach bath mats securely with double-sided non-slip rug tape.  Do not have throw rugs and other things on the floor that can make you trip. What can I do in the bedroom?  Use night lights.  Make sure that you have a light by your bed that is easy to reach.  Do not use any sheets or blankets that are too big for your bed. They should not hang down onto the floor.  Have a firm chair that has side arms. You can use this for support while you get dressed.  Do not have throw rugs and other things on the floor that can make you trip. What can I do in the kitchen?  Clean up any spills right away.  Avoid walking on wet floors.  Keep items that you use a lot in easy-to-reach places.  If you need to reach something above you, use a strong step stool that has a grab bar.  Keep electrical cords out of the way.  Do not use floor polish or wax that makes floors slippery. If you must use wax, use non-skid floor wax.  Do not have throw rugs and other things on the floor that can make you trip. What can I do with my stairs?  Do not leave any items on the stairs.  Make sure that there are handrails on both sides of the stairs and use them. Fix handrails that are broken or loose. Make sure that handrails are as long as the stairways.  Check any carpeting to make sure that it is firmly attached to the stairs. Fix any carpet that is loose or worn.  Avoid having throw rugs at the top or bottom of the stairs. If you do have throw rugs, attach them to the floor with carpet tape.  Make sure that you have a light switch at the top of the stairs and the bottom of the stairs. If you do not have them, ask someone to add them for you. What else can I do to help prevent falls?  Wear shoes that:  Do not have high heels.  Have rubber bottoms.  Are comfortable and fit you well.  Are closed at the toe. Do  not wear sandals.  If you use a stepladder:  Make sure that it is fully opened. Do not climb a closed stepladder.  Make sure that both sides of the stepladder are locked into place.  Ask someone to hold it for you, if possible.  Clearly mark and make sure that you can see:  Any grab bars or handrails.  First and last steps.  Where the edge of each step is.  Use tools that help you move around (mobility aids) if they are needed. These include:  Canes.  Walkers.  Scooters.  Crutches.  Turn on the lights when you go into a dark area. Replace any light bulbs as soon as they burn out.  Set up your furniture so you have a clear path. Avoid moving your furniture around.  If any of your floors are uneven, fix them.  If there are any pets around you, be aware of where they are.  Review your medicines with your doctor. Some medicines can make you feel dizzy. This can increase your chance of falling. Ask your doctor what other things that you can do to help prevent falls. This information is not intended to replace advice given to you by your health care provider. Make sure you discuss any questions you have with your health care provider. Document Released: 07/28/2009 Document Revised: 03/08/2016 Document Reviewed: 11/05/2014 Elsevier Interactive Patient Education  2017 [place].  Influenza (Flu) Vaccine (Inactivated or Recombinant): What You Need to Know 1. Why get vaccinated? Influenza vaccine can prevent influenza (flu). Flu is a contagious disease that spreads around the [place] every year, usually between October and May. Anyone can get the flu, but it is more dangerous for some people. Infants and young children, people 58 years of age and older, pregnant women, and people with certain health conditions or a weakened immune system are at greatest risk of flu complications. Pneumonia, bronchitis, sinus infections and ear infections are examples of flu-related  complications. If you have a medical condition, such as heart disease, cancer or diabetes, flu can make it worse. Flu can cause fever and chills, sore throat, muscle aches, fatigue, cough, headache, and runny or stuffy nose. Some people may have vomiting and diarrhea, though this is more common in children than adults. Each year thousands of people in the [place] die from flu, and many more are hospitalized. Flu vaccine prevents millions of  illnesses and flu-related visits to the doctor each year. 2. Influenza vaccine CDC recommends everyone 63 months of age and older get vaccinated every flu season. Children 6 months through 54 years of age may need 2 doses during a single flu season. Everyone else needs only 1 dose each flu season. It takes about 2 weeks for protection to develop after vaccination. There are many flu viruses, and they are always changing. Each year a new flu vaccine is made to protect against three or four viruses that are likely to cause disease in the upcoming flu season. Even when the vaccine doesn't exactly match these viruses, it may still provide some protection. Influenza vaccine does not cause flu. Influenza vaccine may be given at the same time as other vaccines. 3. Talk with your health care provider Tell your vaccine provider if the person getting the vaccine:  Has had an allergic reaction after a previous dose of influenza vaccine, or has any severe, life-threatening allergies.  Has ever had Guillain-Barr Syndrome (also called GBS). In some cases, your health care provider may decide to postpone influenza vaccination to a future visit. People with minor illnesses, such as a cold, may be vaccinated. People who are moderately or severely ill should usually wait until they recover before getting influenza vaccine. Your health care provider can give you more information. 4. Risks of a vaccine reaction  Soreness, redness, and swelling where shot is given, fever,  muscle aches, and headache can happen after influenza vaccine.  There may be a very small increased risk of Guillain-Barr Syndrome (GBS) after inactivated influenza vaccine (the flu shot). Young children who get the flu shot along with pneumococcal vaccine (PCV13), and/or DTaP vaccine at the same time might be slightly more likely to have a seizure caused by fever. Tell your health care provider if a child who is getting flu vaccine has ever had a seizure. People sometimes faint after medical procedures, including vaccination. Tell your provider if you feel dizzy or have vision changes or ringing in the ears. As with any medicine, there is a very remote chance of a vaccine causing a severe allergic reaction, other serious injury, or death. 5. What if there is a serious problem? An allergic reaction could occur after the vaccinated person leaves the clinic. If you see signs of a severe allergic reaction (hives, swelling of the face and throat, difficulty breathing, a fast heartbeat, dizziness, or weakness), call 9-1-1 and get the person to the nearest hospital. For other signs that concern you, call your health care provider. Adverse reactions should be reported to the Vaccine Adverse Event Reporting System (VAERS). Your health care provider will usually file this report, or you can do it yourself. Visit the VAERS website at www.vaers.SamedayNews.es or call 619-168-8959.VAERS is only for reporting reactions, and VAERS staff do not give medical advice. 6. The National Vaccine Injury Compensation Program The [employer] Vaccine Injury Compensation Program (VICP) is a federal program that was created to compensate people who may have been injured by certain vaccines. Visit the VICP website at GoldCloset.com.ee or call 234-872-4084 to learn about the program and about filing a claim. There is a time limit to file a claim for compensation. 7. How can I learn more?  Ask your healthcare provider.   Call your local or state health department.  Contact the Centers for Disease Control and Prevention (CDC): ? Call (970)436-9172 (1-800-CDC-INFO) or ? Visit CDC's https://gibson.com/ Vaccine Information Statement (Interim) Inactivated Influenza Vaccine (05/29/2018) This information is  not intended to replace advice given to you by your health care provider. Make sure you discuss any questions you have with your health care provider. Document Released: 07/26/2006 Document Revised: 01/20/2019 Document Reviewed: 06/02/2018 Elsevier Patient Education  2020 [employer].

## 2019-07-09 NOTE — Progress Notes (Signed)
Subjective:   Lindsey [name] is a 68 y.o. female who presents for Medicare Annual (Subsequent) preventive examination.  Review of Systems:   Cardiac Risk Factors include: advanced age (>25men, >22 women);diabetes mellitus;dyslipidemia;hypertension;obesity (BMI >30kg/m2)     Objective:     Vitals: BP 138/88 (BP Location: Left Arm, Patient Position: Sitting, Cuff Size: Normal)   Pulse 98   Temp (!) 96.8 F (36 C) (Temporal)   Resp 16   Ht 4\' 11"  (1.499 m)   Wt 159 lb 1.6 oz (72.2 kg)   SpO2 97%   BMI 32.13 kg/m   Body mass index is 32.13 kg/m.  Advanced Directives 07/09/2019 07/04/2018 08/02/2017 04/01/2017 01/20/2017 11/13/2016 08/06/2016  Does Patient Have a Medical Advance Directive? No No No No No No No  Type of Advance Directive - - - - - - -  Does patient want to make changes to medical advance directive? - - - - - - -  Would patient like information on creating a medical advance directive? No - Patient declined Yes (MAU/Ambulatory/Procedural Areas - Information given) - No - Patient declined No - Patient declined - No - patient declined information    Tobacco Social History   Tobacco Use  Smoking Status Never Smoker  Smokeless Tobacco Never Used  Tobacco Comment   smoking cessation materials not required     Counseling given: Not Answered Comment: smoking cessation materials not required   Clinical Intake:  Pre-visit preparation completed: Yes  Pain : No/denies pain     BMI - recorded: 32.13 Nutritional Status: BMI > 30  Obese Nutritional Risks: None Diabetes: Yes CBG done?: No Did pt. bring in CBG monitor from home?: No   Nutrition Risk Assessment:  Has the patient had any N/V/D within the last 2 months?  No  Does the patient have any non-healing wounds?  No  Has the patient had any unintentional weight loss or weight gain?  No   Diabetes:  Is the patient diabetic?  Yes  If diabetic, was a CBG obtained today?  No  Did the patient bring in their  glucometer from home?  No  How often do you monitor your CBG's? daily.   Financial Strains and Diabetes Management:  Are you having any financial strains with the device, your supplies or your medication? No .  Does the patient want to be seen by Chronic Care Management for management of their diabetes?  No  Would the patient like to be referred to a Nutritionist or for Diabetic Management?  No   Diabetic Exams:  Diabetic Eye Exam: Completed 06/13/18 positive retinopathy. Overdue for diabetic eye exam. Pt has been advised about the importance in completing this exam. Pt's daughter scheduling an appointment for her, previous appt postponed due to Covid.   Diabetic Foot Exam: Completed 04/22/19.   How often do you need to have someone help you when you read instructions, pamphlets, or other written materials from your doctor or pharmacy?: 1 - Never  Interpreter Needed?: No  Information entered by :: Clemetine Marker LPN  Past Medical History:  Diagnosis Date  . Acute gout   . Basal cell carcinoma   . Hyperlipidemia   . Hypertension   . Osteoporosis   . Retinopathy    Past Surgical History:  Procedure Laterality Date  . CESAREAN SECTION    . TONSILECTOMY, ADENOIDECTOMY, BILATERAL MYRINGOTOMY AND TUBES     Family History  Problem Relation Age of Onset  . Hypertension Mother   .  Osteoporosis Mother   . Cancer Mother        brain tumor  . Hodgkin's lymphoma Father   . Hypertension Sister   . Cancer Sister        unknown  . Hypertension Sister   . Diabetes Sister   . Cancer Sister 16       breast  . Breast cancer Sister 20   Social History   Socioeconomic History  . Marital status: Divorced    Spouse name: Not on file  . Number of children: 3  . Years of education: Not on file  . Highest education level: GED or equivalent  Occupational History    Employer: RETIRED  Social Needs  . Financial resource strain: Not hard at all  . Food insecurity    Worry: Never true     Inability: Never true  . Transportation needs    Medical: No    Non-medical: No  Tobacco Use  . Smoking status: Never Smoker  . Smokeless tobacco: Never Used  . Tobacco comment: smoking cessation materials not required  Substance and Sexual Activity  . Alcohol use: No    Alcohol/week: 0.0 standard drinks  . Drug use: No  . Sexual activity: Not Currently  Lifestyle  . Physical activity    Days per week: 3 days    Minutes per session: 30 min  . Stress: Only a little  Relationships  . Social Herbalist on phone: Patient refused    Gets together: Patient refused    Attends religious service: Patient refused    Active member of club or organization: Patient refused    Attends meetings of clubs or organizations: Patient refused    Relationship status: Divorced  Other Topics Concern  . Not on file  Social History Narrative  . Not on file    Outpatient Encounter Medications as of 07/09/2019  Medication Sig  . alendronate (FOSAMAX) 70 MG tablet TAKE 1 TABLET BY MOUTH ONCE A WEEK WITH  A  FULL  GLASS  OF  WATER  ON  AN  EMPTY  STOMACH  . allopurinol (ZYLOPRIM) 100 MG tablet Take 1 tablet (100 mg total) by mouth 2 (two) times daily.  Marland Kitchen aspirin 81 MG chewable tablet Chew 1 tablet by mouth daily.  . Black Cohosh 540 MG CAPS Take 1 tablet by mouth daily.  . carvedilol (COREG) 6.25 MG tablet TAKE 1 TABLET BY MOUTH TWICE DAILY WITH MEALS. NEW DOSE. RECHECK BP ONE WEEK AFTER CHANGE IN MEDICATION  . donepezil (ARICEPT) 10 MG tablet Take 1 tablet (10 mg total) by mouth at bedtime. (Patient taking differently: Take 10 mg by mouth daily. In the morning)  . empagliflozin (JARDIANCE) 25 MG TABS tablet Take 25 mg by mouth daily.  Marland Kitchen glipiZIDE (GLUCOTROL XL) 2.5 MG 24 hr tablet One daily in the am  . GLUCOSE BLOOD VI   . Icosapent Ethyl 1 g CAPS Take 2 capsules (2 g total) by mouth 2 (two) times daily. (Patient taking differently: Take 1 g by mouth 2 (two) times daily. )  . losartan  (COZAAR) 25 MG tablet Take 1 tablet (25 mg total) by mouth daily.  . Melatonin 3 MG TABS Take 3 mg by mouth at bedtime.   . MULTIPLE VITAMIN PO Take 1 tablet by mouth daily.  . QUEtiapine (SEROQUEL) 100 MG tablet Take 100 mg by mouth 3 (three) times daily. Pt takes 50 mg in am, 150 mg at 3 pm and 100  mg in evening  . rosuvastatin (CRESTOR) 20 MG tablet Take 1 tablet (20 mg total) by mouth daily.  . SitaGLIPtin-MetFORMIN HCl (JANUMET XR) 50-1000 MG TB24 Take 2 tablets by mouth daily.  Marland Kitchen VITAMIN D PO Take by mouth.  . [DISCONTINUED] Black Cohosh 20 MG TABS Take by mouth.   No facility-administered encounter medications on file as of 07/09/2019.     Activities of Daily Living In your present state of health, do you have any difficulty performing the following activities: 07/09/2019 04/22/2019  Hearing? N N  Comment declines hearing aids -  Vision? N Y  Difficulty concentrating or making decisions? [name]  Walking or climbing stairs? N N  Dressing or bathing? N N  Doing errands, shopping? N Y  Conservation officer, nature and eating ? N -  Using the Toilet? N -  In the past six months, have you accidently leaked urine? N -  Do you have problems with loss of bowel control? N -  Managing your Medications? N -  Managing your Finances? N -  Housekeeping or managing your Housekeeping? N -  Some recent data might be hidden    Patient Care Team: Steele Sizer, MD as PCP - General (Family Medicine) Mendel Corning, MD as Consulting Physician (Psychiatry) Anabel Bene, MD as Consulting Physician (Neurology)    Assessment:   This is a routine wellness examination for Neyda.  Exercise Activities and Dietary recommendations Current Exercise Habits: Home exercise routine, Time (Minutes): 30, Frequency (Times/Week): 3, Weekly Exercise (Minutes/Week): 90, Intensity: Mild, Exercise limited by: None identified  Goals    . Exercise 150 min/wk Moderate Activity     Recommend to exercise for at least 150  minutes per week.    . Increase water intake     Recommend increasing water intake to 6 glasses a day.        Fall Risk Fall Risk  07/09/2019 04/22/2019 01/21/2019 10/14/2018 07/04/2018  Falls in the past year? 0 0 0 0 Yes  Number falls in past yr: 0 0 0 - 1  Comment - - - - -  Injury with Fall? 0 0 0 - No  Risk for fall due to : - - - - Impaired vision;Impaired balance/gait;Medication side effect  Risk for fall due to: Comment - - - - wears eyeglasses; L knee pain  Follow up Falls prevention discussed - - - Falls evaluation completed;Education provided;Falls prevention discussed   FALL RISK PREVENTION PERTAINING TO THE HOME:  Any stairs in or around the home? Yes  If so, do they handrails? Yes   Home free of loose throw rugs in walkways, pet beds, electrical cords, etc? Yes  Adequate lighting in your home to reduce risk of falls? Yes   ASSISTIVE DEVICES UTILIZED TO PREVENT FALLS:  Life alert? No  Use of a cane, walker or w/c? No  Grab bars in the bathroom? Yes  Shower chair or bench in shower? Yes  Elevated toilet seat or a handicapped toilet? No   DME ORDERS:  DME order needed?  No   TIMED UP AND GO:  Was the test performed? Yes .  Length of time to ambulate 10 feet: 5 sec.   GAIT:  Appearance of gait: Gait stead-fast and without the use of an assistive device.   Education: Fall risk prevention has been discussed.  Intervention(s) required? No   Depression Screen PHQ 2/9 Scores 07/09/2019 04/22/2019 01/21/2019 10/14/2018  PHQ - 2 Score 0 0 0 1  PHQ- 9 Score - 0 0 4     Cognitive Function Pt declined 6CIT today. Feeling nervous about being able to find her daughter in the parking lot after appt.      6CIT Screen 07/04/2018 04/01/2017  What Year? 4 points 0 points  What month? 0 points 0 points  What time? 3 points 0 points  Count back from 20 0 points 0 points  Months in reverse 4 points 2 points  Repeat phrase 10 points 10 points  Total Score 21 12     Immunization History  Administered Date(s) Administered  . Fluad Quad(high Dose 65+) 07/09/2019  . Influenza Split 03/06/2007  . Influenza, High Dose Seasonal PF 07/01/2015, 08/10/2015, 08/06/2016, 08/02/2017, 07/04/2018  . Influenza-Unspecified 11/19/2014  . Pneumococcal Conjugate-13 04/06/2016  . Pneumococcal Polysaccharide-23 09/03/2006, 11/19/2014  . Tdap 11/19/2014  . Zoster 11/19/2014    Qualifies for Shingles Vaccine? Yes  Zostavax completed 2016. Due for Shingrix. Education has been provided regarding the importance of this vaccine. Pt has been advised to call insurance company to determine out of pocket expense. Advised may also receive vaccine at local pharmacy or Health Dept. Verbalized acceptance and understanding.  Tdap: Up to date  Flu Vaccine: Due for Flu vaccine. Does the patient want to receive this vaccine today?  Yes . Education has been provided regarding the importance of this vaccine but still declined. Advised may receive this vaccine at local pharmacy or Health Dept. Aware to provide a copy of the vaccination record if obtained from local pharmacy or Health Dept. Verbalized acceptance and understanding.  Pneumococcal Vaccine: Up to date   Screening Tests Health Maintenance  Topic Date Due  . FOOT EXAM  03/27/2019  . INFLUENZA VACCINE  05/16/2019  . OPHTHALMOLOGY EXAM  06/14/2019  . HEMOGLOBIN A1C  10/23/2019  . MAMMOGRAM  11/26/2019  . COLONOSCOPY  04/25/2023  . TETANUS/TDAP  11/19/2024  . DEXA SCAN  Completed  . Hepatitis C Screening  Completed  . PNA vac Low Risk Adult  Completed    Cancer Screenings:  Colorectal Screening: Completed 04/24/13. Repeat every 10 years;   Mammogram: Completed 11/25/18. Repeat every year;   Bone Density: Completed 11/25/18. Results reflect  OSTEOPENIA. Repeat every 2 years.   Lung Cancer Screening: (Low Dose CT Chest recommended if Age 30-80 years, 30 pack-year currently smoking OR have quit w/in 15years.) does not  qualify.    Additional Screening:  Hepatitis C Screening: does qualify; Completed 12/16/15  Vision Screening: Recommended annual ophthalmology exams for early detection of glaucoma and other disorders of the eye. Is the patient up to date with their annual eye exam?  No - postponed due to Covid Who is the provider or what is the name of the office in which the pt attends annual eye exams? [place] Screening: Recommended annual dental exams for proper oral hygiene  Community Resource Referral:  CRR required this visit?  No      Plan:     I have personally reviewed and addressed the Medicare Annual Wellness questionnaire and have noted the following in the patient's chart:  A. Medical and social history B. Use of alcohol, tobacco or illicit drugs  C. Current medications and supplements D. Functional ability and status E.  Nutritional status F.  Physical activity G. Advance directives H. List of other physicians I.  Hospitalizations, surgeries, and ER visits in previous 12 months J.  [place] such as hearing and vision if needed, cognitive  and depression L. Referrals and appointments   In addition, I have reviewed and discussed with patient certain preventive protocols, quality metrics, and best practice recommendations. A written personalized care plan for preventive services as well as general preventive health recommendations were provided to patient.   Signed,  Clemetine Marker, LPN Nurse Health Advisor   Nurse Notes: none

## 2019-07-10 ENCOUNTER — Ambulatory Visit: Payer: Medicare Other

## 2019-08-03 ENCOUNTER — Encounter: Payer: Self-pay | Admitting: Podiatry

## 2019-08-03 ENCOUNTER — Ambulatory Visit (INDEPENDENT_AMBULATORY_CARE_PROVIDER_SITE_OTHER): Payer: Medicare Other | Admitting: Podiatry

## 2019-08-03 ENCOUNTER — Other Ambulatory Visit: Payer: Self-pay

## 2019-08-03 DIAGNOSIS — B351 Tinea unguium: Secondary | ICD-10-CM | POA: Diagnosis not present

## 2019-08-03 DIAGNOSIS — M79675 Pain in left toe(s): Secondary | ICD-10-CM

## 2019-08-03 DIAGNOSIS — M79674 Pain in right toe(s): Secondary | ICD-10-CM

## 2019-08-03 NOTE — Progress Notes (Signed)
Complaint:  Visit Type: Patient returns to my office for continued preventative foot care services. Complaint: Patient states" my nails have grown long and thick and become painful to walk and wear shoes" Patient has been diagnosed with DM with no foot complications. The patient presents for preventative foot care services.  Podiatric Exam: Vascular: dorsalis pedis and posterior tibial pulses are palpable bilateral. Capillary return is immediate. Temperature gradient is WNL. Skin turgor WNL  Sensorium: Normal Semmes Weinstein monofilament test. Normal tactile sensation bilaterally. Nail Exam: Pt has thick disfigured discolored nails with subungual debris noted bilateral entire nail hallux through fifth toenails Ulcer Exam: There is no evidence of ulcer or pre-ulcerative changes or infection. Orthopedic Exam: Muscle tone and strength are WNL. No limitations in general ROM. No crepitus or effusions noted. Foot type and digits show no abnormalities. Bony prominences are unremarkable. Skin: No Porokeratosis. No infection or ulcers  Diagnosis:  Onychomycosis, , Pain in right toe, pain in left toes  Treatment & Plan Procedures and Treatment: Consent by patient was obtained for treatment procedures.   Debridement of mycotic and hypertrophic toenails, 1 through 5 bilateral and clearing of subungual debris. No ulceration, no infection noted.  Return Visit-Office Procedure: Patient instructed to return to the office for a follow up visit 3 months for continued evaluation and treatment.    Dilraj Killgore DPM 

## 2019-08-07 ENCOUNTER — Other Ambulatory Visit: Payer: Self-pay | Admitting: Family Medicine

## 2019-08-07 DIAGNOSIS — I10 Essential (primary) hypertension: Secondary | ICD-10-CM

## 2019-09-23 ENCOUNTER — Other Ambulatory Visit: Payer: Self-pay | Admitting: Family Medicine

## 2019-09-23 DIAGNOSIS — M109 Gout, unspecified: Secondary | ICD-10-CM

## 2019-09-23 DIAGNOSIS — E1129 Type 2 diabetes mellitus with other diabetic kidney complication: Secondary | ICD-10-CM

## 2019-09-23 DIAGNOSIS — R809 Proteinuria, unspecified: Secondary | ICD-10-CM

## 2019-09-25 ENCOUNTER — Other Ambulatory Visit: Payer: Self-pay | Admitting: Family Medicine

## 2019-09-25 DIAGNOSIS — E1129 Type 2 diabetes mellitus with other diabetic kidney complication: Secondary | ICD-10-CM

## 2019-09-25 DIAGNOSIS — E785 Hyperlipidemia, unspecified: Secondary | ICD-10-CM

## 2019-09-27 NOTE — Telephone Encounter (Signed)
Requested Prescriptions  Pending Prescriptions Disp Refills  . losartan (COZAAR) 25 MG tablet [Pharmacy Med Name: Losartan Potassium 25 MG Oral Tablet] 90 tablet 0    Sig: Take 1 tablet by mouth once daily     Cardiovascular:  Angiotensin Receptor Blockers Failed - 09/25/2019  7:36 PM      Failed - Cr in normal range and within 180 days    Creat  Date Value Ref Range Status  10/14/2018 1.32 (H) 0.50 - 0.99 mg/dL Final    Comment:    For patients >23 years of age, the reference limit for Creatinine is approximately 13% higher for people identified as African-American. .    Creatinine, Urine  Date Value Ref Range Status  10/14/2018 54 20 - 275 mg/dL Final         Failed - K in normal range and within 180 days    Potassium  Date Value Ref Range Status  10/14/2018 4.5 3.5 - 5.3 mmol/L Final         Passed - Patient is not pregnant      Passed - Last BP in normal range    BP Readings from Last 1 Encounters:  07/09/19 138/88         Passed - Valid encounter within last 6 months    Recent Outpatient Visits          5 months ago Type 2 diabetes mellitus with microalbuminuria, without long-term current use of insulin [facility])   [facility] [place], Drue Stager, MD   8 months ago Type 2 diabetes mellitus with microalbuminuria, without long-term current use of insulin [facility])   [facility] [place], Drue Stager, MD   11 months ago Type 2 diabetes mellitus with microalbuminuria, without long-term current use of insulin [facility])   [place], [place], FNP   1 year ago Type 2 diabetes mellitus with microalbuminuria, without long-term current use of insulin [facility])   [facility] [place], Drue Stager, MD   1 year ago Uncontrolled type 2 diabetes mellitus with microalbuminuria, without long-term current use of insulin [facility])   [facility] Steele Sizer, MD      Future Appointments            In 65  months [facility], [place]            . rosuvastatin (CRESTOR) 20 MG tablet [Pharmacy Med Name: Rosuvastatin Calcium 20 MG Oral Tablet] 90 tablet 0    Sig: Take 1 tablet by mouth once daily     Cardiovascular:  Antilipid - Statins Failed - 09/25/2019  7:36 PM      Failed - HDL in normal range and within 360 days    HDL  Date Value Ref Range Status  10/14/2018 45 (L) >50 mg/dL Final  12/15/2015 47 >39 mg/dL Final         Failed - Triglycerides in normal range and within 360 days    Triglycerides  Date Value Ref Range Status  10/14/2018 288 (H) <150 mg/dL Final    Comment:    . If a non-fasting specimen was collected, consider repeat triglyceride testing on a fasting specimen if clinically indicated.  Yates Decamp et al. J. of Clin. Lipidol. [id number]. Marland Kitchen          Passed - Total Cholesterol in normal range and within 360 days    Cholesterol, Total  Date Value Ref Range Status  12/15/2015 135 100 - 199  mg/dL Final   Cholesterol  Date Value Ref Range Status  10/14/2018 111 <200 mg/dL Final         Passed - LDL in normal range and within 360 days    LDL Cholesterol (Calc)  Date Value Ref Range Status  10/14/2018 32 mg/dL (calc) Final    Comment:    Reference range: <100 . Desirable range <100 mg/dL for primary prevention;   <70 mg/dL for patients with CHD or diabetic patients  with > or = 2 CHD risk factors. Marland Kitchen LDL-C is now calculated using the Martin-Hopkins  calculation, which is a validated novel method providing  better accuracy than the Friedewald equation in the  estimation of LDL-C.  Cresenciano Genre et al. Annamaria Helling. WG:[id number]): [id number]  (http://education.[facility].com/faq/FAQ164)          Passed - Patient is not pregnant      Passed - Valid encounter within last 12 months    Recent Outpatient Visits          5 months ago Type 2 diabetes mellitus with microalbuminuria, without long-term current use of insulin [facility])   [facility] [place], Drue Stager, MD   8 months ago Type 2 diabetes mellitus with microalbuminuria, without long-term current use of insulin [facility])   [facility] [place], Drue Stager, MD   11 months ago Type 2 diabetes mellitus with microalbuminuria, without long-term current use of insulin [facility])   [place], [place], FNP   1 year ago Type 2 diabetes mellitus with microalbuminuria, without long-term current use of insulin [facility])   [facility] [place], Drue Stager, MD   1 year ago Uncontrolled type 2 diabetes mellitus with microalbuminuria, without long-term current use of insulin [facility])   [facility] Steele Sizer, MD      Future Appointments            In 68 months [facility], [facility]

## 2019-09-28 ENCOUNTER — Encounter: Payer: Self-pay | Admitting: Family Medicine

## 2019-10-28 ENCOUNTER — Encounter: Payer: Self-pay | Admitting: Family Medicine

## 2019-10-28 ENCOUNTER — Ambulatory Visit (INDEPENDENT_AMBULATORY_CARE_PROVIDER_SITE_OTHER): Payer: Medicare Other | Admitting: Family Medicine

## 2019-10-28 ENCOUNTER — Other Ambulatory Visit: Payer: Self-pay

## 2019-10-28 VITALS — Wt 152.0 lb

## 2019-10-28 DIAGNOSIS — E785 Hyperlipidemia, unspecified: Secondary | ICD-10-CM

## 2019-10-28 DIAGNOSIS — M109 Gout, unspecified: Secondary | ICD-10-CM

## 2019-10-28 DIAGNOSIS — I129 Hypertensive chronic kidney disease with stage 1 through stage 4 chronic kidney disease, or unspecified chronic kidney disease: Secondary | ICD-10-CM

## 2019-10-28 DIAGNOSIS — E1129 Type 2 diabetes mellitus with other diabetic kidney complication: Secondary | ICD-10-CM

## 2019-10-28 DIAGNOSIS — R440 Auditory hallucinations: Secondary | ICD-10-CM | POA: Diagnosis not present

## 2019-10-28 DIAGNOSIS — N1831 Chronic kidney disease, stage 3a: Secondary | ICD-10-CM

## 2019-10-28 DIAGNOSIS — R809 Proteinuria, unspecified: Secondary | ICD-10-CM

## 2019-10-28 DIAGNOSIS — I10 Essential (primary) hypertension: Secondary | ICD-10-CM

## 2019-10-28 DIAGNOSIS — F325 Major depressive disorder, single episode, in full remission: Secondary | ICD-10-CM

## 2019-10-28 DIAGNOSIS — F0391 Unspecified dementia with behavioral disturbance: Secondary | ICD-10-CM

## 2019-10-28 MED ORDER — CARVEDILOL 6.25 MG PO TABS: ORAL_TABLET | ORAL | 0 refills | Status: DC

## 2019-10-28 MED ORDER — ROSUVASTATIN CALCIUM 20 MG PO TABS: 20.0000 mg | ORAL_TABLET | Freq: Every day | ORAL | 0 refills | Status: DC

## 2019-10-28 NOTE — Progress Notes (Signed)
Name: Lindsey [name]   MRN: [id number]    DOB: 14-Sep-1951   Date:10/28/2019       Progress Note  Subjective  Chief Complaint  Chief Complaint  Patient presents with  . Form for [facility]  . Mild Dementia    I connected with  [name] on 10/28/19 at  3:20 PM EST by telephone and verified that I am speaking with the correct person using two identifiers.  I discussed the limitations, risks, security and privacy concerns of performing an evaluation and management service by telephone and the availability of in person appointments. Staff also discussed with the patient that there may be a patient responsible charge related to this service. Patient Location: at home  Provider Location: [facility] Also present: daughter [name]   HPI  DMII:HgbA1Chas been out of control 8.1%-8.2%8.2%,6.9%, 6.7% and last visit it was 7.4% Cannot tolerate injectionssowe addedGlipizide in June 2019, daughter states had two episodes of hypoglycemia secondary to skipping a meal. No longer having a lot of sweets of sodas. She denies polyphagia, polydipsia or polyuria.She has obesity, CKI , dyslipidemia and obesity   Osteopenia: she was on Evista, but switched to Fosamax because of insurance changes. She is doing well,repeat bone density was done 11/2018 and stable.   Auditory Hallucination: seen by Dr. Melrose Nakayama.Recentlyseen byDr.Koenigandshe is taking Seroquel100TIDShe isalsoonAricept, the reason was increase in daytime episodes of hallucinations.She states hallucinations frightens hers.Patient states episodes of hallucinations are stable, unless she skips medications   HTN: denies dizziness.Nochest pain, or increase in SOB.Seems to take medications most of the time. No dizziness as far as daughter can tell, and patient denies   Hyperlipidemia: taking Crestor and denies side effects of medication, including no muscle aches, she will return  for labs   Major Depression in remission:  taking medication, gets anxious occasionally, but doing better, phq 9 is normal today , daughter does not think she is depressed but seems anxious   Dementia: seen by Dr. Melrose Nakayama, she states she continues to have auditoryhallucinations,but not as frequent.  . She still lives alone, daughter is managing her bills, doctors visits, and she also dispenses her medications, also has a alarm that dispenses her medication but sometimes still does not take medication and daughter finds medication around the house She has not been cooking and daughter buys her food but she is not eating unless if someone is eating or sitting with her. She did not recognized her son recently that stay at the house with her. Daughter states gradually getting worse over the past month, and even worse past couple of weeks, she thinks her mother would benefit . She dropped off a form to be filled out for [place] so she can have someone at her house for a few hours a day   Patient Active Problem List   Diagnosis Date Noted  . Pain due to onychomycosis of toenails of both feet 05/04/2019  . 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 Uncontrolled type 2 diabetes mellitus with microalbuminuria, without long-term current use of insulin ([place]) 12/12/2015  . Mild dementia ([place]) 11/01/2015  . Auditory hallucination 07/26/2015  . Anemia of chronic disease 07/05/2015  . Poorly controlled type 2 diabetes mellitus with cataract ([place]) 04/06/2015  . Cataract 04/06/2015  . Benign essential HTN 04/02/2015  . Controlled gout 04/02/2015  . Dyslipidemia 04/02/2015  . Depression, major, recurrent, mild ([place]) 04/02/2015  . Fibromyalgia syndrome 04/02/2015  .  Chronic insomnia 04/02/2015  . Cognitive decline 04/02/2015  . Nodular basal cell carcinoma 04/02/2015  . Morbid obesity ([place]) 04/02/2015  . Osteopenia 04/02/2015  . Calcium blood increased 04/02/2015     Past Surgical History:  Procedure Laterality Date  . CESAREAN SECTION    . TONSILECTOMY, ADENOIDECTOMY, BILATERAL MYRINGOTOMY AND TUBES      Family History  Problem Relation Age of Onset  . Hypertension Mother   . Osteoporosis Mother   . Cancer Mother        brain tumor  . Hodgkin's lymphoma Father   . Hypertension Sister   . Cancer Sister        unknown  . Hypertension Sister   . Diabetes Sister   . Cancer Sister 58       breast  . Breast cancer Sister 30      Current Outpatient Medications:  .  alendronate (FOSAMAX) 70 MG tablet, TAKE 1 TABLET BY MOUTH ONCE A WEEK WITH  A  FULL  GLASS  OF  WATER  ON  AN  EMPTY  STOMACH, Disp: 12 tablet, Rfl: 5 .  allopurinol (ZYLOPRIM) 100 MG tablet, Take 1 tablet by mouth twice daily, Disp: 180 tablet, Rfl: 0 .  aspirin 81 MG chewable tablet, Chew 1 tablet by mouth daily., Disp: , Rfl:  .  Black Cohosh 540 MG CAPS, Take 1 tablet by mouth daily., Disp: , Rfl:  .  carvedilol (COREG) 6.25 MG tablet, TAKE 1 TABLET BY MOUTH TWICE DAILY WITH MEALS. NEW DOSE. RECHECK BLOOD PRESSURE ONE WEEK AFTER CHANGE IN MEDICATION. NEED APPOINTMENT., Disp: 180 tablet, Rfl: 0 .  donepezil (ARICEPT) 10 MG tablet, Take 1 tablet (10 mg total) by mouth at bedtime. (Patient taking differently: Take 10 mg by mouth daily. In the morning), Disp: 90 tablet, Rfl: 1 .  glipiZIDE (GLUCOTROL XL) 2.5 MG 24 hr tablet, TAKE 1 TABLET BY MOUTH ONCE DAILY IN THE MORNING, Disp: 90 tablet, Rfl: 0 .  GLUCOSE BLOOD VI, , Disp: , Rfl:  .  Icosapent Ethyl 1 g CAPS, Take 2 capsules (2 g total) by mouth 2 (two) times daily. (Patient taking differently: Take 1 g by mouth 2 (two) times daily. ), Disp: 360 capsule, Rfl: 1 .  JARDIANCE 25 MG TABS tablet, Take 1 tablet by mouth once daily, Disp: 90 tablet, Rfl: 0 .  losartan (COZAAR) 25 MG tablet, Take 1 tablet by mouth once daily, Disp: 90 tablet, Rfl: 0 .  Melatonin 5 MG CAPS, Take 5 mg by mouth at bedtime. , Disp: , Rfl:  .  memantine  (NAMENDA) 5 MG tablet, Take 5 mg by mouth 2 (two) times daily. , Disp: , Rfl:  .  MULTIPLE VITAMIN PO, Take 1 tablet by mouth daily., Disp: , Rfl:  .  QUEtiapine (SEROQUEL) 100 MG tablet, takes 50 mg in the morning, 150 mg in the evening, and 100 mg at night., Disp: , Rfl:  .  rosuvastatin (CRESTOR) 20 MG tablet, Take 1 tablet (20 mg total) by mouth daily., Disp: 90 tablet, Rfl: 0 .  SitaGLIPtin-MetFORMIN HCl (JANUMET XR) 50-1000 MG TB24, Take 2 tablets by mouth daily., Disp: 180 tablet, Rfl: 1 .  VITAMIN D PO, Take 1,000 mg by mouth daily. , Disp: , Rfl:   Allergies  Allergen Reactions  . Ace Inhibitors     I personally reviewed active problem list, medication list, allergies, family history, social history with the patient/caregiver today.   ROS  Ten systems reviewed and is  negative except as mentioned in HPI   Objective  Virtual encounter, vitals not obtained.  Body mass index is 30.7 kg/m.  Physical Exam  Awake, alert and oriented  PHQ2/9: Depression screen [employer] 2/9 10/28/2019 07/09/2019 04/22/2019 01/21/2019 10/14/2018  Decreased Interest 0 0 0 0 0  Down, Depressed, Hopeless 0 0 0 0 1  PHQ - 2 Score 0 0 0 0 1  Altered sleeping 1 - 0 0 1  Tired, decreased energy 0 - 0 0 1  Change in appetite 1 - 0 0 1  Feeling bad or failure about yourself  0 - 0 0 0  Trouble concentrating 1 - 0 0 0  Moving slowly or fidgety/restless 1 - 0 0 0  Suicidal thoughts 0 - 0 0 -  PHQ-9 Score 4 - 0 0 4  Difficult doing work/chores Somewhat difficult - Not difficult at all - Somewhat difficult  Some recent data might be hidden   PHQ-2/9 Result is negative.    Fall Risk: Fall Risk  10/28/2019 07/09/2019 04/22/2019 01/21/2019 10/14/2018  Falls in the past year? 0 0 0 0 0  Number falls in past yr: 0 0 0 0 -  Comment - - - - -  Injury with Fall? 0 0 0 0 -  Risk for fall due to : - - - - -  Risk for fall due to: Comment - - - - -  Follow up - Falls prevention discussed - - -    Assessment &  Plan  1. Type 2 diabetes mellitus with microalbuminuria, without long-term current use of insulin (HCC)  - Microalbumin / creatinine urine ratio - Hemoglobin A1c  2. Morbid obesity ([place])  Losing weight, but seems to forget to eat   3. Benign essential HTN  - COMPLETE METABOLIC PANEL WITH GFR - carvedilol (COREG) 6.25 MG tablet; TAKE 1 TABLET BY MOUTH TWICE DAILY WITH MEALS. NEW DOSE. RECHECK BLOOD PRESSURE ONE WEEK AFTER CHANGE IN MEDICATION. NEED APPOINTMENT.  Dispense: 180 tablet; Refill: 0  4. Auditory hallucination   5. Dementia with behavioral disturbance, unspecified dementia type ([place])  Getting worse and we will try to get her approved for PCS  6. Dyslipidemia  - Lipid panel - rosuvastatin (CRESTOR) 20 MG tablet; Take 1 tablet (20 mg total) by mouth daily.  Dispense: 90 tablet; Refill: 0  7. Depression, major, in remission  ([place])  Doing well at this time  8. Controlled gout  No problems at this time  9. Stage 3a chronic kidney disease  - CBC with Differential/Platelet - Parathyroid hormone, intact (no Ca) - VITAMIN D 25 Hydroxy (Vit-D Deficiency, Fractures) I discussed the assessment and treatment plan with the patient. The patient was provided an opportunity to ask questions and all were answered. The patient agreed with the plan and demonstrated an understanding of the instructions.   The patient was advised to call back or seek an in-person evaluation if the symptoms worsen or if the condition fails to improve as anticipated.  I provided 25  minutes of non-face-to-face time during this encounter.  Loistine Chance, MD

## 2019-10-30 ENCOUNTER — Encounter: Payer: Self-pay | Admitting: Family Medicine

## 2019-10-30 DIAGNOSIS — I10 Essential (primary) hypertension: Secondary | ICD-10-CM | POA: Diagnosis not present

## 2019-10-30 DIAGNOSIS — N1831 Chronic kidney disease, stage 3a: Secondary | ICD-10-CM | POA: Diagnosis not present

## 2019-10-30 DIAGNOSIS — E1129 Type 2 diabetes mellitus with other diabetic kidney complication: Secondary | ICD-10-CM | POA: Diagnosis not present

## 2019-10-30 DIAGNOSIS — R809 Proteinuria, unspecified: Secondary | ICD-10-CM | POA: Diagnosis not present

## 2019-10-30 DIAGNOSIS — E785 Hyperlipidemia, unspecified: Secondary | ICD-10-CM | POA: Diagnosis not present

## 2019-11-02 LAB — CBC WITH DIFFERENTIAL/PLATELET
Absolute Monocytes: 388 cells/uL (ref 200–950)
Basophils Absolute: 61 cells/uL (ref 0–200)
Basophils Relative: 0.9 %
Eosinophils Absolute: 190 cells/uL (ref 15–500)
Eosinophils Relative: 2.8 %
HCT: 34.4 % — ABNORMAL LOW (ref 35.0–45.0)
Hemoglobin: 11.3 g/dL — ABNORMAL LOW (ref 11.7–15.5)
Lymphs Abs: 2006 cells/uL (ref 850–3900)
MCH: 28.5 pg (ref 27.0–33.0)
MCHC: 32.8 g/dL (ref 32.0–36.0)
MCV: 86.6 fL (ref 80.0–100.0)
MPV: 12.4 fL (ref 7.5–12.5)
Monocytes Relative: 5.7 %
Neutro Abs: 4155 cells/uL (ref 1500–7800)
Neutrophils Relative %: 61.1 %
Platelets: 191 10*3/uL (ref 140–400)
RBC: 3.97 10*6/uL (ref 3.80–5.10)
RDW: 13.9 % (ref 11.0–15.0)
Total Lymphocyte: 29.5 %
WBC: 6.8 10*3/uL (ref 3.8–10.8)

## 2019-11-02 LAB — COMPLETE METABOLIC PANEL WITH GFR
AG Ratio: 2 (calc) (ref 1.0–2.5)
ALT: 22 U/L (ref 6–29)
AST: 26 U/L (ref 10–35)
Albumin: 4.2 g/dL (ref 3.6–5.1)
Alkaline phosphatase (APISO): 55 U/L (ref 37–153)
BUN/Creatinine Ratio: 15 (calc) (ref 6–22)
BUN: 19 mg/dL (ref 7–25)
CO2: 30 mmol/L (ref 20–32)
Calcium: 10.5 mg/dL — ABNORMAL HIGH (ref 8.6–10.4)
Chloride: 102 mmol/L (ref 98–110)
Creat: 1.25 mg/dL — ABNORMAL HIGH (ref 0.60–0.93)
GFR, Est African American: 50 mL/min/{1.73_m2} — ABNORMAL LOW (ref >=60)
GFR, Est Non African American: 44 mL/min/{1.73_m2} — ABNORMAL LOW (ref >=60)
Globulin: 2.1 g/dL (calc) (ref 1.9–3.7)
Glucose, Bld: 150 mg/dL — ABNORMAL HIGH (ref 65–99)
Potassium: 4.2 mmol/L (ref 3.5–5.3)
Sodium: 140 mmol/L (ref 135–146)
Total Bilirubin: 0.5 mg/dL (ref 0.2–1.2)
Total Protein: 6.3 g/dL (ref 6.1–8.1)

## 2019-11-02 LAB — HEMOGLOBIN A1C
Hgb A1c MFr Bld: 6.9 % of total Hgb — ABNORMAL HIGH (ref <5.7)
Mean Plasma Glucose: 151 (calc)
eAG (mmol/L): 8.4 (calc)

## 2019-11-02 LAB — VITAMIN D 25 HYDROXY (VIT D DEFICIENCY, FRACTURES): Vit D, 25-Hydroxy: 61 ng/mL (ref 30–100)

## 2019-11-02 LAB — MICROALBUMIN / CREATININE URINE RATIO
Creatinine, Urine: 62 mg/dL (ref 20–275)
Microalb Creat Ratio: 27 mcg/mg creat (ref <30)
Microalb, Ur: 1.7 mg/dL

## 2019-11-02 LAB — LIPID PANEL
Cholesterol: 102 mg/dL (ref <200)
HDL: 32 mg/dL — ABNORMAL LOW (ref >=50)
LDL Cholesterol (Calc): 44 mg/dL (calc)
Non-HDL Cholesterol (Calc): 70 mg/dL (calc) (ref <130)
Total CHOL/HDL Ratio: 3.2 (calc) (ref <5.0)
Triglycerides: 182 mg/dL — ABNORMAL HIGH (ref <150)

## 2019-11-02 LAB — PARATHYROID HORMONE, INTACT (NO CA): PTH: 31 pg/mL (ref 14–64)

## 2019-11-05 ENCOUNTER — Emergency Department
Admission: EM | Admit: 2019-11-05 | Discharge: 2019-11-05 | Disposition: A | Discharge status: Home / Self Care | Payer: Medicare Other | Attending: Emergency Medicine | Admitting: Emergency Medicine

## 2019-11-05 ENCOUNTER — Emergency Department: Payer: Medicare Other

## 2019-11-05 ENCOUNTER — Other Ambulatory Visit: Payer: Self-pay

## 2019-11-05 DIAGNOSIS — E119 Type 2 diabetes mellitus without complications: Secondary | ICD-10-CM | POA: Insufficient documentation

## 2019-11-05 DIAGNOSIS — I10 Essential (primary) hypertension: Secondary | ICD-10-CM | POA: Diagnosis not present

## 2019-11-05 DIAGNOSIS — F0390 Unspecified dementia without behavioral disturbance: Secondary | ICD-10-CM | POA: Diagnosis not present

## 2019-11-05 DIAGNOSIS — R519 Headache, unspecified: Secondary | ICD-10-CM | POA: Diagnosis not present

## 2019-11-05 DIAGNOSIS — Z7984 Long term (current) use of oral hypoglycemic drugs: Secondary | ICD-10-CM | POA: Diagnosis not present

## 2019-11-05 DIAGNOSIS — Z79899 Other long term (current) drug therapy: Secondary | ICD-10-CM | POA: Diagnosis not present

## 2019-11-05 DIAGNOSIS — R4689 Other symptoms and signs involving appearance and behavior: Secondary | ICD-10-CM | POA: Diagnosis not present

## 2019-11-05 DIAGNOSIS — Z20822 Contact with and (suspected) exposure to covid-19: Secondary | ICD-10-CM | POA: Diagnosis not present

## 2019-11-05 DIAGNOSIS — R9431 Abnormal electrocardiogram [ECG] [EKG]: Secondary | ICD-10-CM | POA: Diagnosis not present

## 2019-11-05 DIAGNOSIS — F919 Conduct disorder, unspecified: Secondary | ICD-10-CM | POA: Diagnosis present

## 2019-11-05 LAB — COMPREHENSIVE METABOLIC PANEL
ALT: 29 U/L (ref 0–44)
AST: 35 U/L (ref 15–41)
Albumin: 3.8 g/dL (ref 3.5–5.0)
Alkaline Phosphatase: 61 U/L (ref 38–126)
Anion gap: 10 (ref 5–15)
BUN: 20 mg/dL (ref 8–23)
CO2: 26 mmol/L (ref 22–32)
Calcium: 9.8 mg/dL (ref 8.9–10.3)
Chloride: 105 mmol/L (ref 98–111)
Creatinine, Ser: 1.02 mg/dL — ABNORMAL HIGH (ref 0.44–1.00)
GFR calc Af Amer: >60 mL/min (ref >60)
GFR calc non Af Amer: 56 mL/min — ABNORMAL LOW (ref >60)
Glucose, Bld: 170 mg/dL — ABNORMAL HIGH (ref 70–99)
Potassium: 3.7 mmol/L (ref 3.5–5.1)
Sodium: 141 mmol/L (ref 135–145)
Total Bilirubin: 0.5 mg/dL (ref 0.3–1.2)
Total Protein: 6.4 g/dL — ABNORMAL LOW (ref 6.5–8.1)

## 2019-11-05 LAB — URINALYSIS, COMPLETE (UACMP) WITH MICROSCOPIC
Bilirubin Urine: NEGATIVE
Glucose, UA: >=500 mg/dL — AB
Hgb urine dipstick: NEGATIVE
Ketones, ur: NEGATIVE mg/dL
Leukocytes,Ua: NEGATIVE
Nitrite: NEGATIVE
Protein, ur: NEGATIVE mg/dL
Specific Gravity, Urine: 1.005 (ref 1.005–1.030)
Squamous Epithelial / HPF: NONE SEEN (ref 0–5)
pH: 5 (ref 5.0–8.0)

## 2019-11-05 LAB — CBC
HCT: 33.9 % — ABNORMAL LOW (ref 36.0–46.0)
Hemoglobin: 10.9 g/dL — ABNORMAL LOW (ref 12.0–15.0)
MCH: 27.9 pg (ref 26.0–34.0)
MCHC: 32.2 g/dL (ref 30.0–36.0)
MCV: 86.7 fL (ref 80.0–100.0)
Platelets: 192 10*3/uL (ref 150–400)
RBC: 3.91 MIL/uL (ref 3.87–5.11)
RDW: 14.2 % (ref 11.5–15.5)
WBC: 7 10*3/uL (ref 4.0–10.5)
nRBC: 0 % (ref 0.0–0.2)

## 2019-11-05 LAB — TROPONIN I (HIGH SENSITIVITY): Troponin I (High Sensitivity): 2 ng/L (ref <18)

## 2019-11-05 NOTE — Discharge Instructions (Addendum)
Please seek medical attention for any high fevers, chest pain, shortness of breath, change in behavior, persistent vomiting, bloody stool or any other new or concerning symptoms.  

## 2019-11-05 NOTE — ED Triage Notes (Addendum)
Pt in with co dizziness that started today and slight frontal headache. Pt denies any dizziness at this time, no co n.v.d or dysuria. Denies any recent illness, no shob. Daughter states she has been angry today and cursing which is unusual for her. She has a hx of dementia but has never shown aggression.

## 2019-11-05 NOTE — ED Notes (Signed)
Pt to ED with daughter at bedside, pt is poor historian d/t dementia hx. Pt daughter states she brought mother to ED for "splitting headache" and also states her mother was c/o dizziness.  Pt denies headache at this time or dizziness.  When questioned where she is, pt states she is at the dentist office, is also disoriented to year.  Pt daughter confirms pt lives alone, but states she assists her with meals.  Pt in NAD at this time, does not report any complaints.

## 2019-11-05 NOTE — ED Provider Notes (Signed)
[facility] Emergency Department Provider Note   ____________________________________________   I have reviewed the triage vital signs and the nursing notes.   HISTORY  Chief Complaint Abnormal behavior  History limited by and level 5 caveat due to: Dementia   HPI Lindsey [name] is a 69 y.o. female who presents to the emergency department today with concerns for abnormal behavior as well as headache.  Companion states that the patient had a slight episode yesterday where she became more aggressive than normal.  Today however she started cussing and was very aggressive.  By the time my exam she was essentially back to baseline.  After her episode and outburst the patient was complaining of some frontal headache.  Patient has never had an episode like this before.  No recent illness.  No recent fevers, nausea or vomiting.  No new medication.  No change in diet.  Records reviewed. Per medical record review patient has a history of HLD, HTN.   Past Medical History:  Diagnosis Date  . Acute gout   . Basal cell carcinoma   . Hyperlipidemia   . Hypertension   . Osteoporosis   . Retinopathy     Patient Active Problem List   Diagnosis Date Noted  . Pain due to onychomycosis of toenails of both feet 05/04/2019  . 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 Uncontrolled type 2 diabetes mellitus with microalbuminuria, without long-term current use of insulin ([place]) 12/12/2015  . Mild dementia ([place]) 11/01/2015  . Auditory hallucination 07/26/2015  . Anemia of chronic disease 07/05/2015  . Poorly controlled type 2 diabetes mellitus with cataract ([place]) 04/06/2015  . Cataract 04/06/2015  . Benign essential HTN 04/02/2015  . Controlled gout 04/02/2015  . Dyslipidemia 04/02/2015  . Depression, major, recurrent, mild ([place]) 04/02/2015  . Fibromyalgia syndrome 04/02/2015  . Chronic insomnia 04/02/2015  . Cognitive decline 04/02/2015  . Nodular  basal cell carcinoma 04/02/2015  . Morbid obesity ([place]) 04/02/2015  . Osteopenia 04/02/2015  . Calcium blood increased 04/02/2015    Past Surgical History:  Procedure Laterality Date  . CESAREAN SECTION    . TONSILECTOMY, ADENOIDECTOMY, BILATERAL MYRINGOTOMY AND TUBES      Prior to Admission medications   Medication Sig Start Date End Date Taking? Authorizing Provider  alendronate (FOSAMAX) 70 MG tablet TAKE 1 TABLET BY MOUTH ONCE A WEEK WITH  A  FULL  GLASS  OF  WATER  ON  AN  EMPTY  STOMACH 01/21/19   Ancil Boozer, Drue Stager, MD  allopurinol (ZYLOPRIM) 100 MG tablet Take 1 tablet by mouth twice daily 09/23/19   Steele Sizer, MD  aspirin 81 MG chewable tablet Chew 1 tablet by mouth daily. 03/06/07   [provider]  Black Cohosh 540 MG CAPS Take 1 tablet by mouth daily.    [provider]  carvedilol (COREG) 6.25 MG tablet TAKE 1 TABLET BY MOUTH TWICE DAILY WITH MEALS. NEW DOSE. RECHECK BLOOD PRESSURE ONE WEEK AFTER CHANGE IN MEDICATION. NEED APPOINTMENT. 10/28/19   Steele Sizer, MD  donepezil (ARICEPT) 10 MG tablet Take 1 tablet (10 mg total) by mouth at bedtime. Patient taking differently: Take 10 mg by mouth daily. In the morning 07/04/18   Steele Sizer, MD  glipiZIDE (GLUCOTROL XL) 2.5 MG 24 hr tablet TAKE 1 TABLET BY MOUTH ONCE DAILY IN THE MORNING 09/23/19   Steele Sizer, MD  GLUCOSE BLOOD VI  05/26/07   [provider]  Icosapent Ethyl 1 g CAPS Take  2 capsules (2 g total) by mouth 2 (two) times daily. Patient taking differently: Take 1 g by mouth 2 (two) times daily.  01/21/19   Steele Sizer, MD  JARDIANCE 25 MG TABS tablet Take 1 tablet by mouth once daily 09/23/19   Steele Sizer, MD  losartan (COZAAR) 25 MG tablet Take 1 tablet by mouth once daily 09/27/19   Steele Sizer, MD  Melatonin 5 MG CAPS Take 5 mg by mouth at bedtime.  07/13/16   [provider]  memantine (NAMENDA) 5 MG tablet Take 5 mg by mouth 2 (two) times daily.  09/14/19  09/13/20  Anabel Bene, MD  MULTIPLE VITAMIN PO Take 1 tablet by mouth daily. 12/28/10   [provider]  QUEtiapine (SEROQUEL) 100 MG tablet takes 50 mg in the morning, 150 mg in the evening, and 100 mg at night. 07/15/19   [provider]  rosuvastatin (CRESTOR) 20 MG tablet Take 1 tablet (20 mg total) by mouth daily. 10/28/19   Steele Sizer, MD  SitaGLIPtin-MetFORMIN HCl (JANUMET XR) 50-1000 MG TB24 Take 2 tablets by mouth daily. 01/21/19   Steele Sizer, MD  VITAMIN D PO Take 1,000 mg by mouth daily.     [provider]    Allergies Ace inhibitors  Family History  Problem Relation Age of Onset  . Hypertension Mother   . Osteoporosis Mother   . Cancer Mother        brain tumor  . Hodgkin's lymphoma Father   . Hypertension Sister   . Cancer Sister        unknown  . Hypertension Sister   . Diabetes Sister   . Cancer Sister 64       breast  . Breast cancer Sister 7    Social History Social History   Tobacco Use  . Smoking status: Never Smoker  . Smokeless tobacco: Never Used  . Tobacco comment: smoking cessation materials not required  Substance Use Topics  . Alcohol use: No    Alcohol/week: 0.0 standard drinks  . Drug use: No    Review of Systems Constitutional: No fever/chills Eyes: No visual changes. ENT: No sore throat. Cardiovascular: Denies chest pain. Respiratory: Denies shortness of breath. Gastrointestinal: No abdominal pain.  No nausea, no vomiting.  No diarrhea.   Genitourinary: Negative for dysuria. Musculoskeletal: Negative for back pain. Skin: Negative for rash. Neurological: Positive for headache.  ____________________________________________   PHYSICAL EXAM:  VITAL SIGNS: ED Triage Vitals [11/05/19 1935]  Enc Vitals Group     BP 140/84     Pulse Rate 96     Resp 20     Temp 97.6 F (36.4 C)     Temp Source Oral     SpO2 99 %     Weight 152 lb (68.9 kg)     Height 4\' 9"  (1.448 m)     Head  Circumference      Peak Flow      Pain Score 0   Constitutional: Awake and alert. Eyes: Conjunctivae are normal.  ENT      Head: Normocephalic and atraumatic.      Nose: No congestion/rhinnorhea.      Mouth/Throat: Mucous membranes are moist.      Neck: No stridor. Hematological/Lymphatic/Immunilogical: No cervical lymphadenopathy. Cardiovascular: Normal rate, regular rhythm.  No murmurs, rubs, or gallops.  Respiratory: Normal respiratory effort without tachypnea nor retractions. Breath sounds are clear and equal bilaterally. No wheezes/rales/rhonchi. Gastrointestinal: Soft and non tender. No rebound. No  guarding.  Genitourinary: Deferred Musculoskeletal: Normal range of motion in all extremities. No lower extremity edema. Neurologic:  Normal speech and language. No gross focal neurologic deficits are appreciated.  Skin:  Skin is warm, dry and intact. No rash noted. Psychiatric: Calm. Not yelling. Answering questions calmly.   ____________________________________________    LABS (pertinent positives/negatives)  Trop hs 2 UA clear, > 500 glucose, 0-5 rbc and wbc, rare bacteria CMP wnl except glu 170, cr 1.02, t pro 6.4 CBC wbc 7.0, hgb 10.9, plt 192 ____________________________________________   EKG  I, Nance Pear, attending physician, personally viewed and interpreted this EKG  EKG Time: 1937 Rate: 96 Rhythm: normal sinus rhythm Axis: left axis devation Intervals: qtc 480 QRS: narrow, q waves III ST changes: no st elevation Impression: abnormal ekg   ____________________________________________    RADIOLOGY  CT head No acute abnromality  ____________________________________________   PROCEDURES  Procedures  ____________________________________________   INITIAL IMPRESSION / ASSESSMENT AND PLAN / ED COURSE  Pertinent labs & imaging results that were available during my care of the patient were reviewed by me and considered in my medical decision  making (see chart for details).   Patient presented to the emergency department brought in because of concerns for abnormal behavior.  Apparently the patient was quite aggressive and cursing earlier today.  By time my exam patient was apparently back to baseline.  She was calm on my exam.  No yelling or cussing.  She had apparently also complained of headache shortly after the episode today.  CT head was obtained which did not show any concerning findings.  Blood work and urine also were obtained which did not show concerning findings.  Will send urine off for culture case this is a occult urinary tract infection causing the behavior change.  At this time however do think it is reasonable for patient to be discharged. ____________________________________________   FINAL CLINICAL IMPRESSION(S) / ED DIAGNOSES  Final diagnoses:  Abnormal behavior  Nonintractable headache, unspecified chronicity pattern, unspecified headache type     Note: This dictation was prepared with Dragon dictation. Any transcriptional errors that result from this process are unintentional     Nance Pear, MD 11/05/19 2322

## 2019-11-07 LAB — NOVEL CORONAVIRUS, NAA (HOSP ORDER, SEND-OUT TO REF LAB; TAT 18-24 HRS): SARS-CoV-2, NAA: NOT DETECTED

## 2019-11-07 LAB — URINE CULTURE: Culture: NO GROWTH

## 2019-11-11 ENCOUNTER — Other Ambulatory Visit: Payer: Self-pay | Admitting: Family Medicine

## 2019-11-11 DIAGNOSIS — R809 Proteinuria, unspecified: Secondary | ICD-10-CM

## 2019-11-11 DIAGNOSIS — E1129 Type 2 diabetes mellitus with other diabetic kidney complication: Secondary | ICD-10-CM

## 2019-12-09 ENCOUNTER — Other Ambulatory Visit: Payer: Self-pay | Admitting: Family Medicine

## 2019-12-09 DIAGNOSIS — E1129 Type 2 diabetes mellitus with other diabetic kidney complication: Secondary | ICD-10-CM

## 2019-12-21 ENCOUNTER — Other Ambulatory Visit: Payer: Self-pay | Admitting: Family Medicine

## 2019-12-21 DIAGNOSIS — M109 Gout, unspecified: Secondary | ICD-10-CM

## 2019-12-21 DIAGNOSIS — E1129 Type 2 diabetes mellitus with other diabetic kidney complication: Secondary | ICD-10-CM

## 2019-12-21 DIAGNOSIS — R809 Proteinuria, unspecified: Secondary | ICD-10-CM

## 2019-12-21 NOTE — Telephone Encounter (Signed)
Requested Prescriptions  Pending Prescriptions Disp Refills  . JARDIANCE 25 MG TABS tablet [Pharmacy Med Name: Jardiance 25 MG Oral Tablet]  0    Sig: Take 1 tablet by mouth once daily     Endocrinology:  Diabetes - SGLT2 Inhibitors Failed - 12/21/2019  6:19 PM      Failed - Cr in normal range and within 360 days    Creat  Date Value Ref Range Status  10/30/2019 1.25 (H) 0.60 - 0.93 mg/dL Final    Comment:    For patients >71 years of age, the reference limit for Creatinine is approximately 13% higher for people identified as African-American. .    Creatinine, Ser  Date Value Ref Range Status  11/05/2019 1.02 (H) 0.44 - 1.00 mg/dL Final   Creatinine, Urine  Date Value Ref Range Status  10/30/2019 62 20 - 275 mg/dL Final         Passed - LDL in normal range and within 360 days    LDL Cholesterol (Calc)  Date Value Ref Range Status  10/30/2019 44 mg/dL (calc) Final    Comment:    Reference range: <100 . Desirable range <100 mg/dL for primary prevention;   <70 mg/dL for patients with CHD or diabetic patients  with > or = 2 CHD risk factors. Marland Kitchen LDL-C is now calculated using the Martin-Hopkins  calculation, which is a validated novel method providing  better accuracy than the Friedewald equation in the  estimation of LDL-C.  Cresenciano Genre et al. Annamaria Helling. 5809;983(38): 2061-2068  (http://education.[employer].com/faq/FAQ164)          Passed - HBA1C is between 0 and 7.9 and within 180 days    HbA1c, POC (controlled diabetic range)  Date Value Ref Range Status  04/22/2019 7.4 (A) 0.0 - 7.0 % Final   Hgb A1c MFr Bld  Date Value Ref Range Status  10/30/2019 6.9 (H) <5.7 % of total Hgb Final    Comment:    For someone without known diabetes, a hemoglobin A1c value of 6.5% or greater indicates that they may have  diabetes and this should be confirmed with a follow-up  test. . For someone with known diabetes, a value <7% indicates  that their diabetes is well controlled  and a value  greater than or equal to 7% indicates suboptimal  control. A1c targets should be individualized based on  duration of diabetes, age, comorbid conditions, and  other considerations. . Currently, no consensus exists regarding use of hemoglobin A1c for diagnosis of diabetes for children. .          Passed - eGFR in normal range and within 360 days    GFR, Est African American  Date Value Ref Range Status  10/30/2019 50 (L) > OR = 60 mL/min/1.61m Final   GFR calc Af Amer  Date Value Ref Range Status  11/05/2019 >60 >60 mL/min Final   GFR, Est Non African American  Date Value Ref Range Status  10/30/2019 44 (L) > OR = 60 mL/min/1.712mFinal   GFR calc non Af Amer  Date Value Ref Range Status  11/05/2019 56 (L) >60 mL/min Final         Passed - Valid encounter within last 6 months    Recent Outpatient Visits          1 month ago Type 2 diabetes mellitus with microalbuminuria, without long-term current use of insulin [facility]   8 months ago Type  2 diabetes mellitus with microalbuminuria, without long-term current use of insulin [facility])   [facility] [place], Drue Stager, MD   11 months ago Type 2 diabetes mellitus with microalbuminuria, without long-term current use of insulin [facility])   [facility] [place], Drue Stager, MD   1 year ago Type 2 diabetes mellitus with microalbuminuria, without long-term current use of insulin [facility])   [place], FNP   1 year ago Type 2 diabetes mellitus with microalbuminuria, without long-term current use of insulin [facility])   [facility] Steele Sizer, MD      Future Appointments            In 2 months Ancil Boozer, Drue Stager, MD [place], [place]   In 6 months  [facility], [place]           . allopurinol (ZYLOPRIM) 100 MG tablet [Pharmacy Med Name: Allopurinol 100 MG  Oral Tablet] 180 tablet 0    Sig: Take 1 tablet by mouth twice daily     Endocrinology:  Gout Agents Failed - 12/21/2019  6:19 PM      Failed - Uric Acid in normal range and within 360 days    Uric Acid  Date Value Ref Range Status  12/15/2015 8.1 (H) 2.5 - 7.1 mg/dL Final    Comment:               Therapeutic target for gout patients: <6.0         Failed - Cr in normal range and within 360 days    Creat  Date Value Ref Range Status  10/30/2019 1.25 (H) 0.60 - 0.93 mg/dL Final    Comment:    For patients >41 years of age, the reference limit for Creatinine is approximately 13% higher for people identified as African-American. .    Creatinine, Ser  Date Value Ref Range Status  11/05/2019 1.02 (H) 0.44 - 1.00 mg/dL Final   Creatinine, Urine  Date Value Ref Range Status  10/30/2019 62 20 - 275 mg/dL Final         Passed - Valid encounter within last 12 months    Recent Outpatient Visits          1 month ago Type 2 diabetes mellitus with microalbuminuria, without long-term current use of insulin [facility])   [facility] [place], Drue Stager, MD   8 months ago Type 2 diabetes mellitus with microalbuminuria, without long-term current use of insulin [facility])   [facility] [place], Drue Stager, MD   11 months ago Type 2 diabetes mellitus with microalbuminuria, without long-term current use of insulin [facility])   [facility] [place], Drue Stager, MD   1 year ago Type 2 diabetes mellitus with microalbuminuria, without long-term current use of insulin [facility])   [place], [place], FNP   1 year ago Type 2 diabetes mellitus with microalbuminuria, without long-term current use of insulin [facility])   [facility] Steele Sizer, MD      Future Appointments            In 2 months Steele Sizer, MD [facility], [place]   In 6 months  [facility], [facility]           Signed  Prescriptions Disp Refills   glipiZIDE (GLUCOTROL XL) 2.5 MG 24 hr tablet 90 tablet 0    Sig: TAKE 1 TABLET BY  MOUTH ONCE DAILY IN THE MORNING     Endocrinology:  Diabetes - Sulfonylureas Passed - 12/21/2019  6:19 PM      Passed - HBA1C is between 0 and 7.9 and within 180 days    HbA1c, POC (controlled diabetic range)  Date Value Ref Range Status  04/22/2019 7.4 (A) 0.0 - 7.0 % Final   Hgb A1c MFr Bld  Date Value Ref Range Status  10/30/2019 6.9 (H) <5.7 % of total Hgb Final    Comment:    For someone without known diabetes, a hemoglobin A1c value of 6.5% or greater indicates that they may have  diabetes and this should be confirmed with a follow-up  test. . For someone with known diabetes, a value <7% indicates  that their diabetes is well controlled and a value  greater than or equal to 7% indicates suboptimal  control. A1c targets should be individualized based on  duration of diabetes, age, comorbid conditions, and  other considerations. . Currently, no consensus exists regarding use of hemoglobin A1c for diagnosis of diabetes for children. Renella Cunas - Valid encounter within last 6 months    Recent Outpatient Visits          1 month ago Type 2 diabetes mellitus with microalbuminuria, without long-term current use of insulin [facility])   [facility] [place], Drue Stager, MD   8 months ago Type 2 diabetes mellitus with microalbuminuria, without long-term current use of insulin [facility])   [facility] [place], Drue Stager, MD   11 months ago Type 2 diabetes mellitus with microalbuminuria, without long-term current use of insulin [facility])   [facility] [place], Drue Stager, MD   1 year ago Type 2 diabetes mellitus with microalbuminuria, without long-term current use of insulin [facility])   [place], FNP   1 year ago Type 2 diabetes mellitus with microalbuminuria, without long-term current use of  insulin [facility])   [facility] Steele Sizer, MD      Future Appointments            In 2 months Ancil Boozer, Drue Stager, MD [facility], [place]   In 6 months  [facility], [facility]

## 2019-12-21 NOTE — Telephone Encounter (Signed)
Review refills for jardiance 25 mg and allopurinol 100 mg. Too soon to refill Jardiance. Allopurinol need labs.

## 2019-12-23 NOTE — Telephone Encounter (Signed)
Received notification that transmission for Jardiance 25 mg had failed to reach pharmacy; spoke with Missy, Pharmacy Tech, and she states the transmission was received, and the prescription is ready for pickup.

## 2019-12-23 NOTE — Addendum Note (Signed)
Addended by: Chilton Greathouse on: 12/23/2019 09:14 AM   Modules accepted: Orders

## 2020-01-11 ENCOUNTER — Other Ambulatory Visit: Payer: Self-pay | Admitting: Family Medicine

## 2020-01-11 DIAGNOSIS — R809 Proteinuria, unspecified: Secondary | ICD-10-CM

## 2020-01-11 DIAGNOSIS — E1129 Type 2 diabetes mellitus with other diabetic kidney complication: Secondary | ICD-10-CM

## 2020-01-25 ENCOUNTER — Other Ambulatory Visit: Payer: Self-pay | Admitting: Family Medicine

## 2020-01-25 ENCOUNTER — Encounter: Payer: Self-pay | Admitting: Family Medicine

## 2020-01-25 DIAGNOSIS — E785 Hyperlipidemia, unspecified: Secondary | ICD-10-CM

## 2020-01-26 DIAGNOSIS — S301XXA Contusion of abdominal wall, initial encounter: Secondary | ICD-10-CM | POA: Diagnosis not present

## 2020-02-05 DIAGNOSIS — E119 Type 2 diabetes mellitus without complications: Secondary | ICD-10-CM | POA: Diagnosis not present

## 2020-02-05 LAB — HM DIABETES EYE EXAM

## 2020-02-15 ENCOUNTER — Telehealth: Payer: Self-pay | Admitting: Family Medicine

## 2020-02-15 NOTE — Chronic Care Management (AMB) (Signed)
  Chronic Care Management   Outreach Note  02/15/2020 Name: Lindsey [name] MRN: [id number] DOB: 1951-09-15  Lindsey [name] is a 69 y.o. year old female who is a primary care patient of Steele Sizer, MD. I reached out to Lindsey [name] by phone today in response to a referral sent by Lindsey [name]'s health plan.     An unsuccessful telephone outreach was attempted today. The patient was referred to the case management team for assistance with care management and care coordination.   Follow Up Plan: A HIPPA compliant phone message was left for the patient providing contact information and requesting a return call.  The care management team will reach out to the patient again over the next 7 days.  If patient returns call to provider office, please advise to call [place] at [facility], [place], [place], [place] [address] Direct Dial: [phone] [email or web address] Website: [email or web address]

## 2020-02-15 NOTE — Chronic Care Management (AMB) (Signed)
  Chronic Care Management   Note  02/15/2020 Name: Lindsey [name] MRN: [id number] DOB: 07-Jan-1951  Lindsey [name] is a 69 y.o. year old female who is a primary care patient of Steele Sizer, MD. I reached out to Lindsey [name] by phone today in response to a referral sent by Lindsey [name]'s health plan.     [name] was given information about Chronic Care Management services today including:  1. CCM service includes personalized support from designated clinical staff supervised by her physician, including individualized plan of care and coordination with other care providers 2. 24/7 contact phone numbers for assistance for urgent and routine care needs. 3. Service will only be billed when office clinical staff spend 20 minutes or more in a month to coordinate care. 4. Only one practitioner may furnish and bill the service in a calendar month. 5. The patient may stop CCM services at any time (effective at the end of the month) by phone call to the office staff. 6. The patient will be responsible for cost sharing (co-pay) of up to 20% of the service fee (after annual deductible is met).  Patient agreed to services and verbal consent obtained.   Follow up plan: Telephone appointment with care management team member scheduled for:03/03/2020  Noreene Larsson, [address], [place], [place] [address] Direct Dial: [phone] [email or web address] Website: [email or web address]

## 2020-02-17 ENCOUNTER — Other Ambulatory Visit: Payer: Self-pay | Admitting: Family Medicine

## 2020-02-17 DIAGNOSIS — I10 Essential (primary) hypertension: Secondary | ICD-10-CM

## 2020-02-26 ENCOUNTER — Other Ambulatory Visit: Payer: Self-pay

## 2020-02-26 ENCOUNTER — Ambulatory Visit (INDEPENDENT_AMBULATORY_CARE_PROVIDER_SITE_OTHER): Payer: Medicare Other | Admitting: Family Medicine

## 2020-02-26 ENCOUNTER — Encounter: Payer: Self-pay | Admitting: Family Medicine

## 2020-02-26 VITALS — BP 128/82 | HR 115 | Temp 97.7°F | Resp 16 | Ht <= 58 in | Wt 153.5 lb

## 2020-02-26 DIAGNOSIS — M109 Gout, unspecified: Secondary | ICD-10-CM

## 2020-02-26 DIAGNOSIS — R809 Proteinuria, unspecified: Secondary | ICD-10-CM

## 2020-02-26 DIAGNOSIS — F0391 Unspecified dementia with behavioral disturbance: Secondary | ICD-10-CM

## 2020-02-26 DIAGNOSIS — N1831 Chronic kidney disease, stage 3a: Secondary | ICD-10-CM

## 2020-02-26 DIAGNOSIS — E1129 Type 2 diabetes mellitus with other diabetic kidney complication: Secondary | ICD-10-CM | POA: Diagnosis not present

## 2020-02-26 DIAGNOSIS — R000 Tachycardia, unspecified: Secondary | ICD-10-CM | POA: Diagnosis not present

## 2020-02-26 DIAGNOSIS — E785 Hyperlipidemia, unspecified: Secondary | ICD-10-CM

## 2020-02-26 DIAGNOSIS — I10 Essential (primary) hypertension: Secondary | ICD-10-CM

## 2020-02-26 DIAGNOSIS — R440 Auditory hallucinations: Secondary | ICD-10-CM

## 2020-02-26 DIAGNOSIS — E611 Iron deficiency: Secondary | ICD-10-CM | POA: Diagnosis not present

## 2020-02-26 LAB — POCT GLYCOSYLATED HEMOGLOBIN (HGB A1C): Hemoglobin A1C: 7 % — AB (ref 4.0–5.6)

## 2020-02-26 MED ORDER — ICOSAPENT ETHYL 1 G PO CAPS: 1.0000 g | ORAL_CAPSULE | Freq: Two times a day (BID) | ORAL | 1 refills | Status: DC

## 2020-02-26 MED ORDER — ALLOPURINOL 100 MG PO TABS: 100.0000 mg | ORAL_TABLET | Freq: Two times a day (BID) | ORAL | 1 refills | Status: DC

## 2020-02-26 MED ORDER — GLIPIZIDE ER 2.5 MG PO TB24: 2.5000 mg | ORAL_TABLET | Freq: Every day | ORAL | 1 refills | Status: DC

## 2020-02-26 MED ORDER — JARDIANCE 25 MG PO TABS: 25.0000 mg | ORAL_TABLET | Freq: Every day | ORAL | 1 refills | Status: DC

## 2020-02-26 MED ORDER — CARVEDILOL 12.5 MG PO TABS: 12.5000 mg | ORAL_TABLET | Freq: Two times a day (BID) | ORAL | 1 refills | Status: DC

## 2020-02-26 MED ORDER — ROSUVASTATIN CALCIUM 20 MG PO TABS: 20.0000 mg | ORAL_TABLET | Freq: Every day | ORAL | 1 refills | Status: DC

## 2020-02-26 MED ORDER — JANUMET XR 50-1000 MG PO TB24: 2.0000 | ORAL_TABLET | Freq: Every day | ORAL | 1 refills | Status: DC

## 2020-02-26 NOTE — Progress Notes (Signed)
Name: Lindsey [name]   MRN: [id number]    DOB: 06-07-1951   Date:02/26/2020       Progress Note  Subjective  Chief Complaint  Chief Complaint  Patient presents with  . Hypertension  . Diabetes  . Dementia  . Dyslipidemia  . Fibromyalgia    HPI  DMII:HgbA1Chas been out of control 8.1%-8.2%8.2%,6.9%, 6.7%and last visit it was 7.4%She has an aid coming to her house daily and has been compliant with medications. She denies polyphagia, polydipsia or polyuria. No recent episodes of hypoglycemia She has obesity, CKI , dyslipidemia.  Osteopenia: she was on Evista, but switched to Fosamax because of insurance changes. She is doing well,repeat bone density was done 02/2020and stable.Denies side effects of medications   Auditory Hallucination: seen by Dr. Melrose Nakayama and also  byDr.Koenig. She is still taking Seroquel and Aricept, the reason was increase in daytime episodes of hallucinations.She states hallucinations frightens hers.Patient states episodes of hallucinations are slightly less frequent, but sill happens weekly   HTN: denies dizziness.Nochest pain, or increase in SOB. She is compliant with medication. Daughter states she gets very upset when she has a doctor visit and it is likely the cause of bp evaluation during her visit today . No dizziness as far as daughter can tell, and patient denies   Hyperlipidemia: taking Crestor and denies side effects of medication, including no muscle ache. Reviewed last labs with patient and her daughter. HDL was low. Discussed increase in fish and tree nuts on her diet   Major Depressionin remission:daughter states patient seems happy, phq 9 positive but hard to judge since she has dementia  Dementia: seen by Dr. Melrose Nakayama, she states she continues to have auditoryhallucinations,but not as frequent.  . She still lives alone, daughter is managing her bills, doctors visits, and she also dispenses her medications, since last  visit she also has an Aid that spends the mornings with her, family members check on her during the evening . She has not been cooking.  She still has lapses in her memory, but today she corrected herself. Initially she said she wants to go visit her grandmother, but she meant to say her mother.   Patient Active Problem List   Diagnosis Date Noted  . Pain due to onychomycosis of toenails of both feet 05/04/2019  . 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 Mild dementia ([place]) 11/01/2015  . Auditory hallucination 07/26/2015  . Anemia of chronic disease 07/05/2015  . Cataract 04/06/2015  . Type 2 diabetes mellitus with diabetic cataract ([place]) 04/06/2015  . Benign essential HTN 04/02/2015  . Controlled gout 04/02/2015  . Dyslipidemia 04/02/2015  . Depression, major, recurrent, mild ([place]) 04/02/2015  . Fibromyalgia syndrome 04/02/2015  . Chronic insomnia 04/02/2015  . Cognitive decline 04/02/2015  . Nodular basal cell carcinoma 04/02/2015  . Morbid obesity ([place]) 04/02/2015  . Osteopenia 04/02/2015  . Calcium blood increased 04/02/2015    Past Surgical History:  Procedure Laterality Date  . CESAREAN SECTION    . TONSILECTOMY, ADENOIDECTOMY, BILATERAL MYRINGOTOMY AND TUBES      Family History  Problem Relation Age of Onset  . Hypertension Mother   . Osteoporosis Mother   . Cancer Mother        brain tumor  . Hodgkin's lymphoma Father   . Hypertension Sister   . Cancer Sister        unknown  . Hypertension Sister   . Diabetes Sister   . Cancer Sister 60  breast  . Breast cancer Sister 2    Social History   Tobacco Use  . Smoking status: Never Smoker  . Smokeless tobacco: Never Used  . Tobacco comment: smoking cessation materials not required  Substance Use Topics  . Alcohol use: No    Alcohol/week: 0.0 standard drinks     Current Outpatient Medications:  .  alendronate (FOSAMAX) 70 MG tablet, TAKE 1 TABLET BY MOUTH ONCE A WEEK WITH   A  FULL  GLASS  OF  WATER  ON  AN  EMPTY  STOMACH, Disp: 12 tablet, Rfl: 5 .  allopurinol (ZYLOPRIM) 100 MG tablet, Take 1 tablet (100 mg total) by mouth 2 (two) times daily., Disp: 180 tablet, Rfl: 1 .  aspirin 81 MG chewable tablet, Chew 1 tablet by mouth daily., Disp: , Rfl:  .  Black Cohosh 540 MG CAPS, Take 1 tablet by mouth daily., Disp: , Rfl:  .  carvedilol (COREG) 12.5 MG tablet, Take 1 tablet (12.5 mg total) by mouth 2 (two) times daily with a meal., Disp: 180 tablet, Rfl: 1 .  donepezil (ARICEPT) 10 MG tablet, Take 1 tablet (10 mg total) by mouth at bedtime. (Patient taking differently: Take 10 mg by mouth daily. In the morning), Disp: 90 tablet, Rfl: 1 .  empagliflozin (JARDIANCE) 25 MG TABS tablet, Take 25 mg by mouth daily., Disp: 90 tablet, Rfl: 1 .  glipiZIDE (GLUCOTROL XL) 2.5 MG 24 hr tablet, Take 1 tablet (2.5 mg total) by mouth daily with breakfast., Disp: 90 tablet, Rfl: 1 .  GLUCOSE BLOOD VI, , Disp: , Rfl:  .  icosapent Ethyl (VASCEPA) 1 g capsule, Take 1 capsule (1 g total) by mouth 2 (two) times daily., Disp: 180 capsule, Rfl: 1 .  losartan (COZAAR) 25 MG tablet, Take 1 tablet by mouth once daily, Disp: 90 tablet, Rfl: 0 .  Melatonin 5 MG CAPS, Take 5 mg by mouth at bedtime. , Disp: , Rfl:  .  memantine (NAMENDA) 5 MG tablet, Take 5 mg by mouth 2 (two) times daily. , Disp: , Rfl:  .  MULTIPLE VITAMIN PO, Take 1 tablet by mouth daily., Disp: , Rfl:  .  Multiple Vitamins-Minerals (SUPER VIKAPS) TABS, Take by mouth., Disp: , Rfl:  .  QUEtiapine (SEROQUEL) 100 MG tablet, takes 50 mg in the morning, 150 mg in the evening, and 100 mg at night., Disp: , Rfl:  .  rosuvastatin (CRESTOR) 20 MG tablet, Take 1 tablet (20 mg total) by mouth daily., Disp: 90 tablet, Rfl: 1 .  SitaGLIPtin-MetFORMIN HCl (JANUMET XR) 50-1000 MG TB24, Take 2 tablets by mouth daily., Disp: 180 tablet, Rfl: 1 .  VITAMIN D PO, Take 1,000 mg by mouth daily. , Disp: , Rfl:   Allergies  Allergen Reactions  .  Ace Inhibitors     I personally reviewed active problem list, medication list, allergies, family history, social history, health maintenance with the patient/caregiver today.   ROS  Constitutional: Negative for fever or significant  weight change.  Respiratory: Negative for cough , positive shortness of breath with activity .   Cardiovascular: Negative for chest pain or palpitations.  Gastrointestinal: Negative for abdominal pain, no bowel changes.  Musculoskeletal: Negative for gait problem or joint swelling.  Skin: Negative for rash.  Neurological: Negative for dizziness or headache.  No other specific complaints in a complete review of systems (except as listed in HPI above).  Objective  Vitals:   02/26/20 1545 02/26/20 1621  BP: (!) 160/90  128/82  Pulse: (!) 123 (!) 115  Resp: 16   Temp: 97.7 F (36.5 C)   TempSrc: Temporal   SpO2: 99%   Weight: 153 lb 8 oz (69.6 kg)   Height: 4\' 9"  (1.448 m)     Body mass index is 33.22 kg/m.  Physical Exam  Constitutional: Patient appears well-developed and well-nourished. Obese  No distress.  HEENT: head atraumatic, normocephalic, pupils equal and reactive to light,  neck supple Cardiovascular: Normal rate, regular rhythm and normal heart sounds.  No murmur heard. No BLE edema. Pulmonary/Chest: Effort normal and breath sounds normal, tachycardia . No respiratory distress. Abdominal: Soft.  There is no tenderness. Psychiatric: Patient has a normal mood and affect. behavior is normal. Judgment and thought content normal.  Recent Results (from the past 2160 hour(s))  HM DIABETES EYE EXAM     Status: None   Collection Time: 02/05/20 12:00 AM  Result Value Ref Range   HM Diabetic Eye Exam No Retinopathy No Retinopathy  POCT HgB A1C     Status: Abnormal   Collection Time: 02/26/20  3:50 PM  Result Value Ref Range   Hemoglobin A1C 7.0 (A) 4.0 - 5.6 %   HbA1c POC (<> result, manual entry)     HbA1c, POC (prediabetic range)      HbA1c, POC (controlled diabetic range)        PHQ2/9: Depression screen [facility] 2/9 02/26/2020 10/28/2019 07/09/2019 04/22/2019 01/21/2019  Decreased Interest 1 0 0 0 0  Down, Depressed, Hopeless 0 0 0 0 0  PHQ - 2 Score 1 0 0 0 0  Altered sleeping 1 1 - 0 0  Tired, decreased energy 1 0 - 0 0  Change in appetite 0 1 - 0 0  Feeling bad or failure about yourself  0 0 - 0 0  Trouble concentrating 0 1 - 0 0  Moving slowly or fidgety/restless 0 1 - 0 0  Suicidal thoughts 0 0 - 0 0  PHQ-9 Score 3 4 - 0 0  Difficult doing work/chores Not difficult at all Somewhat difficult - Not difficult at all -  Some recent data might be hidden    phq 9 is positive    Fall Risk: Fall Risk  02/26/2020 10/28/2019 07/09/2019 04/22/2019 01/21/2019  Falls in the past year? 0 0 0 0 0  Number falls in past yr: 0 0 0 0 0  Comment - - - - -  Injury with Fall? 0 0 0 0 0  Risk for fall due to : - - - - -  Risk for fall due to: Comment - - - - -  Follow up - - Falls prevention discussed - -      Functional Status Survey: Is the patient deaf or have difficulty hearing?: No Does the patient have difficulty seeing, even when wearing glasses/contacts?: No Does the patient have difficulty concentrating, remembering, or making decisions?: Yes Does the patient have difficulty walking or climbing stairs?: No Does the patient have difficulty dressing or bathing?: No Does the patient have difficulty doing errands alone such as visiting a doctor's office or shopping?: Yes  Assessment & Plan  1. Type 2 diabetes mellitus with microalbuminuria, without long-term current use of insulin (HCC)  - POCT HgB A1C - empagliflozin (JARDIANCE) 25 MG TABS tablet; Take 25 mg by mouth daily.  Dispense: 90 tablet; Refill: 1 - SitaGLIPtin-MetFORMIN HCl (JANUMET XR) 50-1000 MG TB24; Take 2 tablets by mouth daily.  Dispense: 180 tablet; Refill: 1 -  glipiZIDE (GLUCOTROL XL) 2.5 MG 24 hr tablet; Take 1 tablet (2.5 mg total) by mouth daily with  breakfast.  Dispense: 90 tablet; Refill: 1  2. Benign essential HTN  - carvedilol (COREG) 12.5 MG tablet; Take 1 tablet (12.5 mg total) by mouth 2 (two) times daily with a meal.  Dispense: 180 tablet; Refill: 1  3. Dyslipidemia  - rosuvastatin (CRESTOR) 20 MG tablet; Take 1 tablet (20 mg total) by mouth daily.  Dispense: 90 tablet; Refill: 1 - icosapent Ethyl (VASCEPA) 1 g capsule; Take 1 capsule (1 g total) by mouth 2 (two) times daily.  Dispense: 180 capsule; Refill: 1  4. Dementia with behavioral disturbance, unspecified dementia type ([place])  Keep follow up with neurologist   5. Auditory hallucination  Stable   6. Controlled gout  - allopurinol (ZYLOPRIM) 100 MG tablet; Take 1 tablet (100 mg total) by mouth 2 (two) times daily.  Dispense: 180 tablet; Refill: 1  7. Stage 3a chronic kidney disease  - COMPLETE METABOLIC PANEL WITH GFR  8. Iron deficiency  - CBC with Differential/Platelet - Iron, TIBC and Ferritin Panel  9. Tachycardia  Heart rate was elevated twice during the visit, we are adjusting dose of carvedilol, we will check TSH and also advised daughter to check at home, when she is not nervous  - TSH - CBC with Differential/Platelet - Iron, TIBC and Ferritin Panel

## 2020-02-26 NOTE — Progress Notes (Signed)
poc

## 2020-02-27 LAB — COMPLETE METABOLIC PANEL WITH GFR
AG Ratio: 2 (calc) (ref 1.0–2.5)
ALT: 11 U/L (ref 6–29)
AST: 17 U/L (ref 10–35)
Albumin: 4.1 g/dL (ref 3.6–5.1)
Alkaline phosphatase (APISO): 107 U/L (ref 37–153)
BUN/Creatinine Ratio: 23 (calc) — ABNORMAL HIGH (ref 6–22)
BUN: 23 mg/dL (ref 7–25)
CO2: 29 mmol/L (ref 20–32)
Calcium: 10 mg/dL (ref 8.6–10.4)
Chloride: 102 mmol/L (ref 98–110)
Creat: 1.02 mg/dL — ABNORMAL HIGH (ref 0.60–0.93)
GFR, Est African American: 65 mL/min/{1.73_m2} (ref >=60)
GFR, Est Non African American: 56 mL/min/{1.73_m2} — ABNORMAL LOW (ref >=60)
Globulin: 2.1 g/dL (calc) (ref 1.9–3.7)
Glucose, Bld: 121 mg/dL — ABNORMAL HIGH (ref 65–99)
Potassium: 4.2 mmol/L (ref 3.5–5.3)
Sodium: 141 mmol/L (ref 135–146)
Total Bilirubin: 0.5 mg/dL (ref 0.2–1.2)
Total Protein: 6.2 g/dL (ref 6.1–8.1)

## 2020-02-27 LAB — IRON,TIBC AND FERRITIN PANEL
%SAT: 8 % (calc) — ABNORMAL LOW (ref 16–45)
Ferritin: 36 ng/mL (ref 16–288)
Iron: 30 ug/dL — ABNORMAL LOW (ref 45–160)
TIBC: 363 mcg/dL (calc) (ref 250–450)

## 2020-02-27 LAB — CBC WITH DIFFERENTIAL/PLATELET
Absolute Monocytes: 428 cells/uL (ref 200–950)
Basophils Absolute: 60 cells/uL (ref 0–200)
Basophils Relative: 0.8 %
Eosinophils Absolute: 83 cells/uL (ref 15–500)
Eosinophils Relative: 1.1 %
HCT: 33.5 % — ABNORMAL LOW (ref 35.0–45.0)
Hemoglobin: 11 g/dL — ABNORMAL LOW (ref 11.7–15.5)
Lymphs Abs: 2123 cells/uL (ref 850–3900)
MCH: 28.3 pg (ref 27.0–33.0)
MCHC: 32.8 g/dL (ref 32.0–36.0)
MCV: 86.1 fL (ref 80.0–100.0)
MPV: 12.3 fL (ref 7.5–12.5)
Monocytes Relative: 5.7 %
Neutro Abs: 4808 cells/uL (ref 1500–7800)
Neutrophils Relative %: 64.1 %
Platelets: 253 10*3/uL (ref 140–400)
RBC: 3.89 10*6/uL (ref 3.80–5.10)
RDW: 13.6 % (ref 11.0–15.0)
Total Lymphocyte: 28.3 %
WBC: 7.5 10*3/uL (ref 3.8–10.8)

## 2020-02-27 LAB — TSH: TSH: 1.16 mIU/L (ref 0.40–4.50)

## 2020-02-29 ENCOUNTER — Other Ambulatory Visit: Payer: Self-pay | Admitting: Family Medicine

## 2020-02-29 DIAGNOSIS — Z1231 Encounter for screening mammogram for malignant neoplasm of breast: Secondary | ICD-10-CM

## 2020-03-03 ENCOUNTER — Other Ambulatory Visit: Payer: Self-pay

## 2020-03-03 ENCOUNTER — Ambulatory Visit: Payer: Medicare Other | Admitting: Pharmacist

## 2020-03-03 DIAGNOSIS — E785 Hyperlipidemia, unspecified: Secondary | ICD-10-CM

## 2020-03-03 DIAGNOSIS — E1136 Type 2 diabetes mellitus with diabetic cataract: Secondary | ICD-10-CM
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
## 2020-03-04 ENCOUNTER — Ambulatory Visit
Admission: RE | Admit: 2020-03-04 | Discharge: 2020-03-04 | Disposition: A | Discharge status: Home / Self Care | Payer: Medicare Other | Source: Ambulatory Visit | Attending: Family Medicine | Admitting: Family Medicine

## 2020-03-04 DIAGNOSIS — Z1231 Encounter for screening mammogram for malignant neoplasm of breast: Secondary | ICD-10-CM | POA: Diagnosis not present

## 2020-03-06 NOTE — Patient Instructions (Addendum)
Visit Information  Goals Addressed            This Visit's Progress   . Chronic Care Management       CARE PLAN ENTRY  Current Barriers:  . Chronic Disease Management support, education, and care coordination needs related to Hypertension, Hyperlipidemia, Diabetes, and Dementia   Hypertension . Pharmacist Clinical Goal(s): o Over the next 90 days, patient will work with PharmD and providers to maintain BP goal <130/80 . Current regimen:  o Losartan 25mg , carvedilol 6.25mg  twice daily . Interventions: o Encourage home BP checks o Correct medication list . Patient self care activities - Over the next 90 days, daughter will: o Check BP daily, document, and provide at future appointments o Ensure daily salt intake < 2300 mg/day  Hyperlipidemia . Pharmacist Clinical Goal(s): o Over the next 90 days, patient will work with PharmD and providers to maintain LDL goal < 70 . Current regimen:  o Crestor 20mg  daily, Vascepa . Interventions: o Counseled on ways to raise HDL through lifestyle changes . Patient self care activities - Over the next 90 days, patient will: o Continue recently improved adherence strategy o Increase exercise for health and improved HDL (good cholesterol)  Diabetes . Pharmacist Clinical Goal(s): o Over the next 90 days, patient will work with PharmD and providers to achieve A1c goal <7% . Current regimen:  o Jardiance 25mg  daily, Janumet 50/1000mg  twice daily . Interventions: o None . Patient self care activities - Over the next 90 days, patient will: o Check blood sugar once daily, document, and provide at future appointments o Contact provider with any episodes of hypoglycemia o Continue improved adherence which should be reflected in next A1c  Dementia . Pharmacist Clinical Goal(s) o Over the next 90 days, patient will work with PharmD and providers to reduce symptoms . Current regimen:  o Aricept, Namenda, Seroquel . Interventions: o Counseled  on the difficult dopamine balance between Aricept and Seroquel o Increase Namenda to maintenance dose 10mg  twice daily . Patient self care activities - Over the next 90 days, patient and daughter will: o Ask neurology about Namenda dose increase  Medication management . Pharmacist Clinical Goal(s): o Over the next 90 days, patient will work with PharmD and providers to maintain optimal medication adherence . Current pharmacy: [facility] . Interventions o Comprehensive medication review performed. o Continue current medication management strategy . Patient self care activities - Over the next 90 days, patient/daughter will: o Focus on medication adherence by continuing with alarms and organizers o Take medications as prescribed o Report any questions or concerns to PharmD and/or provider(s)  Initial goal documentation        Lindsey [name] was given information about Chronic Care Management services today including:  1. CCM service includes personalized support from designated clinical staff supervised by her physician, including individualized plan of care and coordination with other care providers 2. 24/7 contact phone numbers for assistance for urgent and routine care needs. 3. Standard insurance, coinsurance, copays and deductibles apply for chronic care management only during months in which we provide at least 20 minutes of these services. Most insurances cover these services at 100%, however patients may be responsible for any copay, coinsurance and/or deductible if applicable. This service may help you avoid the need for more expensive face-to-face services. 4. Only one practitioner may furnish and bill the service in a calendar month. 5. The patient may stop CCM services at any time (effective at the end of the month)  by phone call to the office staff.  Patient agreed to services and verbal consent obtained.   Print copy of patient instructions provided.  Telephone follow up  appointment with pharmacy team member scheduled for: 3 months  Milus Height, PharmD, [place], [facility] [phone]  Dementia Caregiver Guide Dementia is a term used to describe a number of symptoms that affect memory and thinking. The most common symptoms include:  Memory loss.  Trouble with language and communication.  Trouble concentrating.  Poor judgment.  Problems with reasoning.  Child-like behavior and language.  Extreme anxiety.  Angry outbursts.  Wandering from home or public places. Dementia usually gets worse slowly over time. In the early stages, people with dementia can stay independent and safe with some help. In later stages, they need help with daily tasks such as dressing, grooming, and using the bathroom. How to help the person with dementia cope Dementia can be frightening and confusing. Here are some tips to help the person with dementia cope with changes caused by the disease. General tips  Keep the person on track with his or her routine.  Try to identify areas where the person may need help.  Be supportive, patient, calm, and encouraging.  Gently remind the person that adjusting to changes takes time.  Help with the tasks that the person has asked for help with.  Keep the person involved in daily tasks and decisions as much as possible.  Encourage conversation, but try not to get frustrated or harried if the person struggles to find words or does not seem to appreciate your help. Communication tips  When the person is talking or seems frustrated, make eye contact and hold the person's hand.  Ask specific questions that need yes or no answers.  Use simple words, short sentences, and a calm voice. Only give one direction at a time.  When offering choices, limit them to just 1 or 2.  Avoid correcting the person in a negative way.  If the person is struggling to find the right words, gently try to help  him or her. How to recognize symptoms of stress Symptoms of stress in caregivers include:  Feeling frustrated or angry with the person with dementia.  Denying that the person has dementia or that his or her symptoms will not improve.  Feeling hopeless and unappreciated.  Difficulty sleeping.  Difficulty concentrating.  Feeling anxious, irritable, or depressed.  Developing stress-related health problems.  Feeling like you have too little time for your own life. Follow these instructions at home:   Make sure that you and the person you are caring for: ? Get regular sleep. ? Exercise regularly. ? Eat regular, nutritious meals. ? Drink enough fluid to keep your urine clear or pale yellow. ? Take over-the-counter and prescription medicines only as told by your health care providers. ? Attend all scheduled health care appointments.  Join a support group with others who are caregivers.  Ask about respite care resources so that you can have a regular break from the stress of caregiving.  Look for signs of stress in yourself and in the person you are caring for. If you notice signs of stress, take steps to manage it.  Consider any safety risks and take steps to avoid them.  Organize medications in a pill box for each day of the week.  Create a plan to handle any legal or financial matters. Get legal or financial advice if needed.  Keep a calendar in  a central location to remind the person of appointments or other activities. Tips for reducing the risk of injury  Keep floors clear of clutter. Remove rugs, magazine racks, and floor lamps.  Keep hallways well lit, especially at night.  Put a handrail and nonslip mat in the bathtub or shower.  Put childproof locks on cabinets that contain dangerous items, such as medicines, alcohol, guns, toxic cleaning items, sharp tools or utensils, matches, and lighters.  Put the locks in places where the person cannot see or reach them  easily. This will help ensure that the person does not wander out of the house and get lost.  Be prepared for emergencies. Keep a list of emergency phone numbers and addresses in a convenient area.  Remove car keys and lock garage doors so that the person does not try to get in the car and drive.  Have the person wear a bracelet that tracks locations and identifies the person as having memory problems. This should be worn at all times for safety. Where to find support: Many individuals and organizations offer support. These include:  Support groups for people with dementia and for caregivers.  Counselors or therapists.  Home health care services.  Adult day care centers. Where to find more information Alzheimer's Association: [employer] Contact a health care provider if:  The person's health is rapidly getting worse.  You are no longer able to care for the person.  Caring for the person is affecting your physical and emotional health.  The person threatens himself or herself, you, or anyone else. Summary  Dementia is a term used to describe a number of symptoms that affect memory and thinking.  Dementia usually gets worse slowly over time.  Take steps to reduce the person's risk of injury, and to plan for future care.  Caregivers need support, relief from caregiving, and time for their own lives. This information is not intended to replace advice given to you by your health care provider. Make sure you discuss any questions you have with your health care provider. Document Revised: 09/13/2017 Document Reviewed: 09/04/2016 Elsevier Patient Education  2020 [employer].

## 2020-03-28 ENCOUNTER — Other Ambulatory Visit: Payer: Self-pay | Admitting: Family Medicine

## 2020-03-28 DIAGNOSIS — E1129 Type 2 diabetes mellitus with other diabetic kidney complication: Secondary | ICD-10-CM

## 2020-05-10 ENCOUNTER — Other Ambulatory Visit: Payer: Self-pay | Admitting: Family Medicine

## 2020-05-10 DIAGNOSIS — M8580 Other specified disorders of bone density and structure, unspecified site: Secondary | ICD-10-CM

## 2020-05-10 DIAGNOSIS — E785 Hyperlipidemia, unspecified: Secondary | ICD-10-CM

## 2020-05-10 NOTE — Telephone Encounter (Signed)
Requested Prescriptions  Pending Prescriptions Disp Refills   alendronate (FOSAMAX) 70 MG tablet [Pharmacy Med Name: Alendronate Sodium 70 MG Oral Tablet] 12 tablet 1    Sig: TAKE 1 TABLET BY MOUTH ONCE A WEEK WITH A FULL GLASS OF WATER ON AN EMPTY STOMACH.     Endocrinology:  Bisphosphonates Passed - 05/10/2020  4:22 PM      Passed - Ca in normal range and within 360 days    Calcium  Date Value Ref Range Status  02/26/2020 10.0 8.6 - 10.4 mg/dL Final         Passed - Vitamin D in normal range and within 360 days    Vit D, 25-Hydroxy  Date Value Ref Range Status  10/30/2019 61 30 - 100 ng/mL Final    Comment:    Vitamin D Status         25-OH Vitamin D: . Deficiency:                    <20 ng/mL Insufficiency:             20 - 29 ng/mL Optimal:                 > or = 30 ng/mL . For 25-OH Vitamin D testing on patients on  D2-supplementation and patients for whom quantitation  of D2 and D3 fractions is required, the QuestAssureD(TM) 25-OH VIT D, (D2,D3), LC/MS/MS is recommended: order  code [phone] (patients >69yrs). See Note 1 . Note 1 . For additional information, please refer to  http://education.QuestDiagnostics.com/faq/FAQ199  (This link is being provided for informational/ educational purposes only.)          Passed - Valid encounter within last 12 months    Recent Outpatient Visits          2 months ago Type 2 diabetes mellitus with microalbuminuria, without long-term current use of insulin [facility])   [facility] [place], Drue Stager, MD   6 months ago Type 2 diabetes mellitus with microalbuminuria, without long-term current use of insulin [facility])   [facility] [place], Drue Stager, MD   1 year ago Type 2 diabetes mellitus with microalbuminuria, without long-term current use of insulin [facility])   [facility] [place], Drue Stager, MD   1 year ago Type 2 diabetes mellitus with microalbuminuria, without long-term current use of  insulin [facility])   [facility] [place], Drue Stager, MD   1 year ago Type 2 diabetes mellitus with microalbuminuria, without long-term current use of insulin [facility])   [place], Astrid Divine, [place]      Future Appointments            In 2 months  [place]   In 3 months Steele Sizer, MD [facility], [facility]            rosuvastatin (CRESTOR) 20 MG tablet [Pharmacy Med Name: Rosuvastatin Calcium 20 MG Oral Tablet] 90 tablet 1    Sig: Take 1 tablet by mouth once daily     Cardiovascular:  Antilipid - Statins Failed - 05/10/2020  4:22 PM      Failed - HDL in normal range and within 360 days    HDL  Date Value Ref Range Status  10/30/2019 32 (L) > OR = 50 mg/dL Final  12/15/2015 47 >39 mg/dL Final         Failed - Triglycerides in normal range and within 360  days    Triglycerides  Date Value Ref Range Status  10/30/2019 182 (H) <150 mg/dL Final         Passed - Total Cholesterol in normal range and within 360 days    Cholesterol, Total  Date Value Ref Range Status  12/15/2015 135 100 - 199 mg/dL Final   Cholesterol  Date Value Ref Range Status  10/30/2019 102 <200 mg/dL Final         Passed - LDL in normal range and within 360 days    LDL Cholesterol (Calc)  Date Value Ref Range Status  10/30/2019 44 mg/dL (calc) Final    Comment:    Reference range: <100 . Desirable range <100 mg/dL for primary prevention;   <70 mg/dL for patients with CHD or diabetic patients  with > or = 2 CHD risk factors. Marland Kitchen LDL-C is now calculated using the Martin-Hopkins  calculation, which is a validated novel method providing  better accuracy than the Friedewald equation in the  estimation of LDL-C.  Cresenciano Genre et al. Annamaria Helling. 1027;253(66): 2061-2068  (http://education.QuestDiagnostics.com/faq/FAQ164)          Passed - Patient is not pregnant      Passed - Valid encounter within last 12 months    Recent  Outpatient Visits          2 months ago Type 2 diabetes mellitus with microalbuminuria, without long-term current use of insulin [facility])   [facility] [place], Drue Stager, MD   6 months ago Type 2 diabetes mellitus with microalbuminuria, without long-term current use of insulin [facility])   [facility] [place], Drue Stager, MD   1 year ago Type 2 diabetes mellitus with microalbuminuria, without long-term current use of insulin [facility])   [facility] [place], Drue Stager, MD   1 year ago Type 2 diabetes mellitus with microalbuminuria, without long-term current use of insulin [facility])   [facility] [place], Drue Stager, MD   1 year ago Type 2 diabetes mellitus with microalbuminuria, without long-term current use of insulin [facility])   [place], [place], [place]      Future Appointments            In 2 months  [facility], [place]   In 3 months Steele Sizer, MD [facility], [facility]

## 2020-06-01 ENCOUNTER — Other Ambulatory Visit: Payer: Self-pay

## 2020-06-01 ENCOUNTER — Ambulatory Visit (INDEPENDENT_AMBULATORY_CARE_PROVIDER_SITE_OTHER): Payer: Medicare Other | Admitting: Pharmacist

## 2020-06-01 DIAGNOSIS — R4189 Other symptoms and signs involving cognitive functions and awareness: Secondary | ICD-10-CM

## 2020-06-01 DIAGNOSIS — E1136 Type 2 diabetes mellitus with diabetic cataract: Secondary | ICD-10-CM

## 2020-06-01 NOTE — Chronic Care Management (AMB) (Signed)
Chronic Care Management Pharmacy  Name: Lindsey [name]  MRN: [id number] DOB: 1951/03/08  Chief Complaint/ HPI  Lindsey [name],  69 y.o. , female presents for their Follow-Up CCM visit with the clinical pharmacist via telephone due to COVID-19 Pandemic.  PCP : Lindsey Sizer, MD  Their chronic conditions include: DM, memory loss, HLD  Office Visits:NA  Consult Visit:NA  Medications: Outpatient Encounter Medications as of 06/01/2020  Medication Sig Note  . alendronate (FOSAMAX) 70 MG tablet TAKE 1 TABLET BY MOUTH ONCE A WEEK WITH A FULL GLASS OF WATER ON AN EMPTY STOMACH.   Marland Kitchen allopurinol (ZYLOPRIM) 100 MG tablet Take 1 tablet (100 mg total) by mouth 2 (two) times daily.   Marland Kitchen aspirin 81 MG chewable tablet Chew 1 tablet by mouth daily. 04/06/2015: DX: 401.1 Received from: [facility]:   . Black Cohosh 540 MG CAPS Take 1 tablet by mouth daily.   . carvedilol (COREG) 12.5 MG tablet Take 1 tablet (12.5 mg total) by mouth 2 (two) times daily with a meal.   . donepezil (ARICEPT) 10 MG tablet Take 1 tablet (10 mg total) by mouth at bedtime. (Patient taking differently: Take 10 mg by mouth daily. In the morning)   . empagliflozin (JARDIANCE) 25 MG TABS tablet Take 25 mg by mouth daily.   Marland Kitchen glipiZIDE (GLUCOTROL XL) 2.5 MG 24 hr tablet Take 1 tablet (2.5 mg total) by mouth daily with breakfast.   . GLUCOSE BLOOD VI  04/06/2015: DX: 250.00 Received from: [facility]  . icosapent Ethyl (VASCEPA) 1 g capsule Take 1 capsule (1 g total) by mouth 2 (two) times daily.   Marland Kitchen losartan (COZAAR) 25 MG tablet Take 1 tablet by mouth once daily   . Melatonin 5 MG CAPS Take 5 mg by mouth at bedtime.    . memantine (NAMENDA) 5 MG tablet Take 5 mg by mouth 2 (two) times daily.    . MULTIPLE VITAMIN PO Take 1 tablet by mouth daily. 04/06/2015: Received from: [facility]: Take by mouth.  . Multiple Vitamins-Minerals (SUPER VIKAPS) TABS Take  by mouth.   . QUEtiapine (SEROQUEL) 100 MG tablet takes 50 mg in the morning, 150 mg in the evening, and 100 mg at night.   . rosuvastatin (CRESTOR) 20 MG tablet Take 1 tablet by mouth once daily   . SitaGLIPtin-MetFORMIN HCl (JANUMET XR) 50-1000 MG TB24 Take 2 tablets by mouth daily.   Marland Kitchen VITAMIN D PO Take 1,000 mg by mouth daily.     No facility-administered encounter medications on file as of 06/01/2020.      Financial Resource Strain: Medium Risk  . Difficulty of Paying Living Expenses: Somewhat hard    Current Diagnosis/Assessment:  Goals Addressed            This Visit's Progress   . Chronic Care Management       CARE PLAN ENTRY  Current Barriers:  . Chronic Disease Management support, education, and care coordination needs related to Hypertension, Hyperlipidemia, Diabetes, and Dementia   Hypertension . Pharmacist Clinical Goal(s): o Over the next 90 days, patient will work with PharmD and providers to maintain BP goal <130/80 . Current regimen:  o Losartan 25mg , carvedilol 6.25mg  twice daily . Interventions: o Encourage home BP checks o Correct medication list . Patient self care activities - Over the next 90 days, daughter will: o Check BP daily, document, and provide at future appointments o Ensure daily salt intake <  2300 mg/day  Hyperlipidemia . Pharmacist Clinical Goal(s): o Over the next 90 days, patient will work with PharmD and providers to maintain LDL goal < 70 . Current regimen:  o Crestor 20mg  daily, Vascepa . Interventions: o Counseled on ways to raise HDL through lifestyle changes . Patient self care activities - Over the next 90 days, patient will: o Continue recently improved adherence strategy o Increase exercise for health and improved HDL (good cholesterol)  Diabetes . Pharmacist Clinical Goal(s): o Over the next 90 days, patient will work with PharmD and providers to achieve A1c goal <7% . Current regimen:  o Jardiance 25mg  daily,  Janumet 50/1000mg  twice daily . Interventions: o None . Patient self care activities - Over the next 90 days, patient will: o Check blood sugar once daily, document, and provide at future appointments o Contact provider with any episodes of hypoglycemia o Continue improved adherence which should be reflected in next A1c o Diet improvements since last call  Dementia . Pharmacist Clinical Goal(s) o Over the next 90 days, patient will work with PharmD and providers to reduce symptoms . Current regimen:  o Aricept, Namenda, Seroquel . Interventions: o Counseled on the difficult dopamine balance between Aricept and Seroquel o Increase Namenda to maintenance dose 10mg  twice daily . Patient self care activities - Over the next 90 days, patient and daughter will: o Ask neurology about Namenda dose increase  Medication management . Pharmacist Clinical Goal(s): o Over the next 90 days, patient will work with PharmD and providers to maintain optimal medication adherence . Current pharmacy: [facility] . Interventions o Comprehensive medication review performed. o Continue current medication management strategy . Patient self care activities - Over the next 90 days, patient/daughter will: o Focus on medication adherence by continuing with alarms and organizers o Take medications as prescribed o Report any questions or concerns to PharmD and/or provider(s)  Initial goal documentation       Diabetes   Recent Relevant Labs: Lab Results  Component Value Date/Time   HGBA1C 7.0 (A) 02/26/2020 03:50 PM   HGBA1C 6.9 (H) 10/30/2019 09:24 AM   HGBA1C 7.4 (A) 04/22/2019 04:03 PM   HGBA1C 6.7 (H) 10/14/2018 10:01 AM   HGBA1C 6.9 07/04/2018 03:59 PM   MICROALBUR 1.7 10/30/2019 09:24 AM   MICROALBUR 1.3 10/14/2018 10:01 AM   MICROALBUR 20 08/02/2017 02:50 PM   MICROALBUR NEGATIVE 04/06/2016 08:24 AM    Patient is currently controlled on the following medications: Jardiance, Janumet  Last  diabetic Foot exam:  Lab Results  Component Value Date/Time   HMDIABEYEEXA No Retinopathy 02/05/2020 12:00 AM    Last diabetic Eye exam: No results found for: HMDIABFOOTEX   We discussed:  Borderline at goal Diet improvements already implemented but not reflected in A1c  Plan  Continue current medications  Depression   Patient has failed these meds in past: NA Patient is currently controlled on the following medications:  . Seroquel  We discussed:   Increased Namenda? No, 5mg  twice daily Problem with untaken medication? No Losing weight, doesn't eat lunch  Plan  Continue current medications  Medication Management   Uses pill box? Yes Pt endorses 100% compliance  We discussed:  Daughter helping  Plan  Continue current medication management strategy  Follow up: 3 month phone visit  Milus Height, PharmD, [place], [facility] [phone]

## 2020-06-06 NOTE — Patient Instructions (Addendum)
Visit Information  Goals Addressed            This Visit's Progress   . Chronic Care Management       CARE PLAN ENTRY  Current Barriers:  . Chronic Disease Management support, education, and care coordination needs related to Hypertension, Hyperlipidemia, Diabetes, and Dementia   Hypertension . Pharmacist Clinical Goal(s): o Over the next 90 days, patient will work with PharmD and providers to maintain BP goal <130/80 . Current regimen:  o Losartan 25mg , carvedilol 6.25mg  twice daily . Interventions: o Encourage home BP checks o Correct medication list . Patient self care activities - Over the next 90 days, daughter will: o Check BP daily, document, and provide at future appointments o Ensure daily salt intake < 2300 mg/day  Hyperlipidemia . Pharmacist Clinical Goal(s): o Over the next 90 days, patient will work with PharmD and providers to maintain LDL goal < 70 . Current regimen:  o Crestor 20mg  daily, Vascepa . Interventions: o Counseled on ways to raise HDL through lifestyle changes . Patient self care activities - Over the next 90 days, patient will: o Continue recently improved adherence strategy o Increase exercise for health and improved HDL (good cholesterol)  Diabetes . Pharmacist Clinical Goal(s): o Over the next 90 days, patient will work with PharmD and providers to achieve A1c goal <7% . Current regimen:  o Jardiance 25mg  daily, Janumet 50/1000mg  twice daily . Interventions: o None . Patient self care activities - Over the next 90 days, patient will: o Check blood sugar once daily, document, and provide at future appointments o Contact provider with any episodes of hypoglycemia o Continue improved adherence which should be reflected in next A1c o Diet improvements since last call  Dementia . Pharmacist Clinical Goal(s) o Over the next 90 days, patient will work with PharmD and providers to reduce symptoms . Current regimen:  o Aricept, Namenda,  Seroquel . Interventions: o Counseled on the difficult dopamine balance between Aricept and Seroquel o Increase Namenda to maintenance dose 10mg  twice daily . Patient self care activities - Over the next 90 days, patient and daughter will: o Ask neurology about Namenda dose increase  Medication management . Pharmacist Clinical Goal(s): o Over the next 90 days, patient will work with PharmD and providers to maintain optimal medication adherence . Current pharmacy: [facility] . Interventions o Comprehensive medication review performed. o Continue current medication management strategy . Patient self care activities - Over the next 90 days, patient/daughter will: o Focus on medication adherence by continuing with alarms and organizers o Take medications as prescribed o Report any questions or concerns to PharmD and/or provider(s)  Initial goal documentation        Print copy of patient instructions provided.   Telephone follow up appointment with pharmacy team member scheduled for:  Milus Height, PharmD, BCGP, [facility] [phone]  Dementia Dementia is a condition that affects the way the brain works. It often affects memory and thinking. There are many types of dementia. Some types get worse with time and cannot be reversed. Some types of dementia include:  Alzheimer's disease. This is the most common type.  Vascular dementia. This type may happen due to a stroke.  Lewy body dementia. This type may happen to people who have Parkinson's disease.  Frontotemporal dementia. This type is caused by damage to nerve cells in certain parts of the brain. Some people may have more than one type, and this is called mixed  dementia. What are the causes? This condition is caused by damage to cells in the brain. Some causes that cannot be reversed include:  Having a condition that affects the blood vessels of the brain, such as diabetes, heart  disease, or blood vessel disease.  Changes to genes. Some causes that can be reversed or slowed include:  Injury to the brain.  Certain medicines.  Infection.  Not having enough vitamin B12 in the body, or thyroid problems.  A tumor or blood clot in the brain. What are the signs or symptoms? Symptoms depend on the type of dementia. This may include:  Problems remembering things.  Having trouble taking a bath or putting clothes on.  Forgetting appointments.  Forgetting to pay bills.  Trouble planning and making meals.  Having trouble speaking.  Getting lost easily. How is this treated? Treatment depends on the cause of the dementia. It might include taking medicines that help:  To control the dementia.  To slow down the dementia.  To manage symptoms. In some cases, treating the cause of your dementia can improve symptoms, reverse symptoms, or slow down how quickly it gets worse. Your doctor can help you find support groups and other doctors who can help with your care. Follow these instructions at home: Medicines  Take over-the-counter and prescription medicines only as told by your doctor.  Use a pill organizer to help you manage your medicines.  Avoidtaking medicines for pain or for sleep. Lifestyle  Make healthy choices: ? Be active as told by your doctor. ? Do not use any products that contain nicotine or tobacco, such as cigarettes, e-cigarettes, and chewing tobacco. If you need help quitting, ask your doctor. ? Do not drink alcohol. ? When you get stressed, do something that will help you to relax. Your doctor can give you tips. ? Spend time with other people.  Make sure you get good sleep. To get good sleep: ? Try not to take naps during the day. ? Keep your bedroom dark and cool. ? In the few hours before you go to bed, try not to do any exercise. ? Do not have foods and drinks with caffeine at night. Eating and drinking  Drink enough fluid to  keep your pee (urine) pale yellow.  Eat a healthy diet. General instructions   Talk with your doctor to figure out: ? What you need help with. ? What your safety needs are.  Ask your doctor if it is safe for you to drive.  If told, wear a bracelet that tracks where you are or shows that you are a person with memory loss.  Work with your family to make big decisions.  Keep all follow-up visits as told by your doctor. This is important. Contact a doctor if:  You have any new symptoms.  Your symptoms get worse.  You have problems with swallowing or choking. Get help right away if:  You feel very sad, or feel that you want to harm yourself.  You or your family members are worried for your safety. If you ever feel like you may hurt yourself or others, or have thoughts about taking your own life, get help right away. You can go to your nearest emergency department or call:  Your local emergency services (911 in the [place]  A suicide crisis helpline, such as the [facility] at 539-786-3845. This is open 24 hours a day. Summary  Dementia often affects memory and thinking.  Some types of dementia  get worse with time and cannot be reversed.  Treatment for this condition depends on the cause.  Talk with your doctor to figure out what you need help with.  Your doctor can help you find support groups and other doctors who can help with your care. This information is not intended to replace advice given to you by your health care provider. Make sure you discuss any questions you have with your health care provider. Document Revised: 12/16/2018 Document Reviewed: 12/16/2018 Elsevier Patient Education  [employer].

## 2020-06-15 ENCOUNTER — Other Ambulatory Visit: Payer: Self-pay | Admitting: Family Medicine

## 2020-06-15 DIAGNOSIS — I10 Essential (primary) hypertension: Secondary | ICD-10-CM

## 2020-06-15 DIAGNOSIS — E1129 Type 2 diabetes mellitus with other diabetic kidney complication: Secondary | ICD-10-CM

## 2020-06-15 DIAGNOSIS — F0391 Unspecified dementia with behavioral disturbance: Secondary | ICD-10-CM

## 2020-07-12 ENCOUNTER — Ambulatory Visit (INDEPENDENT_AMBULATORY_CARE_PROVIDER_SITE_OTHER): Payer: Medicare Other

## 2020-07-12 DIAGNOSIS — Z0000 Encounter for general adult medical examination without abnormal findings: Secondary | ICD-10-CM

## 2020-07-12 NOTE — Progress Notes (Addendum)
Subjective:   Lindsey [name] is a 69 y.o. female who presents for Medicare Annual (Subsequent) preventive examination.  Virtual Visit via Telephone Note  I connected with  Lindsey [name] on 07/12/20 at  3:30 PM EDT by telephone and verified that I am speaking with the correct person using two identifiers.  Medicare Annual Wellness visit completed telephonically due to Covid-19 pandemic.   Location: Patient: home Provider: [place]   I discussed the limitations, risks, security and privacy concerns of performing an evaluation and management service by telephone and the availability of in person appointments. The patient expressed understanding and agreed to proceed.  Unable to perform video visit due to video visit attempted and failed and/or patient does not have video capability.   Some vital signs may be absent or patient reported.   Clemetine Marker, LPN    Review of Systems     Cardiac Risk Factors include: advanced age (>76men, >8 women);diabetes mellitus;dyslipidemia;hypertension;obesity (BMI >30kg/m2)     Objective:    There were no vitals filed for this visit. There is no height or weight on file to calculate BMI.  Advanced Directives 07/12/2020 11/05/2019 07/09/2019 07/04/2018 08/02/2017 04/01/2017 01/20/2017  Does Patient Have a Medical Advance Directive? No No No No No No No  Type of Advance Directive - - - - - - -  Does patient want to make changes to medical advance directive? - - - - - - -  Would patient like information on creating a medical advance directive? No - Patient declined - No - Patient declined Yes (MAU/Ambulatory/Procedural Areas - Information given) - No - Patient declined No - Patient declined    Current Medications (verified) Outpatient Encounter Medications as of 07/12/2020  Medication Sig  . alendronate (FOSAMAX) 70 MG tablet TAKE 1 TABLET BY MOUTH ONCE A WEEK WITH A FULL GLASS OF WATER ON AN EMPTY STOMACH.  Marland Kitchen allopurinol (ZYLOPRIM) 100 MG tablet Take 1  tablet (100 mg total) by mouth 2 (two) times daily.  Marland Kitchen aspirin 81 MG chewable tablet Chew 1 tablet by mouth daily.  . carvedilol (COREG) 12.5 MG tablet Take 1 tablet (12.5 mg total) by mouth 2 (two) times daily with a meal.  . donepezil (ARICEPT) 10 MG tablet Take 1 tablet (10 mg total) by mouth at bedtime. (Patient taking differently: Take 10 mg by mouth daily. In the morning)  . empagliflozin (JARDIANCE) 25 MG TABS tablet Take 25 mg by mouth daily.  Marland Kitchen glipiZIDE (GLUCOTROL XL) 2.5 MG 24 hr tablet Take 1 tablet (2.5 mg total) by mouth daily with breakfast.  . GLUCOSE BLOOD VI   . icosapent Ethyl (VASCEPA) 1 g capsule Take 1 capsule (1 g total) by mouth 2 (two) times daily.  Marland Kitchen losartan (COZAAR) 25 MG tablet Take 1 tablet by mouth once daily  . Melatonin 5 MG CAPS Take 5 mg by mouth at bedtime.   . memantine (NAMENDA) 10 MG tablet Take 10 mg by mouth 2 (two) times daily.  . MULTIPLE VITAMIN PO Take 1 tablet by mouth daily.  . QUEtiapine (SEROQUEL) 100 MG tablet takes 50 mg in the morning, 150 mg in the evening, and 100 mg at night.  . rosuvastatin (CRESTOR) 20 MG tablet Take 1 tablet by mouth once daily  . SitaGLIPtin-MetFORMIN HCl (JANUMET XR) 50-1000 MG TB24 Take 2 tablets by mouth daily.  Marland Kitchen VITAMIN D PO Take 1,000 mg by mouth daily.   . [DISCONTINUED] Black Cohosh 540 MG CAPS Take 1 tablet by  mouth daily.  . [DISCONTINUED] memantine (NAMENDA) 5 MG tablet Take 5 mg by mouth 2 (two) times daily.   . [DISCONTINUED] Multiple Vitamins-Minerals (SUPER VIKAPS) TABS Take by mouth.   No facility-administered encounter medications on file as of 07/12/2020.    Allergies (verified) Ace inhibitors   History: Past Medical History:  Diagnosis Date  . Acute gout   . Allergy ace inhibitors  . Anemia of chronic disease   . Anxiety   . Basal cell carcinoma   . Cataract   . Depression, major, recurrent, mild ([place])   . Diabetes mellitus without complication ([place])   . Fibromyalgia syndrome   .  Hyperlipidemia   . Hypertension   . Osteoporosis   . Retinopathy    Past Surgical History:  Procedure Laterality Date  . CESAREAN SECTION    . TONSILECTOMY, ADENOIDECTOMY, BILATERAL MYRINGOTOMY AND TUBES    . TUBAL LIGATION     Family History  Problem Relation Age of Onset  . Hypertension Mother   . Osteoporosis Mother   . Cancer Mother        brain tumor  . Hodgkin's lymphoma Father   . Hypertension Sister   . Cancer Sister        unknown  . Hypertension Sister   . Diabetes Sister   . Cancer Sister 35       breast  . Breast cancer Sister 2  . Cancer Maternal Uncle    Social History   Socioeconomic History  . Marital status: Divorced    Spouse name: Not on file  . Number of children: 3  . Years of education: Not on file  . Highest education level: GED or equivalent  Occupational History    Employer: RETIRED  Tobacco Use  . Smoking status: Never Smoker  . Smokeless tobacco: Never Used  . Tobacco comment: smoking cessation materials not required  Vaping Use  . Vaping Use: Never used  Substance and Sexual Activity  . Alcohol use: No    Alcohol/week: 0.0 standard drinks  . Drug use: No  . Sexual activity: Not Currently  Other Topics Concern  . Not on file  Social History Narrative   Pt lives alone, has aide that comes in the morning 5 days per week. Daughter [name] helps with meds and finances.    Social Determinants of Health   Financial Resource Strain: Medium Risk  . Difficulty of Paying Living Expenses: Somewhat hard  Food Insecurity: No Food Insecurity  . Worried About Charity fundraiser in the Last Year: Never true  . Ran Out of Food in the Last Year: Never true  Transportation Needs: No Transportation Needs  . Lack of Transportation (Medical): No  . Lack of Transportation (Non-Medical): No  Physical Activity: Insufficiently Active  . Days of Exercise per Week: 5 days  . Minutes of Exercise per Session: 10 min  Stress: No Stress Concern Present   . Feeling of Stress : Not at all  Social Connections: Moderately Isolated  . Frequency of Communication with Friends and Family: More than three times a week  . Frequency of Social Gatherings with Friends and Family: More than three times a week  . Attends Religious Services: More than 4 times per year  . Active Member of Clubs or Organizations: No  . Attends Archivist Meetings: Never  . Marital Status: Divorced    Tobacco Counseling Counseling given: Not Answered Comment: smoking cessation materials not required   Clinical Intake:  Pre-visit  preparation completed: Yes  Pain : No/denies pain     Nutritional Risks: None Diabetes: Yes CBG done?: No Did pt. bring in CBG monitor from home?: No  How often do you need to have someone help you when you read instructions, pamphlets, or other written materials from your doctor or pharmacy?: 1 - Never  Nutrition Risk Assessment:  Has the patient had any N/V/D within the last 2 months?  No  Does the patient have any non-healing wounds?  No  Has the patient had any unintentional weight loss or weight gain?  No   Diabetes:  Is the patient diabetic?  Yes  If diabetic, was a CBG obtained today?  No  Did the patient bring in their glucometer from home?  No  How often do you monitor your CBG's? Pt does not actively check blood sugar.   Financial Strains and Diabetes Management:  Are you having any financial strains with the device, your supplies or your medication? No .  Does the patient want to be seen by Chronic Care Management for management of their diabetes?  No  Would the patient like to be referred to a Nutritionist or for Diabetic Management?  No   Diabetic Exams:  Diabetic Eye Exam: Completed 02/05/20 negative reitnopathy.   Diabetic Foot Exam: Completed 08/03/19.   Interpreter Needed?: No  Information entered by :: Clemetine Marker LPN   Activities of Daily Living In your present state of health, do you  have any difficulty performing the following activities: 07/12/2020 02/26/2020  Hearing? N N  Comment declines hearing aids -  Vision? N N  Difficulty concentrating or making decisions? [name]  Walking or climbing stairs? N N  Dressing or bathing? N N  Doing errands, shopping? [name]  Preparing Food and eating ? N -  Using the Toilet? N -  In the past six months, have you accidently leaked urine? N -  Do you have problems with loss of bowel control? Y -  Managing your Medications? N -  Managing your Finances? N -  Housekeeping or managing your Housekeeping? N -  Some recent data might be hidden    Patient Care Team: Steele Sizer, MD as PCP - General (Family Medicine) Mendel Corning, MD as Consulting Physician (Psychiatry) Anabel Bene, MD as Consulting Physician (Neurology) Verdia Kuba, [facility] (Pharmacist)  Indicate any recent Medical Services you may have received from other than [facility] providers in the past year (date may be approximate).     Assessment:   This is a routine wellness examination for [place].  Hearing/Vision screen  Hearing Screening   125Hz  250Hz  500Hz  1000Hz  2000Hz  3000Hz  4000Hz  6000Hz  8000Hz   Right ear:           Left ear:           Comments: Pt denies hearing difficulty  Vision Screening Comments: [facility]  Dietary issues and exercise activities discussed: Current Exercise Habits: Home exercise routine, Type of exercise: walking, Time (Minutes): 10, Frequency (Times/Week): 5, Weekly Exercise (Minutes/Week): 50, Intensity: Mild, Exercise limited by: None identified  Goals    . Chronic Care Management     CARE PLAN ENTRY  Current Barriers:  . Chronic Disease Management support, education, and care coordination needs related to Hypertension, Hyperlipidemia, Diabetes, and Dementia   Hypertension . Pharmacist Clinical Goal(s): o Over the next 90 days, patient will work with PharmD and providers to maintain BP goal  <130/80 . Current  regimen:  o Losartan 25mg , carvedilol 6.25mg  twice daily . Interventions: o Encourage home BP checks o Correct medication list . Patient self care activities - Over the next 90 days, daughter will: o Check BP daily, document, and provide at future appointments o Ensure daily salt intake < 2300 mg/day  Hyperlipidemia . Pharmacist Clinical Goal(s): o Over the next 90 days, patient will work with PharmD and providers to maintain LDL goal < 70 . Current regimen:  o Crestor 20mg  daily, Vascepa . Interventions: o Counseled on ways to raise HDL through lifestyle changes . Patient self care activities - Over the next 90 days, patient will: o Continue recently improved adherence strategy o Increase exercise for health and improved HDL (good cholesterol)  Diabetes . Pharmacist Clinical Goal(s): o Over the next 90 days, patient will work with PharmD and providers to achieve A1c goal <7% . Current regimen:  o Jardiance 25mg  daily, Janumet 50/1000mg  twice daily . Interventions: o None . Patient self care activities - Over the next 90 days, patient will: o Check blood sugar once daily, document, and provide at future appointments o Contact provider with any episodes of hypoglycemia o Continue improved adherence which should be reflected in next A1c o Diet improvements since last call  Dementia . Pharmacist Clinical Goal(s) o Over the next 90 days, patient will work with PharmD and providers to reduce symptoms . Current regimen:  o Aricept, Namenda, Seroquel . Interventions: o Counseled on the difficult dopamine balance between Aricept and Seroquel o Increase Namenda to maintenance dose 10mg  twice daily . Patient self care activities - Over the next 90 days, patient and daughter will: o Ask neurology about Namenda dose increase  Medication management . Pharmacist Clinical Goal(s): o Over the next 90 days, patient will work with PharmD and providers to maintain  optimal medication adherence . Current pharmacy: [facility] . Interventions o Comprehensive medication review performed. o Continue current medication management strategy . Patient self care activities - Over the next 90 days, patient/daughter will: o Focus on medication adherence by continuing with alarms and organizers o Take medications as prescribed o Report any questions or concerns to PharmD and/or provider(s)  Initial goal documentation     . Exercise 150 min/wk Moderate Activity     Recommend to exercise for at least 150 minutes per week.    . Increase water intake     Recommend increasing water intake to 6 glasses a day.       Depression Screen PHQ 2/9 Scores 07/12/2020 02/26/2020 10/28/2019 07/09/2019 04/22/2019 01/21/2019 10/14/2018  PHQ - 2 Score 0 1 0 0 0 0 1  PHQ- 9 Score - 3 4 - 0 0 4    Fall Risk Fall Risk  07/12/2020 02/26/2020 10/28/2019 07/09/2019 04/22/2019  Falls in the past year? 0 0 0 0 0  Number falls in past yr: 0 0 0 0 0  Comment - - - - -  Injury with Fall? 0 0 0 0 0  Risk for fall due to : No Fall Risks - - - -  Risk for fall due to: Comment - - - - -  Follow up Falls prevention discussed - - Falls prevention discussed -    Any stairs in or around the home? Yes  If so, are there any without handrails? No  Home free of loose throw rugs in walkways, pet beds, electrical cords, etc? Yes  Adequate lighting in your home to reduce risk of falls? Yes   ASSISTIVE  DEVICES UTILIZED TO PREVENT FALLS:  Life alert? No  Use of a cane, walker or w/c? No  Grab bars in the bathroom? Yes  Shower chair or bench in shower? Yes  Elevated toilet seat or a handicapped toilet? No   TIMED UP AND GO:  Was the test performed? No . Telephonic visit.   Cognitive Function: 6CIT deferred for 2021 AWV; pt followed by neurology     6CIT Screen 07/04/2018 04/01/2017  What Year? 4 points 0 points  What month? 0 points 0 points  What time? 3 points 0 points  Count back from 20 0  points 0 points  Months in reverse 4 points 2 points  Repeat phrase 10 points 10 points  Total Score 21 12    Immunizations Immunization History  Administered Date(s) Administered  . Fluad Quad(high Dose 65+) 07/09/2019  . Influenza Split 03/06/2007  . Influenza, High Dose Seasonal PF 07/01/2015, 08/10/2015, 08/06/2016, 08/02/2017, 07/04/2018  . Influenza-Unspecified 11/19/2014  . PFIZER SARS-COV-2 Vaccination 12/11/2019, 01/01/2020  . Pneumococcal Conjugate-13 04/06/2016  . Pneumococcal Polysaccharide-23 09/03/2006, 11/19/2014  . Tdap 11/19/2014  . Zoster 11/19/2014    TDAP status: Up to date   Flu vaccine status: due for 2021-2022 season  Pneumococcal vaccine status: Up to date   Covid-19 vaccine status: Completed vaccines  Qualifies for Shingles Vaccine? Yes   Zostavax completed Yes   Shingrix Completed?: No.    Education has been provided regarding the importance of this vaccine. Patient has been advised to call insurance company to determine out of pocket expense if they have not yet received this vaccine. Advised may also receive vaccine at local pharmacy or Health Dept. Verbalized acceptance and understanding.  Screening Tests Health Maintenance  Topic Date Due  . INFLUENZA VACCINE  05/15/2020  . FOOT EXAM  08/02/2020  . HEMOGLOBIN A1C  08/28/2020  . OPHTHALMOLOGY EXAM  02/04/2021  . MAMMOGRAM  03/04/2021  . COLONOSCOPY  04/25/2023  . TETANUS/TDAP  11/19/2024  . DEXA SCAN  Completed  . COVID-19 Vaccine  Completed  . Hepatitis C Screening  Completed  . PNA vac Low Risk Adult  Completed    Health Maintenance  Health Maintenance Due  Topic Date Due  . INFLUENZA VACCINE  05/15/2020    Colorectal cancer screening: Completed 04/24/13. Repeat every 10 years   Mammogram status: Completed 03/04/20. Repeat every year   Bone Density status: Completed 11/25/18. Results reflect: Bone density results: OSTEOPENIA. Repeat every 2 years.  Lung Cancer Screening: (Low  Dose CT Chest recommended if Age 31-80 years, 30 pack-year currently smoking OR have quit w/in 15years.) does not qualify.   Additional Screening:  Hepatitis C Screening: does qualify; Completed 12/16/15  Vision Screening: Recommended annual ophthalmology exams for early detection of glaucoma and other disorders of the eye. Is the patient up to date with their annual eye exam?  Yes  Who is the provider or what is the name of the office in which the patient attends annual eye exams? [place] Screening: Recommended annual dental exams for proper oral hygiene  Community Resource Referral / Chronic Care Management: CRR required this visit?  No   CCM required this visit?  No      Plan:     I have personally reviewed and noted the following in the patient's chart:   . Medical and social history . Use of alcohol, tobacco or illicit drugs  . Current medications and supplements . Functional ability and status . Nutritional status .  Physical activity . Advanced directives . List of other physicians . Hospitalizations, surgeries, and ER visits in previous 12 months . Vitals . Screenings to include cognitive, depression, and falls . Referrals and appointments  In addition, I have reviewed and discussed with patient certain preventive protocols, quality metrics, and best practice recommendations. A written personalized care plan for preventive services as well as general preventive health recommendations were provided to patient.     Clemetine Marker, LPN   [id number]   Nurse Notes: pt accompanied by daughter [name] during telephonic visit; doing well and appreciative of visit today

## 2020-07-12 NOTE — Patient Instructions (Signed)
Lindsey [name] , Thank you for taking time to come for your Medicare Wellness Visit. I appreciate your ongoing commitment to your health goals. Please review the following plan we discussed and let me know if I can assist you in the future.   Screening recommendations/referrals: Colonoscopy: done 04/24/13. Due 2024 Mammogram: done 03/04/20 Bone Density: done 11/25/18 Recommended yearly ophthalmology/optometry visit for glaucoma screening and checkup Recommended yearly dental visit for hygiene and checkup  Vaccinations: Influenza vaccine: due Pneumococcal vaccine: done 04/06/16 Tdap vaccine: done 11/19/14 Shingles vaccine: Shingrix discussed. Please contact your pharmacy for coverage information.  Covid-19: done 12/11/19 7 01/01/20  Advanced directives: Please bring a copy of your health care power of attorney and living will to the office at your convenience once you have completed those documents.   Conditions/risks identified: recommend increasing physical activity to 150 minutes per week  Next appointment: Follow up in one year for your annual wellness visit    Preventive Care 65 Years and Older, Female Preventive care refers to lifestyle choices and visits with your health care provider that can promote health and wellness. What does preventive care include?  A yearly physical exam. This is also called an annual well check.  Dental exams once or twice a year.  Routine eye exams. Ask your health care provider how often you should have your eyes checked.  Personal lifestyle choices, including:  Daily care of your teeth and gums.  Regular physical activity.  Eating a healthy diet.  Avoiding tobacco and drug use.  Limiting alcohol use.  Practicing safe sex.  Taking low-dose aspirin every day.  Taking vitamin and mineral supplements as recommended by your health care provider. What happens during an annual well check? The services and screenings done by your health care provider  during your annual well check will depend on your age, overall health, lifestyle risk factors, and family history of disease. Counseling  Your health care provider may ask you questions about your:  Alcohol use.  Tobacco use.  Drug use.  Emotional well-being.  Home and relationship well-being.  Sexual activity.  Eating habits.  History of falls.  Memory and ability to understand (cognition).  Work and work Statistician.  Reproductive health. Screening  You may have the following tests or measurements:  Height, weight, and BMI.  Blood pressure.  Lipid and cholesterol levels. These may be checked every 5 years, or more frequently if you are over 32 years old.  Skin check.  Lung cancer screening. You may have this screening every year starting at age 71 if you have a 30-pack-year history of smoking and currently smoke or have quit within the past 15 years.  Fecal occult blood test (FOBT) of the stool. You may have this test every year starting at age 71.  Flexible sigmoidoscopy or colonoscopy. You may have a sigmoidoscopy every 5 years or a colonoscopy every 10 years starting at age 71.  Hepatitis C blood test.  Hepatitis B blood test.  Sexually transmitted disease (STD) testing.  Diabetes screening. This is done by checking your blood sugar (glucose) after you have not eaten for a while (fasting). You may have this done every 1-3 years.  Bone density scan. This is done to screen for osteoporosis. You may have this done starting at age 71.  Mammogram. This may be done every 1-2 years. Talk to your health care provider about how often you should have regular mammograms. Talk with your health care provider about your test results, treatment options,  and if necessary, the need for more tests. Vaccines  Your health care provider may recommend certain vaccines, such as:  Influenza vaccine. This is recommended every year.  Tetanus, diphtheria, and acellular pertussis  (Tdap, Td) vaccine. You may need a Td booster every 10 years.  Zoster vaccine. You may need this after age 71.  Pneumococcal 13-valent conjugate (PCV13) vaccine. One dose is recommended after age 3.  Pneumococcal polysaccharide (PPSV23) vaccine. One dose is recommended after age 39. Talk to your health care provider about which screenings and vaccines you need and how often you need them. This information is not intended to replace advice given to you by your health care provider. Make sure you discuss any questions you have with your health care provider. Document Released: 10/28/2015 Document Revised: 06/20/2016 Document Reviewed: 08/02/2015 Elsevier Interactive Patient Education  2017 [place] Prevention in the Home Falls can cause injuries. They can happen to people of all ages. There are many things you can do to make your home safe and to help prevent falls. What can I do on the outside of my home?  Regularly fix the edges of walkways and driveways and fix any cracks.  Remove anything that might make you trip as you walk through a door, such as a raised step or threshold.  Trim any bushes or trees on the path to your home.  Use bright outdoor lighting.  Clear any walking paths of anything that might make someone trip, such as rocks or tools.  Regularly check to see if handrails are loose or broken. Make sure that both sides of any steps have handrails.  Any raised decks and porches should have guardrails on the edges.  Have any leaves, snow, or ice cleared regularly.  Use sand or salt on walking paths during winter.  Clean up any spills in your garage right away. This includes oil or grease spills. What can I do in the bathroom?  Use night lights.  Install grab bars by the toilet and in the tub and shower. Do not use towel bars as grab bars.  Use non-skid mats or decals in the tub or shower.  If you need to sit down in the shower, use a plastic, non-slip  stool.  Keep the floor dry. Clean up any water that spills on the floor as soon as it happens.  Remove soap buildup in the tub or shower regularly.  Attach bath mats securely with double-sided non-slip rug tape.  Do not have throw rugs and other things on the floor that can make you trip. What can I do in the bedroom?  Use night lights.  Make sure that you have a light by your bed that is easy to reach.  Do not use any sheets or blankets that are too big for your bed. They should not hang down onto the floor.  Have a firm chair that has side arms. You can use this for support while you get dressed.  Do not have throw rugs and other things on the floor that can make you trip. What can I do in the kitchen?  Clean up any spills right away.  Avoid walking on wet floors.  Keep items that you use a lot in easy-to-reach places.  If you need to reach something above you, use a strong step stool that has a grab bar.  Keep electrical cords out of the way.  Do not use floor polish or wax that makes floors slippery. If  you must use wax, use non-skid floor wax.  Do not have throw rugs and other things on the floor that can make you trip. What can I do with my stairs?  Do not leave any items on the stairs.  Make sure that there are handrails on both sides of the stairs and use them. Fix handrails that are broken or loose. Make sure that handrails are as long as the stairways.  Check any carpeting to make sure that it is firmly attached to the stairs. Fix any carpet that is loose or worn.  Avoid having throw rugs at the top or bottom of the stairs. If you do have throw rugs, attach them to the floor with carpet tape.  Make sure that you have a light switch at the top of the stairs and the bottom of the stairs. If you do not have them, ask someone to add them for you. What else can I do to help prevent falls?  Wear shoes that:  Do not have high heels.  Have rubber bottoms.  Are  comfortable and fit you well.  Are closed at the toe. Do not wear sandals.  If you use a stepladder:  Make sure that it is fully opened. Do not climb a closed stepladder.  Make sure that both sides of the stepladder are locked into place.  Ask someone to hold it for you, if possible.  Clearly mark and make sure that you can see:  Any grab bars or handrails.  First and last steps.  Where the edge of each step is.  Use tools that help you move around (mobility aids) if they are needed. These include:  Canes.  Walkers.  Scooters.  Crutches.  Turn on the lights when you go into a dark area. Replace any light bulbs as soon as they burn out.  Set up your furniture so you have a clear path. Avoid moving your furniture around.  If any of your floors are uneven, fix them.  If there are any pets around you, be aware of where they are.  Review your medicines with your doctor. Some medicines can make you feel dizzy. This can increase your chance of falling. Ask your doctor what other things that you can do to help prevent falls. This information is not intended to replace advice given to you by your health care provider. Make sure you discuss any questions you have with your health care provider. Document Released: 07/28/2009 Document Revised: 03/08/2016 Document Reviewed: 11/05/2014 Elsevier Interactive Patient Education  2017 [employer].

## 2020-08-16 ENCOUNTER — Other Ambulatory Visit: Payer: Self-pay | Admitting: Family Medicine

## 2020-08-16 DIAGNOSIS — E1129 Type 2 diabetes mellitus with other diabetic kidney complication: Secondary | ICD-10-CM

## 2020-08-16 NOTE — Telephone Encounter (Signed)
Requested Prescriptions  Pending Prescriptions Disp Refills  . JANUMET XR 50-1000 MG TB24 [Pharmacy Med Name: Janumet XR 50-1000 MG Oral Tablet Extended Release 24 Hour] 180 tablet 0    Sig: Take 2 tablets by mouth once daily     Endocrinology:  Diabetes - Biguanide + DPP-4 Inhibitor Combos Failed - 08/16/2020  9:15 PM      Failed - Cr in normal range and within 360 days    Creat  Date Value Ref Range Status  02/26/2020 1.02 (H) 0.60 - 0.93 mg/dL Final    Comment:    For patients >71 years of age, the reference limit for Creatinine is approximately 13% higher for people identified as African-American. .    Creatinine, Urine  Date Value Ref Range Status  10/30/2019 62 20 - 275 mg/dL Final         Passed - HBA1C is between 0 and 7.9 and within 180 days    Hemoglobin A1C  Date Value Ref Range Status  02/26/2020 7.0 (A) 4.0 - 5.6 % Final   HbA1c, POC (controlled diabetic range)  Date Value Ref Range Status  04/22/2019 7.4 (A) 0.0 - 7.0 % Final   Hgb A1c MFr Bld  Date Value Ref Range Status  10/30/2019 6.9 (H) <5.7 % of total Hgb Final    Comment:    For someone without known diabetes, a hemoglobin A1c value of 6.5% or greater indicates that they may have  diabetes and this should be confirmed with a follow-up  test. . For someone with known diabetes, a value <7% indicates  that their diabetes is well controlled and a value  greater than or equal to 7% indicates suboptimal  control. A1c targets should be individualized based on  duration of diabetes, age, comorbid conditions, and  other considerations. . Currently, no consensus exists regarding use of hemoglobin A1c for diagnosis of diabetes for children. .          Passed - AA eGFR in normal range and within 360 days    GFR, Est African American  Date Value Ref Range Status  02/26/2020 65 > OR = 60 mL/min/1.28m Final   GFR, Est Non African American  Date Value Ref Range Status  02/26/2020 56 (L) > OR = 60  mL/min/1.744mFinal         Passed - Valid encounter within last 6 months    Recent Outpatient Visits          5 months ago Type 2 diabetes mellitus with microalbuminuria, without long-term current use of insulin ([facility]   9 months ago Type 2 diabetes mellitus with microalbuminuria, without long-term current use of insulin ([facility]   1 year ago Type 2 diabetes mellitus with microalbuminuria, without long-term current use of insulin ([facility]   1 year ago Type 2 diabetes mellitus with microalbuminuria, without long-term current use of insulin ([facility]  [facility]   1 year ago Type 2 diabetes mellitus with microalbuminuria, without long-term current use of insulin ([facility]    Future Appointments            In 1 week SoSteele SizerMD [facility] In 11 months  [facility]

## 2020-08-26 NOTE — Progress Notes (Signed)
Name: Lindsey [name]   MRN: [id number]    DOB: 19-Aug-1951   Date:08/29/2020       Progress Note  Subjective  Chief Complaint  Follow up  HPI   DMII:HgbA1Chas been out of control 8.1%-8.2%8.2%,6.9%, 6.7%, 7.4% , down to 7 % and today is down to 6.8 % She has an aid coming to her house daily and has been compliant with medications. She denies polyphagia, polydipsia or polyuria. No recent episodes of hypoglycemia She has obesity, CKI , dyslipidemia.  Osteopenia: she was on Evista, but switched to Fosamax because of insurance changes. She is doing well,repeat bone density was done 02/2020and stable.She has a sitter that watches her take medications , she does not recline after taking Fosamax  Auditory Hallucination: seen by Dr. Melrose Nakayama and also  byDr.Koenig. She is still taking Seroquel and Aricept, the reason was increase in daytime episodes of hallucinations.She states hallucinations used to frighen her but no recent episodes.   HTN: denies dizziness.Nochest pain but has some sob with activity , she denies palpitation  She is compliant with medication. She has some anxiety coming to the office and heart rate is elevated today   Hyperlipidemia: taking Crestor and denies side effects of medication, including no muscle ache. She only comes to see me every 6 months and we will recheck labs today   Major Depressionin remission:daughter states patient seems happy, phq 9 positive but hard to judge since she has dementia  Dementia: seen by Dr. Melrose Nakayama, she states she continues to have auditoryhallucinations,but not as frequent.  . She still lives alone, daughter is managing her bills, doctors visits, and she also dispenses her medications, since last visit she also has an Aid that spends the mornings with her, family members check on her during the evening . She has not been cooking.  She still has lapses in her memory, but today she corrected herself. Initially she said  she wants to go visit her grandmother, but she meant to say her mother.   Patient Active Problem List   Diagnosis Date Noted  . Pain due to onychomycosis of toenails of both feet 05/04/2019  . 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 Mild dementia ([place]) 11/01/2015  . Auditory hallucination 07/26/2015  . Anemia of chronic disease 07/05/2015  . Cataract 04/06/2015  . Type 2 diabetes mellitus with diabetic cataract ([place]) 04/06/2015  . Benign essential HTN 04/02/2015  . Controlled gout 04/02/2015  . Dyslipidemia 04/02/2015  . Depression, major, recurrent, mild ([place]) 04/02/2015  . Fibromyalgia syndrome 04/02/2015  . Chronic insomnia 04/02/2015  . Cognitive decline 04/02/2015  . Nodular basal cell carcinoma 04/02/2015  . Morbid obesity ([place]) 04/02/2015  . Osteopenia 04/02/2015  . Calcium blood increased 04/02/2015    Past Surgical History:  Procedure Laterality Date  . CESAREAN SECTION    . TONSILECTOMY, ADENOIDECTOMY, BILATERAL MYRINGOTOMY AND TUBES    . TUBAL LIGATION      Family History  Problem Relation Age of Onset  . Hypertension Mother   . Osteoporosis Mother   . Cancer Mother        brain tumor  . Hodgkin's lymphoma Father   . Hypertension Sister   . Cancer Sister        unknown  . Hypertension Sister   . Diabetes Sister   . Cancer Sister 90       breast  . Breast cancer Sister 37  . Cancer Maternal Uncle     Social  History   Tobacco Use  . Smoking status: Never Smoker  . Smokeless tobacco: Never Used  . Tobacco comment: smoking cessation materials not required  Substance Use Topics  . Alcohol use: No    Alcohol/week: 0.0 standard drinks     Current Outpatient Medications:  .  alendronate (FOSAMAX) 70 MG tablet, TAKE 1 TABLET BY MOUTH ONCE A WEEK WITH A FULL GLASS OF WATER ON AN EMPTY STOMACH., Disp: 12 tablet, Rfl: 1 .  allopurinol (ZYLOPRIM) 100 MG tablet, Take 1 tablet (100 mg total) by mouth 2 (two) times daily., Disp:  180 tablet, Rfl: 1 .  aspirin 81 MG chewable tablet, Chew 1 tablet by mouth daily., Disp: , Rfl:  .  carvedilol (COREG) 12.5 MG tablet, Take 1 tablet (12.5 mg total) by mouth 2 (two) times daily with a meal., Disp: 180 tablet, Rfl: 1 .  donepezil (ARICEPT) 10 MG tablet, Take 1 tablet (10 mg total) by mouth at bedtime. (Patient taking differently: Take 10 mg by mouth daily. In the morning), Disp: 90 tablet, Rfl: 1 .  empagliflozin (JARDIANCE) 25 MG TABS tablet, Take 25 mg by mouth daily., Disp: 90 tablet, Rfl: 1 .  glipiZIDE (GLUCOTROL XL) 2.5 MG 24 hr tablet, Take 1 tablet (2.5 mg total) by mouth daily with breakfast., Disp: 90 tablet, Rfl: 1 .  GLUCOSE BLOOD VI, , Disp: , Rfl:  .  icosapent Ethyl (VASCEPA) 1 g capsule, Take 1 capsule (1 g total) by mouth 2 (two) times daily., Disp: 180 capsule, Rfl: 1 .  JANUMET XR 50-1000 MG TB24, Take 2 tablets by mouth once daily, Disp: 180 tablet, Rfl: 0 .  losartan (COZAAR) 25 MG tablet, Take 1 tablet by mouth once daily, Disp: 90 tablet, Rfl: 1 .  Melatonin 5 MG CAPS, Take 5 mg by mouth at bedtime. , Disp: , Rfl:  .  memantine (NAMENDA) 10 MG tablet, Take 10 mg by mouth 2 (two) times daily., Disp: , Rfl:  .  MULTIPLE VITAMIN PO, Take 1 tablet by mouth daily., Disp: , Rfl:  .  QUEtiapine (SEROQUEL) 100 MG tablet, takes 50 mg in the morning, 150 mg in the evening, and 100 mg at night., Disp: , Rfl:  .  rosuvastatin (CRESTOR) 20 MG tablet, Take 1 tablet by mouth once daily, Disp: 90 tablet, Rfl: 1 .  VITAMIN D PO, Take 1,000 mg by mouth daily. , Disp: , Rfl:   Allergies  Allergen Reactions  . Ace Inhibitors     I personally reviewed active problem list, medication list, allergies, family history, social history, health maintenance with the patient/caregiver today.   ROS  Constitutional: Negative for fever , positive for mild weight change.  Respiratory: Negative for cough she has  shortness of breath with activity .   Cardiovascular: Negative for  chest pain or palpitations.  Gastrointestinal: Negative for abdominal pain, no bowel changes.  Musculoskeletal: Negative for gait problem or joint swelling.  Skin: Negative for rash.  Neurological: Negative for dizziness or headache.  No other specific complaints in a complete review of systems (except as listed in HPI above).  Objective  Vitals:   08/29/20 1517  BP: 128/82  Pulse: 93  Resp: 17  Temp: 98.6 F (37 C)  TempSrc: Oral  SpO2: 100%  Weight: 149 lb 1.6 oz (67.6 kg)  Height: 4\' 9"  (1.448 m)    Body mass index is 32.26 kg/m.  Physical Exam  Constitutional: Patient appears well-developed and well-nourished. Obese  No distress.  HEENT: head atraumatic, normocephalic, pupils equal and reactive to light, neck supple Cardiovascular: Normal rate, regular rhythm and normal heart sounds.  No murmur heard. No BLE edema. Pulmonary/Chest: Effort normal and breath sounds normal. No respiratory distress. Abdominal: Soft.  There is no tenderness. Psychiatric: Patient has a normal mood and affect. Cooperative   Recent Results (from the past 2160 hour(s))  POCT HgB A1C     Status: Abnormal   Collection Time: 08/29/20  3:19 PM  Result Value Ref Range   Hemoglobin A1C 6.8 (A) 4.0 - 5.6 %   HbA1c POC (<> result, manual entry)     HbA1c, POC (prediabetic range)     HbA1c, POC (controlled diabetic range)      Diabetic Foot Exam: Diabetic Foot Exam - Simple   Simple Foot Form Diabetic Foot exam was performed with the following findings: Yes 08/29/2020  3:31 PM  Visual Inspection No deformities, no ulcerations, no other skin breakdown bilaterally: Yes Sensation Testing Intact to touch and monofilament testing bilaterally: Yes Pulse Check Posterior Tibialis and Dorsalis pulse intact bilaterally: Yes Comments      PHQ2/9: Depression screen [facility] 2/9 08/29/2020 08/29/2020 07/12/2020 02/26/2020 10/28/2019  Decreased Interest 1 0 0 1 0  Down, Depressed, Hopeless 0 0 0 0 0  PHQ -  2 Score 1 0 0 1 0  Altered sleeping 0 - - 1 1  Tired, decreased energy 0 - - 1 0  Change in appetite 1 - - 0 1  Feeling bad or failure about yourself  0 - - 0 0  Trouble concentrating 1 - - 0 1  Moving slowly or fidgety/restless 0 - - 0 1  Suicidal thoughts 0 - - 0 0  PHQ-9 Score 3 - - 3 4  Difficult doing work/chores Somewhat difficult - - Not difficult at all Somewhat difficult  Some recent data might be hidden    phq 9 is positive   Fall Risk: Fall Risk  08/29/2020 07/12/2020 02/26/2020 10/28/2019 07/09/2019  Falls in the past year? 0 0 0 0 0  Number falls in past yr: 0 0 0 0 0  Comment - - - - -  Injury with Fall? 0 0 0 0 0  Risk for fall due to : - No Fall Risks - - -  Risk for fall due to: Comment - - - - -  Follow up - Falls prevention discussed - - Falls prevention discussed     Functional Status Survey: Is the patient deaf or have difficulty hearing?: No Does the patient have difficulty seeing, even when wearing glasses/contacts?: No Does the patient have difficulty concentrating, remembering, or making decisions?: Yes Does the patient have difficulty walking or climbing stairs?: No Does the patient have difficulty dressing or bathing?: No (Has assistance) Does the patient have difficulty doing errands alone such as visiting a doctor's office or shopping?: No    Assessment & Plan  1. Type 2 diabetes mellitus with microalbuminuria, without long-term current use of insulin (HCC)  - POCT HgB A1C - empagliflozin (JARDIANCE) 25 MG TABS tablet; Take 1 tablet (25 mg total) by mouth daily.  Dispense: 90 tablet; Refill: 1 - glipiZIDE (GLUCOTROL XL) 2.5 MG 24 hr tablet; Take 1 tablet (2.5 mg total) by mouth daily with breakfast.  Dispense: 90 tablet; Refill: 1 - SitaGLIPtin-MetFORMIN HCl (JANUMET XR) 50-1000 MG TB24; Take 2 tablets by mouth daily.  Dispense: 180 tablet; Refill: 1 - losartan (COZAAR) 25 MG tablet; Take 1 tablet (25 mg  total) by mouth daily.  Dispense: 90  tablet; Refill: 1  2. Benign essential HTN  - carvedilol (COREG) 12.5 MG tablet; Take 1 tablet (12.5 mg total) by mouth 2 (two) times daily with a meal.  Dispense: 180 tablet; Refill: 1 - COMPLETE METABOLIC PANEL WITH GFR  3. Osteopenia, unspecified location  - alendronate (FOSAMAX) 70 MG tablet; Take with a full glass of water on an empty stomach.  Dispense: 12 tablet; Refill: 1 - VITAMIN D 25 Hydroxy (Vit-D Deficiency, Fractures)  4. Controlled gout  - allopurinol (ZYLOPRIM) 100 MG tablet; Take 1 tablet (100 mg total) by mouth 2 (two) times daily.  Dispense: 180 tablet; Refill: 1  5. Dyslipidemia  - icosapent Ethyl (VASCEPA) 1 g capsule; Take 1 capsule (1 g total) by mouth 2 (two) times daily.  Dispense: 180 capsule; Refill: 1 - rosuvastatin (CRESTOR) 20 MG tablet; Take 1 tablet (20 mg total) by mouth daily.  Dispense: 90 tablet; Refill: 1 - Lipid panel  6. Auditory hallucination   7. Morbid obesity ([place])  Discussed with the patient the risk posed by an increased BMI. Discussed importance of portion control, calorie counting and at least 150 minutes of physical activity weekly. Avoid sweet beverages and drink more water. Eat at least 6 servings of fruit and vegetables daily   8. Dementia due to medical condition without behavioral disturbance (HCC)   9. Stage 3a chronic kidney disease (HCC)  - Microalbumin / creatinine urine ratio  10. Iron deficiency  - CBC with Differential/Platelet - Iron, TIBC and Ferritin Panel  11. Tachycardia  - CBC with Differential/Platelet - TSH

## 2020-08-29 ENCOUNTER — Ambulatory Visit (INDEPENDENT_AMBULATORY_CARE_PROVIDER_SITE_OTHER): Payer: Medicare Other | Admitting: Family Medicine

## 2020-08-29 ENCOUNTER — Encounter: Payer: Self-pay | Admitting: Family Medicine

## 2020-08-29 ENCOUNTER — Other Ambulatory Visit: Payer: Self-pay

## 2020-08-29 VITALS — BP 128/82 | HR 93 | Temp 98.6°F | Resp 17 | Ht <= 58 in | Wt 149.1 lb

## 2020-08-29 DIAGNOSIS — M109 Gout, unspecified: Secondary | ICD-10-CM

## 2020-08-29 DIAGNOSIS — F0280 Dementia in other diseases classified elsewhere without behavioral disturbance: Secondary | ICD-10-CM

## 2020-08-29 DIAGNOSIS — I10 Essential (primary) hypertension: Secondary | ICD-10-CM | POA: Diagnosis not present

## 2020-08-29 DIAGNOSIS — E785 Hyperlipidemia, unspecified: Secondary | ICD-10-CM

## 2020-08-29 DIAGNOSIS — R000 Tachycardia, unspecified: Secondary | ICD-10-CM

## 2020-08-29 DIAGNOSIS — R809 Proteinuria, unspecified: Secondary | ICD-10-CM

## 2020-08-29 DIAGNOSIS — E1129 Type 2 diabetes mellitus with other diabetic kidney complication: Secondary | ICD-10-CM

## 2020-08-29 DIAGNOSIS — R440 Auditory hallucinations: Secondary | ICD-10-CM

## 2020-08-29 DIAGNOSIS — M8580 Other specified disorders of bone density and structure, unspecified site: Secondary | ICD-10-CM

## 2020-08-29 DIAGNOSIS — E611 Iron deficiency: Secondary | ICD-10-CM

## 2020-08-29 DIAGNOSIS — N1831 Chronic kidney disease, stage 3a: Secondary | ICD-10-CM | POA: Diagnosis not present

## 2020-08-29 LAB — POCT GLYCOSYLATED HEMOGLOBIN (HGB A1C): Hemoglobin A1C: 6.8 % — AB (ref 4.0–5.6)

## 2020-08-29 MED ORDER — ICOSAPENT ETHYL 1 G PO CAPS: 1.0000 g | ORAL_CAPSULE | Freq: Two times a day (BID) | ORAL | 1 refills | Status: DC

## 2020-08-29 MED ORDER — LOSARTAN POTASSIUM 25 MG PO TABS: 25.0000 mg | ORAL_TABLET | Freq: Every day | ORAL | 1 refills | Status: DC

## 2020-08-29 MED ORDER — EMPAGLIFLOZIN 25 MG PO TABS: 25.0000 mg | ORAL_TABLET | Freq: Every day | ORAL | 1 refills | Status: DC

## 2020-08-29 MED ORDER — JANUMET XR 50-1000 MG PO TB24: 2.0000 | ORAL_TABLET | Freq: Every day | ORAL | 1 refills | Status: DC

## 2020-08-29 MED ORDER — ALLOPURINOL 100 MG PO TABS: 100.0000 mg | ORAL_TABLET | Freq: Two times a day (BID) | ORAL | 1 refills | Status: DC

## 2020-08-29 MED ORDER — CARVEDILOL 12.5 MG PO TABS: 12.5000 mg | ORAL_TABLET | Freq: Two times a day (BID) | ORAL | 1 refills | Status: DC

## 2020-08-29 MED ORDER — ALENDRONATE SODIUM 70 MG PO TABS: ORAL_TABLET | ORAL | 1 refills | Status: DC

## 2020-08-29 MED ORDER — GLIPIZIDE ER 2.5 MG PO TB24: 2.5000 mg | ORAL_TABLET | Freq: Every day | ORAL | 1 refills | Status: DC

## 2020-08-29 MED ORDER — ROSUVASTATIN CALCIUM 20 MG PO TABS: 20.0000 mg | ORAL_TABLET | Freq: Every day | ORAL | 1 refills | Status: DC

## 2020-08-30 LAB — CBC WITH DIFFERENTIAL/PLATELET
Absolute Monocytes: 725 cells/uL (ref 200–950)
Basophils Absolute: 65 cells/uL (ref 0–200)
Basophils Relative: 0.7 %
Eosinophils Absolute: 149 cells/uL (ref 15–500)
Eosinophils Relative: 1.6 %
HCT: 39.8 % (ref 35.0–45.0)
Hemoglobin: 13.1 g/dL (ref 11.7–15.5)
Lymphs Abs: 2948 cells/uL (ref 850–3900)
MCH: 28.6 pg (ref 27.0–33.0)
MCHC: 32.9 g/dL (ref 32.0–36.0)
MCV: 86.9 fL (ref 80.0–100.0)
MPV: 12.3 fL (ref 7.5–12.5)
Monocytes Relative: 7.8 %
Neutro Abs: 5413 cells/uL (ref 1500–7800)
Neutrophils Relative %: 58.2 %
Platelets: 253 10*3/uL (ref 140–400)
RBC: 4.58 10*6/uL (ref 3.80–5.10)
RDW: 13.8 % (ref 11.0–15.0)
Total Lymphocyte: 31.7 %
WBC: 9.3 10*3/uL (ref 3.8–10.8)

## 2020-08-30 LAB — MICROALBUMIN / CREATININE URINE RATIO
Creatinine, Urine: 63 mg/dL (ref 20–275)
Microalb Creat Ratio: 284 mcg/mg creat — ABNORMAL HIGH (ref <30)
Microalb, Ur: 17.9 mg/dL

## 2020-08-30 LAB — COMPLETE METABOLIC PANEL WITH GFR
AG Ratio: 2.2 (calc) (ref 1.0–2.5)
ALT: 17 U/L (ref 6–29)
AST: 20 U/L (ref 10–35)
Albumin: 4.8 g/dL (ref 3.6–5.1)
Alkaline phosphatase (APISO): 69 U/L (ref 37–153)
BUN/Creatinine Ratio: 11 (calc) (ref 6–22)
BUN: 11 mg/dL (ref 7–25)
CO2: 30 mmol/L (ref 20–32)
Calcium: 10.7 mg/dL — ABNORMAL HIGH (ref 8.6–10.4)
Chloride: 104 mmol/L (ref 98–110)
Creat: 1 mg/dL — ABNORMAL HIGH (ref 0.60–0.93)
GFR, Est African American: 66 mL/min/{1.73_m2} (ref >=60)
GFR, Est Non African American: 57 mL/min/{1.73_m2} — ABNORMAL LOW (ref >=60)
Globulin: 2.2 g/dL (calc) (ref 1.9–3.7)
Glucose, Bld: 149 mg/dL — ABNORMAL HIGH (ref 65–139)
Potassium: 3.7 mmol/L (ref 3.5–5.3)
Sodium: 142 mmol/L (ref 135–146)
Total Bilirubin: 0.6 mg/dL (ref 0.2–1.2)
Total Protein: 7 g/dL (ref 6.1–8.1)

## 2020-08-30 LAB — IRON,TIBC AND FERRITIN PANEL
%SAT: 10 % (calc) — ABNORMAL LOW (ref 16–45)
Ferritin: 34 ng/mL (ref 16–288)
Iron: 46 ug/dL (ref 45–160)
TIBC: 451 mcg/dL (calc) — ABNORMAL HIGH (ref 250–450)

## 2020-08-30 LAB — LIPID PANEL
Cholesterol: 102 mg/dL (ref <200)
HDL: 39 mg/dL — ABNORMAL LOW (ref >=50)
LDL Cholesterol (Calc): 45 mg/dL (calc)
Non-HDL Cholesterol (Calc): 63 mg/dL (calc) (ref <130)
Total CHOL/HDL Ratio: 2.6 (calc) (ref <5.0)
Triglycerides: 101 mg/dL (ref <150)

## 2020-08-30 LAB — VITAMIN D 25 HYDROXY (VIT D DEFICIENCY, FRACTURES): Vit D, 25-Hydroxy: 58 ng/mL (ref 30–100)

## 2020-08-30 LAB — TSH: TSH: 3.67 mIU/L (ref 0.40–4.50)

## 2020-09-07 ENCOUNTER — Ambulatory Visit: Payer: Self-pay

## 2020-09-07 DIAGNOSIS — E1136 Type 2 diabetes mellitus with diabetic cataract: Secondary | ICD-10-CM

## 2020-09-07 DIAGNOSIS — M8580 Other specified disorders of bone density and structure, unspecified site: Secondary | ICD-10-CM

## 2020-09-07 NOTE — Patient Instructions (Addendum)
Visit Information It was great speaking with you today!  Please let me know if you have any questions about our visit. Goals Addressed            This Visit's Progress   . Chronic Care Management       CARE PLAN ENTRY  Current Barriers:  . Chronic Disease Management support, education, and care coordination needs related to Hypertension, Hyperlipidemia, Diabetes, Depression, Osteopenia, Gout, and Dementia    Hypertension BP Readings from Last 3 Encounters:  08/29/20 128/82  02/26/20 128/82  11/05/19 136/81   . Pharmacist Clinical Goal(s): o Over the next 90 days, patient will work with PharmD and providers to maintain BP goal <130/80 . Current regimen:  . Carvedilol 12.5 mg twice daily  . Losartan 25 mg daily  . Interventions: o Discussed low salt diet and exercising as tolerated extensively o Will initiate blood pressure monitoring plan  . Patient self care activities - Over the next 90 days, patient will: o Check Blood Pressure Weekly, document, and provide at future appointments o Ensure daily salt intake < 2300 mg/day  Hyperlipidemia Lab Results  Component Value Date/Time   LDLCALC 45 08/29/2020 03:43 PM   . Pharmacist Clinical Goal(s): o Over the next 90 days, patient will work with PharmD and providers to maintain LDL goal < 70 . Current regimen:  Marland Kitchen Vascepa 1 g twice daily  . Rosuvastatin 20 mg daily  . Interventions: o Discussed low cholesterol diet and exercising as tolerated extensively o Will initiate cholesterol monitoring plan   Diabetes Lab Results  Component Value Date/Time   HGBA1C 6.8 (A) 08/29/2020 03:19 PM   HGBA1C 7.0 (A) 02/26/2020 03:50 PM   HGBA1C 6.9 (H) 10/30/2019 09:24 AM   HGBA1C 7.4 (A) 04/22/2019 04:03 PM   HGBA1C 6.7 (H) 10/14/2018 10:01 AM   HGBA1C 6.9 07/04/2018 03:59 PM   . Pharmacist Clinical Goal(s): o Over the next 90 days, patient will work with PharmD and providers to maintain A1c goal <7% . Current regimen:  o Jardiance  25 mg daily  o Janumet XR 50-1000 mg 2 tablets daily  o Glipizide XR 2.5 mg daily . Interventions: o Discussed carbohydrate counting and exercising as tolerated extensively o Will initiate blood sugar monitoring plan  . Patient self care activities - Over the next 90 days, patient will: o Check blood sugar once daily, document, and provide at future appointments o Contact provider with any episodes of hypoglycemia  Medication management . Pharmacist Clinical Goal(s): o Over the next 90 days, patient will work with PharmD and providers to maintain optimal medication adherence . Current pharmacy: [facility] . Interventions o Comprehensive medication review performed. o Continue current medication management strategy . Patient self care activities - Over the next 90 days, patient will: o Take medications as prescribed o Report any questions or concerns to PharmD and/or provider(s)       The patient verbalized understanding of instructions, educational materials, and care plan provided today and declined offer to receive copy of patient instructions, educational materials, and care plan.   Telephone follow up appointment with pharmacy team member scheduled for: 05/11/21 at 2:30 PM  [facility] [phone]

## 2020-09-07 NOTE — Chronic Care Management (AMB) (Signed)
Chronic Care Management Pharmacy  Name: Lindsey [name]  MRN: [id number] DOB: Jan 05, 1951  Chief Complaint/ HPI  [name],  69 y.o. , female presents for their Follow-Up CCM visit with the clinical pharmacist via telephone.  PCP : Steele Sizer, MD  Their chronic conditions include: Hypertension, Hyperlipidemia, Diabetes, Depression, Osteopenia, Gout, and Dementia  Office Visits: 08/29/20: Patient presented to Dr. Ancil Boozer for follow-up. Labs stable, no medication changes made.   Consult Visit: 04/13/20: Patient presented to Dr. Donalynn Furlong for depression follow-up.   Medications: Outpatient Encounter Medications as of 09/07/2020  Medication Sig Note  . alendronate (FOSAMAX) 70 MG tablet Take with a full glass of water on an empty stomach.   Marland Kitchen allopurinol (ZYLOPRIM) 100 MG tablet Take 1 tablet (100 mg total) by mouth 2 (two) times daily.   Marland Kitchen aspirin 81 MG chewable tablet Chew 1 tablet by mouth daily. 04/06/2015: DX: 401.1 Received from: [place]:   . carvedilol (COREG) 12.5 MG tablet Take 1 tablet (12.5 mg total) by mouth 2 (two) times daily with a meal.   . donepezil (ARICEPT) 10 MG tablet Take 1 tablet (10 mg total) by mouth at bedtime. (Patient taking differently: Take 10 mg by mouth daily. In the morning)   . empagliflozin (JARDIANCE) 25 MG TABS tablet Take 1 tablet (25 mg total) by mouth daily.   Marland Kitchen glipiZIDE (GLUCOTROL XL) 2.5 MG 24 hr tablet Take 1 tablet (2.5 mg total) by mouth daily with breakfast.   . GLUCOSE BLOOD VI  04/06/2015: DX: 250.00 Received from: [facility]  . icosapent Ethyl (VASCEPA) 1 g capsule Take 1 capsule (1 g total) by mouth 2 (two) times daily.   Marland Kitchen losartan (COZAAR) 25 MG tablet Take 1 tablet (25 mg total) by mouth daily.   . Melatonin 5 MG CAPS Take 5 mg by mouth at bedtime.    . memantine (NAMENDA) 10 MG tablet Take 10 mg by mouth 2 (two) times daily.   . MULTIPLE VITAMIN PO Take 1 tablet by mouth daily.  04/06/2015: Received from: [place]: Take by mouth.  . QUEtiapine (SEROQUEL) 100 MG tablet takes 50 mg in the morning, 150 mg in the evening, and 100 mg at night.   . rosuvastatin (CRESTOR) 20 MG tablet Take 1 tablet (20 mg total) by mouth daily.   . SitaGLIPtin-MetFORMIN HCl (JANUMET XR) 50-1000 MG TB24 Take 2 tablets by mouth daily.   Marland Kitchen VITAMIN D PO Take 1,000 mg by mouth daily.     No facility-administered encounter medications on file as of 09/07/2020.   Current Diagnosis/Assessment:  SDOH Interventions     Most Recent Value  SDOH Interventions  Financial Strain Interventions Intervention Not Indicated  Transportation Interventions Intervention Not Indicated     Goals Addressed            This Visit's Progress   . Chronic Care Management       CARE PLAN ENTRY  Current Barriers:  . Chronic Disease Management support, education, and care coordination needs related to Hypertension, Hyperlipidemia, Diabetes, Depression, Osteopenia, Gout, and Dementia    Hypertension BP Readings from Last 3 Encounters:  08/29/20 128/82  02/26/20 128/82  11/05/19 136/81   . Pharmacist Clinical Goal(s): o Over the next 90 days, patient will work with PharmD and providers to maintain BP goal <130/80 . Current regimen:  . Carvedilol 12.5 mg twice daily  . Losartan 25 mg daily  . Interventions: o Discussed low  salt diet and exercising as tolerated extensively o Will initiate blood pressure monitoring plan  . Patient self care activities - Over the next 90 days, patient will: o Check Blood Pressure Weekly, document, and provide at future appointments o Ensure daily salt intake < 2300 mg/day  Hyperlipidemia Lab Results  Component Value Date/Time   LDLCALC 45 08/29/2020 03:43 PM   . Pharmacist Clinical Goal(s): o Over the next 90 days, patient will work with PharmD and providers to maintain LDL goal < 70 . Current regimen:  Marland Kitchen Vascepa 1 g twice daily   . Rosuvastatin 20 mg daily  . Interventions: o Discussed low cholesterol diet and exercising as tolerated extensively o Will initiate cholesterol monitoring plan   Diabetes Lab Results  Component Value Date/Time   HGBA1C 6.8 (A) 08/29/2020 03:19 PM   HGBA1C 7.0 (A) 02/26/2020 03:50 PM   HGBA1C 6.9 (H) 10/30/2019 09:24 AM   HGBA1C 7.4 (A) 04/22/2019 04:03 PM   HGBA1C 6.7 (H) 10/14/2018 10:01 AM   HGBA1C 6.9 07/04/2018 03:59 PM   . Pharmacist Clinical Goal(s): o Over the next 90 days, patient will work with PharmD and providers to maintain A1c goal <7% . Current regimen:  o Jardiance 25 mg daily  o Janumet XR 50-1000 mg 2 tablets daily  o Glipizide XR 2.5 mg daily . Interventions: o Discussed carbohydrate counting and exercising as tolerated extensively o Will initiate blood sugar monitoring plan  . Patient self care activities - Over the next 90 days, patient will: o Check blood sugar once daily, document, and provide at future appointments o Contact provider with any episodes of hypoglycemia  Medication management . Pharmacist Clinical Goal(s): o Over the next 90 days, patient will work with PharmD and providers to maintain optimal medication adherence . Current pharmacy: [facility] . Interventions o Comprehensive medication review performed. o Continue current medication management strategy . Patient self care activities - Over the next 90 days, patient will: o Take medications as prescribed o Report any questions or concerns to PharmD and/or provider(s)      Hypertension   BP goal is:  <130/80  Office blood pressures are  BP Readings from Last 3 Encounters:  08/29/20 128/82  02/26/20 128/82  11/05/19 136/81   Patient checks BP at home weekly Patient home BP readings are ranging: 110/78   Patient has failed these meds in the past: n/a Patient is currently controlled on the following medications:  . Carvedilol 12.5 mg twice daily  . Losartan 25 mg daily   We  discussed diet and exercise extensively  Plan  Continue current medications    Diabetes   Recent Relevant Labs: Lab Results  Component Value Date/Time   HGBA1C 6.8 (A) 08/29/2020 03:19 PM   HGBA1C 7.0 (A) 02/26/2020 03:50 PM   HGBA1C 6.9 (H) 10/30/2019 09:24 AM   HGBA1C 7.4 (A) 04/22/2019 04:03 PM   HGBA1C 6.7 (H) 10/14/2018 10:01 AM   HGBA1C 6.9 07/04/2018 03:59 PM   MICROALBUR 17.9 08/29/2020 03:43 PM   MICROALBUR 1.7 10/30/2019 09:24 AM   MICROALBUR 20 08/02/2017 02:50 PM   MICROALBUR NEGATIVE 04/06/2016 08:24 AM    Patient is currently controlled on the following medications:  Jardiance 25 mg daily   Janumet XR 50-1000 mg 2 tablets daily   Glipizide XR 2.5 mg daily   Last diabetic Foot exam:  Lab Results  Component Value Date/Time   HMDIABEYEEXA No Retinopathy 02/05/2020 12:00 AM    Last diabetic Eye exam: No results found for:  HMDIABFOOTEX   We discussed:  Borderline at goal Diet improvements already implemented but not reflected in A1c  Plan  Continue current medications  Hyperlipidemia   LDL goal < 70  Last lipids Lab Results  Component Value Date   CHOL 102 08/29/2020   HDL 39 (L) 08/29/2020   LDLCALC 45 08/29/2020   TRIG 101 08/29/2020   CHOLHDL 2.6 08/29/2020   Hepatic Function Latest Ref Rng & Units 08/29/2020 02/26/2020 11/05/2019  Total Protein 6.1 - 8.1 g/dL 7.0 6.2 6.4(L)  Albumin 3.5 - 5.0 g/dL - - 3.8  AST 10 - 35 U/L 20 17 35  ALT 6 - 29 U/L '17 11 29  ' Alk Phosphatase 38 - 126 U/L - - 61  Total Bilirubin 0.2 - 1.2 mg/dL 0.6 0.5 0.5     The ASCVD Risk score Mikey Bussing DC Jr., et al., 2013) failed to calculate for the following reasons:   The valid total cholesterol range is 130 to 320 mg/dL   Patient has failed these meds in past: n/a Patient is currently controlled on the following medications:  . Aspirin 81 mg daily  . Vascepa 1 g twice daily  . Rosuvastatin 20 mg daily   We discussed:  diet and exercise  extensively  Plan  Continue current medications  Depression / Dementia    PHQ9 Score:  PHQ9 SCORE ONLY 08/29/2020 08/29/2020 07/12/2020  PHQ-9 Total Score 3 0 0   GAD7 Score: GAD 7 : Generalized Anxiety Score 10/14/2018  Nervous, Anxious, on Edge 2  Control/stop worrying 1  Worry too much - different things 1  Trouble relaxing 1  Restless 1  Easily annoyed or irritable 2  Afraid - awful might happen 1  Total GAD 7 Score 9  Anxiety Difficulty Somewhat difficult    Patient has failed these meds in past: n/a Patient is currently controlled on the following medications:  . Donepezil 10 mg daily  . Memantine 10 mg twice daily  . Seroquel 100 mg, 1/2 tab AM 1.5 tab PM, 1 tab QHS  Plan  Continue current medications  Gout   Uric Acid  Date Value Ref Range Status  12/15/2015 8.1 (H) 2.5 - 7.1 mg/dL Final    Comment:               Therapeutic target for gout patients: <6.0     Goal Uric Acid < 6 mg/dL   Medications that may increase uric acid levels: Aspirin  Last gout flare: n/a  Patient has failed these meds in past: n/a Patient is currently controlled on the following medications:  . Allopurinol 100 mg twice daily   We discussed:  Counseled patient on low purine diet plan. Counseled patient to reduce consumption of high-fructose corn syrup, sweetened soft drinks, fruit juices, meat, and seafood.  Plan  Continue current medications  Osteopenia / Osteoporosis   Last DEXA Scan: 11/25/18   T-Score femoral neck: -1.9  T-Score total hip: -1.5  T-Score lumbar spine: -1.8  T-Score forearm radius: n/a  10-year probability of major osteoporotic fracture: n/a  10-year probability of hip fracture: n/a  Vit D, 25-Hydroxy  Date Value Ref Range Status  08/29/2020 58 30 - 100 ng/mL Final    Comment:    Vitamin D Status         25-OH Vitamin D: . Deficiency:                    <20 ng/mL Insufficiency:  20 - 29 ng/mL Optimal:                 > or = 30  ng/mL . For 25-OH Vitamin D testing on patients on  D2-supplementation and patients for whom quantitation  of D2 and D3 fractions is required, the QuestAssureD(TM) 25-OH VIT D, (D2,D3), LC/MS/MS is recommended: order  code [id number] (patients >86yr). See Note 1 . Note 1 . For additional information, please refer to  http://education.Quest[facility].com/faq/FAQ199  (This link is being provided for informational/ educational purposes only.)     Patient has failed these meds in past: n/a Patient is currently controlled on the following medications:  . Alendronate 70 mg weekly (started January 2018)  . Vitamin D 1000 units daily   We discussed:  Recommend [phone] units of vitamin D daily. Counseled on oral bisphosphonate administration: take in the morning, 30 minutes prior to food with 6-8 oz of water. Do not lie down for at least 30 minutes after taking.   Patient stopped calcium per PCP because it was elevated. Patient's diet is not high in calcium.   Plan  Continue current medications Recommend restarting calcium 500 mg daily if calcium is WNL at next check.   Misc / OTC   . Melatonin 5 mg nightly  . Multivitamin daily   Plan  Continue current medications  Medication Management   Uses pill box? Yes Pt endorses 100% compliance  Plan  Continue current medication management strategy  Follow up: 8 month phone visit  [phone]

## 2020-10-27 ENCOUNTER — Encounter: Payer: Self-pay | Admitting: Family Medicine

## 2020-11-01 ENCOUNTER — Encounter: Payer: Self-pay | Admitting: Family Medicine

## 2020-11-01 ENCOUNTER — Other Ambulatory Visit: Payer: Self-pay | Admitting: Family Medicine

## 2020-11-01 DIAGNOSIS — E785 Hyperlipidemia, unspecified: Secondary | ICD-10-CM

## 2020-11-08 ENCOUNTER — Telehealth: Payer: Self-pay

## 2020-11-08 NOTE — Progress Notes (Signed)
Chronic Care Management Pharmacy Assistant   Name: Lindsey [name]  MRN: [id number] DOB: December 04, 1950  Reason for Encounter:Diabetes Disease State Call.   PCP : Steele Sizer, MD  Allergies:   Allergies  Allergen Reactions  . Ace Inhibitors     Medications: Outpatient Encounter Medications as of 11/08/2020  Medication Sig Note  . alendronate (FOSAMAX) 70 MG tablet Take with a full glass of water on an empty stomach.   Marland Kitchen allopurinol (ZYLOPRIM) 100 MG tablet Take 1 tablet (100 mg total) by mouth 2 (two) times daily.   Marland Kitchen aspirin 81 MG chewable tablet Chew 1 tablet by mouth daily. 04/06/2015: DX: 401.1 Received from: [place]:   . carvedilol (COREG) 12.5 MG tablet Take 1 tablet (12.5 mg total) by mouth 2 (two) times daily with a meal.   . donepezil (ARICEPT) 10 MG tablet Take 1 tablet (10 mg total) by mouth at bedtime. (Patient taking differently: Take 10 mg by mouth daily. In the morning)   . empagliflozin (JARDIANCE) 25 MG TABS tablet Take 1 tablet (25 mg total) by mouth daily.   Marland Kitchen glipiZIDE (GLUCOTROL XL) 2.5 MG 24 hr tablet Take 1 tablet (2.5 mg total) by mouth daily with breakfast.   . GLUCOSE BLOOD VI  04/06/2015: DX: 250.00 Received from: [facility]  . icosapent Ethyl (VASCEPA) 1 g capsule Take 1 capsule (1 g total) by mouth 2 (two) times daily.   Marland Kitchen losartan (COZAAR) 25 MG tablet Take 1 tablet (25 mg total) by mouth daily.   . Melatonin 5 MG CAPS Take 5 mg by mouth at bedtime.    . memantine (NAMENDA) 10 MG tablet Take 10 mg by mouth 2 (two) times daily.   . MULTIPLE VITAMIN PO Take 1 tablet by mouth daily. 04/06/2015: Received from: [place]: Take by mouth.  . QUEtiapine (SEROQUEL) 100 MG tablet takes 50 mg in the morning, 150 mg in the evening, and 100 mg at night.   . rosuvastatin (CRESTOR) 20 MG tablet Take 1 tablet by mouth once daily   . SitaGLIPtin-MetFORMIN HCl (JANUMET XR) 50-1000 MG  TB24 Take 2 tablets by mouth daily.   Marland Kitchen VITAMIN D PO Take 1,000 mg by mouth daily.     No facility-administered encounter medications on file as of 11/08/2020.    Current Diagnosis: Patient Active Problem List   Diagnosis Date Noted  . Pain due to onychomycosis of toenails of both feet 05/04/2019  . 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 Mild dementia ([place]) 11/01/2015  . Auditory hallucination 07/26/2015  . Anemia of chronic disease 07/05/2015  . Cataract 04/06/2015  . Type 2 diabetes mellitus with diabetic cataract ([place]) 04/06/2015  . Benign essential HTN 04/02/2015  . Controlled gout 04/02/2015  . Dyslipidemia 04/02/2015  . Depression, major, recurrent, mild ([place]) 04/02/2015  . Fibromyalgia syndrome 04/02/2015  . Chronic insomnia 04/02/2015  . Cognitive decline 04/02/2015  . Nodular basal cell carcinoma 04/02/2015  . Morbid obesity ([place]) 04/02/2015  . Osteopenia 04/02/2015  . Calcium blood increased 04/02/2015    Goals Addressed   None    Recent Relevant Labs: Lab Results  Component Value Date/Time   HGBA1C 6.8 (A) 08/29/2020 03:19 PM   HGBA1C 7.0 (A) 02/26/2020 03:50 PM   HGBA1C 6.9 (H) 10/30/2019 09:24 AM   HGBA1C 7.4 (A) 04/22/2019 04:03 PM   HGBA1C 6.7 (H) 10/14/2018 10:01 AM   HGBA1C 6.9 07/04/2018 03:59 PM  MICROALBUR 17.9 08/29/2020 03:43 PM   MICROALBUR 1.7 10/30/2019 09:24 AM   MICROALBUR 20 08/02/2017 02:50 PM   MICROALBUR NEGATIVE 04/06/2016 08:24 AM    Kidney Function Lab Results  Component Value Date/Time   CREATININE 1.00 (H) 08/29/2020 03:43 PM   CREATININE 1.02 (H) 02/26/2020 04:30 PM   GFRNONAA 57 (L) 08/29/2020 03:43 PM   GFRAA 66 08/29/2020 03:43 PM    . Current antihyperglycemic regimen:  ? Jardiance 25 mg daily  ? Janumet XR 50-1000 mg 2 tablets daily  ? Glipizide XR 2.5 mg daily . What recent interventions/DTPs have been made to improve glycemic control:  o None ID . Have there been any recent  hospitalizations or ED visits since last visit with CPP? No  Adherence Review: Is the patient currently on a STATIN medication? Yes Is the patient currently on ACE/ARB medication? Yes Does the patient have >5 day gap between last estimated fill dates? Yes   Maryjean Ka  I have attempted without success to contact this patient by phone three times to do her Diabetes Disease State call. I left a Voice message for patient to return my call.  Left voice message  01/25,01/27,01/31  BK   Follow-Up:  Pharmacist Review     [place] Clinical Pharmacist Assistant [phone]

## 2020-11-23 ENCOUNTER — Encounter: Payer: Self-pay | Admitting: Family Medicine

## 2021-01-09 ENCOUNTER — Telehealth: Payer: Self-pay

## 2021-01-09 NOTE — Progress Notes (Signed)
Chronic Care Management Pharmacy Assistant   Name: Lindsey [name]  MRN: [id number] DOB: 22-Oct-1950   Reason for [place] Call.   Recent office visits:  None ID  Recent consult visits:  None ID  Hospital visits:  None in previous 6 months  Medications: Outpatient Encounter Medications as of 01/09/2021  Medication Sig Note  . alendronate (FOSAMAX) 70 MG tablet Take with a full glass of water on an empty stomach.   Marland Kitchen allopurinol (ZYLOPRIM) 100 MG tablet Take 1 tablet (100 mg total) by mouth 2 (two) times daily.   Marland Kitchen aspirin 81 MG chewable tablet Chew 1 tablet by mouth daily. 04/06/2015: DX: 401.1 Received from: [place]:   . carvedilol (COREG) 12.5 MG tablet Take 1 tablet (12.5 mg total) by mouth 2 (two) times daily with a meal.   . donepezil (ARICEPT) 10 MG tablet Take 1 tablet (10 mg total) by mouth at bedtime. (Patient taking differently: Take 10 mg by mouth daily. In the morning)   . empagliflozin (JARDIANCE) 25 MG TABS tablet Take 1 tablet (25 mg total) by mouth daily.   Marland Kitchen glipiZIDE (GLUCOTROL XL) 2.5 MG 24 hr tablet Take 1 tablet (2.5 mg total) by mouth daily with breakfast.   . GLUCOSE BLOOD VI  04/06/2015: DX: 250.00 Received from: [facility]  . icosapent Ethyl (VASCEPA) 1 g capsule Take 1 capsule (1 g total) by mouth 2 (two) times daily.   Marland Kitchen losartan (COZAAR) 25 MG tablet Take 1 tablet (25 mg total) by mouth daily.   . Melatonin 5 MG CAPS Take 5 mg by mouth at bedtime.    . memantine (NAMENDA) 10 MG tablet Take 10 mg by mouth 2 (two) times daily.   . MULTIPLE VITAMIN PO Take 1 tablet by mouth daily. 04/06/2015: Received from: [place]: Take by mouth.  . QUEtiapine (SEROQUEL) 100 MG tablet takes 50 mg in the morning, 150 mg in the evening, and 100 mg at night.   . rosuvastatin (CRESTOR) 20 MG tablet Take 1 tablet by mouth once daily   . SitaGLIPtin-MetFORMIN HCl  (JANUMET XR) 50-1000 MG TB24 Take 2 tablets by mouth daily.   Marland Kitchen VITAMIN D PO Take 1,000 mg by mouth daily.     No facility-administered encounter medications on file as of 01/09/2021.   Star Rating Drugs: Glipizide 2.5mg  ,Jardiance 25 mg,losartan 25 mg,rosuvastatin 20 mg,Janumet 50-1000 MG  Reviewed chart prior to disease state call. Spoke with patient regarding BP  Recent Office Vitals: BP Readings from Last 3 Encounters:  08/29/20 128/82  02/26/20 128/82  11/05/19 136/81   Pulse Readings from Last 3 Encounters:  08/29/20 93  02/26/20 (!) 115  11/05/19 91    Wt Readings from Last 3 Encounters:  08/29/20 149 lb 1.6 oz (67.6 kg)  02/26/20 153 lb 8 oz (69.6 kg)  11/05/19 152 lb (68.9 kg)     Kidney Function Lab Results  Component Value Date/Time   CREATININE 1.00 (H) 08/29/2020 03:43 PM   CREATININE 1.02 (H) 02/26/2020 04:30 PM   GFRNONAA 57 (L) 08/29/2020 03:43 PM   GFRAA 66 08/29/2020 03:43 PM    BMP Latest Ref Rng & Units 08/29/2020 02/26/2020 11/05/2019  Glucose 65 - 139 mg/dL 149(H) 121(H) 170(H)  BUN 7 - 25 mg/dL 11 23 20   Creatinine 0.60 - 0.93 mg/dL 1.00(H) 1.02(H) 1.02(H)  BUN/Creat Ratio 6 - 22 (calc) 11 23(H) -  Sodium 135 - 146 mmol/L  142 141 141  Potassium 3.5 - 5.3 mmol/L 3.7 4.2 3.7  Chloride 98 - 110 mmol/L 104 102 105  CO2 20 - 32 mmol/L 30 29 26   Calcium 8.6 - 10.4 mg/dL 10.7(H) 10.0 9.8    . Current antihypertensive regimen:   Carvedilol 12.5 mg twice daily   Losartan 25 mg daily  . How often are you checking your Blood Pressure? infrequently . Current home BP readings:  o Patient daughter states patient blood pressure ranges "Normal". o Patient daughter states patient denies headaches,lightheadedness or dizzness. . What recent interventions/DTPs have been made by any provider to improve Blood Pressure control since last CPP Visit: None ID . Any recent hospitalizations or ED visits since last visit with CPP? No . What diet changes have been made  to improve Blood Pressure Control?  o None ID  . What exercise is being done to improve your Blood Pressure Control?  o Patient daughter reports patient walks 2 to 3 times a week down the road around her house.  Adherence Review: Is the patient currently on ACE/ARB medication? Yes Does the patient have >5 day gap between last estimated fill dates? Yes   [place] Pharmacist Assistant [phone]

## 2021-01-11 DIAGNOSIS — R413 Other amnesia: Secondary | ICD-10-CM | POA: Diagnosis not present

## 2021-01-20 ENCOUNTER — Encounter: Payer: Self-pay | Admitting: Family Medicine

## 2021-02-01 ENCOUNTER — Encounter: Payer: Self-pay | Admitting: Family Medicine

## 2021-02-01 ENCOUNTER — Ambulatory Visit (INDEPENDENT_AMBULATORY_CARE_PROVIDER_SITE_OTHER): Payer: Medicare Other | Admitting: Family Medicine

## 2021-02-01 ENCOUNTER — Other Ambulatory Visit: Payer: Self-pay

## 2021-02-01 VITALS — BP 142/86 | HR 99 | Temp 98.0°F | Resp 14 | Ht 59.0 in | Wt 148.4 lb

## 2021-02-01 DIAGNOSIS — Z7902 Long term (current) use of antithrombotics/antiplatelets: Secondary | ICD-10-CM

## 2021-02-01 DIAGNOSIS — J310 Chronic rhinitis: Secondary | ICD-10-CM

## 2021-02-01 DIAGNOSIS — R040 Epistaxis: Secondary | ICD-10-CM

## 2021-02-01 DIAGNOSIS — I10 Essential (primary) hypertension: Secondary | ICD-10-CM | POA: Diagnosis not present

## 2021-02-01 DIAGNOSIS — E611 Iron deficiency: Secondary | ICD-10-CM | POA: Diagnosis not present

## 2021-02-01 NOTE — Progress Notes (Signed)
Patient ID: Lindsey [name], female    DOB: 05/08/1951, 70 y.o.   MRN: [id number]  PCP: Steele Sizer, MD  Chief Complaint  Patient presents with  . Epistaxis    Nose bleed for 2/3weeks    Subjective:   Lindsey [name] is a 70 y.o. female, presents to clinic with CC of the following:  HPI  Pt presents with over a month of intermittent nose bleeds.  Pt is able to given limited hx and aid with her is not her primary caregiver or aid.  She states nose bleeds are a few times a week, they do not last very long, she has not had to be evaluated for them, she does not have to hold her nose or do meds to get them to stop.  Not sure which nostril they are out of.   They sent a message to PCP a few weeks ago and were advised to add saline nasal spray and frequency of bleeds has improved.  A lot less over the past 2 weeks She does have some nasal allergy sx, has drainage and is sniffing here today.  Not on allergy meds or steroid nasal sprays. She denies any lightheadedness, weakness, DOE, pallor  BP was elevated today and when repeated remained a little elevated BP Readings from Last 3 Encounters:  02/01/21 (!) 142/86  08/29/20 128/82  02/26/20 128/82  reviewed flowsheet and VS, SBP ranges 110-140's over the past several months.  She has BP cuff at home and ability for it to be checked at her residence.  They have not had high readings at home. Pt denies HA, sinus pain/pressure, eye pain, visual disturbances, CP  She does take ASA daily but denies any hx of MI or stroke.  No other bleeding noted, no bruising, petechia, spontaneous bruising, hematuria, melena    Patient Active Problem List   Diagnosis Date Noted  . Pain due to onychomycosis of toenails of both feet 05/04/2019  . 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 Mild dementia ([place]) 11/01/2015  . Auditory hallucination 07/26/2015  . Anemia of chronic disease 07/05/2015  . Cataract 04/06/2015  . Type  2 diabetes mellitus with diabetic cataract ([place]) 04/06/2015  . Benign essential HTN 04/02/2015  . Controlled gout 04/02/2015  . Dyslipidemia 04/02/2015  . Depression, major, recurrent, mild ([place]) 04/02/2015  . Fibromyalgia syndrome 04/02/2015  . Chronic insomnia 04/02/2015  . Cognitive decline 04/02/2015  . Nodular basal cell carcinoma 04/02/2015  . Morbid obesity ([place]) 04/02/2015  . Osteopenia 04/02/2015  . Calcium blood increased 04/02/2015      Current Outpatient Medications:  .  alendronate (FOSAMAX) 70 MG tablet, Take with a full glass of water on an empty stomach., Disp: 12 tablet, Rfl: 1 .  allopurinol (ZYLOPRIM) 100 MG tablet, Take 1 tablet (100 mg total) by mouth 2 (two) times daily., Disp: 180 tablet, Rfl: 1 .  aspirin 81 MG chewable tablet, Chew 1 tablet by mouth daily., Disp: , Rfl:  .  carvedilol (COREG) 12.5 MG tablet, Take 1 tablet (12.5 mg total) by mouth 2 (two) times daily with a meal., Disp: 180 tablet, Rfl: 1 .  donepezil (ARICEPT) 10 MG tablet, Take 1 tablet (10 mg total) by mouth at bedtime. (Patient taking differently: Take 10 mg by mouth daily. In the morning), Disp: 90 tablet, Rfl: 1 .  empagliflozin (JARDIANCE) 25 MG TABS tablet, Take 1 tablet (25 mg total) by mouth daily., Disp: 90 tablet, Rfl: 1 .  glipiZIDE (GLUCOTROL XL) 2.5 MG 24 hr tablet, Take 1 tablet (2.5 mg total) by mouth daily with breakfast., Disp: 90 tablet, Rfl: 1 .  GLUCOSE BLOOD VI, , Disp: , Rfl:  .  icosapent Ethyl (VASCEPA) 1 g capsule, Take 1 capsule (1 g total) by mouth 2 (two) times daily., Disp: 180 capsule, Rfl: 1 .  losartan (COZAAR) 25 MG tablet, Take 1 tablet (25 mg total) by mouth daily., Disp: 90 tablet, Rfl: 1 .  Melatonin 5 MG CAPS, Take 5 mg by mouth at bedtime. , Disp: , Rfl:  .  memantine (NAMENDA) 10 MG tablet, Take 10 mg by mouth 2 (two) times daily., Disp: , Rfl:  .  MULTIPLE VITAMIN PO, Take 1 tablet by mouth daily., Disp: , Rfl:  .  QUEtiapine (SEROQUEL) 100 MG tablet,  takes 50 mg in the morning, 150 mg in the evening, and 100 mg at night., Disp: , Rfl:  .  rosuvastatin (CRESTOR) 20 MG tablet, Take 1 tablet by mouth once daily, Disp: 90 tablet, Rfl: 0 .  SitaGLIPtin-MetFORMIN HCl (JANUMET XR) 50-1000 MG TB24, Take 2 tablets by mouth daily., Disp: 180 tablet, Rfl: 1 .  VITAMIN D PO, Take 1,000 mg by mouth daily. , Disp: , Rfl:    Allergies  Allergen Reactions  . Ace Inhibitors      Social History   Tobacco Use  . Smoking status: Never Smoker  . Smokeless tobacco: Never Used  . Tobacco comment: smoking cessation materials not required  Vaping Use  . Vaping Use: Never used  Substance Use Topics  . Alcohol use: No    Alcohol/week: 0.0 standard drinks  . Drug use: No      Chart Review Today: I personally reviewed active problem list, medication list, allergies, family history, social history, health maintenance, notes from last encounter, lab results, imaging with the patient/caregiver today.   Review of Systems  Unable to perform ROS: Dementia       Objective:   Vitals:   02/01/21 0852 02/01/21 0903  BP: (!) 160/94 (!) 142/86  Pulse: 99   Resp: 14   Temp: 98 F (36.7 C)   SpO2: 98%   Weight: 148 lb 6.4 oz (67.3 kg)   Height: 4\' 11"  (1.499 m)     Body mass index is 29.97 kg/m.  Physical Exam Vitals and nursing note reviewed.  Constitutional:      General: She is not in acute distress.    Appearance: Normal appearance. She is well-developed. She is not ill-appearing, toxic-appearing or diaphoretic.     Interventions: Face mask in place.  HENT:     Head: Normocephalic and atraumatic.     Right Ear: Tympanic membrane, ear canal and external ear normal. There is no impacted cerumen.     Left Ear: Tympanic membrane, ear canal and external ear normal. There is no impacted cerumen.     Nose: Mucosal edema present.     Right Nostril: No epistaxis, septal hematoma or occlusion.     Left Nostril: No epistaxis, septal hematoma or  occlusion.     Right Turbinates: Swollen. Not pale.     Left Turbinates: Swollen. Not pale.     Right Sinus: No maxillary sinus tenderness or frontal sinus tenderness.     Left Sinus: No maxillary sinus tenderness or frontal sinus tenderness.     Mouth/Throat:     Mouth: Mucous membranes are moist.     Pharynx: Oropharynx is clear. No oropharyngeal exudate or posterior  oropharyngeal erythema.     Comments: No intraoral pallor Eyes:     General: Lids are normal. No scleral icterus.       Right eye: No discharge.        Left eye: No discharge.     Conjunctiva/sclera: Conjunctivae normal.  Neck:     Trachea: Phonation normal. No tracheal deviation.  Cardiovascular:     Rate and Rhythm: Normal rate and regular rhythm.     Pulses: Normal pulses.          Radial pulses are 2+ on the right side and 2+ on the left side.       Posterior tibial pulses are 2+ on the right side and 2+ on the left side.     Heart sounds: Normal heart sounds. No murmur heard. No friction rub. No gallop.   Pulmonary:     Effort: Pulmonary effort is normal. No respiratory distress.     Breath sounds: Normal breath sounds. No stridor. No wheezing, rhonchi or rales.  Chest:     Chest wall: No tenderness.  Abdominal:     General: Bowel sounds are normal. There is no distension.  Musculoskeletal:     Right lower leg: No edema.     Left lower leg: No edema.  Skin:    General: Skin is warm and dry.     Coloration: Skin is not jaundiced or pale.     Findings: No rash.  Neurological:     Mental Status: She is alert.     Motor: No abnormal muscle tone.     Gait: Gait normal.  Psychiatric:        Mood and Affect: Mood normal.        Speech: Speech normal.        Behavior: Behavior normal.      Results for orders placed or performed in visit on 08/29/20  Lipid panel  Result Value Ref Range   Cholesterol 102 <200 mg/dL   HDL 39 (L) > OR = 50 mg/dL   Triglycerides 101 <150 mg/dL   LDL Cholesterol (Calc) 45  mg/dL (calc)   Total CHOL/HDL Ratio 2.6 <5.0 (calc)   Non-HDL Cholesterol (Calc) 63 <130 mg/dL (calc)  Microalbumin / creatinine urine ratio  Result Value Ref Range   Creatinine, Urine 63 20 - 275 mg/dL   Microalb, Ur 17.9 mg/dL   Microalb Creat Ratio 284 (H) <30 mcg/mg creat  COMPLETE METABOLIC PANEL WITH GFR  Result Value Ref Range   Glucose, Bld 149 (H) 65 - 139 mg/dL   BUN 11 7 - 25 mg/dL   Creat 1.00 (H) 0.60 - 0.93 mg/dL   GFR, Est Non African American 57 (L) > OR = 60 mL/min/1.93m2   GFR, Est African American 66 > OR = 60 mL/min/1.47m2   BUN/Creatinine Ratio 11 6 - 22 (calc)   Sodium 142 135 - 146 mmol/L   Potassium 3.7 3.5 - 5.3 mmol/L   Chloride 104 98 - 110 mmol/L   CO2 30 20 - 32 mmol/L   Calcium 10.7 (H) 8.6 - 10.4 mg/dL   Total Protein 7.0 6.1 - 8.1 g/dL   Albumin 4.8 3.6 - 5.1 g/dL   Globulin 2.2 1.9 - 3.7 g/dL (calc)   AG Ratio 2.2 1.0 - 2.5 (calc)   Total Bilirubin 0.6 0.2 - 1.2 mg/dL   Alkaline phosphatase (APISO) 69 37 - 153 U/L   AST 20 10 - 35 U/L   ALT 17 6 - 29  U/L  CBC with Differential/Platelet  Result Value Ref Range   WBC 9.3 3.8 - 10.8 Thousand/uL   RBC 4.58 3.80 - 5.10 Million/uL   Hemoglobin 13.1 11.7 - 15.5 g/dL   HCT 39.8 35.0 - 45.0 %   MCV 86.9 80.0 - 100.0 fL   MCH 28.6 27.0 - 33.0 pg   MCHC 32.9 32.0 - 36.0 g/dL   RDW 13.8 11.0 - 15.0 %   Platelets 253 140 - 400 Thousand/uL   MPV 12.3 7.5 - 12.5 fL   Neutro Abs 5,413 1,500 - 7,800 cells/uL   Lymphs Abs 2,948 850 - 3,900 cells/uL   Absolute Monocytes 725 200 - 950 cells/uL   Eosinophils Absolute 149 15 - 500 cells/uL   Basophils Absolute 65 0 - 200 cells/uL   Neutrophils Relative % 58.2 %   Total Lymphocyte 31.7 %   Monocytes Relative 7.8 %   Eosinophils Relative 1.6 %   Basophils Relative 0.7 %  TSH  Result Value Ref Range   TSH 3.67 0.40 - 4.50 mIU/L  VITAMIN D 25 Hydroxy (Vit-D Deficiency, Fractures)  Result Value Ref Range   Vit D, 25-Hydroxy 58 30 - 100 ng/mL  Iron, TIBC  and Ferritin Panel  Result Value Ref Range   Iron 46 45 - 160 mcg/dL   TIBC 451 (H) 250 - 450 mcg/dL (calc)   %SAT 10 (L) 16 - 45 % (calc)   Ferritin 34 16 - 288 ng/mL  POCT HgB A1C  Result Value Ref Range   Hemoglobin A1C 6.8 (A) 4.0 - 5.6 %   HbA1c POC (<> result, manual entry)     HbA1c, POC (prediabetic range)     HbA1c, POC (controlled diabetic range)         Assessment & Plan:      ICD-10-CM   1. Nosebleed  R04.0    no active bleed or signs of bleeding on exam, bilateral nasal mucosa edema and erythema, no sinus ttp, see plan below -   2. Primary hypertension  I10    BP elevated today, improved a little with repeat BP, asx, monitor at home, f/up if >140/90 on average  3. Iron deficiency  E61.1    last labs reviewed, no anemia but iron deficiency, offered to check CBC but pt and aid declined today, said she has labs next month with PCP  4. Encounter for long-term use of antiplatelets/antithrombotics  Z79.02    primary prevention, pt and aid deny hx of CVA or MI, reduce ASA dose frequency for now with recurrent nose bleeds, f/up pcp next month  5. Rhinitis, unspecified type  J31.0    start 2nd gen antihistamine and continue saline nasal spray     Plan: Continue using saline nasal spray daily  Start a daily antihistamine once in the evening for nasal allergies - like generic zyrtec, allegra or claritin  Reduce your aspirin dose to every other day or 3 days a week   If you have any continued nose bleeds over the next 2 weeks please let us know so we can refer you to ENT specialists  Check your blood pressure at home 2x a day for the next 1-2 weeks to make sure it is not often elevated - notify us if on average it is over 140/90  f/up in 1-2 weeks if sx not improved or if BP elevated   Delsa Grana, PA-C 02/01/21 9:16 AM

## 2021-02-01 NOTE — Patient Instructions (Signed)
Continue using saline nasal spray daily  Start a daily antihistamine once in the evening for nasal allergies - like generic zyrtec, allegra or claritin  Reduce your aspirin dose to every other day or 3 days a week   If you have any continued nose bleeds over the next 2 weeks please let us know so we can refer you to ENT specialists  Check your blood pressure at home 2x a day for the next 1-2 weeks to make sure it is not often elevated - notify us if on average it is over 140/90   Nosebleed, Adult A nosebleed is when blood comes out of the nose. Nosebleeds are common and can be caused by many things. They are usually not a sign of a serious medical problem. Follow these instructions at home: When you have a nosebleed:  Sit down.  Tilt your head a little forward.  Follow these steps: 1. Pinch your nose with a clean towel or tissue. 2. Keep pinching your nose for 5 minutes. Do not let go. 3. After 5 minutes, let go of your nose. 4. If there is still bleeding, do these steps again. Keep doing these steps until the bleeding stops.  Do not put tissues or other things in your nose to stop the bleeding.  Avoid lying down or putting your head back.  Use a nose spray decongestant as told by your doctor.   After a nosebleed:  Try not to blow your nose or sniffle for several hours.  Try not to strain, lift, or bend at the waist for several days.  Aspirin and blood-thinning medicines make bleeding more likely. If you take these medicines: ? Ask your doctor if you should stop taking them or if you should change how much you take. ? Do not stop taking the medicine unless your doctor tells you to.  If your nosebleed was caused by dryness, use over-the-counter saline nasal spray or gel and humidifier as told by your doctor. This will keep the inside of your nose moist and allow it to heal. If you need to use one of these products: ? Choose one that is water-soluble. ? Use only as much as  you need and use it only as often as needed. ? Do not lie down right away after you use it.  If you get nosebleeds often, talk with your doctor about treatments. These may include: ? Nasal cautery. A chemical swab or electrical device is used to lightly burn tiny blood vessels inside the nose. This helps stop or prevent nosebleeds. ? Nasal packing. A gauze or other material is placed in the nose to keep constant pressure on the bleeding area. Contact a doctor if:  You have a fever.  You get nosebleeds often.  You are getting nosebleeds more often than usual.  You bruise very easily.  You have something stuck in your nose.  You have bleeding in your mouth.  You vomit or cough up brown material.  You get a nosebleed after you start a new medicine. Get help right away if:  You have a nosebleed after you fall or hurt your head.  Your nosebleed does not go away after 20 minutes.  You feel dizzy or weak.  You have unusual bleeding from other parts of your body.  You have unusual bruising on other parts of your body.  You get sweaty.  You vomit blood. Summary  Nosebleeds are common. They are usually not a sign of a serious medical problem.  When you have a nosebleed, sit down and tilt your head a little forward. Pinch your nose with a clean tissue for 5 minutes.  Use saline spray or saline gel and a humidifier as told by your doctor.  Get help right away if your nosebleed does not go away after 20 minutes. This information is not intended to replace advice given to you by your health care provider. Make sure you discuss any questions you have with your health care provider. Document Revised: 07/30/2019 Document Reviewed: 07/30/2019 Elsevier Patient Education  2021 [employer].

## 2021-02-06 DIAGNOSIS — E119 Type 2 diabetes mellitus without complications: Secondary | ICD-10-CM | POA: Diagnosis not present

## 2021-02-06 LAB — HM DIABETES EYE EXAM

## 2021-02-11 ENCOUNTER — Other Ambulatory Visit: Payer: Self-pay | Admitting: Family Medicine

## 2021-02-11 DIAGNOSIS — E785 Hyperlipidemia, unspecified: Secondary | ICD-10-CM

## 2021-02-11 NOTE — Telephone Encounter (Signed)
Requested Prescriptions  Pending Prescriptions Disp Refills  . rosuvastatin (CRESTOR) 20 MG tablet [Pharmacy Med Name: Rosuvastatin Calcium 20 MG Oral Tablet] 90 tablet 0    Sig: Take 1 tablet by mouth once daily     Cardiovascular:  Antilipid - Statins Failed - 02/11/2021  9:06 AM      Failed - HDL in normal range and within 360 days    HDL  Date Value Ref Range Status  08/29/2020 39 (L) > OR = 50 mg/dL Final  12/15/2015 47 >39 mg/dL Final         Passed - Total Cholesterol in normal range and within 360 days    Cholesterol, Total  Date Value Ref Range Status  12/15/2015 135 100 - 199 mg/dL Final   Cholesterol  Date Value Ref Range Status  08/29/2020 102 <200 mg/dL Final         Passed - LDL in normal range and within 360 days    LDL Cholesterol (Calc)  Date Value Ref Range Status  08/29/2020 45 mg/dL (calc) Final    Comment:    Reference range: <100 . Desirable range <100 mg/dL for primary prevention;   <70 mg/dL for patients with CHD or diabetic patients  with > or = 2 CHD risk factors. Marland Kitchen LDL-C is now calculated using the Martin-Hopkins  calculation, which is a validated novel method providing  better accuracy than the Friedewald equation in the  estimation of LDL-C.  Cresenciano Genre et al. Annamaria Helling. 6226;333(54): 2061-2068  (http://education.[employer].com/faq/FAQ164)          Passed - Triglycerides in normal range and within 360 days    Triglycerides  Date Value Ref Range Status  08/29/2020 101 <150 mg/dL Final         Passed - Patient is not pregnant      Passed - Valid encounter within last 12 months    Recent Outpatient Visits          1 week ago [facility] [place], Kristeen Miss, PA-C   5 months ago Type 2 diabetes mellitus with microalbuminuria, without long-term current use of insulin [facility])   [facility] [place], Drue Stager, MD   11 months ago Type 2 diabetes mellitus with microalbuminuria, without long-term  current use of insulin [facility])   [facility] [place], Drue Stager, MD   1 year ago Type 2 diabetes mellitus with microalbuminuria, without long-term current use of insulin [facility])   [facility] [place], Drue Stager, MD   1 year ago Type 2 diabetes mellitus with microalbuminuria, without long-term current use of insulin [facility])   [facility] Steele Sizer, MD      Future Appointments            In 2 weeks Ancil Boozer, Drue Stager, MD [facility], [place]   In 5 months  [facility], [facility]

## 2021-03-01 ENCOUNTER — Ambulatory Visit: Payer: Medicare Other | Admitting: Family Medicine

## 2021-03-12 ENCOUNTER — Other Ambulatory Visit: Payer: Self-pay | Admitting: Family Medicine

## 2021-03-12 DIAGNOSIS — R809 Proteinuria, unspecified: Secondary | ICD-10-CM

## 2021-03-12 DIAGNOSIS — E1129 Type 2 diabetes mellitus with other diabetic kidney complication: Secondary | ICD-10-CM

## 2021-03-12 NOTE — Telephone Encounter (Signed)
Requested Prescriptions  Pending Prescriptions Disp Refills  . losartan (COZAAR) 25 MG tablet [Pharmacy Med Name: Losartan Potassium 25 MG Oral Tablet] 90 tablet 0    Sig: Take 1 tablet by mouth once daily     Cardiovascular:  Angiotensin Receptor Blockers Failed - 03/12/2021  5:04 PM      Failed - Cr in normal range and within 180 days    Creat  Date Value Ref Range Status  08/29/2020 1.00 (H) 0.60 - 0.93 mg/dL Final    Comment:    For patients >64 years of age, the reference limit for Creatinine is approximately 13% higher for people identified as African-American. .    Creatinine, Urine  Date Value Ref Range Status  08/29/2020 63 20 - 275 mg/dL Final         Failed - K in normal range and within 180 days    Potassium  Date Value Ref Range Status  08/29/2020 3.7 3.5 - 5.3 mmol/L Final         Failed - Last BP in normal range    BP Readings from Last 1 Encounters:  02/01/21 (!) 142/86         Passed - Patient is not pregnant      Passed - Valid encounter within last 6 months    Recent Outpatient Visits          1 month ago [facility] [place], Kristeen Miss, PA-C   6 months ago Type 2 diabetes mellitus with microalbuminuria, without long-term current use of insulin [facility])   [facility] [place], Drue Stager, MD   1 year ago Type 2 diabetes mellitus with microalbuminuria, without long-term current use of insulin [facility])   [facility] [place], Drue Stager, MD   1 year ago Type 2 diabetes mellitus with microalbuminuria, without long-term current use of insulin [facility])   [facility] [place], Drue Stager, MD   1 year ago Type 2 diabetes mellitus with microalbuminuria, without long-term current use of insulin [facility])   [facility] Steele Sizer, MD      Future Appointments            In 1 month Ancil Boozer, Drue Stager, MD [facility], [place]   In 4 months  [place]

## 2021-03-18 ENCOUNTER — Other Ambulatory Visit: Payer: Self-pay | Admitting: Family Medicine

## 2021-03-18 DIAGNOSIS — M109 Gout, unspecified: Secondary | ICD-10-CM

## 2021-03-18 DIAGNOSIS — E785 Hyperlipidemia, unspecified: Secondary | ICD-10-CM

## 2021-03-18 DIAGNOSIS — E1129 Type 2 diabetes mellitus with other diabetic kidney complication: Secondary | ICD-10-CM

## 2021-03-18 NOTE — Telephone Encounter (Signed)
Requested Prescriptions  Pending Prescriptions Disp Refills  . glipiZIDE (GLUCOTROL XL) 2.5 MG 24 hr tablet [Pharmacy Med Name: glipiZIDE ER 2.5 MG Oral Tablet Extended Release 24 Hour] 90 tablet 0    Sig: Take 1 tablet by mouth once daily with breakfast     Endocrinology:  Diabetes - Sulfonylureas Failed - 03/18/2021  3:36 PM      Failed - HBA1C is between 0 and 7.9 and within 180 days    Hemoglobin A1C  Date Value Ref Range Status  08/29/2020 6.8 (A) 4.0 - 5.6 % Final   HbA1c, POC (controlled diabetic range)  Date Value Ref Range Status  04/22/2019 7.4 (A) 0.0 - 7.0 % Final   Hgb A1c MFr Bld  Date Value Ref Range Status  10/30/2019 6.9 (H) <5.7 % of total Hgb Final    Comment:    For someone without known diabetes, a hemoglobin A1c value of 6.5% or greater indicates that they may have  diabetes and this should be confirmed with a follow-up  test. . For someone with known diabetes, a value <7% indicates  that their diabetes is well controlled and a value  greater than or equal to 7% indicates suboptimal  control. A1c targets should be individualized based on  duration of diabetes, age, comorbid conditions, and  other considerations. . Currently, no consensus exists regarding use of hemoglobin A1c for diagnosis of diabetes for children. Renella Cunas - Valid encounter within last 6 months    Recent Outpatient Visits          1 month ago [facility] Delsa Grana, PA-C   6 months ago Type 2 diabetes mellitus with microalbuminuria, without long-term current use of insulin [facility])   [facility] [place], Drue Stager, MD   1 year ago Type 2 diabetes mellitus with microalbuminuria, without long-term current use of insulin [facility])   [facility] [place], Drue Stager, MD   1 year ago Type 2 diabetes mellitus with microalbuminuria, without long-term current use of insulin [facility])   [facility] [place],  Drue Stager, MD   1 year ago Type 2 diabetes mellitus with microalbuminuria, without long-term current use of insulin [facility])   [facility] Steele Sizer, MD      Future Appointments            In 1 month Ancil Boozer, Drue Stager, MD [facility], [place]   In 3 months  [facility] [place]           . allopurinol (ZYLOPRIM) 100 MG tablet [Pharmacy Med Name: Allopurinol 100 MG Oral Tablet] 180 tablet 0    Sig: Take 1 tablet by mouth twice daily     Endocrinology:  Gout Agents Failed - 03/18/2021  3:36 PM      Failed - Uric Acid in normal range and within 360 days    Uric Acid  Date Value Ref Range Status  12/15/2015 8.1 (H) 2.5 - 7.1 mg/dL Final    Comment:               Therapeutic target for gout patients: <6.0         Failed - Cr in normal range and within 360 days    Creat  Date Value Ref Range Status  08/29/2020 1.00 (H) 0.60 - 0.93 mg/dL Final    Comment:    For patients >49 years of  age, the reference limit for Creatinine is approximately 13% higher for people identified as African-American. .    Creatinine, Urine  Date Value Ref Range Status  08/29/2020 63 20 - 275 mg/dL Final         Passed - Valid encounter within last 12 months    Recent Outpatient Visits          1 month ago [facility] Delsa Grana, PA-C   6 months ago Type 2 diabetes mellitus with microalbuminuria, without long-term current use of insulin [facility])   [facility] [place], Drue Stager, MD   1 year ago Type 2 diabetes mellitus with microalbuminuria, without long-term current use of insulin [facility])   [facility] [place], Drue Stager, MD   1 year ago Type 2 diabetes mellitus with microalbuminuria, without long-term current use of insulin [facility])   [facility] [place], Drue Stager, MD   1 year ago Type 2 diabetes mellitus with microalbuminuria, without long-term current use of insulin  [facility])   [facility] Steele Sizer, MD      Future Appointments            In 1 month Ancil Boozer, Drue Stager, MD [facility] [place]   In 3 months  [facility] [place]           . JARDIANCE 25 MG TABS tablet [Pharmacy Med Name: Jardiance 25 MG Oral Tablet] 90 tablet 0    Sig: Take 1 tablet by mouth once daily     Endocrinology:  Diabetes - SGLT2 Inhibitors Failed - 03/18/2021  3:36 PM      Failed - Cr in normal range and within 360 days    Creat  Date Value Ref Range Status  08/29/2020 1.00 (H) 0.60 - 0.93 mg/dL Final    Comment:    For patients >100 years of age, the reference limit for Creatinine is approximately 13% higher for people identified as African-American. .    Creatinine, Urine  Date Value Ref Range Status  08/29/2020 63 20 - 275 mg/dL Final         Failed - HBA1C is between 0 and 7.9 and within 180 days    Hemoglobin A1C  Date Value Ref Range Status  08/29/2020 6.8 (A) 4.0 - 5.6 % Final   HbA1c, POC (controlled diabetic range)  Date Value Ref Range Status  04/22/2019 7.4 (A) 0.0 - 7.0 % Final   Hgb A1c MFr Bld  Date Value Ref Range Status  10/30/2019 6.9 (H) <5.7 % of total Hgb Final    Comment:    For someone without known diabetes, a hemoglobin A1c value of 6.5% or greater indicates that they may have  diabetes and this should be confirmed with a follow-up  test. . For someone with known diabetes, a value <7% indicates  that their diabetes is well controlled and a value  greater than or equal to 7% indicates suboptimal  control. A1c targets should be individualized based on  duration of diabetes, age, comorbid conditions, and  other considerations. . Currently, no consensus exists regarding use of hemoglobin A1c for diagnosis of diabetes for children. .          Passed - LDL in normal range and within 360 days    LDL Cholesterol (Calc)  Date Value Ref Range Status  08/29/2020 45 mg/dL  (calc) Final    Comment:    Reference range: <100 . Desirable  range <100 mg/dL for primary prevention;   <70 mg/dL for patients with CHD or diabetic patients  with > or = 2 CHD risk factors. Marland Kitchen LDL-C is now calculated using the Martin-Hopkins  calculation, which is a validated novel method providing  better accuracy than the Friedewald equation in the  estimation of LDL-C.  Cresenciano Genre et al. Annamaria Helling. 1694;503(88): 2061-2068  (http://education.[employer].com/faq/FAQ164)          Passed - AA eGFR in normal range and within 360 days    GFR, Est African American  Date Value Ref Range Status  08/29/2020 66 > OR = 60 mL/min/1.34m Final   GFR, Est Non African American  Date Value Ref Range Status  08/29/2020 57 (L) > OR = 60 mL/min/1.746mFinal         Passed - Valid encounter within last 6 months    Recent Outpatient Visits          1 month [facility]   6 months ago Type 2 diabetes mellitus with microalbuminuria, without long-term current use of insulin ([facility]  CHOak[facility] CenteroDelanoKrDrue StagerMD   1 year ago Type 2 diabetes mellitus with microalbuminuria, without long-term current use of insulin ([facility]  CHHanover Medical CenteroMine La MotteKrDrue StagerMD   1 year ago Type 2 diabetes mellitus with microalbuminuria, without long-term current use of insulin ([facility]  CHJena Medical CenteroGrahamKrDrue StagerMD   1 year ago Type 2 diabetes mellitus with microalbuminuria, without long-term current use of insulin ([facility]  CHOslo Medical CenteroSteele SizerMD      Future Appointments            In 1 month SoSteele SizerMD [place] In 3 months  [facility]           . icosapent Ethyl (VASCEPA) 1 g capsule [Pharmacy Med Name: Icosapent Ethyl 1 GM Oral Capsule] 180 capsule     Sig: Take 1 capsule by mouth twice daily      Off-Protocol Failed - 03/18/2021  3:36 PM      Failed - Medication not assigned to a protocol, review manually.      Passed - Valid encounter within last 12 months    Recent Outpatient Visits          1 month ago NoHoop[facility] CenteraIgiugigLeKristeen MissPA-C   6 months ago Type 2 diabetes mellitus with microalbuminuria, without long-term current use of insulin ([facility]  CHBlackhawk Medical CenteroLewistown HeightsKrDrue StagerMD   1 year ago Type 2 diabetes mellitus with microalbuminuria, without long-term current use of insulin ([facility]  CHElsinore Medical CenteroSprayKrDrue StagerMD   1 year ago Type 2 diabetes mellitus with microalbuminuria, without long-term current use of insulin ([facility]  CHSherman Medical CenteroLansingKrDrue StagerMD   1 year ago Type 2 diabetes mellitus with microalbuminuria, without long-term current use of insulin (HOut[facility] Care[facility]  CHPhilomath Medical CenteroSteele SizerMD      Future Appointments            In 1 month SoAncil BoozerKrDrue StagerMD CHMercy Memorial HospitalPEMoore Haven In 3 months  CHCarthage [facility]PE[facility]

## 2021-03-18 NOTE — Telephone Encounter (Signed)
Requested medication (s) are due for refill today: yes  Requested medication (s) are on the active medication list: yes  Last refill:  08/29/20  Future visit scheduled: yes  Notes to clinic:  med not assigned to a protocol   Requested Prescriptions  Pending Prescriptions Disp Refills   icosapent Ethyl (VASCEPA) 1 g capsule [Pharmacy Med Name: Icosapent Ethyl 1 GM Oral Capsule] 180 capsule     Sig: Take 1 capsule by mouth twice daily      Off-Protocol Failed - 03/18/2021  3:36 PM      Failed - Medication not assigned to a protocol, review manually.      Passed - Valid encounter within last 12 months    Recent Outpatient Visits           1 month ago [facility] Delsa Grana, PA-C   6 months ago Type 2 diabetes mellitus with microalbuminuria, without long-term current use of insulin [facility])   [facility] Steele Sizer, MD   1 year ago Type 2 diabetes mellitus with microalbuminuria, without long-term current use of insulin [facility])   [facility] Steele Sizer, MD   1 year ago Type 2 diabetes mellitus with microalbuminuria, without long-term current use of insulin [facility])   [facility] Steele Sizer, MD   1 year ago Type 2 diabetes mellitus with microalbuminuria, without long-term current use of insulin [facility])   [facility] Steele Sizer, MD       Future Appointments             In 1 month Steele Sizer, MD [facility] [place]   In 3 months  [facility]              Signed Prescriptions Disp Refills   glipiZIDE (GLUCOTROL XL) 2.5 MG 24 hr tablet 90 tablet 0    Sig: Take 1 tablet by mouth once daily with breakfast      Endocrinology:  Diabetes - Sulfonylureas Failed - 03/18/2021  3:36 PM      Failed - HBA1C is between 0 and 7.9 and within 180 days    Hemoglobin A1C  Date Value Ref Range Status  08/29/2020 6.8 (A)  4.0 - 5.6 % Final   HbA1c, POC (controlled diabetic range)  Date Value Ref Range Status  04/22/2019 7.4 (A) 0.0 - 7.0 % Final   Hgb A1c MFr Bld  Date Value Ref Range Status  10/30/2019 6.9 (H) <5.7 % of total Hgb Final    Comment:    For someone without known diabetes, a hemoglobin A1c value of 6.5% or greater indicates that they may have  diabetes and this should be confirmed with a follow-up  test. . For someone with known diabetes, a value <7% indicates  that their diabetes is well controlled and a value  greater than or equal to 7% indicates suboptimal  control. A1c targets should be individualized based on  duration of diabetes, age, comorbid conditions, and  other considerations. . Currently, no consensus exists regarding use of hemoglobin A1c for diagnosis of diabetes for children. Renella Cunas - Valid encounter within last 6 months    Recent Outpatient Visits           1 month ago [facility] [place], Kristeen Miss, PA-C   6 months ago Type 2 diabetes mellitus  with microalbuminuria, without long-term current use of insulin [facility])   [facility] Steele Sizer, MD   1 year ago Type 2 diabetes mellitus with microalbuminuria, without long-term current use of insulin [facility])   [facility] Steele Sizer, MD   1 year ago Type 2 diabetes mellitus with microalbuminuria, without long-term current use of insulin [facility])   [facility] Steele Sizer, MD   1 year ago Type 2 diabetes mellitus with microalbuminuria, without long-term current use of insulin [facility])   [facility] Steele Sizer, MD       Future Appointments             In 1 month Steele Sizer, MD [place]   In 3 months  [facility]               allopurinol (ZYLOPRIM) 100 MG tablet 180 tablet 0    Sig: Take 1 tablet by mouth twice daily       Endocrinology:  Gout Agents Failed - 03/18/2021  3:36 PM      Failed - Uric Acid in normal range and within 360 days    Uric Acid  Date Value Ref Range Status  12/15/2015 8.1 (H) 2.5 - 7.1 mg/dL Final    Comment:               Therapeutic target for gout patients: <6.0          Failed - Cr in normal range and within 360 days    Creat  Date Value Ref Range Status  08/29/2020 1.00 (H) 0.60 - 0.93 mg/dL Final    Comment:    For patients >71 years of age, the reference limit for Creatinine is approximately 13% higher for people identified as African-American. .    Creatinine, Urine  Date Value Ref Range Status  08/29/2020 63 20 - 275 mg/dL Final          Passed - Valid encounter within last 12 months    Recent Outpatient Visits           1 month ago [facility] Delsa Grana, PA-C   6 months ago Type 2 diabetes mellitus with microalbuminuria, without long-term current use of insulin [facility])   [facility] [place], Drue Stager, MD   1 year ago Type 2 diabetes mellitus with microalbuminuria, without long-term current use of insulin [facility])   [facility] [place], Drue Stager, MD   1 year ago Type 2 diabetes mellitus with microalbuminuria, without long-term current use of insulin [facility])   [facility] [place], Drue Stager, MD   1 year ago Type 2 diabetes mellitus with microalbuminuria, without long-term current use of insulin [facility])   [facility] Steele Sizer, MD       Future Appointments             In 1 month Steele Sizer, MD [facility] [place]   In 3 months  [facility]               JARDIANCE 25 MG TABS tablet 90 tablet 0    Sig: Take 1 tablet by mouth once daily      Endocrinology:  Diabetes - SGLT2 Inhibitors Failed - 03/18/2021  3:36 PM      Failed - Cr in normal range and within 360 days    Creat  Date Value Ref Range  Status  08/29/2020 1.00 (H) 0.60 - 0.93 mg/dL Final    Comment:    For patients >56 years of age, the reference limit for Creatinine is approximately 13% higher for people identified as African-American. .    Creatinine, Urine  Date Value Ref Range Status  08/29/2020 63 20 - 275 mg/dL Final          Failed - HBA1C is between 0 and 7.9 and within 180 days    Hemoglobin A1C  Date Value Ref Range Status  08/29/2020 6.8 (A) 4.0 - 5.6 % Final   HbA1c, POC (controlled diabetic range)  Date Value Ref Range Status  04/22/2019 7.4 (A) 0.0 - 7.0 % Final   Hgb A1c MFr Bld  Date Value Ref Range Status  10/30/2019 6.9 (H) <5.7 % of total Hgb Final    Comment:    For someone without known diabetes, a hemoglobin A1c value of 6.5% or greater indicates that they may have  diabetes and this should be confirmed with a follow-up  test. . For someone with known diabetes, a value <7% indicates  that their diabetes is well controlled and a value  greater than or equal to 7% indicates suboptimal  control. A1c targets should be individualized based on  duration of diabetes, age, comorbid conditions, and  other considerations. . Currently, no consensus exists regarding use of hemoglobin A1c for diagnosis of diabetes for children. .           Passed - LDL in normal range and within 360 days    LDL Cholesterol (Calc)  Date Value Ref Range Status  08/29/2020 45 mg/dL (calc) Final    Comment:    Reference range: <100 . Desirable range <100 mg/dL for primary prevention;   <70 mg/dL for patients with CHD or diabetic patients  with > or = 2 CHD risk factors. Marland Kitchen LDL-C is now calculated using the Martin-Hopkins  calculation, which is a validated novel method providing  better accuracy than the Friedewald equation in the  estimation of LDL-C.  Cresenciano Genre et al. Annamaria Helling. 4076;808(81): 2061-2068  (http://education.[employer].com/faq/FAQ164)           Passed - AA eGFR in normal range  and within 360 days    GFR, Est African American  Date Value Ref Range Status  08/29/2020 66 > OR = 60 mL/min/1.56m Final   GFR, Est Non African American  Date Value Ref Range Status  08/29/2020 57 (L) > OR = 60 mL/min/1.751mFinal          Passed - Valid encounter within last 6 months    Recent Outpatient Visits           1 month ago [facility]Ram[facility] CenteraBillington HeightsLeKristeen MissPA-C   6 months ago Type 2 diabetes mellitus with microalbuminuria, without long-term current use of insulin ([facility]  CHLos Arcos Medical CenteroOnyxKrDrue StagerMD   1 year ago Type 2 diabetes mellitus with microalbuminuria, without long-term current use of insulin ([facility]  CHMarco Island Medical CenteroTrentonKrDrue StagerMD   1 year ago Type 2 diabetes mellitus with microalbuminuria, without long-term current use of insulin ([facility]  CHPaw Paw Lake Medical CenteroWynonaKrDrue StagerMD   1 year ago Type 2 diabetes mellitus with microalbuminuria, without long-term current use of insulin ([facility]  CHHalesite Medical CenteroSteele SizerMD       Future Appointments  In 1 month Sowles, Drue Stager, MD [facility], [place]   In 3 months  [place]

## 2021-03-20 NOTE — Telephone Encounter (Signed)
Pt has an appt on 04/29/21

## 2021-03-31 ENCOUNTER — Telehealth: Payer: Self-pay

## 2021-03-31 NOTE — Progress Notes (Signed)
Chronic Care Management Pharmacy Assistant   Name: Lindsey [name]  MRN: [id number] DOB: 05/22/1951   Reason for Encounter:Diabetes Disease State Call.   Recent office visits:  02/01/2021 Delsa Grana PA-C (PCP) daily antihistamine once in the evening for nasal allergies - like generic zyrtec, allegra or claritin,Reduce your aspirin dose to every other day or 3 days a week  Recent consult visits:  01/11/2021 Luella Cook PA (Neurology) Decrease seroquel to 50mg  in the morning, 100mg  in the afternoon, and 100mg  at night.Can increase back to 100mg  three times a day if hallucinations return    Hospital visits:  None in previous 6 months  Medications: Outpatient Encounter Medications as of 03/31/2021  Medication Sig Note   alendronate (FOSAMAX) 70 MG tablet Take with a full glass of water on an empty stomach.    allopurinol (ZYLOPRIM) 100 MG tablet Take 1 tablet by mouth twice daily    aspirin 81 MG chewable tablet Chew 1 tablet by mouth daily. 04/06/2015: DX: 401.1 Received from: [place]:    carvedilol (COREG) 12.5 MG tablet Take 1 tablet (12.5 mg total) by mouth 2 (two) times daily with a meal.    donepezil (ARICEPT) 10 MG tablet Take 1 tablet (10 mg total) by mouth at bedtime. (Patient taking differently: Take 10 mg by mouth daily. In the morning)    glipiZIDE (GLUCOTROL XL) 2.5 MG 24 hr tablet Take 1 tablet by mouth once daily with breakfast    GLUCOSE BLOOD VI  04/06/2015: DX: 250.00 Received from: [place] Ethyl (VASCEPA) 1 g capsule Take 1 capsule (1 g total) by mouth 2 (two) times daily.    JARDIANCE 25 MG TABS tablet Take 1 tablet by mouth once daily    losartan (COZAAR) 25 MG tablet Take 1 tablet by mouth once daily    Melatonin 5 MG CAPS Take 5 mg by mouth at bedtime.     memantine (NAMENDA) 10 MG tablet Take 10 mg by mouth 2 (two) times daily.    MULTIPLE VITAMIN PO Take 1 tablet by mouth daily. 04/06/2015:  Received from: [place]: Take by mouth.   QUEtiapine (SEROQUEL) 100 MG tablet takes 50 mg in the morning, 150 mg in the evening, and 100 mg at night.    rosuvastatin (CRESTOR) 20 MG tablet Take 1 tablet by mouth once daily    SitaGLIPtin-MetFORMIN HCl (JANUMET XR) 50-1000 MG TB24 Take 2 tablets by mouth daily.    VITAMIN D PO Take 1,000 mg by mouth daily.     No facility-administered encounter medications on file as of 03/31/2021.    Care Gaps: Annual Wellness Visit  Star Rating Drugs: Glipizide 2.5 mg last filled on 03/18/2021 for 90 day supply at [facility]. Jardiance 25 mg last filled on 03/18/2021 for 90 day supply at [facility]. losartan 25 mg last filled on 03/13/2021 for 90 day supply at [facility]. rosuvastatin 20 mg last filled on 02/11/2021 for 90 day supply at [facility]. Janumet 50-1000 MG last filled on 02/25/2021 for 90 day supply [facility].  Recent Relevant Labs: Lab Results  Component Value Date/Time   HGBA1C 6.8 (A) 08/29/2020 03:19 PM   HGBA1C 7.0 (A) 02/26/2020 03:50 PM   HGBA1C 6.9 (H) 10/30/2019 09:24 AM   HGBA1C 7.4 (A) 04/22/2019 04:03 PM   HGBA1C 6.7 (H) 10/14/2018 10:01 AM   HGBA1C 6.9 07/04/2018 03:59 PM   MICROALBUR 17.9 08/29/2020 03:43 PM  MICROALBUR 1.7 10/30/2019 09:24 AM   MICROALBUR 20 08/02/2017 02:50 PM   MICROALBUR NEGATIVE 04/06/2016 08:24 AM    Kidney Function Lab Results  Component Value Date/Time   CREATININE 1.00 (H) 08/29/2020 03:43 PM   CREATININE 1.02 (H) 02/26/2020 04:30 PM   GFRNONAA 57 (L) 08/29/2020 03:43 PM   GFRAA 66 08/29/2020 03:43 PM    Current antihyperglycemic regimen:  Jardiance 25 mg daily Janumet XR 50-1000 mg 2 tablets daily Glipizide XR 2.5 mg daily What recent interventions/DTPs have been made to improve glycemic control:  None ID Have there been any recent hospitalizations or ED visits since last visit with CPP? No Patient denies  hypoglycemic symptoms, including Pale, Sweaty, Shaky, Hungry, Nervous/irritable, and Vision changes Patient denies hyperglycemic symptoms, including blurry vision, excessive thirst, fatigue, polyuria, and weakness How often are you checking your blood sugar? Patient daughter states patient does have a blood sugar machine but patient refuse to check her blood sugar. What are your blood sugars ranging?  None ID During the week, how often does your blood glucose drop below 70?  N/A Are you checking your feet daily/regularly?   Patient daughter states patient denies pain,numbness or tingling sensation in her feet.   Adherence Review: Is the patient currently on a STATIN medication? Yes Is the patient currently on ACE/ARB medication? Yes Does the patient have >5 day gap between last estimated fill dates? No  Patient is schedule for a telephone appointment with the clinical pharmacist on 05/11/2021 at 2:30 pm.  Patient is schedule for a Annual wellness visit on 07/13/2021  [place] Pharmacist Assistant [phone]

## 2021-04-24 ENCOUNTER — Other Ambulatory Visit: Payer: Self-pay

## 2021-04-24 DIAGNOSIS — M8580 Other specified disorders of bone density and structure, unspecified site: Secondary | ICD-10-CM

## 2021-04-24 MED ORDER — ALENDRONATE SODIUM 70 MG PO TABS: ORAL_TABLET | ORAL | 1 refills | Status: DC

## 2021-05-01 NOTE — Progress Notes (Signed)
Name: Lindsey [name]   MRN: [id number]    DOB: 12-02-1950   Date:05/02/2021       Progress Note  Subjective  Chief Complaint  Follow Up  HPI  DMII:   HgbA1C has been out of control 8.1%, 8.2% 8.2% , 6.9%, 6.7% , 7.4% , down to 7 % ,6.8 %  and today is 6.5 %  She has an aid coming to her house daily and has been compliant with medications. She denies polyphagia, polydipsia or polyuria. No recent episodes of hypoglycemia  She has obesity , CKI , dyslipidemia.she is on statin therapy and ARB. Discussed stopping glipizide, goal A1C for her is 7-8 % and daughter agreed  Breast cancer screen: discussed with daughter and patient, due to her dementia and inability to make decisions on her own it would not be wise to let her go through cancer treatment and daughter chose not to do screening    Osteopenia: she was on Evista, but switched to Fosamax because of insurance changes. She is doing well, repeat bone density was done 11/2018 and stable.   Auditory Hallucination: seen by Dr. Melrose Nakayama and also  by  Dr. Donalynn Furlong - geriatrician. She is still taking Seroquel  TID ( hallucinations got worse when tried to go down to 50 mg for am dose and is back to 100 mg TID) and  Aricept, the reason was increase in daytime episodes of hallucinations. She states hallucinations used to frighen her but no recent episodes.    HTN:  denies dizziness.  No  chest pain but has some sob with some activity , she denies palpitation  She is compliant with medication - daughter dispenses it . She has some anxiety coming to the office and bp was high , she had normal bp during visit with geriatrician    Hyperlipidemia: taking Crestor and denies side effects of medication, including no muscle ache. reviewed labs done in Nov   Major Depression in remission: daughter states patient seems happy, phq 9 positive in the past but seems negative now, but since she has dementia it is difficult to interpreter    Dementia: seen by Dr. Melrose Nakayama, she  states she continues to have auditory hallucinations, but not as frequent.  . She still lives alone, daughter is managing her bills, doctors visits, and she also dispenses her medications, she has an  Aid that spends the mornings with her, family members check on her during the evening . She has not been cooking. She still has episodes that she goes wandering, usually to neighbors house. They know their neighbors.   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 ([place]) 01/09/2016   Mild dementia ([place]) 11/01/2015   Auditory hallucination 07/26/2015   Anemia of chronic disease 07/05/2015   Cataract 04/06/2015   Type 2 diabetes mellitus with diabetic cataract ([place]) 04/06/2015   Benign essential HTN 04/02/2015   Controlled gout 04/02/2015   Dyslipidemia 04/02/2015   Depression, major, recurrent, mild ([place]) 04/02/2015   Fibromyalgia syndrome 04/02/2015   Chronic insomnia 04/02/2015   Cognitive decline 04/02/2015   Nodular basal cell carcinoma 04/02/2015   Morbid obesity ([place]) 04/02/2015   Osteopenia 04/02/2015   Calcium blood increased 04/02/2015    Past Surgical History:  Procedure Laterality Date   CESAREAN SECTION     TONSILECTOMY, ADENOIDECTOMY, BILATERAL MYRINGOTOMY AND TUBES  TUBAL LIGATION      Family History  Problem Relation Age of Onset   Hypertension Mother    Osteoporosis Mother    Cancer Mother        brain tumor   Hodgkin's lymphoma Father    Hypertension Sister    Cancer Sister        unknown   Hypertension Sister    Diabetes Sister    Cancer Sister 67       breast   Breast cancer Sister 68   Cancer Maternal Uncle     Social History   Tobacco Use   Smoking status: Never   Smokeless tobacco: Never   Tobacco comments:    smoking cessation materials not required  Substance Use Topics   Alcohol use: No    Alcohol/week: 0.0 standard  drinks     Current Outpatient Medications:    alendronate (FOSAMAX) 70 MG tablet, Take with a full glass of water on an empty stomach., Disp: 12 tablet, Rfl: 1   allopurinol (ZYLOPRIM) 100 MG tablet, Take 1 tablet by mouth twice daily, Disp: 180 tablet, Rfl: 0   aspirin 81 MG chewable tablet, Chew 81 mg by mouth 3 (three) times a week., Disp: , Rfl:    carvedilol (COREG) 12.5 MG tablet, Take 1 tablet (12.5 mg total) by mouth 2 (two) times daily with a meal., Disp: 180 tablet, Rfl: 1   donepezil (ARICEPT) 10 MG tablet, Take 1 tablet (10 mg total) by mouth at bedtime. (Patient taking differently: Take 10 mg by mouth daily. In the morning), Disp: 90 tablet, Rfl: 1   glipiZIDE (GLUCOTROL XL) 2.5 MG 24 hr tablet, Take 1 tablet by mouth once daily with breakfast, Disp: 90 tablet, Rfl: 0   GLUCOSE BLOOD VI, , Disp: , Rfl:    icosapent Ethyl (VASCEPA) 1 g capsule, Take 1 capsule (1 g total) by mouth 2 (two) times daily., Disp: 180 capsule, Rfl: 1   JARDIANCE 25 MG TABS tablet, Take 1 tablet by mouth once daily, Disp: 90 tablet, Rfl: 0   losartan (COZAAR) 25 MG tablet, Take 1 tablet by mouth once daily, Disp: 90 tablet, Rfl: 0   Melatonin 5 MG CAPS, Take 5 mg by mouth at bedtime. , Disp: , Rfl:    memantine (NAMENDA) 10 MG tablet, Take 10 mg by mouth 2 (two) times daily., Disp: , Rfl:    MULTIPLE VITAMIN PO, Take 1 tablet by mouth daily., Disp: , Rfl:    QUEtiapine (SEROQUEL) 100 MG tablet, Taking 100mg  am, afternoon, and pm, Disp: , Rfl:    rosuvastatin (CRESTOR) 20 MG tablet, Take 1 tablet by mouth once daily, Disp: 90 tablet, Rfl: 0   SitaGLIPtin-MetFORMIN HCl (JANUMET XR) 50-1000 MG TB24, Take 2 tablets by mouth daily., Disp: 180 tablet, Rfl: 1   VITAMIN D PO, Take 1,000 mg by mouth daily. , Disp: , Rfl:   Allergies  Allergen Reactions   Ace Inhibitors     I personally reviewed active problem list, medication list, allergies, family history, social history, health maintenance with the  patient/caregiver today.   ROS  Constitutional: Negative for fever or weight change.  Respiratory: Negative for cough and shortness of breath.   Cardiovascular: Negative for chest pain or palpitations.  Gastrointestinal: Negative for abdominal pain, no bowel changes.  Musculoskeletal: Negative for gait problem or joint swelling.  Skin: Negative for rash.  Neurological: Negative for dizziness or headache.  No other specific complaints in a complete review of systems (  except as listed in HPI above).   Objective  Vitals:   05/02/21 1528  BP: (!) 142/80  Pulse: 86  Resp: 16  Temp: 97.6 F (36.4 C)  TempSrc: Oral  SpO2: 98%  Weight: 152 lb (68.9 kg)  Height: 4\' 11"  (1.499 m)    Body mass index is 30.7 kg/m.  Physical Exam  Constitutional: Patient appears well-developed and well-nourished. Obese  No distress.  HEENT: head atraumatic, normocephalic, pupils equal and reactive to light,  neck supple Cardiovascular: Normal rate, regular rhythm and normal heart sounds.  No murmur heard. No BLE edema. Varicose veins  Pulmonary/Chest: Effort normal and breath sounds normal. No respiratory distress. Abdominal: Soft.  There is no tenderness. Psychiatric: Patient has a normal mood and affect. behavior is normal. Judgment and thought content normal.   Recent Results (from the past 2160 hour(s))  HM DIABETES EYE EXAM     Status: None   Collection Time: 02/06/21 12:00 AM  Result Value Ref Range   HM Diabetic Eye Exam No Retinopathy No Retinopathy    Comment: [place]  POCT HgB A1C     Status: Abnormal   Collection Time: 05/02/21  3:30 PM  Result Value Ref Range   Hemoglobin A1C 6.5 (A) 4.0 - 5.6 %   HbA1c POC (<> result, manual entry)     HbA1c, POC (prediabetic range)     HbA1c, POC (controlled diabetic range)       PHQ2/9: Depression screen [facility] 2/9 05/02/2021 02/01/2021 08/29/2020 08/29/2020 07/12/2020  Decreased Interest 0 1 1 0 0  Down, Depressed, Hopeless 0 0 0 0  0  PHQ - 2 Score 0 1 1 0 0  Altered sleeping - - 0 - -  Tired, decreased energy - - 0 - -  Change in appetite - - 1 - -  Feeling bad or failure about yourself  - - 0 - -  Trouble concentrating - - 1 - -  Moving slowly or fidgety/restless - - 0 - -  Suicidal thoughts - - 0 - -  PHQ-9 Score - - 3 - -  Difficult doing work/chores - - Somewhat difficult - -  Some recent data might be hidden    phq 9 is positive   Fall Risk: Fall Risk  05/02/2021 02/01/2021 08/29/2020 07/12/2020 02/26/2020  Falls in the past year? 0 0 0 0 0  Number falls in past yr: 0 0 0 0 0  Comment - - - - -  Injury with Fall? 0 0 0 0 0  Risk for fall due to : - - - No Fall Risks -  Risk for fall due to: Comment - - - - -  Follow up - - - Falls prevention discussed -     Functional Status Survey: Is the patient deaf or have difficulty hearing?: No Does the patient have difficulty seeing, even when wearing glasses/contacts?: No Does the patient have difficulty concentrating, remembering, or making decisions?: Yes Does the patient have difficulty walking or climbing stairs?: No Does the patient have difficulty dressing or bathing?: Yes Does the patient have difficulty doing errands alone such as visiting a doctor's office or shopping?: Yes    Assessment & Plan   1. Type 2 diabetes mellitus with diabetic cataract, without long-term current use of insulin (HCC)  - POCT HgB A1C  2. Type 2 diabetes mellitus with microalbuminuria, without long-term current use of insulin (HCC)  - Urine Microalbumin w/creat. ratio - empagliflozin (JARDIANCE) 25 MG  TABS tablet; Take 1 tablet (25 mg total) by mouth daily.  Dispense: 90 tablet; Refill: 1 - losartan (COZAAR) 25 MG tablet; Take 1 tablet (25 mg total) by mouth daily.  Dispense: 90 tablet; Refill: 1 - SitaGLIPtin-MetFORMIN HCl (JANUMET XR) 50-1000 MG TB24; Take 2 tablets by mouth daily.  Dispense: 180 tablet; Refill: 1  3. Controlled gout  - allopurinol (ZYLOPRIM)  100 MG tablet; Take 1 tablet (100 mg total) by mouth 2 (two) times daily.  Dispense: 180 tablet; Refill: 1  4. Osteopenia, unspecified location   5. Benign essential HTN  - carvedilol (COREG) 12.5 MG tablet; Take 1 tablet (12.5 mg total) by mouth 2 (two) times daily with a meal.  Dispense: 180 tablet; Refill: 1  6. Dyslipidemia  - rosuvastatin (CRESTOR) 20 MG tablet; Take 1 tablet (20 mg total) by mouth daily.  Dispense: 90 tablet; Refill: 1  7. Stage 3a chronic kidney disease ([place])   8. Auditory hallucination   9. Obesity   Discussed with the patient the risk posed by an increased BMI. Discussed importance of portion control, calorie counting and at least 150 minutes of physical activity weekly. Avoid sweet beverages and drink more water. Eat at least 6 servings of fruit and vegetables daily

## 2021-05-02 ENCOUNTER — Other Ambulatory Visit: Payer: Self-pay

## 2021-05-02 ENCOUNTER — Ambulatory Visit (INDEPENDENT_AMBULATORY_CARE_PROVIDER_SITE_OTHER): Payer: Medicare Other | Admitting: Family Medicine

## 2021-05-02 ENCOUNTER — Encounter: Payer: Self-pay | Admitting: Family Medicine

## 2021-05-02 VITALS — BP 142/80 | HR 86 | Temp 97.6°F | Resp 16 | Ht 59.0 in | Wt 152.0 lb

## 2021-05-02 DIAGNOSIS — I10 Essential (primary) hypertension: Secondary | ICD-10-CM | POA: Diagnosis not present

## 2021-05-02 DIAGNOSIS — M109 Gout, unspecified: Secondary | ICD-10-CM

## 2021-05-02 DIAGNOSIS — M8580 Other specified disorders of bone density and structure, unspecified site: Secondary | ICD-10-CM | POA: Diagnosis not present

## 2021-05-02 DIAGNOSIS — E1129 Type 2 diabetes mellitus with other diabetic kidney complication: Secondary | ICD-10-CM

## 2021-05-02 DIAGNOSIS — R809 Proteinuria, unspecified: Secondary | ICD-10-CM | POA: Diagnosis not present

## 2021-05-02 DIAGNOSIS — E1136 Type 2 diabetes mellitus with diabetic cataract: Secondary | ICD-10-CM

## 2021-05-02 DIAGNOSIS — E785 Hyperlipidemia, unspecified: Secondary | ICD-10-CM | POA: Diagnosis not present

## 2021-05-02 DIAGNOSIS — E669 Obesity, unspecified: Secondary | ICD-10-CM

## 2021-05-02 DIAGNOSIS — R440 Auditory hallucinations: Secondary | ICD-10-CM

## 2021-05-02 DIAGNOSIS — N1831 Chronic kidney disease, stage 3a: Secondary | ICD-10-CM | POA: Diagnosis not present

## 2021-05-02 LAB — POCT GLYCOSYLATED HEMOGLOBIN (HGB A1C): Hemoglobin A1C: 6.5 % — AB (ref 4.0–5.6)

## 2021-05-02 MED ORDER — ROSUVASTATIN CALCIUM 20 MG PO TABS: 20.0000 mg | ORAL_TABLET | Freq: Every day | ORAL | 1 refills | Status: DC

## 2021-05-02 MED ORDER — JANUMET XR 50-1000 MG PO TB24: 2.0000 | ORAL_TABLET | Freq: Every day | ORAL | 1 refills | Status: DC

## 2021-05-02 MED ORDER — EMPAGLIFLOZIN 25 MG PO TABS: 25.0000 mg | ORAL_TABLET | Freq: Every day | ORAL | 1 refills | Status: DC

## 2021-05-02 MED ORDER — LOSARTAN POTASSIUM 25 MG PO TABS: 25.0000 mg | ORAL_TABLET | Freq: Every day | ORAL | 1 refills | Status: DC

## 2021-05-02 MED ORDER — CARVEDILOL 12.5 MG PO TABS: 12.5000 mg | ORAL_TABLET | Freq: Two times a day (BID) | ORAL | 1 refills | Status: DC

## 2021-05-02 MED ORDER — ALLOPURINOL 100 MG PO TABS: 100.0000 mg | ORAL_TABLET | Freq: Two times a day (BID) | ORAL | 1 refills | Status: DC

## 2021-05-03 LAB — MICROALBUMIN / CREATININE URINE RATIO
Creatinine, Urine: 38 mg/dL (ref 20–275)
Microalb Creat Ratio: 8 mcg/mg creat (ref <30)
Microalb, Ur: 0.3 mg/dL

## 2021-05-10 ENCOUNTER — Telehealth: Payer: Self-pay

## 2021-05-10 NOTE — Progress Notes (Signed)
    Chronic Care Management Pharmacy Assistant   Name: Lindsey [name]  MRN: [id number] DOB: 1950-11-09  Patient called to remind of appointment with Junius Argyle, CPP on 05/11/2021 @ 1430 via telephone.   No answer, left message of appointment date, time and type of appointment (either telephone or in person). Left message to have all medications, supplements, blood pressure and/or blood sugar logs available during appointment and to return call if need to reschedule.   Star Rating Drug: Jardiance 25 mg last filled on 03/18/2021 for a 90-Day supply with [place] Losartan 25 mg last filled on 03/13/2021 for a 90-Day supply with [place] Rosuvastatin 20 mg last filled on 05/09/2021 for a 90-Day supply with [place] XR 50-1000 mg last filled on 02/25/2021 for a 90-Day supply with [place]  Any gaps in medications fill history? No   Care Gaps: Zoster Vaccines- Shingrix COVID-19 Vaccine Dose [place], CPA/CMA Clinical Pharmacist Assistant Phone: [phone]

## 2021-05-11 ENCOUNTER — Ambulatory Visit (INDEPENDENT_AMBULATORY_CARE_PROVIDER_SITE_OTHER): Payer: Medicare Other

## 2021-05-11 DIAGNOSIS — F0390 Unspecified dementia without behavioral disturbance: Secondary | ICD-10-CM

## 2021-05-11 DIAGNOSIS — R809 Proteinuria, unspecified: Secondary | ICD-10-CM | POA: Diagnosis not present

## 2021-05-11 DIAGNOSIS — E1129 Type 2 diabetes mellitus with other diabetic kidney complication: Secondary | ICD-10-CM

## 2021-05-11 DIAGNOSIS — F03A0 Unspecified dementia, mild, without behavioral disturbance, psychotic disturbance, mood disturbance, and anxiety: Secondary | ICD-10-CM

## 2021-05-11 DIAGNOSIS — M8589 Other specified disorders of bone density and structure, multiple sites: Secondary | ICD-10-CM

## 2021-05-11 NOTE — Progress Notes (Deleted)
Chronic Care Management Pharmacy Note  05/11/2021 Name:  Lindsey [name] MRN:  [id number] DOB:  Feb 06, 1951  Summary: Patient presents for CCM follow-up. Today's visit was 100% conducted with [name], the patient's daughter.   Recommendations/Changes made from today's visit: Continue current medications   Plan: CPP follow-up in 8 months    Subjective: Lindsey [name] is an 70 y.o. year old female who is a primary patient of Steele Sizer, MD.  The CCM team was consulted for assistance with disease management and care coordination needs.    Engaged with patient by telephone for follow up visit in response to provider referral for pharmacy case management and/or care coordination services.   Consent to Services:  The patient was given information about Chronic Care Management services, agreed to services, and gave verbal consent prior to initiation of services.  Please see initial visit note for detailed documentation.   Patient Care Team: Steele Sizer, MD as PCP - General (Family Medicine) Mendel Corning, MD as Consulting Physician (Psychiatry) Anabel Bene, MD as Consulting Physician (Neurology) Germaine Pomfret, [facility] as Pharmacist (Pharmacist)  Recent office visits: 05/02/21: Patient presented to Dr. Ancil Boozer for follow-up. A1c 6.5%. Aspirin, glipizide stopped.   Recent consult visits: 04/05/21: Patient presents to Dr. Donalynn Furlong (Geriatrics) for follow-up.   Hospital visits: None in previous 6 months   Objective:  Lab Results  Component Value Date   CREATININE 1.00 (H) 08/29/2020   BUN 11 08/29/2020   GFRNONAA 57 (L) 08/29/2020   GFRAA 66 08/29/2020   NA 142 08/29/2020   K 3.7 08/29/2020   CALCIUM 10.7 (H) 08/29/2020   CO2 30 08/29/2020   GLUCOSE 149 (H) 08/29/2020    Lab Results  Component Value Date/Time   HGBA1C 6.5 (A) 05/02/2021 03:30 PM   HGBA1C 6.8 (A) 08/29/2020 03:19 PM   HGBA1C 6.9 (H) 10/30/2019 09:24 AM   HGBA1C 7.4 (A) 04/22/2019 04:03 PM    HGBA1C 6.7 (H) 10/14/2018 10:01 AM   HGBA1C 6.9 07/04/2018 03:59 PM   MICROALBUR 0.3 05/02/2021 04:00 PM   MICROALBUR 17.9 08/29/2020 03:43 PM   MICROALBUR 20 08/02/2017 02:50 PM   MICROALBUR NEGATIVE 04/06/2016 08:24 AM    Last diabetic Eye exam:  Lab Results  Component Value Date/Time   HMDIABEYEEXA No Retinopathy 02/06/2021 12:00 AM    Last diabetic Foot exam: No results found for: HMDIABFOOTEX   Lab Results  Component Value Date   CHOL 102 08/29/2020   HDL 39 (L) 08/29/2020   LDLCALC 45 08/29/2020   TRIG 101 08/29/2020   CHOLHDL 2.6 08/29/2020    Hepatic Function Latest Ref Rng & Units 08/29/2020 02/26/2020 11/05/2019  Total Protein 6.1 - 8.1 g/dL 7.0 6.2 6.4(L)  Albumin 3.5 - 5.0 g/dL - - 3.8  AST 10 - 35 U/L 20 17 35  ALT 6 - 29 U/L _0 Alk Phosphatase 38 - 126 U/L - - 61  Total Bilirubin 0.2 - 1.2 mg/dL 0.6 0.5 0.5    Lab Results  Component Value Date/Time   TSH 3.67 08/29/2020 03:43 PM   TSH 1.16 02/26/2020 04:30 PM    CBC Latest Ref Rng & Units 08/29/2020 02/26/2020 11/05/2019  WBC 3.8 - 10.8 Thousand/uL 9.3 7.5 7.0  Hemoglobin 11.7 - 15.5 g/dL 13.1 11.0(L) 10.9(L)  Hematocrit 35.0 - 45.0 % 39.8 33.5(L) 33.9(L)  Platelets 140 - 400 Thousand/uL 253 253 192    Lab Results  Component Value Date/Time   VD25OH 58 08/29/2020 03:43 PM  VD25OH 61 10/30/2019 09:24 AM    Clinical ASCVD: No  The ASCVD Risk score Mikey Bussing DC Jr., et al., 2013) failed to calculate for the following reasons:   The valid total cholesterol range is 130 to 320 mg/dL    Depression screen [facility] 2/9 05/02/2021 02/01/2021 08/29/2020  Decreased Interest 0 1 1  Down, Depressed, Hopeless 0 0 0  PHQ - 2 Score 0 1 1  Altered sleeping - - 0  Tired, decreased energy - - 0  Change in appetite - - 1  Feeling bad or failure about yourself  - - 0  Trouble concentrating - - 1  Moving slowly or fidgety/restless - - 0  Suicidal thoughts - - 0  PHQ-9 Score - - 3  Difficult doing work/chores - -  Somewhat difficult  Some recent data might be hidden    Social History   Tobacco Use  Smoking Status Never  Smokeless Tobacco Never  Tobacco Comments   smoking cessation materials not required   BP Readings from Last 3 Encounters:  05/02/21 (!) 142/80  02/01/21 (!) 142/86  08/29/20 128/82   Pulse Readings from Last 3 Encounters:  05/02/21 86  02/01/21 99  08/29/20 93   Wt Readings from Last 3 Encounters:  05/02/21 152 lb (68.9 kg)  02/01/21 148 lb 6.4 oz (67.3 kg)  08/29/20 149 lb 1.6 oz (67.6 kg)   BMI Readings from Last 3 Encounters:  05/02/21 30.70 kg/m  02/01/21 29.97 kg/m  08/29/20 32.26 kg/m    Assessment/Interventions: Review of patient past medical history, allergies, medications, health status, including review of consultants reports, laboratory and other test data, was performed as part of comprehensive evaluation and provision of chronic care management services.   SDOH:  (Social Determinants of Health) assessments and interventions performed: Yes  SDOH Screenings   Alcohol Screen: Low Risk    Last Alcohol Screening Score (AUDIT): 0  Depression (PHQ2-9): Low Risk    PHQ-2 Score: 0  Financial Resource Strain: Low Risk    Difficulty of Paying Living Expenses: Not hard at all  Food Insecurity: No Food Insecurity   Worried About Charity fundraiser in the Last Year: Never true   Ran Out of Food in the Last Year: Never true  Housing: Low Risk    Last Housing Risk Score: 0  Physical Activity: Insufficiently Active   Days of Exercise per Week: 5 days   Minutes of Exercise per Session: 10 min  Social Connections: Moderately Isolated   Frequency of Communication with Friends and Family: More than three times a week   Frequency of Social Gatherings with Friends and Family: More than three times a week   Attends Religious Services: More than 4 times per year   Active Member of [employer] or Organizations: No   Attends Archivist Meetings: Never    Marital Status: Divorced  Stress: No Stress Concern Present   Feeling of Stress : Not at all  Tobacco Use: Low Risk    Smoking Tobacco Use: Never   Smokeless Tobacco Use: Never  Transportation Needs: No Transportation Needs   Lack of Transportation (Medical): No   Lack of Transportation (Non-Medical): No    CCM Care Plan  Allergies  Allergen Reactions   Ace Inhibitors     Medications Reviewed Today     Reviewed by Carlene Coria, [place] (Certified Medical Assistant) on 05/02/21 at 1519  Med List Status: <None>   Medication Order Taking? Sig Documenting Provider Last Dose Status  Informant  alendronate (FOSAMAX) 70 MG tablet [id number] Yes Take with a full glass of water on an empty stomach. Steele Sizer, MD Taking Active   allopurinol (ZYLOPRIM) 100 MG tablet [id number] Yes Take 1 tablet by mouth twice daily Steele Sizer, MD Taking Active   aspirin 81 MG chewable tablet [id number] Yes Chew 1 tablet by mouth daily. [provider] Taking Active            Med Note Wynetta Emery, TIFFANY L   Wed Apr 06, 2015  9:37 AM) DX: 401.1 Received from: [place]:   carvedilol (COREG) 12.5 MG tablet [id number] Yes Take 1 tablet (12.5 mg total) by mouth 2 (two) times daily with a meal. Steele Sizer, MD Taking Active   donepezil (ARICEPT) 10 MG tablet [id number] Yes Take 1 tablet (10 mg total) by mouth at bedtime.  Patient taking differently: Take 10 mg by mouth daily. In the morning   Steele Sizer, MD Taking Active   glipiZIDE (GLUCOTROL XL) 2.5 MG 24 hr tablet [id number] Yes Take 1 tablet by mouth once daily with breakfast Steele Sizer, MD Taking Active   GLUCOSE BLOOD VI [id number] Yes  [provider] Taking Active            Med Note Wynetta Emery, TIFFANY L   Wed Apr 06, 2015  9:37 AM) DX: 250.00 Received from: [place] Ethyl (VASCEPA) 1 g capsule [id number] Yes Take 1 capsule (1 g total) by mouth 2 (two) times  daily. Steele Sizer, MD Taking Active   JARDIANCE 25 MG TABS tablet [id number] Yes Take 1 tablet by mouth once daily Steele Sizer, MD Taking Active   losartan (COZAAR) 25 MG tablet [id number] Yes Take 1 tablet by mouth once daily Steele Sizer, MD Taking Active   Melatonin 5 MG CAPS [id number] Yes Take 5 mg by mouth at bedtime.  [provider] Taking Active   memantine (NAMENDA) 10 MG tablet [id number] Yes Take 10 mg by mouth 2 (two) times daily. [provider] Taking Active   MULTIPLE VITAMIN PO [id number] Yes Take 1 tablet by mouth daily. [provider] Taking Active            Med Note Carlene Coria   Tue May 02, 2021  3:19 PM)    QUEtiapine (SEROQUEL) 100 MG tablet [id number] Yes takes 50 mg in the morning, 150 mg in the evening, and 100 mg at night. [provider] Taking Active   rosuvastatin (CRESTOR) 20 MG tablet [id number] Yes Take 1 tablet by mouth once daily Steele Sizer, MD Taking Active   SitaGLIPtin-MetFORMIN HCl (JANUMET XR) 50-1000 MG TB24 [id number] Yes Take 2 tablets by mouth daily. Steele Sizer, MD Taking Active   VITAMIN D PO [id number] Yes Take 1,000 mg by mouth daily.  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 ([place]) 01/09/2016   Mild dementia ([place]) 11/01/2015   Auditory hallucination 07/26/2015   Anemia of chronic disease 07/05/2015   Cataract 04/06/2015   Type 2 diabetes mellitus with diabetic cataract ([place]) 04/06/2015   Benign essential HTN 04/02/2015   Controlled gout 04/02/2015   Dyslipidemia 04/02/2015   Depression, major, recurrent, mild ([place]) 04/02/2015   Fibromyalgia syndrome 04/02/2015   Chronic insomnia 04/02/2015   Cognitive  decline 04/02/2015   Nodular basal cell carcinoma 04/02/2015   Morbid obesity ([place]) 04/02/2015    Osteopenia 04/02/2015   Calcium blood increased 04/02/2015    Immunization History  Administered Date(s) Administered   Fluad Quad(high Dose 65+) 07/09/2019   Influenza Split 03/06/2007   Influenza, High Dose Seasonal PF 07/01/2015, 08/10/2015, 08/06/2016, 08/02/2017, 07/04/2018, 07/04/2020   Influenza-Unspecified 11/19/2014   PFIZER(Purple Top)SARS-COV-2 Vaccination 12/11/2019, 01/01/2020   Pneumococcal Conjugate-13 04/06/2016   Pneumococcal Polysaccharide-23 09/03/2006, 11/19/2014   Tdap 11/19/2014   Zoster, Live 11/19/2014    Conditions to be addressed/monitored:  Hypertension, Hyperlipidemia, Diabetes, Depression, Osteopenia, Gout, and Dementia  There are no care plans that you recently modified to display for this patient.    Medication Assistance: None required.  Patient affirms current coverage meets needs.  Compliance/Adherence/Medication fill history: Care Gaps: Zoster Vaccines- Shingrix COVID-19 Vaccine Dose 3  Star-Rating Drugs: Jardiance 25 mg last filled on 03/18/2021 for a 90-Day supply with [place] Losartan 25 mg last filled on 03/13/2021 for a 90-Day supply with [place] Rosuvastatin 20 mg last filled on 05/09/2021 for a 90-Day supply with [place] XR 50-1000 mg last filled on 02/25/2021 for a 90-Day supply with [place]  Patient's preferred pharmacy is:  [facility] [address], [place] - [place] [address] Phone: [phone] Fax: [phone]  Uses pill box? Yes Pt endorses 100% compliance  We discussed: Current pharmacy is preferred with insurance plan and patient is satisfied with pharmacy services Patient decided to: Continue current medication management strategy  Care Plan and Follow Up Patient Decision:  Patient agrees to Care Plan and Follow-up.  Plan: Telephone follow up appointment with care management team member scheduled for:  12/13/2021 at 3:00 PM  Malva Limes, [facility] [phone]  Current Barriers:  No barriers noted  Pharmacist Clinical Goal(s):  Patient will maintain control of diabetes as evidenced by A1c less than 8%  through collaboration with PharmD and provider.   Interventions: 1:1 collaboration with Steele Sizer, MD regarding development and update of comprehensive plan of care as evidenced by provider attestation and co-signature Inter-disciplinary care team collaboration (see longitudinal plan of care) Comprehensive medication review performed; medication list updated in electronic medical record  Hypertension  (Status:Goal on track: YES.)   Med Management Intervention:  No interventions indicated  (BP goal <140/90) -Controlled -Current treatment: Carvedilol 12.5 mg twice daily  Losartan 25 mg daily -Medications previously tried: NA  -Current home readings: NA -Denies hypotensive/hypertensive symptoms -Recommended to continue current medication  Hyperlipidemia: (LDL goal < 100) -Controlled -Current treatment: Vascepa 1g twice daily  Rosuvastatin 20 mg daily  -Medications previously tried: NA  -Recommended to continue current medication  Diabetes (A1c goal <8%) -Controlled -Current medications: Jardiance 25 mg daily  Janumet XR 50-1000 2 tablets daily  -Medications previously tried: NA  -Current home glucose readings fasting glucose: NA -Denies hypoglycemic/hyperglycemic symptoms -Recommended to continue current medication  Osteoporosis / Osteopenia (Goal Prevent bone fractures) -Controlled -Last DEXA Scan: 11/25/18   T-Score femoral neck: -1.9  T-Score total hip: -1.5  T-Score lumbar spine: -1.8  T-Score forearm radius: NA  10-year probability of major osteoporotic fracture: NA  10-year probability of hip fracture: NA -Current treatment  Alendronate 70 mg weekly (started Jan 2018)  -Medications previously tried: NA  -Counseled on oral  bisphosphonate administration: take in the morning, 30 minutes prior to food with 6-8 oz of water. Do not lie down  for at least 30 minutes after taking. -Recommended to continue current medication  Gout (Goal: Prevent gout flares) -Controlled -Current treatment  Allopurinol 100 mg twice daily  -Medications previously tried: NA  -Recommended to continue current medication  Dementia (Goal: prevent disease progression) -Controlled -Current treatment  Donepzil 10 mg daily  Memantine 10 mg twice daily  -Medications previously tried: NA -Patient continues to experience mild cognitive decline, focusing on stimulating exercises/puzzles.  -Patient has caregivers in AM and PM when daughter is unavailable.   -Recommended to continue current medication  Patient Goals/Self-Care Activities Patient will:  - check glucose daily before breakfast, document, and provide at future appointments check blood pressure weekly, document, and provide at future appointments  Follow Up Plan: Telephone follow up appointment with care management team member scheduled for:  12/13/2021 at 3:00 PM

## 2021-05-12 NOTE — Patient Instructions (Signed)
Visit Information It was great speaking with you today!  Please let me know if you have any questions about our visit.   Goals Addressed             This Visit's Progress    Monitor and Manage My Blood Sugar-Diabetes Type 2       Timeframe:  Long-Range Goal Priority:  High Start Date: 05/12/2021                             Expected End Date: 05/12/2022                       Follow Up Date 07/14/2021     - check blood sugar at prescribed times - check blood sugar if I feel it is too high or too low - enter blood sugar readings and medication or insulin into daily log    Why is this important?   Checking your blood sugar at home helps to keep it from getting very high or very low.  Writing the results in a diary or log helps the doctor know how to care for you.  Your blood sugar log should have the time, date and the results.  Also, write down the amount of insulin or other medicine that you take.  Other information, like what you ate, exercise done and how you were feeling, will also be helpful.     Notes:         Patient Care Plan: General Pharmacy (Adult)     Problem Identified: Hypertension, Hyperlipidemia, Diabetes, Depression, Osteopenia, Gout, and Dementia   Priority: High     Long-Range Goal: Patient-Specific Goal   Start Date: 05/12/2021  Expected End Date: 05/12/2022  This Visit's Progress: On track  Priority: High  Note:   Current Barriers:  No barriers noted  Pharmacist Clinical Goal(s):  Patient will maintain control of diabetes as evidenced by A1c less than 8%  through collaboration with PharmD and provider.   Interventions: 1:1 collaboration with Steele Sizer, MD regarding development and update of comprehensive plan of care as evidenced by provider attestation and co-signature Inter-disciplinary care team collaboration (see longitudinal plan of care) Comprehensive medication review performed; medication list updated in electronic medical  record  Hypertension  (Status:Goal on track: YES.)   Med Management Intervention: No interventions indicated (BP goal <140/90) -Controlled -Current treatment: Carvedilol 12.5 mg twice daily  Losartan 25 mg daily -Medications previously tried: NA  -Current home readings: NA -Denies hypotensive/hypertensive symptoms -Recommended to continue current medication  Hyperlipidemia: (LDL goal < 100) -Controlled -Current treatment: Vascepa 1g twice daily  Rosuvastatin 20 mg daily  -Medications previously tried: NA  -Recommended to continue current medication  Diabetes (A1c goal <8%) -Controlled -Current medications: Jardiance 25 mg daily  Janumet XR 50-1000 2 tablets daily  -Medications previously tried: NA  -Current home glucose readings fasting glucose: NA -Denies hypoglycemic/hyperglycemic symptoms -Recommended to continue current medication  Osteoporosis / Osteopenia (Goal Prevent bone fractures) -Controlled -Last DEXA Scan: 11/25/18   T-Score femoral neck: -1.9  T-Score total hip: -1.5  T-Score lumbar spine: -1.8  T-Score forearm radius: NA  10-year probability of major osteoporotic fracture: NA  10-year probability of hip fracture: NA -Current treatment  Alendronate 70 mg weekly (started Jan 2018)  -Medications previously tried: NA  -Counseled on oral bisphosphonate administration: take in the morning, 30 minutes prior to food with 6-8 oz of water. Do not  lie down for at least 30 minutes after taking. -Recommended to continue current medication  Gout (Goal: Prevent gout flares) -Controlled -Current treatment  Allopurinol 100 mg twice daily  -Medications previously tried: NA  -Recommended to continue current medication  Dementia (Goal: prevent disease progression) -Controlled -Current treatment  Donepzil 10 mg daily  Memantine 10 mg twice daily  -Medications previously tried: NA -Patient continues to experience mild cognitive decline, focusing on stimulating  exercises/puzzles.  -Patient has caregivers in AM and PM when daughter is unavailable.   -Recommended to continue current medication  Patient Goals/Self-Care Activities Patient will:  - check glucose daily before breakfast, document, and provide at future appointments check blood pressure weekly, document, and provide at future appointments  Follow Up Plan: Telephone follow up appointment with care management team member scheduled for:  12/13/2021 at 3:00 PM    Patient agreed to services and verbal consent obtained.   Patient verbalizes understanding of instructions provided today and agrees to view in [place].   Malva Limes, [facility] [phone]

## 2021-05-12 NOTE — Progress Notes (Signed)
Chronic Care Management Pharmacy Note  05/12/2021 Name:  Lindsey [name] MRN:  [id number] DOB:  Feb 14, 1951  Summary: Patient presents for CCM follow-up. Today's visit was 100% conducted with [name], the patient's daughter.   Recommendations/Changes made from today's visit: Continue current medications   Plan: CPP follow-up in 8 months    Subjective: Lindsey [name] is an 70 y.o. year old female who is a primary patient of Steele Sizer, MD.  The CCM team was consulted for assistance with disease management and care coordination needs.    Engaged with patient by telephone for follow up visit in response to provider referral for pharmacy case management and/or care coordination services.   Consent to Services:  The patient was given information about Chronic Care Management services, agreed to services, and gave verbal consent prior to initiation of services.  Please see initial visit note for detailed documentation.   Patient Care Team: Steele Sizer, MD as PCP - General (Family Medicine) Mendel Corning, MD as Consulting Physician (Psychiatry) Anabel Bene, MD as Consulting Physician (Neurology) Germaine Pomfret, [facility] as Pharmacist (Pharmacist)  Recent office visits: 05/02/21: Patient presented to Dr. Ancil Boozer for follow-up. A1c 6.5%. Aspirin, glipizide stopped.   Recent consult visits: 04/05/21: Patient presents to Dr. Donalynn Furlong (Geriatrics) for follow-up.   Hospital visits: None in previous 6 months   Objective:  Lab Results  Component Value Date   CREATININE 1.00 (H) 08/29/2020   BUN 11 08/29/2020   GFRNONAA 57 (L) 08/29/2020   GFRAA 66 08/29/2020   NA 142 08/29/2020   K 3.7 08/29/2020   CALCIUM 10.7 (H) 08/29/2020   CO2 30 08/29/2020   GLUCOSE 149 (H) 08/29/2020    Lab Results  Component Value Date/Time   HGBA1C 6.5 (A) 05/02/2021 03:30 PM   HGBA1C 6.8 (A) 08/29/2020 03:19 PM   HGBA1C 6.9 (H) 10/30/2019 09:24 AM   HGBA1C 7.4 (A) 04/22/2019 04:03 PM    HGBA1C 6.7 (H) 10/14/2018 10:01 AM   HGBA1C 6.9 07/04/2018 03:59 PM   MICROALBUR 0.3 05/02/2021 04:00 PM   MICROALBUR 17.9 08/29/2020 03:43 PM   MICROALBUR 20 08/02/2017 02:50 PM   MICROALBUR NEGATIVE 04/06/2016 08:24 AM    Last diabetic Eye exam:  Lab Results  Component Value Date/Time   HMDIABEYEEXA No Retinopathy 02/06/2021 12:00 AM    Last diabetic Foot exam: No results found for: HMDIABFOOTEX   Lab Results  Component Value Date   CHOL 102 08/29/2020   HDL 39 (L) 08/29/2020   LDLCALC 45 08/29/2020   TRIG 101 08/29/2020   CHOLHDL 2.6 08/29/2020    Hepatic Function Latest Ref Rng & Units 08/29/2020 02/26/2020 11/05/2019  Total Protein 6.1 - 8.1 g/dL 7.0 6.2 6.4(L)  Albumin 3.5 - 5.0 g/dL - - 3.8  AST 10 - 35 U/L 20 17 35  ALT 6 - 29 U/L _0 Alk Phosphatase 38 - 126 U/L - - 61  Total Bilirubin 0.2 - 1.2 mg/dL 0.6 0.5 0.5    Lab Results  Component Value Date/Time   TSH 3.67 08/29/2020 03:43 PM   TSH 1.16 02/26/2020 04:30 PM    CBC Latest Ref Rng & Units 08/29/2020 02/26/2020 11/05/2019  WBC 3.8 - 10.8 Thousand/uL 9.3 7.5 7.0  Hemoglobin 11.7 - 15.5 g/dL 13.1 11.0(L) 10.9(L)  Hematocrit 35.0 - 45.0 % 39.8 33.5(L) 33.9(L)  Platelets 140 - 400 Thousand/uL 253 253 192    Lab Results  Component Value Date/Time   VD25OH 58 08/29/2020 03:43 PM  VD25OH 61 10/30/2019 09:24 AM    Clinical ASCVD: No  The ASCVD Risk score Mikey Bussing DC Jr., et al., 2013) failed to calculate for the following reasons:   The valid total cholesterol range is 130 to 320 mg/dL    Depression screen [facility] 2/9 05/02/2021 02/01/2021 08/29/2020  Decreased Interest 0 1 1  Down, Depressed, Hopeless 0 0 0  PHQ - 2 Score 0 1 1  Altered sleeping - - 0  Tired, decreased energy - - 0  Change in appetite - - 1  Feeling bad or failure about yourself  - - 0  Trouble concentrating - - 1  Moving slowly or fidgety/restless - - 0  Suicidal thoughts - - 0  PHQ-9 Score - - 3  Difficult doing work/chores - -  Somewhat difficult  Some recent data might be hidden    Social History   Tobacco Use  Smoking Status Never  Smokeless Tobacco Never  Tobacco Comments   smoking cessation materials not required   BP Readings from Last 3 Encounters:  05/02/21 (!) 142/80  02/01/21 (!) 142/86  08/29/20 128/82   Pulse Readings from Last 3 Encounters:  05/02/21 86  02/01/21 99  08/29/20 93   Wt Readings from Last 3 Encounters:  05/02/21 152 lb (68.9 kg)  02/01/21 148 lb 6.4 oz (67.3 kg)  08/29/20 149 lb 1.6 oz (67.6 kg)   BMI Readings from Last 3 Encounters:  05/02/21 30.70 kg/m  02/01/21 29.97 kg/m  08/29/20 32.26 kg/m    Assessment/Interventions: Review of patient past medical history, allergies, medications, health status, including review of consultants reports, laboratory and other test data, was performed as part of comprehensive evaluation and provision of chronic care management services.   SDOH:  (Social Determinants of Health) assessments and interventions performed: Yes SDOH Interventions    Flowsheet Row Most Recent Value  SDOH Interventions   Financial Strain Interventions Intervention Not Indicated      SDOH Screenings   Alcohol Screen: Low Risk    Last Alcohol Screening Score (AUDIT): 0  Depression (PHQ2-9): Low Risk    PHQ-2 Score: 0  Financial Resource Strain: Low Risk    Difficulty of Paying Living Expenses: Not hard at all  Food Insecurity: No Food Insecurity   Worried About Charity fundraiser in the Last Year: Never true   Ran Out of Food in the Last Year: Never true  Housing: Low Risk    Last Housing Risk Score: 0  Physical Activity: Insufficiently Active   Days of Exercise per Week: 5 days   Minutes of Exercise per Session: 10 min  Social Connections: Moderately Isolated   Frequency of Communication with Friends and Family: More than three times a week   Frequency of Social Gatherings with Friends and Family: More than three times a week   Attends  Religious Services: More than 4 times per year   Active Member of [employer] or Organizations: No   Attends Archivist Meetings: Never   Marital Status: Divorced  Stress: No Stress Concern Present   Feeling of Stress : Not at all  Tobacco Use: Low Risk    Smoking Tobacco Use: Never   Smokeless Tobacco Use: Never  Transportation Needs: No Transportation Needs   Lack of Transportation (Medical): No   Lack of Transportation (Non-Medical): No    CCM Care Plan  Allergies  Allergen Reactions   Ace Inhibitors     Medications Reviewed Today     Reviewed by Lavone Neri,  [place], CMA Estate agent) on 05/02/21 at 1519  Med List Status: <None>   Medication Order Taking? Sig Documenting Provider Last Dose Status Informant  alendronate (FOSAMAX) 70 MG tablet [id number] Yes Take with a full glass of water on an empty stomach. Steele Sizer, MD Taking Active   allopurinol (ZYLOPRIM) 100 MG tablet [id number] Yes Take 1 tablet by mouth twice daily Steele Sizer, MD Taking Active   aspirin 81 MG chewable tablet [id number] Yes Chew 1 tablet by mouth daily. [provider] Taking Active            Med Note Wynetta Emery, TIFFANY L   Wed Apr 06, 2015  9:37 AM) DX: 401.1 Received from: [place]:   carvedilol (COREG) 12.5 MG tablet [id number] Yes Take 1 tablet (12.5 mg total) by mouth 2 (two) times daily with a meal. Steele Sizer, MD Taking Active   donepezil (ARICEPT) 10 MG tablet [id number] Yes Take 1 tablet (10 mg total) by mouth at bedtime.  Patient taking differently: Take 10 mg by mouth daily. In the morning   Steele Sizer, MD Taking Active   glipiZIDE (GLUCOTROL XL) 2.5 MG 24 hr tablet [id number] Yes Take 1 tablet by mouth once daily with breakfast Steele Sizer, MD Taking Active   GLUCOSE BLOOD VI [id number] Yes  [provider] Taking Active            Med Note Wynetta Emery, TIFFANY L   Wed Apr 06, 2015  9:37 AM) DX: 250.00  Received from: [place] Ethyl (VASCEPA) 1 g capsule [id number] Yes Take 1 capsule (1 g total) by mouth 2 (two) times daily. Steele Sizer, MD Taking Active   JARDIANCE 25 MG TABS tablet [id number] Yes Take 1 tablet by mouth once daily Steele Sizer, MD Taking Active   losartan (COZAAR) 25 MG tablet [id number] Yes Take 1 tablet by mouth once daily Steele Sizer, MD Taking Active   Melatonin 5 MG CAPS [id number] Yes Take 5 mg by mouth at bedtime.  [provider] Taking Active   memantine (NAMENDA) 10 MG tablet [id number] Yes Take 10 mg by mouth 2 (two) times daily. [provider] Taking Active   MULTIPLE VITAMIN PO [id number] Yes Take 1 tablet by mouth daily. [provider] Taking Active            Med Note Carlene Coria   Tue May 02, 2021  3:19 PM)    QUEtiapine (SEROQUEL) 100 MG tablet [id number] Yes takes 50 mg in the morning, 150 mg in the evening, and 100 mg at night. [provider] Taking Active   rosuvastatin (CRESTOR) 20 MG tablet [id number] Yes Take 1 tablet by mouth once daily Steele Sizer, MD Taking Active   SitaGLIPtin-MetFORMIN HCl (JANUMET XR) 50-1000 MG TB24 [id number] Yes Take 2 tablets by mouth daily. Steele Sizer, MD Taking Active   VITAMIN D PO [id number] Yes Take 1,000 mg by mouth daily.  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 ([place]) 01/09/2016   Mild dementia ([place]) 11/01/2015   Auditory hallucination 07/26/2015   Anemia of chronic disease 07/05/2015   Cataract 04/06/2015   Type 2 diabetes mellitus with diabetic cataract ([place]) 04/06/2015   Benign essential HTN 04/02/2015   Controlled gout 04/02/2015  Dyslipidemia 04/02/2015   Depression, major, recurrent, mild ([place]) 04/02/2015   Fibromyalgia syndrome 04/02/2015    Chronic insomnia 04/02/2015   Cognitive decline 04/02/2015   Nodular basal cell carcinoma 04/02/2015   Morbid obesity ([place]) 04/02/2015   Osteopenia 04/02/2015   Calcium blood increased 04/02/2015    Immunization History  Administered Date(s) Administered   Fluad Quad(high Dose 65+) 07/09/2019   Influenza Split 03/06/2007   Influenza, High Dose Seasonal PF 07/01/2015, 08/10/2015, 08/06/2016, 08/02/2017, 07/04/2018, 07/04/2020   Influenza-Unspecified 11/19/2014   PFIZER(Purple Top)SARS-COV-2 Vaccination 12/11/2019, 01/01/2020   Pneumococcal Conjugate-13 04/06/2016   Pneumococcal Polysaccharide-23 09/03/2006, 11/19/2014   Tdap 11/19/2014   Zoster, Live 11/19/2014    Conditions to be addressed/monitored:  Hypertension, Hyperlipidemia, Diabetes, Depression, Osteopenia, Gout, and Dementia  Care Plan : General Pharmacy (Adult)  Updates made by Germaine Pomfret, [place] since 05/12/2021 12:00 AM     Problem: Hypertension, Hyperlipidemia, Diabetes, Depression, Osteopenia, Gout, and Dementia   Priority: High     Long-Range Goal: Patient-Specific Goal   Start Date: 05/12/2021  Expected End Date: 05/12/2022  This Visit's Progress: On track  Priority: High  Note:   Current Barriers:  No barriers noted  Pharmacist Clinical Goal(s):  Patient will maintain control of diabetes as evidenced by A1c less than 8%  through collaboration with PharmD and provider.   Interventions: 1:1 collaboration with Steele Sizer, MD regarding development and update of comprehensive plan of care as evidenced by provider attestation and co-signature Inter-disciplinary care team collaboration (see longitudinal plan of care) Comprehensive medication review performed; medication list updated in electronic medical record  Hypertension  (Status:Goal on track: YES.)   Med Management Intervention: No interventions indicated (BP goal <140/90) -Controlled -Current treatment: Carvedilol 12.5 mg twice daily   Losartan 25 mg daily -Medications previously tried: NA  -Current home readings: NA -Denies hypotensive/hypertensive symptoms -Recommended to continue current medication  Hyperlipidemia: (LDL goal < 100) -Controlled -Current treatment: Vascepa 1g twice daily  Rosuvastatin 20 mg daily  -Medications previously tried: NA  -Recommended to continue current medication  Diabetes (A1c goal <8%) -Controlled -Current medications: Jardiance 25 mg daily  Janumet XR 50-1000 2 tablets daily  -Medications previously tried: NA  -Current home glucose readings fasting glucose: NA -Denies hypoglycemic/hyperglycemic symptoms -Recommended to continue current medication  Osteoporosis / Osteopenia (Goal Prevent bone fractures) -Controlled -Last DEXA Scan: 11/25/18   T-Score femoral neck: -1.9  T-Score total hip: -1.5  T-Score lumbar spine: -1.8  T-Score forearm radius: NA  10-year probability of major osteoporotic fracture: NA  10-year probability of hip fracture: NA -Current treatment  Alendronate 70 mg weekly (started Jan 2018)  -Medications previously tried: NA  -Counseled on oral bisphosphonate administration: take in the morning, 30 minutes prior to food with 6-8 oz of water. Do not lie down for at least 30 minutes after taking. -Recommended to continue current medication  Gout (Goal: Prevent gout flares) -Controlled -Current treatment  Allopurinol 100 mg twice daily  -Medications previously tried: NA  -Recommended to continue current medication  Dementia (Goal: prevent disease progression) -Controlled -Current treatment  Donepzil 10 mg daily  Memantine 10 mg twice daily  -Medications previously tried: NA -Patient continues to experience mild cognitive decline, focusing on stimulating exercises/puzzles.  -Patient has caregivers in AM and PM when daughter is unavailable.   -Recommended to continue current medication  Patient Goals/Self-Care Activities Patient will:  - check  glucose daily before breakfast, document, and provide at future appointments check blood pressure weekly, document, and provide at  future appointments  Follow Up Plan: Telephone follow up appointment with care management team member scheduled for:  12/13/2021 at 3:00 PM      Medication Assistance: None required.  Patient affirms current coverage meets needs.  Compliance/Adherence/Medication fill history: Care Gaps: Zoster Vaccines- Shingrix COVID-19 Vaccine Dose 3  Star-Rating Drugs: Jardiance 25 mg last filled on 03/18/2021 for a 90-Day supply with [place] Losartan 25 mg last filled on 03/13/2021 for a 90-Day supply with [place] Rosuvastatin 20 mg last filled on 05/09/2021 for a 90-Day supply with [place] XR 50-1000 mg last filled on 02/25/2021 for a 90-Day supply with [place]  Patient's preferred pharmacy is:  [facility] [address] [place] - [place] [address] Phone: [phone] Fax: [phone]  Uses pill box? Yes Pt endorses 100% compliance  We discussed: Current pharmacy is preferred with insurance plan and patient is satisfied with pharmacy services Patient decided to: Continue current medication management strategy  Care Plan and Follow Up Patient Decision:  Patient agrees to Care Plan and Follow-up.  Plan: Telephone follow up appointment with care management team member scheduled for:  12/13/2021 at 3:00 PM  Malva Limes, [facility] [phone]

## 2021-06-02 ENCOUNTER — Telehealth: Payer: Self-pay

## 2021-06-02 NOTE — Progress Notes (Signed)
Chronic Care Management Pharmacy Assistant   Name: Lindsey [name]  MRN: [id number] DOB: 1950-12-18  Reason for Encounter:Diabetes Disease State Call   Recent office visits:  No recent Office Visit  Recent consult visits:  No recent [facility] visits:  None in previous 6 months  Medications: Outpatient Encounter Medications as of 06/02/2021  Medication Sig Note   alendronate (FOSAMAX) 70 MG tablet Take with a full glass of water on an empty stomach.    allopurinol (ZYLOPRIM) 100 MG tablet Take 1 tablet (100 mg total) by mouth 2 (two) times daily.    carvedilol (COREG) 12.5 MG tablet Take 1 tablet (12.5 mg total) by mouth 2 (two) times daily with a meal.    donepezil (ARICEPT) 10 MG tablet Take 1 tablet (10 mg total) by mouth at bedtime. (Patient taking differently: Take 10 mg by mouth daily. In the morning)    empagliflozin (JARDIANCE) 25 MG TABS tablet Take 1 tablet (25 mg total) by mouth daily.    GLUCOSE BLOOD VI  04/06/2015: DX: 250.00 Received from: [place] Ethyl (VASCEPA) 1 g capsule Take 1 capsule (1 g total) by mouth 2 (two) times daily.    losartan (COZAAR) 25 MG tablet Take 1 tablet (25 mg total) by mouth daily.    Melatonin 5 MG CAPS Take 5 mg by mouth at bedtime.     memantine (NAMENDA) 10 MG tablet Take 10 mg by mouth 2 (two) times daily.    MULTIPLE VITAMIN PO Take 1 tablet by mouth daily.    QUEtiapine (SEROQUEL) 100 MG tablet Take 1 tablet by mouth in the morning, at noon, and at bedtime. Taking '100mg'$  am, afternoon, and pm    rosuvastatin (CRESTOR) 20 MG tablet Take 1 tablet (20 mg total) by mouth daily.    SitaGLIPtin-MetFORMIN HCl (JANUMET XR) 50-1000 MG TB24 Take 2 tablets by mouth daily.    VITAMIN D PO Take 1,000 mg by mouth daily.     No facility-administered encounter medications on file as of 06/02/2021.    Care Gaps: Zoster Vaccines- Shingrix COVID-19 Vaccine Dose 3 Influenza Vaccine  Star Rating  Drug: Jardiance 25 mg last filled on 08/112022 for a 90-Day supply with [place] Losartan 25 mg last filled on 05/20/2021 for a 90-Day supply with [place] Rosuvastatin 20 mg last filled on 05/09/2021 for a 90-Day supply with [place] XR 50-1000 mg last filled on 05/21/2021 for a 90-Day supply with [place] Medication Fill Gaps: None ID  Recent Relevant Labs: Lab Results  Component Value Date/Time   HGBA1C 6.5 (A) 05/02/2021 03:30 PM   HGBA1C 6.8 (A) 08/29/2020 03:19 PM   HGBA1C 6.9 (H) 10/30/2019 09:24 AM   HGBA1C 7.4 (A) 04/22/2019 04:03 PM   HGBA1C 6.7 (H) 10/14/2018 10:01 AM   HGBA1C 6.9 07/04/2018 03:59 PM   MICROALBUR 0.3 05/02/2021 04:00 PM   MICROALBUR 17.9 08/29/2020 03:43 PM   MICROALBUR 20 08/02/2017 02:50 PM   MICROALBUR NEGATIVE 04/06/2016 08:24 AM    Kidney Function Lab Results  Component Value Date/Time   CREATININE 1.00 (H) 08/29/2020 03:43 PM   CREATININE 1.02 (H) 02/26/2020 04:30 PM   GFRNONAA 57 (L) 08/29/2020 03:43 PM   GFRAA 66 08/29/2020 03:43 PM    Current antihyperglycemic regimen:  Jardiance 25 mg daily  Janumet XR 50-1000 2 tablets daily  What recent interventions/DTPs have been made to improve glycemic control:  None ID Have there been any recent  hospitalizations or ED visits since last visit with CPP? No Patient denies hypoglycemic symptoms, including Pale, Sweaty, Shaky, Hungry, Nervous/irritable, and Vision changes Patient denies hyperglycemic symptoms, including blurry vision, excessive thirst, fatigue, polyuria, and weakness How often are you checking your blood sugar?  Patient daughter  states patient does not check her blood sugar at home. What are your blood sugars ranging?  N/A During the week, how often does your blood glucose drop below 70?  N/A Are you checking your feet daily/regularly?   Patient daughter states patient denies any numbness,pain or tingling in her feet.   Adherence  Review: Is the patient currently on a STATIN medication? Yes Is the patient currently on ACE/ARB medication? Yes Does the patient have >5 day gap between last estimated fill dates? No   Anderson Malta Clinical Production designer, theatre/television/film [phone]

## 2021-06-16 ENCOUNTER — Telehealth: Payer: Self-pay | Admitting: Family Medicine

## 2021-06-16 NOTE — Telephone Encounter (Signed)
Copied from [place] [phone]. Topic: Medicare AWV >> Jun 16, 2021 10:20 AM Cher Nakai R wrote: Reason for CRM:  Left message re: AWV scheduled 07/13/2021  has been rescheduled to 07/20/2021 at 10:40 due to [facility] schedule change: She is a patient at [facility]

## 2021-07-13 ENCOUNTER — Ambulatory Visit: Payer: Medicare Other

## 2021-07-13 DIAGNOSIS — R413 Other amnesia: Secondary | ICD-10-CM | POA: Diagnosis not present

## 2021-07-20 ENCOUNTER — Ambulatory Visit: Payer: Medicare Other

## 2021-08-15 ENCOUNTER — Ambulatory Visit (INDEPENDENT_AMBULATORY_CARE_PROVIDER_SITE_OTHER): Payer: Medicare Other

## 2021-08-15 DIAGNOSIS — Z0000 Encounter for general adult medical examination without abnormal findings: Secondary | ICD-10-CM

## 2021-08-15 NOTE — Progress Notes (Signed)
Subjective:   Lindsey [name] is a 70 y.o. female who presents for Medicare Annual (Subsequent) preventive examination.  Virtual Visit via Telephone Note  I connected with  Lindsey [name] on 08/15/21 at  3:30 PM EDT by telephone and verified that I am speaking with the correct person using two identifiers.  Location: Patient: home Provider: [place] Persons participating in the virtual visit: [place]   I discussed the limitations, risks, security and privacy concerns of performing an evaluation and management service by telephone and the availability of in person appointments. The patient expressed understanding and agreed to proceed.  Interactive audio and video telecommunications were attempted between this nurse and patient, however failed, due to patient having technical difficulties OR patient did not have access to video capability.  We continued and completed visit with audio only.  Some vital signs may be absent or patient reported.   Clemetine Marker, LPN   Review of Systems     Cardiac Risk Factors include: advanced age (>21men, >73 women);diabetes mellitus;dyslipidemia;hypertension;obesity (BMI >30kg/m2)     Objective:    There were no vitals filed for this visit. There is no height or weight on file to calculate BMI.  Advanced Directives 08/15/2021 07/12/2020 11/05/2019 07/09/2019 07/04/2018 08/02/2017 04/01/2017  Does Patient Have a Medical Advance Directive? No No No No No No No  Type of Advance Directive - - - - - - -  Does patient want to make changes to medical advance directive? - - - - - - -  Would patient like information on creating a medical advance directive? No - Patient declined No - Patient declined - No - Patient declined Yes (MAU/Ambulatory/Procedural Areas - Information given) - No - Patient declined    Current Medications (verified) Outpatient Encounter Medications as of 08/15/2021  Medication Sig   alendronate (FOSAMAX) 70 MG tablet Take  with a full glass of water on an empty stomach.   allopurinol (ZYLOPRIM) 100 MG tablet Take 1 tablet (100 mg total) by mouth 2 (two) times daily.   carvedilol (COREG) 12.5 MG tablet Take 1 tablet (12.5 mg total) by mouth 2 (two) times daily with a meal.   donepezil (ARICEPT) 10 MG tablet Take 1 tablet (10 mg total) by mouth at bedtime. (Patient taking differently: Take 10 mg by mouth daily. In the morning)   empagliflozin (JARDIANCE) 25 MG TABS tablet Take 1 tablet (25 mg total) by mouth daily.   icosapent Ethyl (VASCEPA) 1 g capsule Take 1 capsule (1 g total) by mouth 2 (two) times daily.   losartan (COZAAR) 25 MG tablet Take 1 tablet (25 mg total) by mouth daily.   Melatonin 5 MG CAPS Take 5 mg by mouth at bedtime.    memantine (NAMENDA) 10 MG tablet Take 10 mg by mouth 2 (two) times daily.   MULTIPLE VITAMIN PO Take 1 tablet by mouth daily.   QUEtiapine (SEROQUEL) 100 MG tablet Take 1 tablet by mouth in the morning, at noon, and at bedtime. Taking 100mg  am, afternoon, and pm   rosuvastatin (CRESTOR) 20 MG tablet Take 1 tablet (20 mg total) by mouth daily.   SitaGLIPtin-MetFORMIN HCl (JANUMET XR) 50-1000 MG TB24 Take 2 tablets by mouth daily.   VITAMIN D PO Take 1,000 mg by mouth daily.    GLUCOSE BLOOD VI  (Patient not taking: Reported on 08/15/2021)   No facility-administered encounter medications on file as of 08/15/2021.    Allergies (verified) Ace inhibitors   History: Past Medical  History:  Diagnosis Date   Acute gout    Allergy ace inhibitors   Anemia of chronic disease    Anxiety    Basal cell carcinoma    Cataract    Depression, major, recurrent, mild ([place])    Diabetes mellitus without complication ([place])    Fibromyalgia syndrome    Hyperlipidemia    Hypertension    Osteoporosis    Retinopathy    Past Surgical History:  Procedure Laterality Date   CESAREAN SECTION     TONSILECTOMY, ADENOIDECTOMY, BILATERAL MYRINGOTOMY AND TUBES     TUBAL LIGATION     Family  History  Problem Relation Age of Onset   Hypertension Mother    Osteoporosis Mother    Cancer Mother        brain tumor   Hodgkin's lymphoma Father    Hypertension Sister    Cancer Sister        unknown   Hypertension Sister    Diabetes Sister    Cancer Sister 69       breast   Breast cancer Sister 69   Cancer Maternal Uncle    Social History   Socioeconomic History   Marital status: Divorced    Spouse name: Not on file   Number of children: 3   Years of education: Not on file   Highest education level: GED or equivalent  Occupational History    Employer: RETIRED  Tobacco Use   Smoking status: Never   Smokeless tobacco: Never   Tobacco comments:    smoking cessation materials not required  Vaping Use   Vaping Use: Never used  Substance and Sexual Activity   Alcohol use: No    Alcohol/week: 0.0 standard drinks   Drug use: No   Sexual activity: Not Currently  Other Topics Concern   Not on file  Social History Narrative   Pt lives alone, has aide that comes in the morning 5 days per week. Daughter [name] helps with meds and finances.    Social Determinants of Health   Financial Resource Strain: Low Risk    Difficulty of Paying Living Expenses: Not hard at all  Food Insecurity: No Food Insecurity   Worried About Charity fundraiser in the Last Year: Never true   [place] in the Last Year: Never true  Transportation Needs: No Transportation Needs   Lack of Transportation (Medical): No   Lack of Transportation (Non-Medical): No  Physical Activity: Inactive   Days of Exercise per Week: 0 days   Minutes of Exercise per Session: 0 min  Stress: Stress Concern Present   Feeling of Stress : To some extent  Social Connections: Moderately Isolated   Frequency of Communication with Friends and Family: More than three times a week   Frequency of Social Gatherings with Friends and Family: More than three times a week   Attends Religious Services: More than 4  times per year   Active Member of [employer] or Organizations: No   Attends Music therapist: Never   Marital Status: Divorced    Tobacco Counseling Counseling given: Not Answered Tobacco comments: smoking cessation materials not required   Clinical Intake:  Pre-visit preparation completed: Yes  Pain : No/denies pain     Nutritional Risks: None Diabetes: Yes CBG done?: No Did pt. bring in CBG monitor from home?: No  How often do you need to have someone help you when you read instructions, pamphlets, or other written materials from your  doctor or pharmacy?: 1 - Never  Nutrition Risk Assessment:  Has the patient had any N/V/D within the last 2 months?  No  Does the patient have any non-healing wounds?  No  Has the patient had any unintentional weight loss or weight gain?  No   Diabetes:  Is the patient diabetic?  Yes  If diabetic, was a CBG obtained today?  No  Did the patient bring in their glucometer from home?  No  How often do you monitor your CBG's? Pt does not actively check blood sugar.   Financial Strains and Diabetes Management:  Are you having any financial strains with the device, your supplies or your medication? No .  Does the patient want to be seen by Chronic Care Management for management of their diabetes?  No  Would the patient like to be referred to a Nutritionist or for Diabetic Management?  No   Diabetic Exams:  Diabetic Eye Exam: Completed 02/06/21 negative retinopathy.   Diabetic Foot Exam: Completed 08/29/20.   Interpreter Needed?: No  Information entered by :: Clemetine Marker LPN   Activities of Daily Living In your present state of health, do you have any difficulty performing the following activities: 08/15/2021 05/02/2021  Hearing? N N  Vision? N N  Difficulty concentrating or making decisions? [name]  Walking or climbing stairs? N N  Dressing or bathing? Y Y  Comment - -  Doing errands, shopping? [name]  Preparing Food and eating  ? N -  Using the Toilet? N -  In the past six months, have you accidently leaked urine? Y -  Comment wears depends -  Do you have problems with loss of bowel control? Y -  Managing your Medications? Y -  Managing your Finances? Y -  Housekeeping or managing your Housekeeping? Y -  Some recent data might be hidden    Patient Care Team: Steele Sizer, MD as PCP - General (Family Medicine) Mendel Corning, MD as Consulting Physician (Psychiatry) Anabel Bene, MD as Consulting Physician (Neurology) Germaine Pomfret, [facility] as Pharmacist (Pharmacist)  Indicate any recent Medical Services you may have received from other than [facility] providers in the past year (date may be approximate).     Assessment:   This is a routine wellness examination for Lindsey [name].  Hearing/Vision screen Hearing Screening - Comments:: Pt denies hearing difficulty Vision Screening - Comments:: Annual vision screenings at [facility]  Dietary issues and exercise activities discussed: Current Exercise Habits: The patient does not participate in regular exercise at present, Exercise limited by: neurologic condition(s)   Goals Addressed   None    Depression Screen PHQ 2/9 Scores 08/15/2021 05/02/2021 02/01/2021 08/29/2020 08/29/2020 07/12/2020 02/26/2020  PHQ - 2 Score 0 0 1 1 0 0 1  PHQ- 9 Score - - - 3 - - 3    Fall Risk Fall Risk  08/15/2021 05/02/2021 02/01/2021 08/29/2020 07/12/2020  Falls in the past year? 0 0 0 0 0  Number falls in past yr: 0 0 0 0 0  Comment - - - - -  Injury with Fall? 0 0 0 0 0  Risk for fall due to : No Fall Risks - - - No Fall Risks  Risk for fall due to: Comment - - - - -  Follow up Falls prevention discussed - - - Falls prevention discussed    FALL RISK PREVENTION PERTAINING TO THE HOME:  Any stairs in or around the home? Yes  If so, are there any without handrails? No  Home free of loose throw rugs in walkways, pet beds, electrical cords, etc? Yes  Adequate lighting  in your home to reduce risk of falls? Yes   ASSISTIVE DEVICES UTILIZED TO PREVENT FALLS:  Life alert? No  Use of a cane, walker or w/c? No  Grab bars in the bathroom? Yes  Shower chair or bench in shower? Yes  Elevated toilet seat or a handicapped toilet? No   TIMED UP AND GO:  Was the test performed? No . Telephonic visit.   Cognitive Function: Cognitive status assessed by direct observation. Patient has current diagnosis of cognitive impairment. Patient is followed by neurology for ongoing assessment.       6CIT Screen 07/04/2018 04/01/2017  What Year? 4 points 0 points  What month? 0 points 0 points  What time? 3 points 0 points  Count back from 20 0 points 0 points  Months in reverse 4 points 2 points  Repeat phrase 10 points 10 points  Total Score 21 12    Immunizations Immunization History  Administered Date(s) Administered   Fluad Quad(high Dose 65+) 07/09/2019   Influenza Split 03/06/2007   Influenza, High Dose Seasonal PF 07/01/2015, 08/10/2015, 08/06/2016, 08/02/2017, 07/04/2018, 07/04/2020   Influenza-Unspecified 11/19/2014   PFIZER(Purple Top)SARS-COV-2 Vaccination 12/11/2019, 01/01/2020, 08/22/2020   Pneumococcal Conjugate-13 04/06/2016   Pneumococcal Polysaccharide-23 09/03/2006, 11/19/2014   Tdap 11/19/2014   Zoster, Live 11/19/2014    TDAP status: Up to date  Flu Vaccine status: Due, Education has been provided regarding the importance of this vaccine. Advised may receive this vaccine at local pharmacy or Health Dept. Aware to provide a copy of the vaccination record if obtained from local pharmacy or Health Dept. Verbalized acceptance and understanding.  Pneumococcal vaccine status: Up to date  Covid-19 vaccine status: Completed vaccines  Qualifies for Shingles Vaccine? Yes   Zostavax completed Yes   Shingrix Completed?: No.    Education has been provided regarding the importance of this vaccine. Patient has been advised to call insurance company to  determine out of pocket expense if they have not yet received this vaccine. Advised may also receive vaccine at local pharmacy or Health Dept. Verbalized acceptance and understanding.  Screening Tests Health Maintenance  Topic Date Due   Zoster Vaccines- Shingrix (1 of 2) Never done   COVID-19 Vaccine (4 - Booster for Pfizer series) 10/17/2020   INFLUENZA VACCINE  05/15/2021   FOOT EXAM  08/29/2021   HEMOGLOBIN A1C  11/02/2021   OPHTHALMOLOGY EXAM  02/06/2022   COLONOSCOPY (Pts 45-41yrs Insurance coverage will need to be confirmed)  04/25/2023   TETANUS/TDAP  11/19/2024   Pneumonia Vaccine 91+ Years old  Completed   DEXA SCAN  Completed   Hepatitis C Screening  Completed   HPV VACCINES  Aged Out    Health Maintenance  Health Maintenance Due  Topic Date Due   Zoster Vaccines- Shingrix (1 of 2) Never done   COVID-19 Vaccine (4 - Booster for Pfizer series) 10/17/2020   INFLUENZA VACCINE  05/15/2021    Colorectal cancer screening: Type of screening: Colonoscopy. Completed 04/24/13. Repeat every 10 years  Mammogram status: No longer required due to cognitive decline.  Bone Density status: Completed 11/25/18. Results reflect: Bone density results: OSTEOPENIA. Repeat every 2 years.  Lung Cancer Screening: (Low Dose CT Chest recommended if Age 94-80 years, 30 pack-year currently smoking OR have quit w/in 15years.) does not qualify.   Additional Screening:  Hepatitis C Screening:  does qualify; Completed 12/16/15  Vision Screening: Recommended annual ophthalmology exams for early detection of glaucoma and other disorders of the eye. Is the patient up to date with their annual eye exam?  Yes  Who is the provider or what is the name of the office in which the patient attends annual eye exams? [facility].   Dental Screening: Recommended annual dental exams for proper oral hygiene  Community Resource Referral / Chronic Care Management: CRR required this visit?  No   CCM  required this visit?  No      Plan:     I have personally reviewed and noted the following in the patient's chart:   Medical and social history Use of alcohol, tobacco or illicit drugs  Current medications and supplements including opioid prescriptions.  Functional ability and status Nutritional status Physical activity Advanced directives List of other physicians Hospitalizations, surgeries, and ER visits in previous 12 months Vitals Screenings to include cognitive, depression, and falls Referrals and appointments  In addition, I have reviewed and discussed with patient certain preventive protocols, quality metrics, and best practice recommendations. A written personalized care plan for preventive services as well as general preventive health recommendations were provided to patient.     Clemetine Marker, LPN   [id number]   Nurse Notes: none

## 2021-08-15 NOTE — Patient Instructions (Signed)
Lindsey [name] , Thank you for taking time to come for your Medicare Wellness Visit. I appreciate your ongoing commitment to your health goals. Please review the following plan we discussed and let me know if I can assist you in the future.   Screening recommendations/referrals: Colonoscopy: done 04/24/13 Mammogram: no longer required Bone Density: done 11/25/18 Recommended yearly ophthalmology/optometry visit for glaucoma screening and checkup Recommended yearly dental visit for hygiene and checkup  Vaccinations: Influenza vaccine: due Pneumococcal vaccine: done 04/06/16 Tdap vaccine: done 11/19/14 Shingles vaccine: Shingrix discussed. Please contact your pharmacy for coverage information.  Covid-19: done 12/11/19, 01/01/20 & 08/22/20  Advanced directives: Please bring a copy of your health care power of attorney and living will to the office at your convenience once you have completed those documents.   Conditions/risks identified: Recommend increasing physical activity   Next appointment: Follow up in one year for your annual wellness visit    Preventive Care 65 Years and Older, Female Preventive care refers to lifestyle choices and visits with your health care provider that can promote health and wellness. What does preventive care include? A yearly physical exam. This is also called an annual well check. Dental exams once or twice a year. Routine eye exams. Ask your health care provider how often you should have your eyes checked. Personal lifestyle choices, including: Daily care of your teeth and gums. Regular physical activity. Eating a healthy diet. Avoiding tobacco and drug use. Limiting alcohol use. Practicing safe sex. Taking low-dose aspirin every day. Taking vitamin and mineral supplements as recommended by your health care provider. What happens during an annual well check? The services and screenings done by your health care provider during your annual well check will depend  on your age, overall health, lifestyle risk factors, and family history of disease. Counseling  Your health care provider may ask you questions about your: Alcohol use. Tobacco use. Drug use. Emotional well-being. Home and relationship well-being. Sexual activity. Eating habits. History of falls. Memory and ability to understand (cognition). Work and work Statistician. Reproductive health. Screening  You may have the following tests or measurements: Height, weight, and BMI. Blood pressure. Lipid and cholesterol levels. These may be checked every 5 years, or more frequently if you are over 42 years old. Skin check. Lung cancer screening. You may have this screening every year starting at age 48 if you have a 30-pack-year history of smoking and currently smoke or have quit within the past 15 years. Fecal occult blood test (FOBT) of the stool. You may have this test every year starting at age 48. Flexible sigmoidoscopy or colonoscopy. You may have a sigmoidoscopy every 5 years or a colonoscopy every 10 years starting at age 18. Hepatitis C blood test. Hepatitis B blood test. Sexually transmitted disease (STD) testing. Diabetes screening. This is done by checking your blood sugar (glucose) after you have not eaten for a while (fasting). You may have this done every 1-3 years. Bone density scan. This is done to screen for osteoporosis. You may have this done starting at age 12. Mammogram. This may be done every 1-2 years. Talk to your health care provider about how often you should have regular mammograms. Talk with your health care provider about your test results, treatment options, and if necessary, the need for more tests. Vaccines  Your health care provider may recommend certain vaccines, such as: Influenza vaccine. This is recommended every year. Tetanus, diphtheria, and acellular pertussis (Tdap, Td) vaccine. You may need a Td  booster every 10 years. Zoster vaccine. You may need  this after age 29. Pneumococcal 13-valent conjugate (PCV13) vaccine. One dose is recommended after age 70. Pneumococcal polysaccharide (PPSV23) vaccine. One dose is recommended after age 33. Talk to your health care provider about which screenings and vaccines you need and how often you need them. This information is not intended to replace advice given to you by your health care provider. Make sure you discuss any questions you have with your health care provider. Document Released: 10/28/2015 Document Revised: 06/20/2016 Document Reviewed: 08/02/2015 Elsevier Interactive Patient Education  2017 [place] Prevention in the Home Falls can cause injuries. They can happen to people of all ages. There are many things you can do to make your home safe and to help prevent falls. What can I do on the outside of my home? Regularly fix the edges of walkways and driveways and fix any cracks. Remove anything that might make you trip as you walk through a door, such as a raised step or threshold. Trim any bushes or trees on the path to your home. Use bright outdoor lighting. Clear any walking paths of anything that might make someone trip, such as rocks or tools. Regularly check to see if handrails are loose or broken. Make sure that both sides of any steps have handrails. Any raised decks and porches should have guardrails on the edges. Have any leaves, snow, or ice cleared regularly. Use sand or salt on walking paths during winter. Clean up any spills in your garage right away. This includes oil or grease spills. What can I do in the bathroom? Use night lights. Install grab bars by the toilet and in the tub and shower. Do not use towel bars as grab bars. Use non-skid mats or decals in the tub or shower. If you need to sit down in the shower, use a plastic, non-slip stool. Keep the floor dry. Clean up any water that spills on the floor as soon as it happens. Remove soap buildup in the tub  or shower regularly. Attach bath mats securely with double-sided non-slip rug tape. Do not have throw rugs and other things on the floor that can make you trip. What can I do in the bedroom? Use night lights. Make sure that you have a light by your bed that is easy to reach. Do not use any sheets or blankets that are too big for your bed. They should not hang down onto the floor. Have a firm chair that has side arms. You can use this for support while you get dressed. Do not have throw rugs and other things on the floor that can make you trip. What can I do in the kitchen? Clean up any spills right away. Avoid walking on wet floors. Keep items that you use a lot in easy-to-reach places. If you need to reach something above you, use a strong step stool that has a grab bar. Keep electrical cords out of the way. Do not use floor polish or wax that makes floors slippery. If you must use wax, use non-skid floor wax. Do not have throw rugs and other things on the floor that can make you trip. What can I do with my stairs? Do not leave any items on the stairs. Make sure that there are handrails on both sides of the stairs and use them. Fix handrails that are broken or loose. Make sure that handrails are as long as the stairways. Check any carpeting  to make sure that it is firmly attached to the stairs. Fix any carpet that is loose or worn. Avoid having throw rugs at the top or bottom of the stairs. If you do have throw rugs, attach them to the floor with carpet tape. Make sure that you have a light switch at the top of the stairs and the bottom of the stairs. If you do not have them, ask someone to add them for you. What else can I do to help prevent falls? Wear shoes that: Do not have high heels. Have rubber bottoms. Are comfortable and fit you well. Are closed at the toe. Do not wear sandals. If you use a stepladder: Make sure that it is fully opened. Do not climb a closed stepladder. Make  sure that both sides of the stepladder are locked into place. Ask someone to hold it for you, if possible. Clearly mark and make sure that you can see: Any grab bars or handrails. First and last steps. Where the edge of each step is. Use tools that help you move around (mobility aids) if they are needed. These include: Canes. Walkers. Scooters. Crutches. Turn on the lights when you go into a dark area. Replace any light bulbs as soon as they burn out. Set up your furniture so you have a clear path. Avoid moving your furniture around. If any of your floors are uneven, fix them. If there are any pets around you, be aware of where they are. Review your medicines with your doctor. Some medicines can make you feel dizzy. This can increase your chance of falling. Ask your doctor what other things that you can do to help prevent falls. This information is not intended to replace advice given to you by your health care provider. Make sure you discuss any questions you have with your health care provider. Document Released: 07/28/2009 Document Revised: 03/08/2016 Document Reviewed: 11/05/2014 Elsevier Interactive Patient Education  2017 [employer].

## 2021-08-30 ENCOUNTER — Telehealth: Payer: Self-pay

## 2021-08-30 NOTE — Progress Notes (Signed)
Chronic Care Management Pharmacy Assistant   Name: Lindsey [name]  MRN: [id number] DOB: 09-May-1951   Reason for Encounter:Diabetes Disease State Call.  Recent office visits:  08/15/2021 Clemetine Marker LPN (PCP Office) Medicare Wellness completed, No medication Changes noted.  Recent consult visits:  No recent [facility] visits:  None in previous 6 months  Medications: Outpatient Encounter Medications as of 08/30/2021  Medication Sig   alendronate (FOSAMAX) 70 MG tablet Take with a full glass of water on an empty stomach.   allopurinol (ZYLOPRIM) 100 MG tablet Take 1 tablet (100 mg total) by mouth 2 (two) times daily.   carvedilol (COREG) 12.5 MG tablet Take 1 tablet (12.5 mg total) by mouth 2 (two) times daily with a meal.   donepezil (ARICEPT) 10 MG tablet Take 1 tablet (10 mg total) by mouth at bedtime. (Patient taking differently: Take 10 mg by mouth daily. In the morning)   empagliflozin (JARDIANCE) 25 MG TABS tablet Take 1 tablet (25 mg total) by mouth daily.   GLUCOSE BLOOD VI  (Patient not taking: Reported on 08/15/2021)   icosapent Ethyl (VASCEPA) 1 g capsule Take 1 capsule (1 g total) by mouth 2 (two) times daily.   losartan (COZAAR) 25 MG tablet Take 1 tablet (25 mg total) by mouth daily.   Melatonin 5 MG CAPS Take 5 mg by mouth at bedtime.    memantine (NAMENDA) 10 MG tablet Take 10 mg by mouth 2 (two) times daily.   MULTIPLE VITAMIN PO Take 1 tablet by mouth daily.   QUEtiapine (SEROQUEL) 100 MG tablet Take 1 tablet by mouth in the morning, at noon, and at bedtime. Taking 100mg  am, afternoon, and pm   rosuvastatin (CRESTOR) 20 MG tablet Take 1 tablet (20 mg total) by mouth daily.   SitaGLIPtin-MetFORMIN HCl (JANUMET XR) 50-1000 MG TB24 Take 2 tablets by mouth daily.   VITAMIN D PO Take 1,000 mg by mouth daily.    No facility-administered encounter medications on file as of 08/30/2021.    Care Gaps: Zoster Vaccines- Shingrix COVID-19 Vaccine Dose  3 Influenza Vaccine  Foot Exam Star Rating Drug: Jardiance 25 mg last filled on 08/22/2021 for a 90-Day supply with [place] Losartan 25 mg last filled on 08/17/2021 for a 90-Day supply with [place] Rosuvastatin 20 mg last filled on 10/21//2022 for a 90-Day supply with [place] XR 50-1000 mg last filled on 08/18/2021 for a 90-Day supply with [place] Medication Fill Gaps: None ID  Recent Relevant Labs: Lab Results  Component Value Date/Time   HGBA1C 6.5 (A) 05/02/2021 03:30 PM   HGBA1C 6.8 (A) 08/29/2020 03:19 PM   HGBA1C 6.9 (H) 10/30/2019 09:24 AM   HGBA1C 7.4 (A) 04/22/2019 04:03 PM   HGBA1C 6.7 (H) 10/14/2018 10:01 AM   HGBA1C 6.9 07/04/2018 03:59 PM   MICROALBUR 0.3 05/02/2021 04:00 PM   MICROALBUR 17.9 08/29/2020 03:43 PM   MICROALBUR 20 08/02/2017 02:50 PM   MICROALBUR NEGATIVE 04/06/2016 08:24 AM    Kidney Function Lab Results  Component Value Date/Time   CREATININE 1.00 (H) 08/29/2020 03:43 PM   CREATININE 1.02 (H) 02/26/2020 04:30 PM   GFRNONAA 57 (L) 08/29/2020 03:43 PM   GFRAA 66 08/29/2020 03:43 PM    Current antihyperglycemic regimen:  Jardiance 25 mg daily  Janumet XR 50-1000 2 tablets daily  What recent interventions/DTPs have been made to improve glycemic control:  None ID Have there been any recent hospitalizations or ED visits since last  visit with CPP? No Patient denies hypoglycemic symptoms, including Pale, Sweaty, Shaky, Hungry, Nervous/irritable, and Vision changes Patient denies hyperglycemic symptoms, including blurry vision, excessive thirst, fatigue, polyuria, and weakness How often are you checking your blood sugar?  Patient daughter  states patient does not check her blood sugar at home. What are your blood sugars ranging?  None ID During the week, how often does your blood glucose drop below 70?  None ID Are you checking your feet daily/regularly?   Patient daughter states patient denies any  numbness,pain or tingling in her feet.   Adherence Review: Is the patient currently on a STATIN medication? Yes Is the patient currently on ACE/ARB medication? Yes Does the patient have >5 day gap between last estimated fill dates? No   Anderson [place] Clinical Production designer, theatre/television/film [phone]

## 2021-09-06 DIAGNOSIS — F32A Depression, unspecified: Secondary | ICD-10-CM | POA: Diagnosis not present

## 2021-09-24 ENCOUNTER — Other Ambulatory Visit: Payer: Self-pay | Admitting: Family Medicine

## 2021-09-24 DIAGNOSIS — E785 Hyperlipidemia, unspecified: Secondary | ICD-10-CM

## 2021-09-24 NOTE — Telephone Encounter (Signed)
Requested medication (s) are due for refill today: yes  Requested medication (s) are on the active medication list: yes  Last refill:  03/28/21 #180 1   Future visit scheduled: yes  Notes to clinic:  med is not assigned to a protocol   Requested Prescriptions  Pending Prescriptions Disp Refills   icosapent Ethyl (VASCEPA) 1 g capsule [Pharmacy Med Name: Icosapent Ethyl 1 GM Oral Capsule] 180 capsule 0    Sig: Take 1 capsule by mouth twice daily     Off-Protocol Failed - 09/24/2021  9:42 AM      Failed - Medication not assigned to a protocol, review manually.      Passed - Valid encounter within last 12 months    Recent Outpatient Visits           4 months ago Type 2 diabetes mellitus with diabetic cataract, without long-term current use of insulin [facility])   [facility] Steele Sizer, MD   7 months ago [facility] Delsa Grana, PA-C   1 year ago Type 2 diabetes mellitus with microalbuminuria, without long-term current use of insulin [facility])   [facility] Steele Sizer, MD   1 year ago Type 2 diabetes mellitus with microalbuminuria, without long-term current use of insulin [facility])   [facility] [place], Drue Stager, MD   1 year ago Type 2 diabetes mellitus with microalbuminuria, without long-term current use of insulin [facility])   [facility] Steele Sizer, MD       Future Appointments             In 1 month Ancil Boozer, Drue Stager, MD [facility], [place]   In 10 months  [facility], [facility]

## 2021-09-26 ENCOUNTER — Other Ambulatory Visit: Payer: Self-pay

## 2021-10-08 ENCOUNTER — Other Ambulatory Visit: Payer: Self-pay | Admitting: Family Medicine

## 2021-10-08 DIAGNOSIS — M8580 Other specified disorders of bone density and structure, unspecified site: Secondary | ICD-10-CM

## 2021-10-23 IMAGING — MG DIGITAL SCREENING BILAT W/ TOMO W/ CAD
8 series · 9 of 24 positions shown · non-contrast
Comparison: Previous exam(s).

CLINICAL DATA: Screening.

EXAM:
DIGITAL SCREENING BILATERAL MAMMOGRAM WITH TOMO AND CAD

[L CC synth-2D]
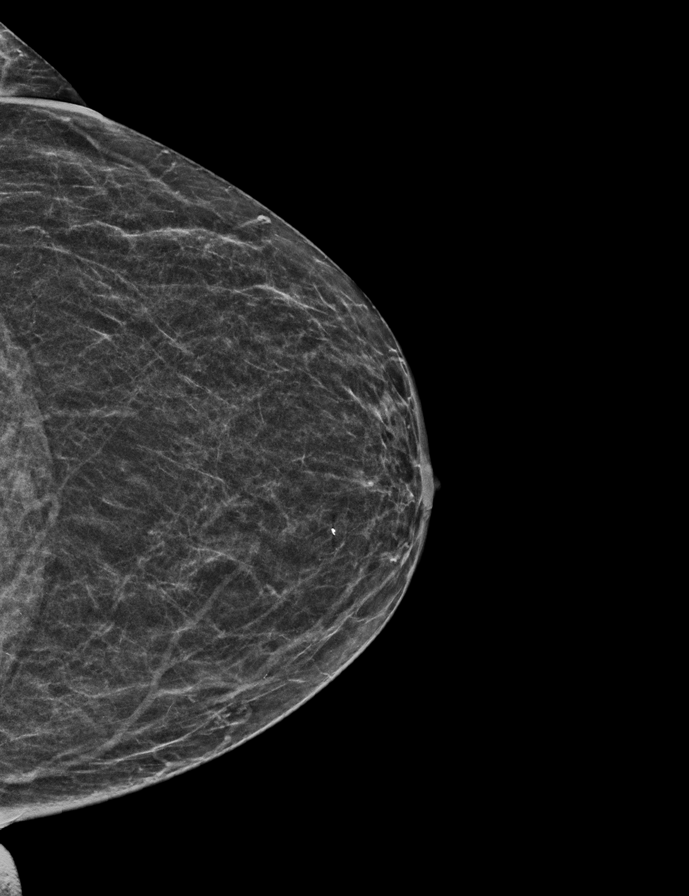

[L MLO synth-2D]
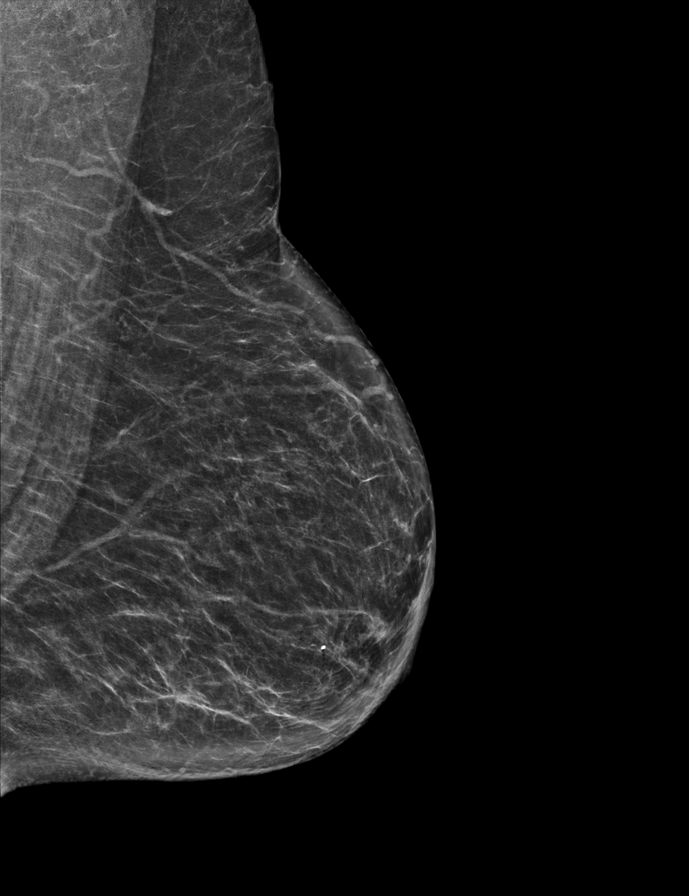

[R CC synth-2D]
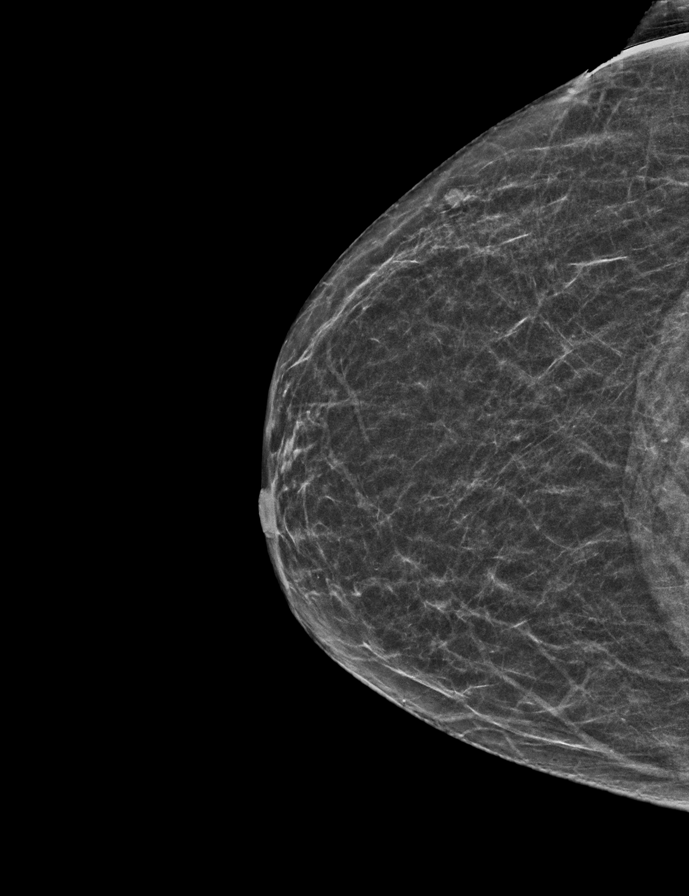

[R MLO synth-2D]
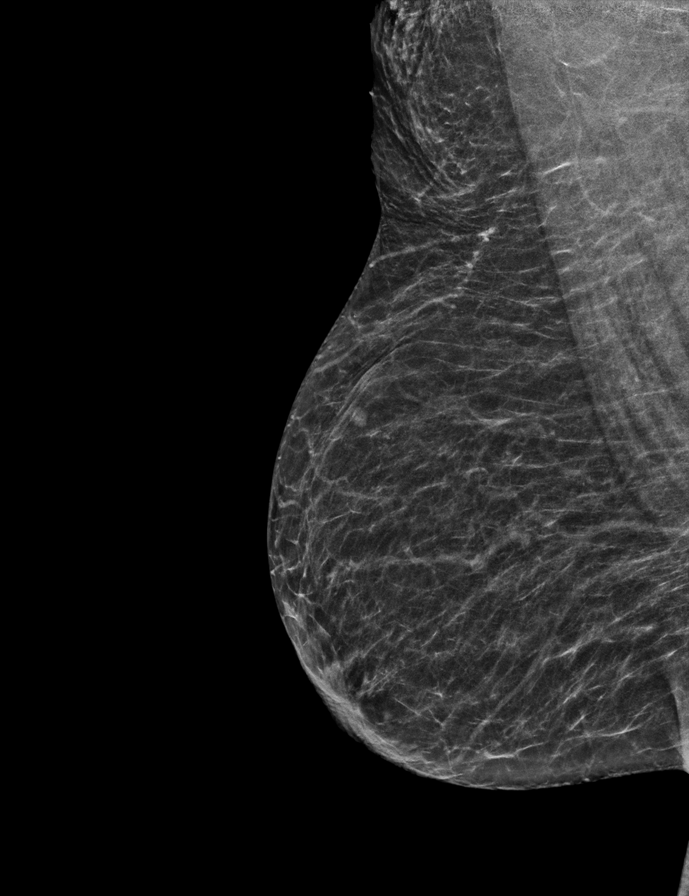

[R CC tomo · 2 of 45 frames shown]
[frame 15/45]
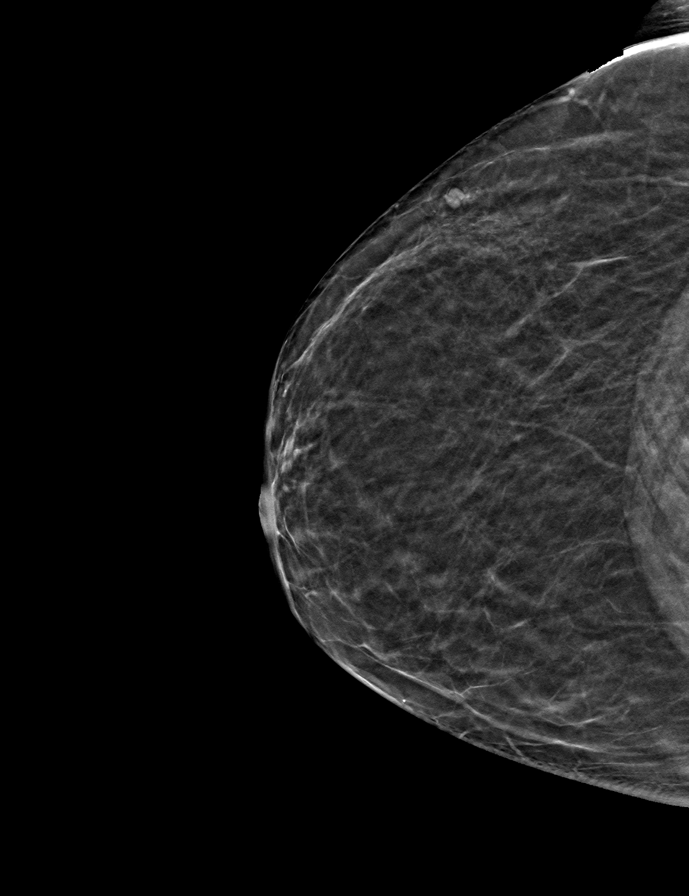
[frame 23/45]
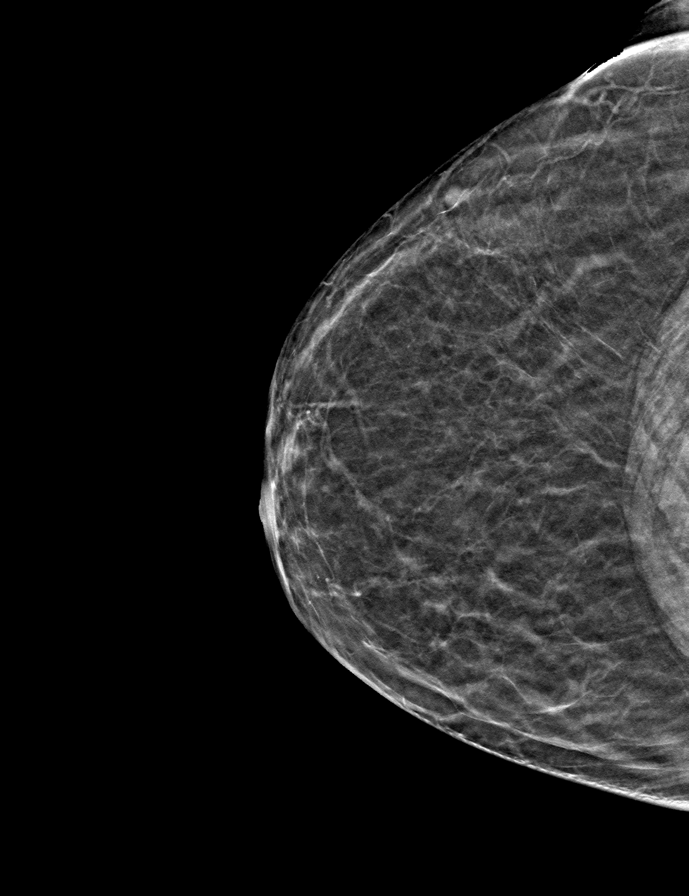

[R MLO tomo · tomo slice 27/52.0]
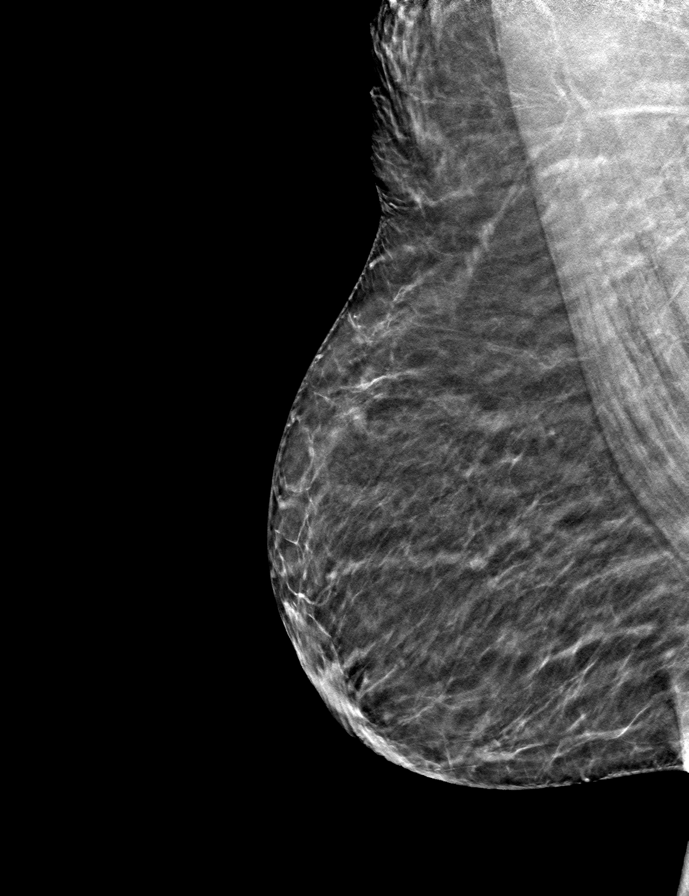

[L CC tomo · tomo slice 20/39.0]
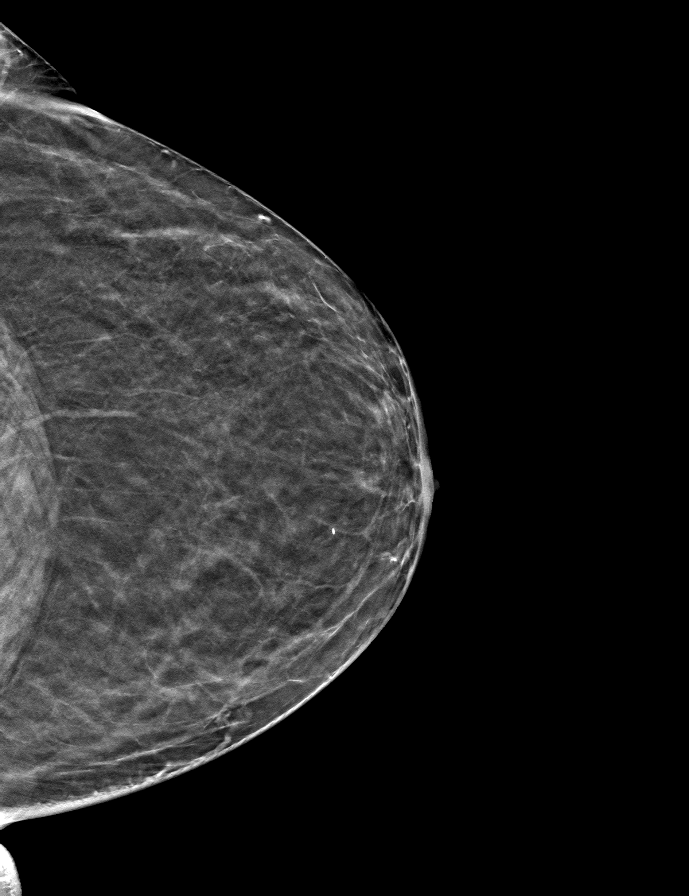

[L MLO tomo · tomo slice 34/67.0]
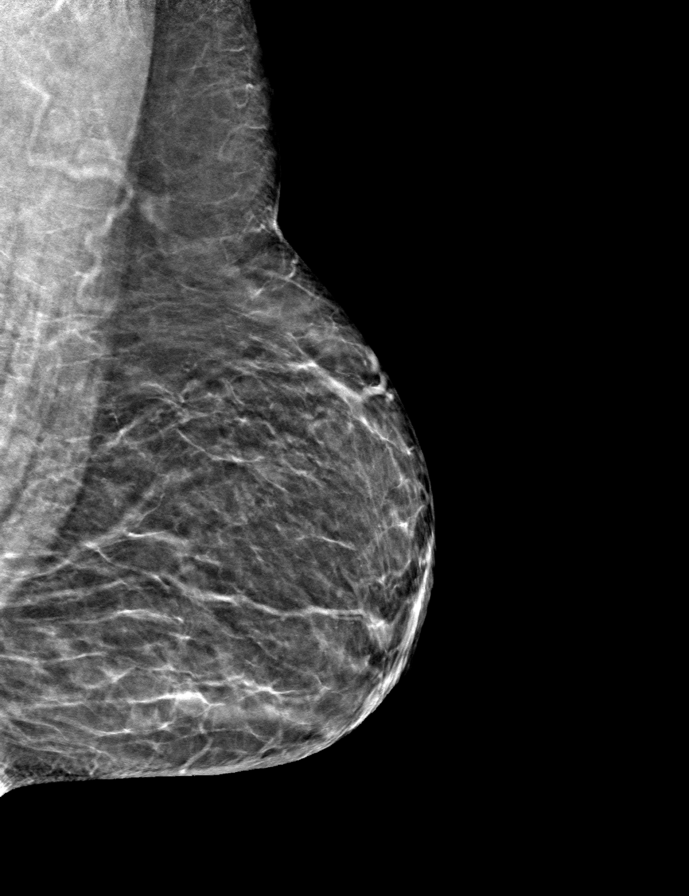

[9 of 24 positions shown; findings below may reference images not displayed]

ACR Breast Density Category b: There are scattered areas of
fibroglandular density.
FINDINGS: There are no findings suspicious for malignancy. Images were
processed with CAD.
IMPRESSION: No mammographic evidence of malignancy. A result letter of this
screening mammogram will be mailed directly to the patient.

RECOMMENDATION:
Screening mammogram in one year. (Code:CN-U-775)

BI-RADS CATEGORY  1: Negative.

## 2021-11-03 ENCOUNTER — Ambulatory Visit: Payer: Medicare Other | Admitting: Family Medicine

## 2021-11-08 NOTE — Progress Notes (Signed)
Name: Lindsey [name]   MRN: [id number]    DOB: October 23, 1950   Date:11/10/2021       Progress Note  Subjective  Chief Complaint  Follow Up  HPI  DMII:   HgbA1C has been out of control 8.1%, 8.2% 8.2% , 6.9%, 6.7% , 7.4% , down to 7 % ,6.8 % 6.5 %  and today is 7.1 %  She has an aid coming to her house daily for breakfast , she also has an aid during dinner time but not in the middle of the day, therefore does not seem to be eating lunch and has lost weight.  She denies polyphagia, polydipsia or polyuria. No recent episodes of hypoglycemia  She has obesity , CKI , dyslipidemia.she is on statin therapy and ARB. We will recheck labs today   Breast cancer screen/malnutrition: discussed with daughter and patient, due to her dementia and inability to make decisions on her own it would not be wise to let her go through cancer treatment we will not due any cancer screen. She is losing weight and likely from not eating lunch. Discussed protein shake but daughter states that she does not like it . She has lost almost 10 % in 6 months   Osteopenia: she was on Evista, but switched to Fosamax because of insurance changes. She is doing well, repeat bone density was done 11/2018 and stable. We will recheck kidney function   Auditory Hallucination: seen by Dr. Melrose [name] and also  by  Dr. Donalynn [name] - geriatrician. She is still taking Seroquel  100 mg TID  and 10 mg of  Aricept, and Namenda twice daily  the reason was increase in daytime episodes of hallucinations. She was also given Zoloft and lower dose was helping her not to worry all the time , currently on 50 mg    HTN:  denies dizziness.  No  chest pain but has some sob with some activity , she denies palpitation  She is compliant with medication - daughter dispenses it . She has some anxiety coming to the office and bp was high , she had normal bp during visit with geriatrician    Hyperlipidemia: taking Crestor and denies side effects of medication, including no  muscle ache. We will recheck labs today    Major Depression in remission: daughter does not think she is depressed but is anxious, gets frustrated for not remembering things at times. Taking Zoloft    Dementia: seen by Dr. Melrose [name], she states she continues to have auditory hallucinations, but not as frequent.  . She still lives alone, daughter is managing her bills, doctors visits, and she also dispenses her medications, she has an  Aid that spends the mornings with her, family members check on her during the evening . She forgets to eat and is losing weight.  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 ([place]) 01/09/2016   Mild dementia 11/01/2015   Auditory hallucination 07/26/2015   Anemia of chronic disease 07/05/2015   Cataract 04/06/2015   Type 2 diabetes mellitus with diabetic cataract ([place]) 04/06/2015   Benign essential HTN 04/02/2015   Controlled gout 04/02/2015   Dyslipidemia 04/02/2015   Depression, major, recurrent, mild (HCC) 04/02/2015   Fibromyalgia syndrome 04/02/2015   Chronic insomnia 04/02/2015   Cognitive decline 04/02/2015   Nodular basal cell carcinoma  04/02/2015   Morbid obesity ([place]) 04/02/2015   Osteopenia 04/02/2015   Calcium blood increased 04/02/2015    Past Surgical History:  Procedure Laterality Date   CESAREAN SECTION     TONSILECTOMY, ADENOIDECTOMY, BILATERAL MYRINGOTOMY AND TUBES     TUBAL LIGATION      Family History  Problem Relation Age of Onset   Hypertension Mother    Osteoporosis Mother    Cancer Mother        brain tumor   Hodgkin's lymphoma Father    Hypertension Sister    Cancer Sister        unknown   Hypertension Sister    Diabetes Sister    Cancer Sister 45       breast   Breast cancer Sister 5   Cancer Maternal Uncle     Social History   Tobacco Use   Smoking  status: Never   Smokeless tobacco: Never   Tobacco comments:    smoking cessation materials not required  Substance Use Topics   Alcohol use: No    Alcohol/week: 0.0 standard drinks     Current Outpatient Medications:    sertraline (ZOLOFT) 50 MG tablet, Take 50 mg by mouth every evening., Disp: , Rfl:    alendronate (FOSAMAX) 70 MG tablet, Take with a full glass of water on an empty stomach., Disp: 12 tablet, Rfl: 3   allopurinol (ZYLOPRIM) 100 MG tablet, Take 1 tablet (100 mg total) by mouth 2 (two) times daily., Disp: 180 tablet, Rfl: 1   carvedilol (COREG) 12.5 MG tablet, Take 1 tablet (12.5 mg total) by mouth 2 (two) times daily with a meal., Disp: 180 tablet, Rfl: 1   donepezil (ARICEPT) 10 MG tablet, Take 1 tablet (10 mg total) by mouth at bedtime. (Patient taking differently: Take 10 mg by mouth daily. In the morning), Disp: 90 tablet, Rfl: 1   empagliflozin (JARDIANCE) 25 MG TABS tablet, Take 1 tablet (25 mg total) by mouth daily., Disp: 90 tablet, Rfl: 1   GLUCOSE BLOOD VI, , Disp: , Rfl:    icosapent Ethyl (VASCEPA) 1 g capsule, Take 1 capsule (1 g total) by mouth 2 (two) times daily., Disp: 180 capsule, Rfl: 1   losartan (COZAAR) 25 MG tablet, Take 1 tablet (25 mg total) by mouth daily., Disp: 90 tablet, Rfl: 1   Melatonin 5 MG CAPS, Take 5 mg by mouth at bedtime. , Disp: , Rfl:    memantine (NAMENDA) 10 MG tablet, Take 10 mg by mouth 2 (two) times daily., Disp: , Rfl:    MULTIPLE VITAMIN PO, Take 1 tablet by mouth daily., Disp: , Rfl:    QUEtiapine (SEROQUEL) 100 MG tablet, Take 1 tablet by mouth in the morning, at noon, and at bedtime. Taking 100mg  am, afternoon, and pm, Disp: , Rfl:    rosuvastatin (CRESTOR) 20 MG tablet, Take 1 tablet (20 mg total) by mouth daily., Disp: 90 tablet, Rfl: 1   SitaGLIPtin-MetFORMIN HCl (JANUMET XR) 50-1000 MG TB24, Take 2 tablets by mouth daily., Disp: 180 tablet, Rfl: 1   VITAMIN D PO, Take 1,000 mg by mouth daily. , Disp: , Rfl:   Allergies   Allergen Reactions   Ace Inhibitors     I personally reviewed active problem list, medication list, allergies, family history, social history, health maintenance with the patient/caregiver today.   ROS  Constitutional: Negative for fever , positive weight change.  Respiratory: Negative for cough and shortness of breath.   Cardiovascular: Negative for chest  pain or palpitations.  Gastrointestinal: Negative for abdominal pain, no bowel changes.  Musculoskeletal: Negative for gait problem or joint swelling.  Skin: Negative for rash.  Neurological: Negative for dizziness or headache.  No other specific complaints in a complete review of systems (except as listed in HPI above).   Objective  Vitals:   11/10/21 1449  BP: 128/68  Pulse: 97  Resp: 16  Temp: 97.9 F (36.6 C)  TempSrc: Oral  SpO2: 98%  Weight: 137 lb 14.4 oz (62.6 kg)  Height: 4\' 11"  (1.499 m)    Body mass index is 27.85 kg/m.  Physical Exam  Constitutional: Patient appears well-developed and malnourished with temporal waisting No distress.  HEENT: head atraumatic, normocephalic, pupils equal and reactive to light, neck supple, Cardiovascular: Normal rate, regular rhythm and normal heart sounds.  No murmur heard. No BLE edema. Pulmonary/Chest: Effort normal and breath sounds normal. No respiratory distress. Abdominal: Soft.  There is no tenderness. Psychiatric: Patient has a normal mood and affect. Calm and daughter does most of the talking for her and needs to correct some of her comments for example : Where did you get your nails done ?  I did it at home. Daughter corrected and said : we went to the salon in Dec remember?   Recent Results (from the past 2160 hour(s))  POCT HgB A1C     Status: Abnormal   Collection Time: 11/10/21  2:57 PM  Result Value Ref Range   Hemoglobin A1C 7.1 (A) 4.0 - 5.6 %   HbA1c POC (<> result, manual entry)     HbA1c, POC (prediabetic range)     HbA1c, POC (controlled diabetic  range)      Diabetic Foot Exam - Simple   Simple Foot Form Visual Inspection No deformities, no ulcerations, no other skin breakdown bilaterally: Yes Sensation Testing Intact to touch and monofilament testing bilaterally: Yes Pulse Check Posterior Tibialis and Dorsalis pulse intact bilaterally: Yes Comments      PHQ2/9: Depression screen [facility] 2/9 11/10/2021 08/15/2021 05/02/2021 02/01/2021 08/29/2020  Decreased Interest 0 0 0 1 1  Down, Depressed, Hopeless 0 0 0 0 0  PHQ - 2 Score 0 0 0 1 1  Altered sleeping 0 - - - 0  Tired, decreased energy 0 - - - 0  Change in appetite 0 - - - 1  Feeling bad or failure about yourself  0 - - - 0  Trouble concentrating 0 - - - 1  Moving slowly or fidgety/restless 0 - - - 0  Suicidal thoughts 0 - - - 0  PHQ-9 Score 0 - - - 3  Difficult doing work/chores Not difficult at all - - - Somewhat difficult  Some recent data might be hidden    phq 9 is negative   Fall Risk: Fall Risk  11/10/2021 08/15/2021 05/02/2021 02/01/2021 08/29/2020  Falls in the past year? 0 0 0 0 0  Number falls in past yr: 0 0 0 0 0  Comment - - - - -  Injury with Fall? 0 0 0 0 0  Risk for fall due to : No Fall Risks No Fall Risks - - -  Risk for fall due to: Comment - - - - -  Follow up Falls prevention discussed Falls prevention discussed - - -      Functional Status Survey: Is the patient deaf or have difficulty hearing?: No Does the patient have difficulty seeing, even when wearing glasses/contacts?: No Does the patient have  difficulty concentrating, remembering, or making decisions?: Yes Does the patient have difficulty walking or climbing stairs?: No Does the patient have difficulty dressing or bathing?: Yes Does the patient have difficulty doing errands alone such as visiting a doctor's office or shopping?: No    Assessment & Plan  1. Type 2 diabetes mellitus with microalbuminuria, without long-term current use of insulin (HCC)  - POCT HgB A1C - HM Diabetes  Foot Exam - Microalbumin / creatinine urine ratio - empagliflozin (JARDIANCE) 25 MG TABS tablet; Take 1 tablet (25 mg total) by mouth daily.  Dispense: 90 tablet; Refill: 1 - losartan (COZAAR) 25 MG tablet; Take 1 tablet (25 mg total) by mouth daily.  Dispense: 90 tablet; Refill: 1 - SitaGLIPtin-MetFORMIN HCl (JANUMET XR) 50-1000 MG TB24; Take 2 tablets by mouth daily.  Dispense: 180 tablet; Refill: 1  2. Stage 3a chronic kidney disease (HCC)  - Microalbumin / creatinine urine ratio - VITAMIN D 25 Hydroxy (Vit-D Deficiency, Fractures)  3. Mild protein-calorie malnutrition ([place])  She is visibly thinner and has temporal waisting  4. Overweight     5. Dementia due to medical condition without behavioral disturbance ([place])   6. Benign essential HTN  - CBC with Differential/Platelet - COMPLETE METABOLIC PANEL WITH GFR - carvedilol (COREG) 12.5 MG tablet; Take 1 tablet (12.5 mg total) by mouth 2 (two) times daily with a meal.  Dispense: 180 tablet; Refill: 1  7. Dyslipidemia  - Lipid panel - icosapent Ethyl (VASCEPA) 1 g capsule; Take 1 capsule (1 g total) by mouth 2 (two) times daily.  Dispense: 180 capsule; Refill: 1 - rosuvastatin (CRESTOR) 20 MG tablet; Take 1 tablet (20 mg total) by mouth daily.  Dispense: 90 tablet; Refill: 1  8. History of iron deficiency anemia  - CBC with Differential/Platelet  9. Controlled gout  - allopurinol (ZYLOPRIM) 100 MG tablet; Take 1 tablet (100 mg total) by mouth 2 (two) times daily.  Dispense: 180 tablet; Refill: 1  10. Weight loss  - TSH  11. Osteopenia, unspecified location  - alendronate (FOSAMAX) 70 MG tablet; Take with a full glass of water on an empty stomach.  Dispense: 12 tablet; Refill: 3  12. Long-term use of high-risk medication  - Vitamin B12

## 2021-11-10 ENCOUNTER — Ambulatory Visit (INDEPENDENT_AMBULATORY_CARE_PROVIDER_SITE_OTHER): Payer: Medicare Other | Admitting: Family Medicine

## 2021-11-10 ENCOUNTER — Encounter: Payer: Self-pay | Admitting: Family Medicine

## 2021-11-10 VITALS — BP 128/68 | HR 97 | Temp 97.9°F | Resp 16 | Ht 59.0 in | Wt 137.9 lb

## 2021-11-10 DIAGNOSIS — E441 Mild protein-calorie malnutrition: Secondary | ICD-10-CM

## 2021-11-10 DIAGNOSIS — I10 Essential (primary) hypertension: Secondary | ICD-10-CM

## 2021-11-10 DIAGNOSIS — Z23 Encounter for immunization: Secondary | ICD-10-CM | POA: Diagnosis not present

## 2021-11-10 DIAGNOSIS — E663 Overweight: Secondary | ICD-10-CM

## 2021-11-10 DIAGNOSIS — F0280 Dementia in other diseases classified elsewhere without behavioral disturbance: Secondary | ICD-10-CM

## 2021-11-10 DIAGNOSIS — N1831 Chronic kidney disease, stage 3a: Secondary | ICD-10-CM

## 2021-11-10 DIAGNOSIS — R809 Proteinuria, unspecified: Secondary | ICD-10-CM | POA: Diagnosis not present

## 2021-11-10 DIAGNOSIS — E785 Hyperlipidemia, unspecified: Secondary | ICD-10-CM

## 2021-11-10 DIAGNOSIS — Z79899 Other long term (current) drug therapy: Secondary | ICD-10-CM

## 2021-11-10 DIAGNOSIS — R634 Abnormal weight loss: Secondary | ICD-10-CM

## 2021-11-10 DIAGNOSIS — Z862 Personal history of diseases of the blood and blood-forming organs and certain disorders involving the immune mechanism: Secondary | ICD-10-CM

## 2021-11-10 DIAGNOSIS — E1129 Type 2 diabetes mellitus with other diabetic kidney complication: Secondary | ICD-10-CM | POA: Diagnosis not present

## 2021-11-10 DIAGNOSIS — M8580 Other specified disorders of bone density and structure, unspecified site: Secondary | ICD-10-CM

## 2021-11-10 DIAGNOSIS — M109 Gout, unspecified: Secondary | ICD-10-CM

## 2021-11-10 LAB — POCT GLYCOSYLATED HEMOGLOBIN (HGB A1C): Hemoglobin A1C: 7.1 % — AB (ref 4.0–5.6)

## 2021-11-10 MED ORDER — JANUMET XR 50-1000 MG PO TB24: 2.0000 | ORAL_TABLET | Freq: Every day | ORAL | 1 refills | Status: DC

## 2021-11-10 MED ORDER — ROSUVASTATIN CALCIUM 20 MG PO TABS: 20.0000 mg | ORAL_TABLET | Freq: Every day | ORAL | 1 refills | Status: DC

## 2021-11-10 MED ORDER — CARVEDILOL 12.5 MG PO TABS: 12.5000 mg | ORAL_TABLET | Freq: Two times a day (BID) | ORAL | 1 refills | Status: DC

## 2021-11-10 MED ORDER — ALENDRONATE SODIUM 70 MG PO TABS: ORAL_TABLET | ORAL | 3 refills | Status: DC

## 2021-11-10 MED ORDER — EMPAGLIFLOZIN 25 MG PO TABS: 25.0000 mg | ORAL_TABLET | Freq: Every day | ORAL | 1 refills | Status: DC

## 2021-11-10 MED ORDER — ICOSAPENT ETHYL 1 G PO CAPS: 1.0000 g | ORAL_CAPSULE | Freq: Two times a day (BID) | ORAL | 1 refills | Status: DC

## 2021-11-10 MED ORDER — ALLOPURINOL 100 MG PO TABS: 100.0000 mg | ORAL_TABLET | Freq: Two times a day (BID) | ORAL | 1 refills | Status: DC

## 2021-11-10 MED ORDER — LOSARTAN POTASSIUM 25 MG PO TABS: 25.0000 mg | ORAL_TABLET | Freq: Every day | ORAL | 1 refills | Status: DC

## 2021-11-11 LAB — COMPLETE METABOLIC PANEL WITH GFR
AG Ratio: 2 (calc) (ref 1.0–2.5)
ALT: 28 U/L (ref 6–29)
AST: 36 U/L — ABNORMAL HIGH (ref 10–35)
Albumin: 4 g/dL (ref 3.6–5.1)
Alkaline phosphatase (APISO): 60 U/L (ref 37–153)
BUN/Creatinine Ratio: 10 (calc) (ref 6–22)
BUN: 12 mg/dL (ref 7–25)
CO2: 28 mmol/L (ref 20–32)
Calcium: 10.3 mg/dL (ref 8.6–10.4)
Chloride: 105 mmol/L (ref 98–110)
Creat: 1.21 mg/dL — ABNORMAL HIGH (ref 0.60–1.00)
Globulin: 2 g/dL (calc) (ref 1.9–3.7)
Glucose, Bld: 138 mg/dL — ABNORMAL HIGH (ref 65–99)
Potassium: 3.7 mmol/L (ref 3.5–5.3)
Sodium: 142 mmol/L (ref 135–146)
Total Bilirubin: 0.5 mg/dL (ref 0.2–1.2)
Total Protein: 6 g/dL — ABNORMAL LOW (ref 6.1–8.1)
eGFR: 48 mL/min/{1.73_m2} — ABNORMAL LOW (ref >=60)

## 2021-11-11 LAB — CBC WITH DIFFERENTIAL/PLATELET
Absolute Monocytes: 460 cells/uL (ref 200–950)
Basophils Absolute: 50 cells/uL (ref 0–200)
Basophils Relative: 0.8 %
Eosinophils Absolute: 151 cells/uL (ref 15–500)
Eosinophils Relative: 2.4 %
HCT: 36.3 % (ref 35.0–45.0)
Hemoglobin: 11.7 g/dL (ref 11.7–15.5)
Lymphs Abs: 2633 cells/uL (ref 850–3900)
MCH: 28.7 pg (ref 27.0–33.0)
MCHC: 32.2 g/dL (ref 32.0–36.0)
MCV: 89.2 fL (ref 80.0–100.0)
MPV: 12.8 fL — ABNORMAL HIGH (ref 7.5–12.5)
Monocytes Relative: 7.3 %
Neutro Abs: 3005 cells/uL (ref 1500–7800)
Neutrophils Relative %: 47.7 %
Platelets: 161 10*3/uL (ref 140–400)
RBC: 4.07 10*6/uL (ref 3.80–5.10)
RDW: 14 % (ref 11.0–15.0)
Total Lymphocyte: 41.8 %
WBC: 6.3 10*3/uL (ref 3.8–10.8)

## 2021-11-11 LAB — LIPID PANEL
Cholesterol: 100 mg/dL (ref <200)
HDL: 41 mg/dL — ABNORMAL LOW (ref >=50)
LDL Cholesterol (Calc): 35 mg/dL (calc)
Non-HDL Cholesterol (Calc): 59 mg/dL (calc) (ref <130)
Total CHOL/HDL Ratio: 2.4 (calc) (ref <5.0)
Triglycerides: 159 mg/dL — ABNORMAL HIGH (ref <150)

## 2021-11-11 LAB — VITAMIN D 25 HYDROXY (VIT D DEFICIENCY, FRACTURES): Vit D, 25-Hydroxy: 60 ng/mL (ref 30–100)

## 2021-11-11 LAB — TSH: TSH: 3.83 mIU/L (ref 0.40–4.50)

## 2021-11-11 LAB — VITAMIN B12: Vitamin B-12: 328 pg/mL (ref 200–1100)

## 2021-11-15 LAB — MICROALBUMIN / CREATININE URINE RATIO
Creatinine, Urine: 29 mg/dL (ref 20–275)
Microalb Creat Ratio: 31 mcg/mg creat — ABNORMAL HIGH (ref <30)
Microalb, Ur: 0.9 mg/dL

## 2021-12-12 ENCOUNTER — Telehealth: Payer: Self-pay

## 2021-12-12 NOTE — Progress Notes (Signed)
Chronic Care Management APPOINTMENT REMINDER   Called Lindsey [name], No answer, left message of appointment on 12/13/2021 at 3:00 pm via telephone visit with Junius Argyle , Pharm D. Notified to have all medications, supplements, blood pressure and/or blood sugar logs available during appointment and to return call if need to reschedule.    [place] Pharmacist Assistant [phone]

## 2021-12-13 ENCOUNTER — Telehealth: Payer: Self-pay

## 2021-12-13 NOTE — Progress Notes (Unsigned)
Chronic Care Management Pharmacy Note  12/13/2021 Name:  Lindsey [name] MRN:  [id number] DOB:  [birth year]  Summary: Patient presents for CCM follow-up. Today's visit was 100% conducted with [name], the patient's daughter.   Recommendations/Changes made from today's visit: Continue current medications   Plan: CPP follow-up in 8 months   Recommended Problem List Changes:  Add: Chronic Kidney Disease stage 3a  Modify:  *** Remove:  ***   Subjective: Lindsey [name] is an 71 y.o. year old female who is a primary patient of Sowles, Drue Stager, MD.  The CCM team was consulted for assistance with disease management and care coordination needs.    Engaged with patient by telephone for follow up visit in response to provider referral for pharmacy case management and/or care coordination services.   Consent to Services:  The patient was given information about Chronic Care Management services, agreed to services, and gave verbal consent prior to initiation of services.  Please see initial visit note for detailed documentation.   Patient Care Team: Steele Sizer, MD as PCP - General (Family Medicine) Mendel Corning, MD as Consulting Physician (Psychiatry) Anabel Bene, MD as Consulting Physician (Neurology) Germaine Pomfret, [facility] as Pharmacist (Pharmacist)  Recent office visits: 11/10/21: Patient presented to Dr. Ancil Boozer for follow-up.  08/15/22: Patient presented to Clemetine Marker, LPN for AWV.  Recent consult visits: 12/06/21: Patient presents to Dr. Donalynn Furlong (Geriatrics) for follow-up.  09/06/22: Patient presents to Dr. Donalynn Furlong (Geriatrics) for follow-up.   Hospital visits: None in previous 6 months   Objective:  Lab Results  Component Value Date   CREATININE 1.21 (H) 11/10/2021   BUN 12 11/10/2021   GFRNONAA 57 (L) 08/29/2020   GFRAA 66 08/29/2020   NA 142 11/10/2021   K 3.7 11/10/2021   CALCIUM 10.3 11/10/2021   CO2 28 11/10/2021   GLUCOSE 138 (H) 11/10/2021     Lab Results  Component Value Date/Time   HGBA1C 7.1 (A) 11/10/2021 02:57 PM   HGBA1C 6.5 (A) 05/02/2021 03:30 PM   HGBA1C 6.9 (H) 10/30/2019 09:24 AM   HGBA1C 7.4 (A) 04/22/2019 04:03 PM   HGBA1C 6.7 (H) 10/14/2018 10:01 AM   HGBA1C 6.9 07/04/2018 03:59 PM   MICROALBUR 0.9 11/14/2021 04:17 PM   MICROALBUR 0.3 05/02/2021 04:00 PM   MICROALBUR 20 08/02/2017 02:50 PM   MICROALBUR NEGATIVE 04/06/2016 08:24 AM    Last diabetic Eye exam:  Lab Results  Component Value Date/Time   HMDIABEYEEXA No Retinopathy 02/06/2021 12:00 AM    Last diabetic Foot exam: No results found for: HMDIABFOOTEX   Lab Results  Component Value Date   CHOL 100 11/10/2021   HDL 41 (L) 11/10/2021   LDLCALC 35 11/10/2021   TRIG 159 (H) 11/10/2021   CHOLHDL 2.4 11/10/2021    Hepatic Function Latest Ref Rng & Units 11/10/2021 08/29/2020 02/26/2020  Total Protein 6.1 - 8.1 g/dL 6.0(L) 7.0 6.2  Albumin 3.5 - 5.0 g/dL - - -  AST 10 - 35 U/L 36(H) 20 17  ALT 6 - 29 U/L '28 17 11  ' Alk Phosphatase 38 - 126 U/L - - -  Total Bilirubin 0.2 - 1.2 mg/dL 0.5 0.6 0.5    Lab Results  Component Value Date/Time   TSH 3.83 11/10/2021 03:42 PM   TSH 3.67 08/29/2020 03:43 PM    CBC Latest Ref Rng & Units 11/10/2021 08/29/2020 02/26/2020  WBC 3.8 - 10.8 Thousand/uL 6.3 9.3 7.5  Hemoglobin 11.7 - 15.5 g/dL 11.7 13.1 11.0(L)  Hematocrit 35.0 - 45.0 % 36.3 39.8 33.5(L)  Platelets 140 - 400 Thousand/uL 161 253 253    Lab Results  Component Value Date/Time   VD25OH 60 11/10/2021 03:42 PM   VD25OH 58 08/29/2020 03:43 PM    Clinical ASCVD: No  The ASCVD Risk score (Arnett DK, et al., 2019) failed to calculate for the following reasons:   The valid total cholesterol range is 130 to 320 mg/dL    Depression screen [facility] 2/9 11/10/2021 08/15/2021 05/02/2021  Decreased Interest 0 0 0  Down, Depressed, Hopeless 0 0 0  PHQ - 2 Score 0 0 0  Altered sleeping 0 - -  Tired, decreased energy 0 - -  Change in appetite 0 - -   Feeling bad or failure about yourself  0 - -  Trouble concentrating 0 - -  Moving slowly or fidgety/restless 0 - -  Suicidal thoughts 0 - -  PHQ-9 Score 0 - -  Difficult doing work/chores Not difficult at all - -  Some recent data might be hidden    Social History   Tobacco Use  Smoking Status Never  Smokeless Tobacco Never  Tobacco Comments   smoking cessation materials not required   BP Readings from Last 3 Encounters:  11/10/21 128/68  05/02/21 (!) 142/80  02/01/21 (!) 142/86   Pulse Readings from Last 3 Encounters:  11/10/21 97  05/02/21 86  02/01/21 99   Wt Readings from Last 3 Encounters:  11/10/21 137 lb 14.4 oz (62.6 kg)  05/02/21 152 lb (68.9 kg)  02/01/21 148 lb 6.4 oz (67.3 kg)   BMI Readings from Last 3 Encounters:  11/10/21 27.85 kg/m  05/02/21 30.70 kg/m  02/01/21 29.97 kg/m    Assessment/Interventions: Review of patient past medical history, allergies, medications, health status, including review of consultants reports, laboratory and other test data, was performed as part of comprehensive evaluation and provision of chronic care management services.   SDOH:  (Social Determinants of Health) assessments and interventions performed: Yes   SDOH Screenings   Alcohol Screen: Low Risk    Last Alcohol Screening Score (AUDIT): 0  Depression (PHQ2-9): Low Risk    PHQ-2 Score: 0  Financial Resource Strain: Low Risk    Difficulty of Paying Living Expenses: Not hard at all  Food Insecurity: No Food Insecurity   Worried About Charity fundraiser in the Last Year: Never true   Ran Out of Food in the Last Year: Never true  Housing: Low Risk    Last Housing Risk Score: 0  Physical Activity: Inactive   Days of Exercise per Week: 0 days   Minutes of Exercise per Session: 0 min  Social Connections: Moderately Isolated   Frequency of Communication with Friends and Family: More than three times a week   Frequency of Social Gatherings with Friends and Family:  More than three times a week   Attends Religious Services: More than 4 times per year   Active Member of [employer] or Organizations: No   Attends Archivist Meetings: Never   Marital Status: Divorced  Stress: Stress Concern Present   Feeling of Stress : To some extent  Tobacco Use: Low Risk    Smoking Tobacco Use: Never   Smokeless Tobacco Use: Never   Passive Exposure: Not on file  Transportation Needs: No Transportation Needs   Lack of Transportation (Medical): No   Lack of Transportation (Non-Medical): No    CCM Care Plan  Allergies  Allergen Reactions  Ace Inhibitors     Medications Reviewed Today     Reviewed by Steele Sizer, MD (Physician) on 11/10/21 at [place] List Status: <None>   Medication Order Taking? Sig Documenting Provider Last Dose Status Informant  alendronate (FOSAMAX) 70 MG tablet [id number]  TAKE WITH A FULL GLASS OF WATER ON AN EMPTY STOMACH Ancil Boozer, Drue Stager, MD  Active   allopurinol (ZYLOPRIM) 100 MG tablet [id number]  Take 1 tablet (100 mg total) by mouth 2 (two) times daily. Steele Sizer, MD  Active   carvedilol (COREG) 12.5 MG tablet [id number]  Take 1 tablet (12.5 mg total) by mouth 2 (two) times daily with a meal. Steele Sizer, MD  Active   donepezil (ARICEPT) 10 MG tablet [id number]  Take 1 tablet (10 mg total) by mouth at bedtime.  Patient taking differently: Take 10 mg by mouth daily. In the morning   Steele Sizer, MD  Active   empagliflozin (JARDIANCE) 25 MG TABS tablet [id number]  Take 1 tablet (25 mg total) by mouth daily. Steele Sizer, MD  Active   GLUCOSE BLOOD VI [id number] No   Patient not taking: Reported on 08/15/2021   [provider] Not Taking Active            Med Note Alleen Borne   Tue Aug 15, 2021  3:42 PM)    icosapent Ethyl (VASCEPA) 1 g capsule [id number]  Take 1 capsule (1 g total) by mouth 2 (two) times daily. Steele Sizer, MD  Active   losartan (COZAAR) 25 MG tablet [id number]  Take 1 tablet (25  mg total) by mouth daily. Steele Sizer, MD  Active   Melatonin 5 MG CAPS [id number]  Take 5 mg by mouth at bedtime.  [provider]  Active   memantine (NAMENDA) 10 MG tablet [id number]  Take 10 mg by mouth 2 (two) times daily. [provider]  Active   MULTIPLE VITAMIN PO [id number]  Take 1 tablet by mouth daily. [provider]  Active            Med Note Lavone Neri, [place]   Tue May 02, 2021  3:19 PM)    QUEtiapine (SEROQUEL) 100 MG tablet [id number]  Take 1 tablet by mouth in the morning, at noon, and at bedtime. Taking 168m am, afternoon, and pm PAnabel Bene MD  Active   rosuvastatin (CRESTOR) 20 MG tablet [id number] Take 1 tablet (20 mg total) by mouth daily. SSteele Sizer MD  Active   Patient not taking:  Discontinued 11/10/21 1503     Discontinued 11/10/21 1503   sertraline (ZOLOFT) 50 MG tablet [id number] Take 50 mg by mouth every evening. [provider]  Active   SitaGLIPtin-MetFORMIN HCl (JANUMET XR) 50-1000 MG TB24 [id number] Take 2 tablets by mouth daily. SSteele Sizer MD  Active   VITAMIN D PO [id number] Take 1,000 mg by mouth daily.  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 ([place] 01/09/2016   Mild dementia 11/01/2015   Auditory hallucination 07/26/2015   Anemia of chronic disease 07/05/2015   Cataract 04/06/2015   Type 2 diabetes mellitus with diabetic cataract ([place] 04/06/2015   Benign essential HTN 04/02/2015   Controlled gout 04/02/2015   Dyslipidemia 04/02/2015   Depression, major, recurrent,  mild ([place]) 04/02/2015   Fibromyalgia syndrome 04/02/2015   Chronic insomnia 04/02/2015   Cognitive decline 04/02/2015   Nodular basal cell carcinoma 04/02/2015   Morbid obesity ([place]) 04/02/2015   Osteopenia 04/02/2015   Calcium blood increased 04/02/2015     Immunization History  Administered Date(s) Administered   Fluad Quad(high Dose 65+) 07/09/2019, 11/10/2021   Influenza Split 03/06/2007   Influenza, High Dose Seasonal PF 07/01/2015, 08/10/2015, 08/06/2016, 08/02/2017, 07/04/2018, 07/04/2020   Influenza-Unspecified 11/19/2014   PFIZER Comirnaty(Gray Top)Covid-19 Tri-Sucrose Vaccine 12/11/2019, 01/01/2020   PFIZER(Purple Top)SARS-COV-2 Vaccination 12/11/2019, 01/01/2020, 08/22/2020   Pneumococcal Conjugate-13 04/06/2016   Pneumococcal Polysaccharide-23 09/03/2006, 11/19/2014   Tdap 11/19/2014   Zoster, Live 11/19/2014    Conditions to be addressed/monitored:  Hypertension, Hyperlipidemia, Diabetes, Depression, Osteopenia, Gout, and Dementia  There are no care plans that you recently modified to display for this patient.     Medication Assistance: None required.  Patient affirms current coverage meets needs.  Compliance/Adherence/Medication fill history: Care Gaps: Zoster Vaccines- Shingrix COVID-19 Vaccine Dose 3  Star-Rating Drugs: Jardiance 25 mg last filled on 03/18/2021 for a 90-Day supply with [place] Losartan 25 mg last filled on 03/13/2021 for a 90-Day supply with [place] Rosuvastatin 20 mg last filled on 05/09/2021 for a 90-Day supply with [place] XR 50-1000 mg last filled on 02/25/2021 for a 90-Day supply with [facility]  Patient's preferred pharmacy is:  [facility] [address], [place] [address] Phone: [phone] Fax: [phone]   Uses pill box? Yes Pt endorses 100% compliance  We discussed: Current pharmacy is preferred with insurance plan and patient is satisfied with pharmacy services Patient decided to: Continue current medication management strategy  Care Plan and Follow Up Patient Decision:  Patient agrees to Care Plan and Follow-up.  Plan: Telephone follow up appointment with care management team  member scheduled for:  12/13/2021 at 3:00 PM  Malva Limes, [facility] [phone]  Current Barriers:  No barriers noted  Pharmacist Clinical Goal(s):  Patient will maintain control of diabetes as evidenced by A1c less than 8%  through collaboration with PharmD and provider.   Interventions: 1:1 collaboration with Steele Sizer, MD regarding development and update of comprehensive plan of care as evidenced by provider attestation and co-signature Inter-disciplinary care team collaboration (see longitudinal plan of care) Comprehensive medication review performed; medication list updated in electronic medical record  Hypertension (BP goal <140/90) -Controlled -Current treatment: Carvedilol 12.5 mg twice daily  Losartan 25 mg daily -Medications previously tried: NA  -Current home readings: NA -Denies hypotensive/hypertensive symptoms -Recommended to continue current medication  Hyperlipidemia: (LDL goal < 70) -Controlled -Current treatment: Vascepa 1g twice daily  Rosuvastatin 20 mg daily  -Medications previously tried: NA  -Recommended to continue current medication  Diabetes (A1c goal <8%) -Controlled -Current medications: Jardiance 25 mg daily  Janumet XR 50-1000 2 tablets daily  -Medications previously tried: NA  -Current home glucose readings fasting glucose: NA -Denies hypoglycemic/hyperglycemic symptoms -Recommended to continue current medication  Osteoporosis / Osteopenia (Goal Prevent bone fractures) -Controlled -Last DEXA Scan: 11/25/18   T-Score femoral neck: -1.9  T-Score total hip: -1.5  T-Score lumbar spine: -1.8  T-Score forearm radius: NA  10-year probability of major osteoporotic fracture: NA  10-year probability of hip fracture: NA -Current treatment  Alendronate 70 mg weekly (started Jan 2018)  -Medications previously tried: NA  -Counseled on oral bisphosphonate administration: take in the  morning,  30 minutes prior to food with 6-8 oz of water. Do not lie down for at least 30 minutes after taking. -Recommended to continue current medication  Gout (Goal: Prevent gout flares) -Controlled -Current treatment  Allopurinol 100 mg twice daily  -Medications previously tried: NA  -Recommended to continue current medication  Dementia (Goal: prevent disease progression) -Controlled -Current treatment  Donepzil 10 mg daily  Memantine 10 mg twice daily  -Medications previously tried: NA -Patient continues to experience mild cognitive decline, focusing on stimulating exercises/puzzles.  -Patient has caregivers in AM and PM when daughter is unavailable.   -Recommended to continue current medication  Chronic Kidney Disease Stage 3a  -All medications assessed for renal dosing and appropriateness in chronic kidney disease. -{CCMPHARMDINTERVENTION:25122}   Patient Goals/Self-Care Activities Patient will:  - check glucose daily before breakfast, document, and provide at future appointments check blood pressure weekly, document, and provide at future appointments  Follow Up Plan: ***

## 2021-12-20 ENCOUNTER — Encounter: Payer: Self-pay | Admitting: Family Medicine

## 2021-12-25 ENCOUNTER — Telehealth: Payer: Self-pay

## 2021-12-25 NOTE — Telephone Encounter (Signed)
Form completed, faxed and copies made for Patient's daughter to pick up.  ?

## 2021-12-25 NOTE — Telephone Encounter (Signed)
Copied from [place] [phone]. Topic: General - Other ?>> Dec 25, 2021  3:44 PM Loma Boston wrote: ?Reason for CRM: Pls touch base with daughter re the forms discussing in MyChart, she was just called and states that the diagnois must be on from, not attach, then refax ?

## 2021-12-25 NOTE — Telephone Encounter (Signed)
In progress

## 2021-12-25 NOTE — Telephone Encounter (Signed)
All diagnoses are on forms, will refax and copies are still up front for pick up  ?

## 2022-01-02 ENCOUNTER — Telehealth: Payer: Self-pay

## 2022-01-02 NOTE — Progress Notes (Signed)
Chronic Care Management  APPOINTMENT REMINDER ? ?Lindsey [name] was reminded to have all medications, supplements and any blood glucose and blood pressure readings available for review with Lindsey [name], Pharm. D, at her telephone visit on 01/03/2022 at 2:45 pm. ? ?Patient daughter confirm appointment.  ? ?Anderson Malta ?Clinical Pharmacist Assistant ?[phone]  ? ?

## 2022-01-03 ENCOUNTER — Ambulatory Visit (INDEPENDENT_AMBULATORY_CARE_PROVIDER_SITE_OTHER): Payer: Medicare Other

## 2022-01-03 DIAGNOSIS — I10 Essential (primary) hypertension: Secondary | ICD-10-CM

## 2022-01-03 DIAGNOSIS — M8589 Other specified disorders of bone density and structure, multiple sites: Secondary | ICD-10-CM

## 2022-01-03 NOTE — Progress Notes (Signed)
? ?Chronic Care Management ?Pharmacy Note ? ?01/03/2022 ?Name:  Lindsey [name] MRN:  [id number] DOB:  July 24, 1951 ? ?Summary: ?Patient presents for CCM follow-up. Today's visit was 100% conducted with Lindsey [name], the patient's daughter.  ? ?-Patient has taken alendronate for 5+ years for her osteopenia.  ? ?Recommendations/Changes made from today's visit: ?-Recommend rechecking DEXA, can potentially stop Alendronate for drug holiday depending on results of imaging. ? ?Plan: ?CPP follow-up in 6 months  ? ?Recommended Problem List Changes:  ?Add: Chronic Kidney Disease stage 3a  ? ?Subjective: ?Lindsey [name] is an 72 y.o. year old female who is a primary patient of Sowles, Drue Stager, MD.  The CCM team was consulted for assistance with disease management and care coordination needs.   ? ?Engaged with patient by telephone for follow up visit in response to provider referral for pharmacy case management and/or care coordination services.  ? ?Consent to Services:  ?The patient was given information about Chronic Care Management services, agreed to services, and gave verbal consent prior to initiation of services.  Please see initial visit note for detailed documentation.  ? ?Patient Care Team: ?Steele Sizer, MD as PCP - General (Family Medicine) ?Mendel Corning, MD as Consulting Physician (Psychiatry) ?Anabel Bene, MD as Consulting Physician (Neurology) ?Germaine Pomfret, [facility] as Pharmacist (Pharmacist) ? ?Recent office visits: ?11/10/21: Patient presented to Dr. Ancil Boozer for follow-up.  ?08/15/22: Patient presented to Clemetine Marker, LPN for AWV.  ?Recent consult visits: ?12/06/21: Patient presents to Dr. Donalynn Furlong (Geriatrics) for follow-up.  ?09/06/22: Patient presents to Dr. Donalynn Furlong (Geriatrics) for follow-up.  ? ?Hospital visits: ?None in previous 6 months ? ? ?Objective: ? ?Lab Results  ?Component Value Date  ? CREATININE 1.21 (H) 11/10/2021  ? BUN 12 11/10/2021  ? GFRNONAA 57 (L) 08/29/2020  ? GFRAA 66 08/29/2020  ? NA  142 11/10/2021  ? K 3.7 11/10/2021  ? CALCIUM 10.3 11/10/2021  ? CO2 28 11/10/2021  ? GLUCOSE 138 (H) 11/10/2021  ? ? ?Lab Results  ?Component Value Date/Time  ? HGBA1C 7.1 (A) 11/10/2021 02:57 PM  ? HGBA1C 6.5 (A) 05/02/2021 03:30 PM  ? HGBA1C 6.9 (H) 10/30/2019 09:24 AM  ? HGBA1C 7.4 (A) 04/22/2019 04:03 PM  ? HGBA1C 6.7 (H) 10/14/2018 10:01 AM  ? HGBA1C 6.9 07/04/2018 03:59 PM  ? MICROALBUR 0.9 11/14/2021 04:17 PM  ? MICROALBUR 0.3 05/02/2021 04:00 PM  ? MICROALBUR 20 08/02/2017 02:50 PM  ? [place] NEGATIVE 04/06/2016 08:24 AM  ?  ?Last diabetic Eye exam:  ?Lab Results  ?Component Value Date/Time  ? HMDIABEYEEXA No Retinopathy 02/06/2021 12:00 AM  ?  ?Last diabetic Foot exam: No results found for: HMDIABFOOTEX  ? ?Lab Results  ?Component Value Date  ? CHOL 100 11/10/2021  ? HDL 41 (L) 11/10/2021  ? [place] 35 11/10/2021  ? TRIG 159 (H) 11/10/2021  ? CHOLHDL 2.4 11/10/2021  ? ? ? ?  Latest Ref Rng & Units 11/10/2021  ?  3:42 PM 08/29/2020  ?  3:43 PM 02/26/2020  ?  4:30 PM  ?Hepatic Function  ?Total Protein 6.1 - 8.1 g/dL 6.0   7.0   6.2    ?AST 10 - 35 U/L 36   20   17    ?ALT 6 - 29 U/L '28   17   11    '$ ?Total Bilirubin 0.2 - 1.2 mg/dL 0.5   0.6   0.5    ? ? ?Lab Results  ?Component Value Date/Time  ? TSH 3.83 11/10/2021  03:42 PM  ? TSH 3.67 08/29/2020 03:43 PM  ? ? ? ?  Latest Ref Rng & Units 11/10/2021  ?  3:42 PM 08/29/2020  ?  3:43 PM 02/26/2020  ?  4:30 PM  ?CBC  ?WBC 3.8 - 10.8 Thousand/uL 6.3   9.3   7.5    ?Hemoglobin 11.7 - 15.5 g/dL 11.7   13.1   11.0    ?Hematocrit 35.0 - 45.0 % 36.3   39.8   33.5    ?Platelets 140 - 400 Thousand/uL 161   253   253    ? ? ?Lab Results  ?Component Value Date/Time  ? VD25OH 60 11/10/2021 03:42 PM  ? VD25OH 58 08/29/2020 03:43 PM  ? ? ?Clinical ASCVD: No  ?The ASCVD Risk score (Arnett DK, et al., 2019) failed to calculate for the following reasons: ?  The valid total cholesterol range is 130 to 320 mg/dL   ? ? ?  11/10/2021  ?  2:50 PM 08/15/2021  ?  3:44 PM 05/02/2021  ?   3:19 PM  ?Depression screen PHQ 2/9  ?Decreased Interest 0 0 0  ?Down, Depressed, Hopeless 0 0 0  ?PHQ - 2 Score 0 0 0  ?Altered sleeping 0    ?Tired, decreased energy 0    ?Change in appetite 0    ?Feeling bad or failure about yourself  0    ?Trouble concentrating 0    ?Moving slowly or fidgety/restless 0    ?Suicidal thoughts 0    ?PHQ-9 Score 0    ?Difficult doing work/chores Not difficult at all    ?  ?Social History  ? ?Tobacco Use  ?Smoking Status Never  ?Smokeless Tobacco Never  ?Tobacco Comments  ? smoking cessation materials not required  ? ?BP Readings from Last 3 Encounters:  ?11/10/21 128/68  ?05/02/21 (!) 142/80  ?02/01/21 (!) 142/86  ? ?Pulse Readings from Last 3 Encounters:  ?11/10/21 97  ?05/02/21 86  ?02/01/21 99  ? ?Wt Readings from Last 3 Encounters:  ?11/10/21 137 lb 14.4 oz (62.6 kg)  ?05/02/21 152 lb (68.9 kg)  ?02/01/21 148 lb 6.4 oz (67.3 kg)  ? ?BMI Readings from Last 3 Encounters:  ?11/10/21 27.85 kg/m?  ?05/02/21 30.70 kg/m?  ?02/01/21 29.97 kg/m?  ? ? ?Assessment/Interventions: Review of patient past medical history, allergies, medications, health status, including review of consultants reports, laboratory and other test data, was performed as part of comprehensive evaluation and provision of chronic care management services.  ? ?SDOH:  (Social Determinants of Health) assessments and interventions performed: Yes ? ? ?SDOH Screenings  ? ?Alcohol Screen: Low Risk   ? Last Alcohol Screening Score (AUDIT): 0  ?Depression (PHQ2-9): Low Risk   ? PHQ-2 Score: 0  ?Financial Resource Strain: Low Risk   ? Difficulty of Paying Living Expenses: Not hard at all  ?Food Insecurity: No Food Insecurity  ? Worried About Charity fundraiser in the Last Year: Never true  ? Ran Out of Food in the Last Year: Never true  ?Housing: Low Risk   ? Last Housing Risk Score: 0  ?Physical Activity: Inactive  ? Days of Exercise per Week: 0 days  ? Minutes of Exercise per Session: 0 min  ?Social Connections: Moderately  Isolated  ? Frequency of Communication with Friends and Family: More than three times a week  ? Frequency of Social Gatherings with Friends and Family: More than three times a week  ? Attends Religious Services: More than 4 times  per year  ? Active Member of Clubs or Organizations: No  ? Attends Archivist Meetings: Never  ? Marital Status: Divorced  ?Stress: Stress Concern Present  ? Feeling of Stress : To some extent  ?Tobacco Use: Low Risk   ? Smoking Tobacco Use: Never  ? Smokeless Tobacco Use: Never  ? Passive Exposure: Not on file  ?Transportation Needs: No Transportation Needs  ? Lack of Transportation (Medical): No  ? Lack of Transportation (Non-Medical): No  ? ? ?CCM Care Plan ? ?Allergies  ?Allergen Reactions  ? Ace Inhibitors   ? ? ?Medications Reviewed Today   ? ? Reviewed by Steele Sizer, MD (Physician) on 11/10/21 at [place] List Status: <None>  ? ?Medication Order Taking? Sig Documenting Provider Last Dose Status Informant  ?alendronate (FOSAMAX) 70 MG tablet [id number]  TAKE WITH A FULL GLASS OF WATER ON AN EMPTY STOMACH Ancil Boozer, Drue Stager, MD  Active   ?allopurinol (ZYLOPRIM) 100 MG tablet [id number]  Take 1 tablet (100 mg total) by mouth 2 (two) times daily. Steele Sizer, MD  Active   ?carvedilol (COREG) 12.5 MG tablet [id number]  Take 1 tablet (12.5 mg total) by mouth 2 (two) times daily with a meal. Steele Sizer, MD  Active   ?donepezil (ARICEPT) 10 MG tablet [id number]  Take 1 tablet (10 mg total) by mouth at bedtime.  ?Patient taking differently: Take 10 mg by mouth daily. In the morning  ? Steele Sizer, MD  Active   ?empagliflozin (JARDIANCE) 25 MG TABS tablet [id number]  Take 1 tablet (25 mg total) by mouth daily. Steele Sizer, MD  Active   ?GLUCOSE BLOOD VI [id number] No   ?Patient not taking: Reported on 08/15/2021  ? [provider] Not Taking Active   ?         ?Med Note Alleen Borne   Tue Aug 15, 2021  3:42 PM)    ?icosapent Ethyl (VASCEPA) 1 g capsule  [id number]  Take 1 capsule (1 g total) by mouth 2 (two) times daily. Steele Sizer, MD  Active   ?losartan (COZAAR) 25 MG tablet [id number]  Take 1 tablet (25 mg total) by mouth daily. Steele Sizer, MD  Activ

## 2022-01-09 NOTE — Patient Instructions (Signed)
Visit Information ?It was great speaking with you today!  Please let me know if you have any questions about our visit. ? ? Goals Addressed   ? ?  ?  ?  ?  ? This Visit's Progress  ?  Monitor and Manage My Blood Sugar-Diabetes Type 2   On track  ?  Timeframe:  Long-Range Goal ?Priority:  High ?Start Date: 05/12/2021                             ?Expected End Date: 05/12/2022                      ? ?Follow Up within 90 days ?  ?- check blood sugar at prescribed times ?- check blood sugar if I feel it is too high or too low ?- enter blood sugar readings and medication or insulin into daily log  ?  ?Why is this important?   ?Checking your blood sugar at home helps to keep it from getting very high or very low.  ?Writing the results in a diary or log helps the doctor know how to care for you.  ?Your blood sugar log should have the time, date and the results.  ?Also, write down the amount of insulin or other medicine that you take.  ?Other information, like what you ate, exercise done and how you were feeling, will also be helpful.   ?  ?Notes:  ?  ? ?  ? ? ?Patient Care Plan: General Pharmacy (Adult)  ?  ? ?Problem Identified: Hypertension, Hyperlipidemia, Diabetes, Depression, Osteopenia, Gout, and Dementia   ?Priority: High  ?  ? ?Long-Range Goal: Patient-Specific Goal   ?Start Date: 05/12/2021  ?Expected End Date: 05/12/2022  ?This Visit's Progress: On track  ?Recent Progress: On track  ?Priority: High  ?Note:   ?Current Barriers:  ?No barriers noted ? ?Pharmacist Clinical Goal(s):  ?Patient will maintain control of diabetes as evidenced by A1c less than 8%  through collaboration with PharmD and provider.  ? ?Interventions: ?1:1 collaboration with Steele Sizer, MD regarding development and update of comprehensive plan of care as evidenced by provider attestation and co-signature ?Inter-disciplinary care team collaboration (see longitudinal plan of care) ?Comprehensive medication review performed; medication list  updated in electronic medical record ? ?Hypertension (BP goal <140/90) ?-Controlled ?-Current treatment: ?Carvedilol 12.5 mg twice daily: Appropriate, Effective, Safe, Accessible ?Losartan 25 mg daily: Appropriate, Effective, Safe, Accessible ?-Medications previously tried: NA  ?-Current home readings: NA ?-Denies hypotensive/hypertensive symptoms ?-Recommended to continue current medication ? ?Hyperlipidemia: (LDL goal < 70) ?-Controlled ?-Current treatment: ?Vascepa 1g twice daily  ?Rosuvastatin 20 mg daily  ?-Medications previously tried: NA  ?-Recommended to continue current medication ? ?Diabetes (A1c goal <8%) ?-Controlled ?-Current medications: ?Jardiance 25 mg daily  ?Janumet XR 50-1000 2 tablets daily  ?-Medications previously tried: NA  ?-Current home glucose readings ?fasting glucose: Unable to self-monitor.  ?-Denies hypoglycemic/hyperglycemic symptoms ?-Recommended to continue current medication ? ?Osteopenia (Goal Prevent bone fractures) ?-Controlled ?-Last DEXA Scan: 11/25/18  ? T-Score femoral neck: -1.9 ? T-Score total hip: -1.5 ? T-Score lumbar spine: -1.8 ? T-Score forearm radius: NA ? 10-year probability of major osteoporotic fracture: NA ? 10-year probability of hip fracture: NA ?-Current treatment  ?Alendronate 70 mg weekly: Query Appropriate ?Started Jan 2018 ?-Medications previously tried: NA  ?-Recommend rechecking DEXA, can potentially stop Alendronate for drug holiday depending on results of imaging.  ? ?Gout (Goal: Prevent  gout flares) ?-Controlled ?-Current treatment  ?Allopurinol 100 mg twice daily  ?-Medications previously tried: NA  ?-Recommended to continue current medication ? ?Dementia (Goal: prevent disease progression) ?-Controlled ?-Current treatment  ?Donepzil 10 mg daily  ?Memantine 10 mg twice daily  ?-Medications previously tried: NA ?-Patient continues to experience mild cognitive decline, focusing on stimulating exercises/puzzles.  ?-Patient has caregivers in AM and PM when  daughter is unavailable.   ?-Recommended to continue current medication ? ?Chronic Kidney Disease Stage 3a  ?-All medications assessed for renal dosing and appropriateness in chronic kidney disease. ?-Recommended to continue current medication ? ? ?Patient Goals/Self-Care Activities ?Patient will:  ?- check glucose daily before breakfast, document, and provide at future appointments ?check blood pressure weekly, document, and provide at future appointments ? ?Follow Up Plan: Telephone follow up appointment with care management team member scheduled for:  07/04/2022 at 3:00 PM ?  ? ?Patient agreed to services and verbal consent obtained.  ? ?Patient verbalizes understanding of instructions and care plan provided today and agrees to view in [place]. Active MyChart status confirmed with patient.   ? ?Doristine Section, BCACP, CPP ?Clinical Pharmacist Practitioner  ?[facility] ?[phone]  ?

## 2022-01-12 DIAGNOSIS — I10 Essential (primary) hypertension: Secondary | ICD-10-CM

## 2022-02-05 NOTE — Progress Notes (Signed)
Name: Lindsey [name]   MRN: [id number]    DOB: December 29, 1950   Date:02/06/2022 ? ?     Progress Note ? ?Subjective ? ?Chief Complaint ? ?Follow Up ? ?HPI ? ?DMII:   HgbA1C has been out of control 8.1%, 8.2% 8.2% , 6.9%, 6.7% , 7.4% , down to 7 % ,6.8 % 6.5 % last  7.1 %  She has an aid coming to her house daily for breakfast , she also has an aid during dinner time and recently due to weight loss a family member has been going to the house to make sure she eats lunch. t  She denies polyphagia, polydipsia or polyuria. No recent episodes of hypoglycemia  She has obesity , CKI , dyslipidemia.she is on statin therapy and ARB.   ? ?Breast cancer screen/malnutrition: discussed with daughter and patient, due to her dementia and inability to make decisions on her own it would not be wise to let her go through cancer treatment we will not due any cancer screen. . She had lost almost 10 % in 6 months prior to her last visit, now caregivers have to watch her eat all three meals and her weight is up from 138 lbs to 145 lbs in the past 3 months  ?  ?Osteopenia: she was on Evista, but switched to Fosamax because of insurance changes. She is doing well, repeat bone density was done 11/2018 and stable. ? ?Auditory Hallucination/Dementia: seen by Dr. Melrose Nakayama and also  by  Dr. Donalynn Furlong - geriatrician. She is still taking Seroquel  100 mg TID  and 10 mg of  Aricept, and Namenda twice daily and zoloft 50 mg daily  ?  ?HTN:  denies dizziness.  No  chest pain but has some sob with some activity , she denies palpitation  She is compliant with medication - daughter dispenses it . SBP today is at goal.  ?  ?Hyperlipidemia: taking Crestor and denies side effects of medication, including no muscle ache LDL at goal at 35 ( 01/23) ?  ?Major Depression in remission: daughter does not think she is depressed but is anxious, gets frustrated for not remembering things at times. Taking Zoloft and no side effects  ?  ?She still lives alone, daughter is  managing her bills, doctors visits, and she also dispenses her medications, she has an  Aid that spends the mornings with her, family members check on her during the evening but also has someone supervising her when she eats . She forgets to eat and had lost a lot of weight  but since family member is going to her house at lunch the weight is trending up. She needs assistance bathing, getting dressed, taking medications, no longer cooks or cleans. She is unable to use microwave or even open pre package meals by herself , she needs 24 hour care and family is trying to get it approved by Medicaid so she does not have to go to a facility  ? ?Patient Active Problem List  ? Diagnosis Date Noted  ? Pain due to onychomycosis of toenails of both feet 05/04/2019  ? 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 Mild dementia ([place]) 11/01/2015  ? Auditory hallucination 07/26/2015  ? Anemia of chronic disease 07/05/2015  ? Cataract 04/06/2015  ? Type 2 diabetes mellitus with diabetic cataract ([place]) 04/06/2015  ? Benign essential HTN 04/02/2015  ? Controlled gout 04/02/2015  ? Dyslipidemia 04/02/2015  ? Depression, major, recurrent, mild ([place]) 04/02/2015  ?  Fibromyalgia syndrome 04/02/2015  ? Chronic insomnia 04/02/2015  ? Cognitive decline 04/02/2015  ? Nodular basal cell carcinoma 04/02/2015  ? Morbid obesity ([place]) 04/02/2015  ? Osteopenia 04/02/2015  ? Calcium blood increased 04/02/2015  ? ? ?Past Surgical History:  ?Procedure Laterality Date  ? CESAREAN SECTION    ? TONSILECTOMY, ADENOIDECTOMY, BILATERAL MYRINGOTOMY AND TUBES    ? TUBAL LIGATION    ? ? ?Family History  ?Problem Relation Age of Onset  ? Hypertension Mother   ? Osteoporosis Mother   ? Cancer Mother   ?     brain tumor  ? Hodgkin's lymphoma Father   ? Hypertension Sister   ? Cancer Sister   ?     unknown  ? Hypertension Sister   ? Diabetes Sister   ? Cancer Sister 16  ?     breast  ? Breast cancer Sister 89  ? Cancer Maternal Uncle    ? ? ?Social History  ? ?Tobacco Use  ? Smoking status: Never  ? Smokeless tobacco: Never  ? Tobacco comments:  ?  smoking cessation materials not required  ?Substance Use Topics  ? Alcohol use: No  ?  Alcohol/week: 0.0 standard drinks  ? ? ? ?Current Outpatient Medications:  ?  alendronate (FOSAMAX) 70 MG tablet, Take with a full glass of water on an empty stomach., Disp: 12 tablet, Rfl: 3 ?  allopurinol (ZYLOPRIM) 100 MG tablet, Take 1 tablet (100 mg total) by mouth 2 (two) times daily., Disp: 180 tablet, Rfl: 1 ?  carvedilol (COREG) 12.5 MG tablet, Take 1 tablet (12.5 mg total) by mouth 2 (two) times daily with a meal., Disp: 180 tablet, Rfl: 1 ?  donepezil (ARICEPT) 10 MG tablet, Take 1 tablet (10 mg total) by mouth at bedtime. (Patient taking differently: Take 10 mg by mouth daily. In the morning), Disp: 90 tablet, Rfl: 1 ?  empagliflozin (JARDIANCE) 25 MG TABS tablet, Take 1 tablet (25 mg total) by mouth daily., Disp: 90 tablet, Rfl: 1 ?  GLUCOSE BLOOD VI, , Disp: , Rfl:  ?  icosapent Ethyl (VASCEPA) 1 g capsule, Take 1 capsule (1 g total) by mouth 2 (two) times daily., Disp: 180 capsule, Rfl: 1 ?  losartan (COZAAR) 25 MG tablet, Take 1 tablet (25 mg total) by mouth daily., Disp: 90 tablet, Rfl: 1 ?  Melatonin 5 MG CAPS, Take 5 mg by mouth at bedtime. , Disp: , Rfl:  ?  memantine (NAMENDA) 10 MG tablet, Take 10 mg by mouth 2 (two) times daily., Disp: , Rfl:  ?  MULTIPLE VITAMIN PO, Take 1 tablet by mouth daily., Disp: , Rfl:  ?  QUEtiapine (SEROQUEL) 100 MG tablet, Take 1 tablet by mouth in the morning, at noon, and at bedtime. Taking '100mg'$  am, afternoon, and pm, Disp: , Rfl:  ?  rosuvastatin (CRESTOR) 20 MG tablet, Take 1 tablet (20 mg total) by mouth daily., Disp: 90 tablet, Rfl: 1 ?  sertraline (ZOLOFT) 50 MG tablet, Take 50 mg by mouth every evening., Disp: , Rfl:  ?  SitaGLIPtin-MetFORMIN HCl (JANUMET XR) 50-1000 MG TB24, Take 2 tablets by mouth daily., Disp: 180 tablet, Rfl: 1 ?  VITAMIN D PO, Take 1,000  mg by mouth daily. , Disp: , Rfl:  ? ?Allergies  ?Allergen Reactions  ? Ace Inhibitors   ? ? ?I personally reviewed active problem list, medication list, allergies, family history, social history, health maintenance with the patient/caregiver today. ? ? ?ROS ? ?Ten systems reviewed and  is negative except as mentioned in HPI  ? ?Objective ? ?Vitals:  ? 02/06/22 1448  ?BP: 136/72  ?Pulse: 90  ?Resp: 16  ?SpO2: 98%  ?Weight: 145 lb (65.8 kg)  ?Height: '4\' 11"'$  (1.499 m)  ? ? ?Body mass index is 29.29 kg/m?. ? ?Physical Exam ? ?Constitutional: Patient appears well-developed and malnourished ( temporal waisting)  No distress.  ?HEENT: head atraumatic, normocephalic, pupils equal and reactive to light, neck supple ?Cardiovascular: Normal rate, regular rhythm and normal heart sounds.  No murmur heard. No BLE edema. ?Pulmonary/Chest: Effort normal and breath sounds normal. No respiratory distress. ?Abdominal: Soft.  There is no tenderness. ?Psychiatric: calm today, cooperative, unable to give me a history  ? ?Recent Results (from the past 2160 hour(s))  ?POCT HgB A1C     Status: Abnormal  ? Collection Time: 11/10/21  2:57 PM  ?Result Value Ref Range  ? Hemoglobin A1C 7.1 (A) 4.0 - 5.6 %  ? HbA1c POC (<> result, manual entry)    ? HbA1c, POC (prediabetic range)    ? HbA1c, POC (controlled diabetic range)    ?Lipid panel     Status: Abnormal  ? Collection Time: 11/10/21  3:42 PM  ?Result Value Ref Range  ? Cholesterol 100 <200 mg/dL  ? HDL 41 (L) > OR = 50 mg/dL  ? Triglycerides 159 (H) <150 mg/dL  ? LDL Cholesterol (Calc) 35 mg/dL (calc)  ?  Comment: Reference range: <100 ?Marland Kitchen ?Desirable range <100 mg/dL for primary prevention;   ?<70 mg/dL for patients with CHD or diabetic patients  ?with > or = 2 CHD risk factors. ?. ?LDL-C is now calculated using the Martin-Hopkins  ?calculation, which is a validated novel method providing  ?better accuracy than the Friedewald equation in the  ?estimation of LDL-C.  ?Cresenciano Genre et al. Annamaria Helling.  8850;277(41): 2061-2068  ?(http://education.[employer].com/faq/FAQ164) ?  ? Total CHOL/HDL Ratio 2.4 <5.0 (calc)  ? Non-HDL Cholesterol (Calc) 59 <130 mg/dL (calc)  ?  Comment: For patients with diabetes plu

## 2022-02-06 ENCOUNTER — Ambulatory Visit (INDEPENDENT_AMBULATORY_CARE_PROVIDER_SITE_OTHER): Payer: Medicare Other | Admitting: Family Medicine

## 2022-02-06 ENCOUNTER — Encounter: Payer: Self-pay | Admitting: Family Medicine

## 2022-02-06 VITALS — BP 136/72 | HR 90 | Resp 16 | Ht 59.0 in | Wt 145.0 lb

## 2022-02-06 DIAGNOSIS — E1129 Type 2 diabetes mellitus with other diabetic kidney complication: Secondary | ICD-10-CM

## 2022-02-06 DIAGNOSIS — F03918 Unspecified dementia, unspecified severity, with other behavioral disturbance: Secondary | ICD-10-CM

## 2022-02-06 DIAGNOSIS — R809 Proteinuria, unspecified: Secondary | ICD-10-CM

## 2022-02-06 DIAGNOSIS — E441 Mild protein-calorie malnutrition: Secondary | ICD-10-CM | POA: Diagnosis not present

## 2022-02-06 DIAGNOSIS — N1831 Chronic kidney disease, stage 3a: Secondary | ICD-10-CM | POA: Diagnosis not present

## 2022-02-06 DIAGNOSIS — M8589 Other specified disorders of bone density and structure, multiple sites: Secondary | ICD-10-CM

## 2022-02-06 DIAGNOSIS — I10 Essential (primary) hypertension: Secondary | ICD-10-CM | POA: Diagnosis not present

## 2022-02-07 LAB — HM DIABETES EYE EXAM

## 2022-02-10 ENCOUNTER — Encounter: Payer: Self-pay | Admitting: Family Medicine

## 2022-03-20 ENCOUNTER — Telehealth: Payer: Self-pay

## 2022-03-20 NOTE — Progress Notes (Signed)
Chronic Care Management Pharmacy Assistant   Name: Lindsey [name]  MRN: [id number] DOB: September 15, 1951  Reason for Encounter: Hypertension Disease State Call.  Recent office visits:  02/06/2022 Dr. Ancil Boozer MD (PCP) No medication Changes noted  Recent consult visits:  01/10/2022 Luella Cook PA (Neurology) No medication Changes noted, referral for at-home physical therapy, Follow up 6 months  Hospital visits:  None in previous 6 months  Medications: Outpatient Encounter Medications as of 03/20/2022  Medication Sig   alendronate (FOSAMAX) 70 MG tablet Take with a full glass of water on an empty stomach.   allopurinol (ZYLOPRIM) 100 MG tablet Take 1 tablet (100 mg total) by mouth 2 (two) times daily.   carvedilol (COREG) 12.5 MG tablet Take 1 tablet (12.5 mg total) by mouth 2 (two) times daily with a meal.   donepezil (ARICEPT) 10 MG tablet Take 1 tablet (10 mg total) by mouth at bedtime. (Patient taking differently: Take 10 mg by mouth daily. In the morning)   empagliflozin (JARDIANCE) 25 MG TABS tablet Take 1 tablet (25 mg total) by mouth daily.   GLUCOSE BLOOD VI    icosapent Ethyl (VASCEPA) 1 g capsule Take 1 capsule (1 g total) by mouth 2 (two) times daily.   losartan (COZAAR) 25 MG tablet Take 1 tablet (25 mg total) by mouth daily.   Melatonin 5 MG CAPS Take 5 mg by mouth at bedtime.    memantine (NAMENDA) 10 MG tablet Take 10 mg by mouth 2 (two) times daily.   MULTIPLE VITAMIN PO Take 1 tablet by mouth daily.   QUEtiapine (SEROQUEL) 100 MG tablet Take 1 tablet by mouth in the morning, at noon, and at bedtime. Taking '100mg'$  am, afternoon, and pm   rosuvastatin (CRESTOR) 20 MG tablet Take 1 tablet (20 mg total) by mouth daily.   sertraline (ZOLOFT) 50 MG tablet Take 50 mg by mouth every evening.   SitaGLIPtin-MetFORMIN HCl (JANUMET XR) 50-1000 MG TB24 Take 2 tablets by mouth daily.   VITAMIN D PO Take 1,000 mg by mouth daily.    No facility-administered encounter medications on file  as of 03/20/2022.    Care Gaps: Zoster Vaccines- Shingrix COVID-19 Vaccine Dose 3 Ophthalmology Exam  Star Rating Drug: Jardiance 25 mg last filled on 02/07/2022 for a 90-Day supply with [place] Losartan 25 mg last filled on 02/07/2022 for a 90-Day supply with [place] Rosuvastatin 20 mg last filled on 02/07/2022 for a 90-Day supply with [place] XR 50-1000 mg last filled on 02/08/2022 for a 90-Day supply with [place]  Medication Fill Gaps: None ID  Reviewed chart prior to disease state call. Spoke with patient regarding BP  Recent Office Vitals: BP Readings from Last 3 Encounters:  02/06/22 136/72  11/10/21 128/68  05/02/21 (!) 142/80   Pulse Readings from Last 3 Encounters:  02/06/22 90  11/10/21 97  05/02/21 86    Wt Readings from Last 3 Encounters:  02/06/22 145 lb (65.8 kg)  11/10/21 137 lb 14.4 oz (62.6 kg)  05/02/21 152 lb (68.9 kg)     Kidney Function Lab Results  Component Value Date/Time   CREATININE 1.21 (H) 11/10/2021 03:42 PM   CREATININE 1.00 (H) 08/29/2020 03:43 PM   GFRNONAA 57 (L) 08/29/2020 03:43 PM   GFRAA 66 08/29/2020 03:43 PM       Latest Ref Rng & Units 11/10/2021    3:42 PM 08/29/2020    3:43 PM 02/26/2020    4:30 PM  BMP  Glucose 65 - 99 mg/dL 138   149   121    BUN 7 - 25 mg/dL '12   11   23    '$ Creatinine 0.60 - 1.00 mg/dL 1.21   1.00   1.02    BUN/Creat Ratio 6 - 22 (calc) '10   11   23    '$ Sodium 135 - 146 mmol/L 142   142   141    Potassium 3.5 - 5.3 mmol/L 3.7   3.7   4.2    Chloride 98 - 110 mmol/L 105   104   102    CO2 20 - 32 mmol/L '28   30   29    '$ Calcium 8.6 - 10.4 mg/dL 10.3   10.7   10.0      Current antihypertensive regimen:  Carvedilol 12.5 mg twice daily Losartan 25 mg daily  How often are you checking your Blood Pressure? infrequently  Current home BP readings:  Patient daughter states she is unsure what her blood pressure is as she does not keep a log.Patient daughter  reports patient blood pressure is usually "good/normal". Patient daughter states patient denies headaches,lightheadedness,or dizziness.    What recent interventions/DTPs have been made by any provider to improve Blood Pressure control since last CPP Visit: None ID  Any recent hospitalizations or ED visits since last visit with CPP? No  What diet changes have been made to improve Blood Pressure Control?  None ID   What exercise is being done to improve your Blood Pressure Control?  Patient daughter states patient walks through the house through out the day, and may go to [place] where she will walk around.   Adherence Review: Is the patient currently on ACE/ARB medication? Yes Does the patient have >5 day gap between last estimated fill dates? No  Telephone follow up appointment with care management team member scheduled for:  07/04/2022 at 3:00 PM  [place] Pharmacist Assistant [phone]

## 2022-04-30 ENCOUNTER — Other Ambulatory Visit: Payer: Self-pay | Admitting: Family Medicine

## 2022-04-30 DIAGNOSIS — E785 Hyperlipidemia, unspecified: Secondary | ICD-10-CM

## 2022-05-07 NOTE — Progress Notes (Unsigned)
Name: Lindsey [name]   MRN: [id number]    DOB: 29-Jan-1951   Date:05/08/2022       Progress Note  Subjective  Chief Complaint  Follow Up  HPI  DMII:   HgbA1C has been out of control 8.1%, 8.2% 8.2% , 6.9%, 6.7% , 7.4% , down to 7 % ,6.8 % 6.5 % , 7.1 % and today is 6.5 %   She has an aid coming to her house daily for breakfast , she also has an aid during dinner time and recently due to weight loss a family member has been going to the house to make sure she eats lunch.   She denies polyphagia, polydipsia or polyuria. No recent episodes of hypoglycemia  She has obesity , CKI stage III , and last urine micro was up from 8 to 31  dyslipidemia.she is on statin therapy and ARB.    Breast cancer screen/malnutrition: discussed with daughter and patient, due to her dementia and inability to make decisions on her own it would not be wise to let her go through cancer treatment we will not due any cancer screen. . She had lost almost 10 % in 6 months - over 2022 , now caregivers have to watch her eat all three meals and her weight has been stable in the 140 lbs range, she is also eating more snacks.    Osteopenia: she was on Evista, but switched to Fosamax because of insurance changes. She is doing well, last  bone density was done 11/2018 and stable.We will repeat it today   Auditory Hallucination/Dementia: seen by Dr. Melrose Nakayama and also  by  Dr. Donalynn Furlong - geriatrician. She is still taking Seroquel  100 mg TID  and 10 mg of  Aricept, and Namenda twice daily and zoloft as prescribed.    HTN:  denies dizziness.  No  chest pain but has some sob with some activity , she denies palpitation  She is compliant with medication - daughter dispenses it . SBP today is at goal. Not checking BP at home    Hyperlipidemia: taking Crestor and denies side effects of medication, including no muscle ache LDL at goal at 35 ( 01/23), continue medication    Major Depression in remission: daughter does not think she is depressed but  is anxious, gets frustrated for not remembering things at times but not as often as it used to be . Taking Zoloft and no side effects    She still lives alone, but currently staying with her daughter due to [facility] problems, daughter is managing her bills, doctors visits, and she also dispenses her medications, she has an  Aid that spends the mornings with her,family pays for an aid to go at night to feed and bath her but she is able to sleep on her own - she does not get out of bed and they have a camera that allows them to watch her .  She forgets to eat and had lost a lot of weight  but since family member is going to her house at lunch the weight is trending up. She needs assistance bathing, getting dressed, taking medications, no longer cooks or cleans. 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 ([facility]) 01/09/2016   Mild dementia ([place]) 11/01/2015   Auditory hallucination 07/26/2015   Anemia of chronic disease 07/05/2015   Cataract 04/06/2015   Type 2 diabetes mellitus with diabetic cataract ([place]) 04/06/2015   Benign essential HTN 04/02/2015   Controlled gout 04/02/2015   Dyslipidemia 04/02/2015   Depression, major, recurrent, mild ([place]) 04/02/2015   Fibromyalgia syndrome 04/02/2015   Chronic insomnia 04/02/2015   Cognitive decline 04/02/2015   Nodular basal cell carcinoma 04/02/2015   Morbid obesity ([place]) 04/02/2015   Osteopenia 04/02/2015   Calcium blood increased 04/02/2015    Past Surgical History:  Procedure Laterality Date   CESAREAN SECTION     TONSILECTOMY, ADENOIDECTOMY, BILATERAL MYRINGOTOMY AND TUBES     TUBAL LIGATION      Family History  Problem Relation Age of Onset    Hypertension Mother    Osteoporosis Mother    Cancer Mother        brain tumor   Hodgkin's lymphoma Father    Hypertension Sister    Cancer Sister        unknown   Hypertension Sister    Diabetes Sister    Cancer Sister 31       breast   Breast cancer Sister 7   Cancer Maternal Uncle     Social History   Tobacco Use   Smoking status: Never   Smokeless tobacco: Never   Tobacco comments:    smoking cessation materials not required  Substance Use Topics   Alcohol use: No    Alcohol/week: 0.0 standard drinks of alcohol     Current Outpatient Medications:    alendronate (FOSAMAX) 70 MG tablet, Take with a full glass of water on an empty stomach., Disp: 12 tablet, Rfl: 3   allopurinol (ZYLOPRIM) 100 MG tablet, Take 1 tablet (100 mg total) by mouth 2 (two) times daily., Disp: 180 tablet, Rfl: 1   carvedilol (COREG) 12.5 MG tablet, Take 1 tablet (12.5 mg total) by mouth 2 (two) times daily with a meal., Disp: 180 tablet, Rfl: 1   donepezil (ARICEPT) 10 MG tablet, Take 1 tablet (10 mg total) by mouth at bedtime. (Patient taking differently: Take 10 mg by mouth daily. In the morning), Disp: 90 tablet, Rfl: 1   empagliflozin (JARDIANCE) 25 MG TABS tablet, Take 1 tablet (25 mg total) by mouth daily., Disp: 90 tablet, Rfl: 1   GLUCOSE BLOOD VI, , Disp: , Rfl:    icosapent Ethyl (VASCEPA) 1 g capsule, Take 1 capsule (1 g total) by mouth 2 (two) times daily., Disp: 180 capsule, Rfl: 1   losartan (COZAAR) 25 MG tablet, Take 1 tablet (25 mg total) by mouth daily., Disp: 90 tablet, Rfl: 1   Melatonin 5 MG CAPS, Take 5 mg by mouth at bedtime. , Disp: , Rfl:    memantine (NAMENDA) 10 MG tablet, Take 10 mg by mouth 2 (two) times daily., Disp: , Rfl:    MULTIPLE VITAMIN PO, Take 1 tablet by mouth daily., Disp: , Rfl:    QUEtiapine (SEROQUEL) 100 MG tablet, Take 1 tablet by mouth in the morning, at noon, and at bedtime. Taking '100mg'$  am, afternoon, and pm, Disp: , Rfl:    rosuvastatin (CRESTOR) 20  MG tablet, Take 1 tablet by mouth once daily, Disp: 90 tablet, Rfl: 0   sertraline (ZOLOFT) 50 MG tablet, Take 50 mg by mouth every evening., Disp: , Rfl:    SitaGLIPtin-MetFORMIN HCl (JANUMET  XR) 50-1000 MG TB24, Take 2 tablets by mouth daily., Disp: 180 tablet, Rfl: 1   VITAMIN D PO, Take 1,000 mg by mouth daily. , Disp: , Rfl:   Allergies  Allergen Reactions   Ace Inhibitors     I personally reviewed active problem list, medication list, allergies, family history, social history, health maintenance with the patient/caregiver today.   ROS  Constitutional: Negative for fever or weight change.  Respiratory: Negative for cough and shortness of breath.   Cardiovascular: Negative for chest pain or palpitations.  Gastrointestinal: Negative for abdominal pain, no bowel changes.  Musculoskeletal: Negative for gait problem or joint swelling.  Skin: Negative for rash.  Neurological: Negative for dizziness or headache.  No other specific complaints in a complete review of systems (except as listed in HPI above).   Objective  Vitals:   05/08/22 1037  BP: 126/66  Pulse: 85  Resp: 18  Temp: 98 F (36.7 C)  TempSrc: Oral  SpO2: 97%  Weight: 143 lb 12.8 oz (65.2 kg)  Height: '4\' 11"'$  (1.499 m)    Body mass index is 29.04 kg/m.  Physical Exam  Constitutional: Patient appears well-developed and malnourished   No distress.  HEENT: head atraumatic, normocephalic, pupils equal and reactive to light,, neck supple, throat within normal limits Cardiovascular: Normal rate, regular rhythm and normal heart sounds.  No murmur heard. No BLE edema. Pulmonary/Chest: Effort normal and breath sounds normal. No respiratory distress. Abdominal: Soft.  There is no tenderness. Psychiatric: Patient has a normal mood and affect. behavior is normal. Judgment and thought content normal.   Recent Results (from the past 2160 hour(s))  POCT HgB A1C     Status: Abnormal   Collection Time: 05/08/22 10:41 AM   Result Value Ref Range   Hemoglobin A1C 6.5 (A) 4.0 - 5.6 %   HbA1c POC (<> result, manual entry)     HbA1c, POC (prediabetic range)     HbA1c, POC (controlled diabetic range)        PHQ2/9:    05/08/2022   10:27 AM 02/06/2022    2:47 PM 11/10/2021    2:50 PM 08/15/2021    3:44 PM 05/02/2021    3:19 PM  Depression screen PHQ 2/9  Decreased Interest 1 0 0 0 0  Down, Depressed, Hopeless 0 0 0 0 0  PHQ - 2 Score 1 0 0 0 0  Altered sleeping 0 0 0    Tired, decreased energy 0 0 0    Change in appetite 0 0 0    Feeling bad or failure about yourself  0 0 0    Trouble concentrating 0 0 0    Moving slowly or fidgety/restless 0 0 0    Suicidal thoughts 0 0 0    PHQ-9 Score 1 0 0    Difficult doing work/chores Not difficult at all  Not difficult at all      phq 9 is positive   Fall Risk:    05/08/2022   10:27 AM 02/06/2022    2:47 PM 11/10/2021    2:49 PM 08/15/2021    3:46 PM 05/02/2021    3:19 PM  Fall Risk   Falls in the past year? 1 1 0 0 0  Number falls in past yr: 0 1 0 0 0  Injury with Fall? 0 0 0 0 0  Risk for fall due to : Impaired balance/gait Impaired balance/gait No Fall Risks No Fall Risks   Follow up Falls prevention  discussed;Education provided Falls prevention discussed Falls prevention discussed Falls prevention discussed       Functional Status Survey: Is the patient deaf or have difficulty hearing?: Yes Does the patient have difficulty seeing, even when wearing glasses/contacts?: No Does the patient have difficulty concentrating, remembering, or making decisions?: Yes Does the patient have difficulty walking or climbing stairs?: Yes Does the patient have difficulty dressing or bathing?: Yes Does the patient have difficulty doing errands alone such as visiting a doctor's office or shopping?: Yes    Assessment & Plan  1. Type 2 diabetes mellitus with microalbuminuria, without long-term current use of insulin (HCC)  - POCT HgB A1C - empagliflozin  (JARDIANCE) 25 MG TABS tablet; Take 1 tablet (25 mg total) by mouth daily.  Dispense: 90 tablet; Refill: 1 - losartan (COZAAR) 25 MG tablet; Take 1 tablet (25 mg total) by mouth daily.  Dispense: 90 tablet; Refill: 1 - SitaGLIPtin-MetFORMIN HCl (JANUMET XR) 50-1000 MG TB24; Take 2 tablets by mouth daily.  Dispense: 180 tablet; Refill: 1  2. Osteopenia of multiple sites  - DG Bone Density; Future  3. Benign essential HTN  - carvedilol (COREG) 12.5 MG tablet; Take 1 tablet (12.5 mg total) by mouth 2 (two) times daily with a meal.  Dispense: 180 tablet; Refill: 1  4. Stage 3a chronic kidney disease ([place])   5. Mild protein-calorie malnutrition ([place])   6. Dementia with behavioral disturbance ([place])   7. Dyslipidemia  - icosapent Ethyl (VASCEPA) 1 g capsule; Take 1 capsule (1 g total) by mouth 2 (two) times daily.  Dispense: 180 capsule; Refill: 1 - rosuvastatin (CRESTOR) 20 MG tablet; Take 1 tablet (20 mg total) by mouth daily.  Dispense: 90 tablet; Refill: 1  8. Controlled gout  - allopurinol (ZYLOPRIM) 100 MG tablet; Take 1 tablet (100 mg total) by mouth 2 (two) times daily.  Dispense: 180 tablet; Refill: 1

## 2022-05-08 ENCOUNTER — Ambulatory Visit (INDEPENDENT_AMBULATORY_CARE_PROVIDER_SITE_OTHER): Payer: Medicare Other | Admitting: Family Medicine

## 2022-05-08 ENCOUNTER — Encounter: Payer: Self-pay | Admitting: Family Medicine

## 2022-05-08 VITALS — BP 126/66 | HR 85 | Temp 98.0°F | Resp 18 | Ht 59.0 in | Wt 143.8 lb

## 2022-05-08 DIAGNOSIS — M109 Gout, unspecified: Secondary | ICD-10-CM

## 2022-05-08 DIAGNOSIS — N1831 Chronic kidney disease, stage 3a: Secondary | ICD-10-CM | POA: Diagnosis not present

## 2022-05-08 DIAGNOSIS — E785 Hyperlipidemia, unspecified: Secondary | ICD-10-CM

## 2022-05-08 DIAGNOSIS — R809 Proteinuria, unspecified: Secondary | ICD-10-CM

## 2022-05-08 DIAGNOSIS — M8589 Other specified disorders of bone density and structure, multiple sites: Secondary | ICD-10-CM | POA: Diagnosis not present

## 2022-05-08 DIAGNOSIS — I10 Essential (primary) hypertension: Secondary | ICD-10-CM | POA: Diagnosis not present

## 2022-05-08 DIAGNOSIS — E1129 Type 2 diabetes mellitus with other diabetic kidney complication: Secondary | ICD-10-CM | POA: Insufficient documentation

## 2022-05-08 DIAGNOSIS — F03918 Unspecified dementia, unspecified severity, with other behavioral disturbance: Secondary | ICD-10-CM

## 2022-05-08 DIAGNOSIS — E441 Mild protein-calorie malnutrition: Secondary | ICD-10-CM | POA: Insufficient documentation

## 2022-05-08 HISTORY — DX: Chronic kidney disease, stage 3a: N18.31

## 2022-05-08 LAB — POCT GLYCOSYLATED HEMOGLOBIN (HGB A1C): Hemoglobin A1C: 6.5 % — AB (ref 4.0–5.6)

## 2022-05-08 MED ORDER — CARVEDILOL 12.5 MG PO TABS: 12.5000 mg | ORAL_TABLET | Freq: Two times a day (BID) | ORAL | 1 refills | Status: DC

## 2022-05-08 MED ORDER — ICOSAPENT ETHYL 1 G PO CAPS: 1.0000 g | ORAL_CAPSULE | Freq: Two times a day (BID) | ORAL | 1 refills | Status: DC

## 2022-05-08 MED ORDER — JANUMET XR 50-1000 MG PO TB24: 2.0000 | ORAL_TABLET | Freq: Every day | ORAL | 1 refills | Status: DC

## 2022-05-08 MED ORDER — ROSUVASTATIN CALCIUM 20 MG PO TABS: 20.0000 mg | ORAL_TABLET | Freq: Every day | ORAL | 1 refills | Status: DC

## 2022-05-08 MED ORDER — EMPAGLIFLOZIN 25 MG PO TABS: 25.0000 mg | ORAL_TABLET | Freq: Every day | ORAL | 1 refills | Status: DC

## 2022-05-08 MED ORDER — LOSARTAN POTASSIUM 25 MG PO TABS: 25.0000 mg | ORAL_TABLET | Freq: Every day | ORAL | 1 refills | Status: DC

## 2022-05-08 MED ORDER — ALLOPURINOL 100 MG PO TABS: 100.0000 mg | ORAL_TABLET | Freq: Two times a day (BID) | ORAL | 1 refills | Status: DC

## 2022-07-04 ENCOUNTER — Ambulatory Visit (INDEPENDENT_AMBULATORY_CARE_PROVIDER_SITE_OTHER): Payer: Medicare Other

## 2022-07-04 DIAGNOSIS — I10 Essential (primary) hypertension: Secondary | ICD-10-CM

## 2022-07-04 DIAGNOSIS — M8589 Other specified disorders of bone density and structure, multiple sites: Secondary | ICD-10-CM

## 2022-07-04 NOTE — Progress Notes (Signed)
Chronic Care Management Pharmacy Note  07/13/2022 Name:  Lindsey [name] MRN:  [id number] DOB:  [birth year]  Summary: Patient presents for CCM follow-up. Today's visit was 100% conducted with [name], the patient's daughter.   -Patient has taken alendronate for 5+ years for her osteopenia.   Recommendations/Changes made from today's visit: -Recommend rechecking DEXA, can potentially stop Alendronate for drug holiday depending on results of imaging.  Plan: No further follow up required: Patient will reach out to clinical team for further questions.  Recommended Problem List Changes:  Add: Chronic Kidney Disease stage 3a   Subjective: Lindsey [name] is an 71 y.o. year old female who is a primary patient of Sowles, Drue Stager, MD.  The CCM team was consulted for assistance with disease management and care coordination needs.    Engaged with patient by telephone for follow up visit in response to provider referral for pharmacy case management and/or care coordination services.   Consent to Services:  The patient was given information about Chronic Care Management services, agreed to services, and gave verbal consent prior to initiation of services.  Please see initial visit note for detailed documentation.   Patient Care Team: Steele Sizer, MD as PCP - General (Family Medicine) Mendel Corning, MD as Consulting Physician (Psychiatry) Anabel Bene, MD as Consulting Physician (Neurology) Germaine Pomfret, [facility] as Pharmacist (Pharmacist)  Recent office visits: 05/08/22: Patient presented to Dr. Ancil Boozer for follow-up.  02/06/2022 Dr. Ancil Boozer MD (PCP) No medication Changes noted  Recent consult visits: 04/04/22: Patient presented to Dr. Donalynn Furlong (Geriatrics)  01/10/2022 Luella Cook PA (Neurology) No medication Changes noted, referral for at-home physical therapy, Follow up 6 months  Hospital visits: None in previous 6 months   Objective:  Lab Results  Component Value Date    CREATININE 1.21 (H) 11/10/2021   BUN 12 11/10/2021   GFRNONAA 57 (L) 08/29/2020   GFRAA 66 08/29/2020   NA 142 11/10/2021   K 3.7 11/10/2021   CALCIUM 10.3 11/10/2021   CO2 28 11/10/2021   GLUCOSE 138 (H) 11/10/2021    Lab Results  Component Value Date/Time   HGBA1C 6.5 (A) 05/08/2022 10:41 AM   HGBA1C 7.1 (A) 11/10/2021 02:57 PM   HGBA1C 6.9 (H) 10/30/2019 09:24 AM   HGBA1C 7.4 (A) 04/22/2019 04:03 PM   HGBA1C 6.7 (H) 10/14/2018 10:01 AM   HGBA1C 6.9 07/04/2018 03:59 PM   MICROALBUR 0.9 11/14/2021 04:17 PM   MICROALBUR 0.3 05/02/2021 04:00 PM   MICROALBUR 20 08/02/2017 02:50 PM   MICROALBUR NEGATIVE 04/06/2016 08:24 AM    Last diabetic Eye exam:  Lab Results  Component Value Date/Time   HMDIABEYEEXA No Retinopathy 02/07/2022 12:00 AM    Last diabetic Foot exam: No results found for: "HMDIABFOOTEX"   Lab Results  Component Value Date   CHOL 100 11/10/2021   HDL 41 (L) 11/10/2021   LDLCALC 35 11/10/2021   TRIG 159 (H) 11/10/2021   CHOLHDL 2.4 11/10/2021       Latest Ref Rng & Units 11/10/2021    3:42 PM 08/29/2020    3:43 PM 02/26/2020    4:30 PM  Hepatic Function  Total Protein 6.1 - 8.1 g/dL 6.0  7.0  6.2   AST 10 - 35 U/L 36  20  17   ALT 6 - 29 U/L '28  17  11   '$ Total Bilirubin 0.2 - 1.2 mg/dL 0.5  0.6  0.5     Lab Results  Component Value Date/Time  TSH 3.83 11/10/2021 03:42 PM   TSH 3.67 08/29/2020 03:43 PM       Latest Ref Rng & Units 11/10/2021    3:42 PM 08/29/2020    3:43 PM 02/26/2020    4:30 PM  CBC  WBC 3.8 - 10.8 Thousand/uL 6.3  9.3  7.5   Hemoglobin 11.7 - 15.5 g/dL 11.7  13.1  11.0   Hematocrit 35.0 - 45.0 % 36.3  39.8  33.5   Platelets 140 - 400 Thousand/uL 161  253  253     Lab Results  Component Value Date/Time   VD25OH 60 11/10/2021 03:42 PM   VD25OH 58 08/29/2020 03:43 PM    Clinical ASCVD: No  The ASCVD Risk score (Arnett DK, et al., 2019) failed to calculate for the following reasons:   The valid total cholesterol range  is 130 to 320 mg/dL       05/08/2022   10:27 AM 02/06/2022    2:47 PM 11/10/2021    2:50 PM  Depression screen PHQ 2/9  Decreased Interest 1 0 0  Down, Depressed, Hopeless 0 0 0  PHQ - 2 Score 1 0 0  Altered sleeping 0 0 0  Tired, decreased energy 0 0 0  Change in appetite 0 0 0  Feeling bad or failure about yourself  0 0 0  Trouble concentrating 0 0 0  Moving slowly or fidgety/restless 0 0 0  Suicidal thoughts 0 0 0  PHQ-9 Score 1 0 0  Difficult doing work/chores Not difficult at all  Not difficult at all    Social History   Tobacco Use  Smoking Status Never  Smokeless Tobacco Never  Tobacco Comments   smoking cessation materials not required   BP Readings from Last 3 Encounters:  05/08/22 126/66  02/06/22 136/72  11/10/21 128/68   Pulse Readings from Last 3 Encounters:  05/08/22 85  02/06/22 90  11/10/21 97   Wt Readings from Last 3 Encounters:  05/08/22 143 lb 12.8 oz (65.2 kg)  02/06/22 145 lb (65.8 kg)  11/10/21 137 lb 14.4 oz (62.6 kg)   BMI Readings from Last 3 Encounters:  05/08/22 29.04 kg/m  02/06/22 29.29 kg/m  11/10/21 27.85 kg/m    Assessment/Interventions: Review of patient past medical history, allergies, medications, health status, including review of consultants reports, laboratory and other test data, was performed as part of comprehensive evaluation and provision of chronic care management services.   SDOH:  (Social Determinants of Health) assessments and interventions performed: Yes SDOH Interventions    Flowsheet Row Chronic Care Management from 01/03/2022 in [facility] from 08/15/2021 in [facility] Management from 05/11/2021 in [facility] Management from 09/07/2020 in [facility] Office Visit from 10/14/2018 in [facility]  SDOH Interventions       Food Insecurity Interventions -- Intervention Not  Indicated -- -- --  Housing Interventions -- Intervention Not Indicated -- -- --  Transportation Interventions -- Intervention Not Indicated -- Intervention Not Indicated --  Depression Interventions/Treatment  -- -- -- -- Currently on Treatment  Financial Strain Interventions Intervention Not Indicated Intervention Not Indicated Intervention Not Indicated Intervention Not Indicated --  Physical Activity Interventions -- Intervention Not Indicated -- -- --  Stress Interventions -- Intervention Not Indicated -- -- --  Social Connections Interventions -- Intervention Not Indicated -- -- --       SDOH Screenings   Food Insecurity:  No Food Insecurity (08/15/2021)  Housing: Low Risk  (08/15/2021)  Transportation Needs: No Transportation Needs (08/15/2021)  Alcohol Screen: Low Risk  (08/15/2021)  Depression (PHQ2-9): Low Risk  (05/08/2022)  Financial Resource Strain: Low Risk  (01/09/2022)  Physical Activity: Inactive (08/15/2021)  Social Connections: Moderately Isolated (08/15/2021)  Stress: Stress Concern Present (08/15/2021)  Tobacco Use: Low Risk  (05/08/2022)    CCM Care Plan  Allergies  Allergen Reactions   Ace Inhibitors     Medications Reviewed Today     Reviewed by Steele Sizer, MD (Physician) on 05/08/22 at 1114  Med List Status: <None>   Medication Order Taking? Sig Documenting Provider Last Dose Status Informant  alendronate (FOSAMAX) 70 MG tablet [id number] Yes Take with a full glass of water on an empty stomach. Steele Sizer, MD Taking Active   allopurinol (ZYLOPRIM) 100 MG tablet [id number] Yes Take 1 tablet (100 mg total) by mouth 2 (two) times daily. Steele Sizer, MD Taking Active   carvedilol (COREG) 12.5 MG tablet [id number] Yes Take 1 tablet (12.5 mg total) by mouth 2 (two) times daily with a meal. Steele Sizer, MD Taking Active   donepezil (ARICEPT) 10 MG tablet [id number] Yes Take 1 tablet (10 mg total) by mouth at bedtime.  Patient taking differently: Take 10  mg by mouth daily. In the morning   Steele Sizer, MD Taking Active   empagliflozin (JARDIANCE) 25 MG TABS tablet [id number] Yes Take 1 tablet (25 mg total) by mouth daily. Steele Sizer, MD Taking Active   GLUCOSE BLOOD VI [id number] Yes  [provider] Taking Active            Med Note Clemetine Marker D   Tue Aug 15, 2021  3:42 PM)    icosapent Ethyl (VASCEPA) 1 g capsule [id number] Yes Take 1 capsule (1 g total) by mouth 2 (two) times daily. Steele Sizer, MD Taking Active   losartan (COZAAR) 25 MG tablet [id number] Yes Take 1 tablet (25 mg total) by mouth daily. Steele Sizer, MD Taking Active   Melatonin 5 MG CAPS [id number] Yes Take 5 mg by mouth at bedtime.  [provider] Taking Active   memantine (NAMENDA) 10 MG tablet [id number] Yes Take 10 mg by mouth 2 (two) times daily. [provider] Taking Active   MULTIPLE VITAMIN PO [id number] Yes Take 1 tablet by mouth daily. [provider] Taking Active            Med Note Carlene Coria   Tue May 02, 2021  3:19 PM)    QUEtiapine (SEROQUEL) 100 MG tablet [id number] Yes Take 1 tablet by mouth in the morning, at noon, and at bedtime. Taking '100mg'$  am, afternoon, and pm Anabel Bene, MD Taking Active   rosuvastatin (CRESTOR) 20 MG tablet [id number] Yes Take 1 tablet by mouth once daily Steele Sizer, MD Taking Active   sertraline (ZOLOFT) 50 MG tablet [id number] Yes Take 50 mg by mouth every evening. [provider] Taking Active   SitaGLIPtin-MetFORMIN HCl (JANUMET XR) 50-1000 MG TB24 [id number] Yes Take 2 tablets by mouth daily. Steele Sizer, MD Taking Active   VITAMIN D PO [id number] Yes Take 1,000 mg by mouth daily.  [provider] Taking Active             Patient Active Problem List   Diagnosis Date Noted   Mild protein-calorie malnutrition ([place]) 05/08/2022   Stage 3a chronic kidney disease ([place]) 05/08/2022   Type 2 diabetes mellitus with microalbuminuria, without  long-term  current use of insulin ([place]) 05/08/2022   Pain due to onychomycosis of toenails of both feet 05/04/2019   Dementia with behavioral disturbance ([place]) 01/09/2016   Mild dementia ([place]) 11/01/2015   Auditory hallucination 07/26/2015   Anemia of chronic disease 07/05/2015   Cataract 04/06/2015   Type 2 diabetes mellitus with diabetic cataract ([place]) 04/06/2015   Benign essential HTN 04/02/2015   Controlled gout 04/02/2015   Dyslipidemia 04/02/2015   Fibromyalgia syndrome 04/02/2015   Chronic insomnia 04/02/2015   Cognitive decline 04/02/2015   Nodular basal cell carcinoma 04/02/2015   Osteopenia 04/02/2015   Calcium blood increased 04/02/2015    Immunization History  Administered Date(s) Administered   Fluad Quad(high Dose 65+) 07/09/2019, 11/10/2021   Influenza Split 03/06/2007   Influenza, High Dose Seasonal PF 07/01/2015, 08/10/2015, 08/06/2016, 08/02/2017, 07/04/2018, 07/04/2020   Influenza-Unspecified 11/19/2014   PFIZER Comirnaty(Gray Top)Covid-19 Tri-Sucrose Vaccine 12/11/2019, 01/01/2020   PFIZER(Purple Top)SARS-COV-2 Vaccination 12/11/2019, 01/01/2020, 08/22/2020   Pneumococcal Conjugate-13 04/06/2016   Pneumococcal Polysaccharide-23 09/03/2006, 11/19/2014   Tdap 11/19/2014   Zoster, Live 11/19/2014    Conditions to be addressed/monitored:  Hypertension, Hyperlipidemia, Diabetes, Depression, Osteopenia, Gout, and Dementia  Care Plan : General Pharmacy (Adult)  Updates made by Germaine Pomfret, [place] since 07/13/2022 12:00 AM     Problem: Hypertension, Hyperlipidemia, Diabetes, Depression, Osteopenia, Gout, and Dementia   Priority: High     Long-Range Goal: Patient-Specific Goal   Start Date: 05/12/2021  Expected End Date: 07/14/2023  This Visit's Progress: On track  Recent Progress: On track  Priority: High  Note:   Current Barriers:  No barriers noted  Pharmacist Clinical Goal(s):  Patient will maintain control of diabetes as evidenced by A1c less  than 8%  through collaboration with PharmD and provider.   Interventions: 1:1 collaboration with Steele Sizer, MD regarding development and update of comprehensive plan of care as evidenced by provider attestation and co-signature Inter-disciplinary care team collaboration (see longitudinal plan of care) Comprehensive medication review performed; medication list updated in electronic medical record  Hypertension (BP goal <140/90) -Controlled -Current treatment: Carvedilol 12.5 mg twice daily: Appropriate, Effective, Safe, Accessible Losartan 25 mg daily: Appropriate, Effective, Safe, Accessible -Medications previously tried: NA  -Current home readings: NA -Denies hypotensive/hypertensive symptoms -Recommended to continue current medication  Hyperlipidemia: (LDL goal < 70) -Controlled -Current treatment: Vascepa 1g twice daily  Rosuvastatin 20 mg daily  -Medications previously tried: NA  -Recommended to continue current medication  Diabetes (A1c goal <8%) -Controlled -Current medications: Jardiance 25 mg daily  Janumet XR 50-1000 2 tablets daily  -Medications previously tried: NA  -Current home glucose readings fasting glucose: Unable to self-monitor.  -Denies hypoglycemic/hyperglycemic symptoms -Recommended to continue current medication  Osteopenia (Goal Prevent bone fractures) -Controlled -Last DEXA Scan: 11/25/18   T-Score femoral neck: -1.9  T-Score total hip: -1.5  T-Score lumbar spine: -1.8  T-Score forearm radius: NA  10-year probability of major osteoporotic fracture: NA  10-year probability of hip fracture: NA -Current treatment  Alendronate 70 mg weekly: Query Appropriate Started Jan 2018 -Medications previously tried: NA  -Recommend rechecking DEXA, can potentially stop Alendronate for drug holiday depending on results of imaging.   Gout (Goal: Prevent gout flares) -Controlled -Current treatment  Allopurinol 100 mg twice daily  -Medications  previously tried: NA  -Recommended to continue current medication  Dementia (Goal: prevent disease progression) -Controlled -Current treatment  Donepzil 10 mg daily  Memantine 10 mg twice daily Sertraline 50 mg nightly  Seroquel 100 mg three times daily  -  Medications previously tried: NA -Most  -Patient continues to experience mild cognitive decline, focusing on stimulating exercises/puzzles.  -Patient has caregivers in AM and PM when daughter is unavailable.   -Recommended to continue current medication  Chronic Kidney Disease Stage 3a  -All medications assessed for renal dosing and appropriateness in chronic kidney disease. -Recommended to continue current medication  Patient Goals/Self-Care Activities Patient will:  - check glucose daily before breakfast, document, and provide at future appointments check blood pressure weekly, document, and provide at future appointments  Follow Up Plan: No further follow up required: Patient will reach out to clinical team for further questions.       Medication Assistance: None required.  Patient affirms current coverage meets needs.  Compliance/Adherence/Medication fill history: Care Gaps: Zoster Vaccines- Shingrix COVID-19 Vaccine Dose 3  Star-Rating Drugs: Jardiance 25 mg last filled on 03/18/2021 for a 90-Day supply with [facility] Losartan 25 mg last filled on 03/13/2021 for a 90-Day supply with [facility] Rosuvastatin 20 mg last filled on 05/09/2021 for a 90-Day supply with [facility] XR 50-1000 mg last filled on 02/25/2021 for a 90-Day supply with [facility]  Patient's preferred pharmacy is:  [facility] [address] [place] [address] Phone: [phone] Fax: [phone]   Uses pill box? Yes Pt endorses 100% compliance  We discussed: Current pharmacy is preferred with insurance plan and patient is satisfied with pharmacy  services Patient decided to: Continue current medication management strategy  Care Plan and Follow Up Patient Decision:  Patient agrees to Care Plan and Follow-up.  Plan: No further follow up required: Patient will reach out to clinical team for further questions.  Malva Limes, [facility] [phone]

## 2022-07-11 ENCOUNTER — Ambulatory Visit
Admission: RE | Admit: 2022-07-11 | Discharge: 2022-07-11 | Disposition: A | Discharge status: Home / Self Care | Payer: Medicare Other | Source: Ambulatory Visit | Attending: Family Medicine | Admitting: Family Medicine

## 2022-07-11 DIAGNOSIS — M8589 Other specified disorders of bone density and structure, multiple sites: Secondary | ICD-10-CM | POA: Diagnosis not present

## 2022-07-13 NOTE — Patient Instructions (Signed)
Visit Information It was great speaking with you today!  Please let me know if you have any questions about our visit.   Goals Addressed             This Visit's Progress    Monitor and Manage My Blood Sugar-Diabetes Type 2   On track    Timeframe:  Long-Range Goal Priority:  High Start Date: 05/12/2021                             Expected End Date: 05/13/2023                       Follow Up within 90 days   - check blood sugar at prescribed times - check blood sugar if I feel it is too high or too low - enter blood sugar readings and medication or insulin into daily log    Why is this important?   Checking your blood sugar at home helps to keep it from getting very high or very low.  Writing the results in a diary or log helps the doctor know how to care for you.  Your blood sugar log should have the time, date and the results.  Also, write down the amount of insulin or other medicine that you take.  Other information, like what you ate, exercise done and how you were feeling, will also be helpful.     Notes:         Patient Care Plan: General Pharmacy (Adult)     Problem Identified: Hypertension, Hyperlipidemia, Diabetes, Depression, Osteopenia, Gout, and Dementia   Priority: High     Long-Range Goal: Patient-Specific Goal   Start Date: 05/12/2021  Expected End Date: 07/14/2023  This Visit's Progress: On track  Recent Progress: On track  Priority: High  Note:   Current Barriers:  No barriers noted  Pharmacist Clinical Goal(s):  Patient will maintain control of diabetes as evidenced by A1c less than 8%  through collaboration with PharmD and provider.   Interventions: 1:1 collaboration with Steele Sizer, MD regarding development and update of comprehensive plan of care as evidenced by provider attestation and co-signature Inter-disciplinary care team collaboration (see longitudinal plan of care) Comprehensive medication review performed; medication list  updated in electronic medical record  Hypertension (BP goal <140/90) -Controlled -Current treatment: Carvedilol 12.5 mg twice daily: Appropriate, Effective, Safe, Accessible Losartan 25 mg daily: Appropriate, Effective, Safe, Accessible -Medications previously tried: NA  -Current home readings: NA -Denies hypotensive/hypertensive symptoms -Recommended to continue current medication  Hyperlipidemia: (LDL goal < 70) -Controlled -Current treatment: Vascepa 1g twice daily  Rosuvastatin 20 mg daily  -Medications previously tried: NA  -Recommended to continue current medication  Diabetes (A1c goal <8%) -Controlled -Current medications: Jardiance 25 mg daily  Janumet XR 50-1000 2 tablets daily  -Medications previously tried: NA  -Current home glucose readings fasting glucose: Unable to self-monitor.  -Denies hypoglycemic/hyperglycemic symptoms -Recommended to continue current medication  Osteopenia (Goal Prevent bone fractures) -Controlled -Last DEXA Scan: 11/25/18   T-Score femoral neck: -1.9  T-Score total hip: -1.5  T-Score lumbar spine: -1.8  T-Score forearm radius: NA  10-year probability of major osteoporotic fracture: NA  10-year probability of hip fracture: NA -Current treatment  Alendronate 70 mg weekly: Query Appropriate Started Jan 2018 -Medications previously tried: NA  -Recommend rechecking DEXA, can potentially stop Alendronate for drug holiday depending on results of imaging.   Gout (Goal: Prevent  gout flares) -Controlled -Current treatment  Allopurinol 100 mg twice daily  -Medications previously tried: NA  -Recommended to continue current medication  Dementia (Goal: prevent disease progression) -Controlled -Current treatment  Donepzil 10 mg daily  Memantine 10 mg twice daily Sertraline 50 mg nightly  Seroquel 100 mg three times daily  -Medications previously tried: NA -Most  -Patient continues to experience mild cognitive decline, focusing on  stimulating exercises/puzzles.  -Patient has caregivers in AM and PM when daughter is unavailable.   -Recommended to continue current medication  Chronic Kidney Disease Stage 3a  -All medications assessed for renal dosing and appropriateness in chronic kidney disease. -Recommended to continue current medication  Patient Goals/Self-Care Activities Patient will:  - check glucose daily before breakfast, document, and provide at future appointments check blood pressure weekly, document, and provide at future appointments  Follow Up Plan: No further follow up required: Patient will reach out to clinical team for further questions.      Patient agreed to services and verbal consent obtained.   Patient verbalizes understanding of instructions and care plan provided today and agrees to view in [facility]. Active MyChart status and patient understanding of how to access instructions and care plan via MyChart confirmed with patient.     Malva Limes, [place] Pharmacist Practitioner  [facility] [phone]

## 2022-07-14 DIAGNOSIS — M810 Age-related osteoporosis without current pathological fracture: Secondary | ICD-10-CM

## 2022-07-14 DIAGNOSIS — Z7984 Long term (current) use of oral hypoglycemic drugs: Secondary | ICD-10-CM | POA: Diagnosis not present

## 2022-07-14 DIAGNOSIS — E1159 Type 2 diabetes mellitus with other circulatory complications: Secondary | ICD-10-CM | POA: Diagnosis not present

## 2022-07-14 DIAGNOSIS — F0390 Unspecified dementia without behavioral disturbance: Secondary | ICD-10-CM

## 2022-07-14 DIAGNOSIS — I129 Hypertensive chronic kidney disease with stage 1 through stage 4 chronic kidney disease, or unspecified chronic kidney disease: Secondary | ICD-10-CM

## 2022-07-14 DIAGNOSIS — E785 Hyperlipidemia, unspecified: Secondary | ICD-10-CM

## 2022-07-14 DIAGNOSIS — N1831 Chronic kidney disease, stage 3a: Secondary | ICD-10-CM

## 2022-08-15 ENCOUNTER — Encounter: Payer: Self-pay | Admitting: Family Medicine

## 2022-08-16 ENCOUNTER — Encounter: Payer: Self-pay | Admitting: Physician Assistant

## 2022-08-16 ENCOUNTER — Emergency Department: Payer: Medicare Other

## 2022-08-16 ENCOUNTER — Emergency Department
Admission: EM | Admit: 2022-08-16 | Discharge: 2022-08-16 | Disposition: A | Discharge status: Home / Self Care | Payer: Medicare Other | Attending: Emergency Medicine | Admitting: Emergency Medicine

## 2022-08-16 ENCOUNTER — Ambulatory Visit: Payer: Self-pay | Admitting: *Deleted

## 2022-08-16 ENCOUNTER — Other Ambulatory Visit: Payer: Self-pay

## 2022-08-16 ENCOUNTER — Ambulatory Visit: Payer: Medicare Other

## 2022-08-16 DIAGNOSIS — R443 Hallucinations, unspecified: Secondary | ICD-10-CM | POA: Insufficient documentation

## 2022-08-16 DIAGNOSIS — N189 Chronic kidney disease, unspecified: Secondary | ICD-10-CM | POA: Diagnosis not present

## 2022-08-16 DIAGNOSIS — R531 Weakness: Secondary | ICD-10-CM | POA: Diagnosis not present

## 2022-08-16 DIAGNOSIS — R4182 Altered mental status, unspecified: Secondary | ICD-10-CM | POA: Diagnosis not present

## 2022-08-16 DIAGNOSIS — F0390 Unspecified dementia without behavioral disturbance: Secondary | ICD-10-CM | POA: Insufficient documentation

## 2022-08-16 DIAGNOSIS — S8991XA Unspecified injury of right lower leg, initial encounter: Secondary | ICD-10-CM | POA: Diagnosis present

## 2022-08-16 DIAGNOSIS — S80211A Abrasion, right knee, initial encounter: Secondary | ICD-10-CM | POA: Diagnosis not present

## 2022-08-16 DIAGNOSIS — W19XXXA Unspecified fall, initial encounter: Secondary | ICD-10-CM | POA: Insufficient documentation

## 2022-08-16 DIAGNOSIS — I129 Hypertensive chronic kidney disease with stage 1 through stage 4 chronic kidney disease, or unspecified chronic kidney disease: Secondary | ICD-10-CM | POA: Diagnosis not present

## 2022-08-16 DIAGNOSIS — E1122 Type 2 diabetes mellitus with diabetic chronic kidney disease: Secondary | ICD-10-CM | POA: Diagnosis not present

## 2022-08-16 LAB — BASIC METABOLIC PANEL
Anion gap: 10 (ref 5–15)
BUN: 23 mg/dL (ref 8–23)
CO2: 25 mmol/L (ref 22–32)
Calcium: 9.7 mg/dL (ref 8.9–10.3)
Chloride: 103 mmol/L (ref 98–111)
Creatinine, Ser: 1.22 mg/dL — ABNORMAL HIGH (ref 0.44–1.00)
GFR, Estimated: 47 mL/min — ABNORMAL LOW (ref >60)
Glucose, Bld: 233 mg/dL — ABNORMAL HIGH (ref 70–99)
Potassium: 4 mmol/L (ref 3.5–5.1)
Sodium: 138 mmol/L (ref 135–145)

## 2022-08-16 LAB — CBC
HCT: 34 % — ABNORMAL LOW (ref 36.0–46.0)
Hemoglobin: 11.2 g/dL — ABNORMAL LOW (ref 12.0–15.0)
MCH: 28.4 pg (ref 26.0–34.0)
MCHC: 32.9 g/dL (ref 30.0–36.0)
MCV: 86.3 fL (ref 80.0–100.0)
Platelets: 194 10*3/uL (ref 150–400)
RBC: 3.94 MIL/uL (ref 3.87–5.11)
RDW: 13.3 % (ref 11.5–15.5)
WBC: 6.9 10*3/uL (ref 4.0–10.5)
nRBC: 0 % (ref 0.0–0.2)

## 2022-08-16 LAB — URINALYSIS, ROUTINE W REFLEX MICROSCOPIC
Bacteria, UA: NONE SEEN
Bilirubin Urine: NEGATIVE
Glucose, UA: >=500 mg/dL — AB
Hgb urine dipstick: NEGATIVE
Ketones, ur: NEGATIVE mg/dL
Leukocytes,Ua: NEGATIVE
Nitrite: NEGATIVE
Protein, ur: NEGATIVE mg/dL
Specific Gravity, Urine: 1.03 (ref 1.005–1.030)
Squamous Epithelial / HPF: NONE SEEN (ref 0–5)
pH: 5 (ref 5.0–8.0)

## 2022-08-16 LAB — CBG MONITORING, ED: Glucose-Capillary: 185 mg/dL — ABNORMAL HIGH (ref 70–99)

## 2022-08-16 LAB — CK: Total CK: 76 U/L (ref 38–234)

## 2022-08-16 MED ORDER — SODIUM CHLORIDE 0.9 % IV BOLUS
500.0000 mL | Freq: Once | INTRAVENOUS | Status: AC
  Administered 2022-08-16: 500 mL via INTRAVENOUS

## 2022-08-16 MED ORDER — ACETAMINOPHEN 325 MG PO TABS
650.0000 mg | ORAL_TABLET | Freq: Once | ORAL | Status: AC
  Administered 2022-08-16: 650 mg via ORAL
  Filled 2022-08-16: qty 2

## 2022-08-16 MED ORDER — LIDOCAINE 5 % EX PTCH
1.0000 | MEDICATED_PATCH | Freq: Once | CUTANEOUS | Status: DC
  Administered 2022-08-16: 1 via TRANSDERMAL
  Filled 2022-08-16: qty 1

## 2022-08-16 MED ORDER — AZITHROMYCIN 250 MG PO TABS: ORAL_TABLET | ORAL | 0 refills | Status: AC

## 2022-08-16 NOTE — ED Notes (Signed)
[facility] in room for pt transport at this time. Bedside report given and care endorsed. IV removed, cath intact, pressure dressing applied, no bleeding noted at site.

## 2022-08-16 NOTE — ED Notes (Signed)
[facility] to transport pt to home

## 2022-08-16 NOTE — ED Provider Notes (Signed)
[facility] Emergency Department Provider Note     Event Date/Time   First MD Initiated Contact with Patient 08/16/22 1511     (approximate)   History   Weakness   HPI  Lindsey [name] is a 71 y.o. female with a history of HTN, HLD, Dementia, CKD, DM, and osteoporosis, presents from home with complaints of generalized weakness and multiple recent falls. Patient presents from home accompanied by her daughter. At least two unwitnessed falls over the last 2 days. Patient is demented at baseline, and has no complaints at this time.  Present at bedside is the patient's part-time caregiver [name]).  Spoke with the patient's daughter via telephone, she will be here after a prescheduled appointment.  She reports finding her mother down at about 7 AM when she came home both this morning and yesterday morning.  The patient is typically put in bed by Loretta at 7 PM each night.  She has no overnight home health care or aides to assist.  The patient could have been down anywhere between 7 PM and 7 AM.  Patient ambulates independently without assistive devices.  [name] would report that the patient has been exhibiting signs of some decreased mobility with transitioning from stand to sit, and transferring to the bed.  Also reports some grimacing and moaning with some transfers within the bed.  Patient presents to the ED at the advice of her primary provider for evaluation of possible UTI.  The caregiver and the daughter would endorse some increased confusion and hallucinations.   Physical Exam   Triage Vital Signs: ED Triage Vitals  Enc Vitals Group     BP 08/16/22 1419 109/61     Pulse Rate 08/16/22 1419 (!) 104     Resp 08/16/22 1419 16     Temp 08/16/22 1419 98.2 F (36.8 C)     Temp Source 08/16/22 1419 Oral     SpO2 08/16/22 1419 93 %     Weight 08/16/22 1420 143 lb 11.8 oz (65.2 kg)     Height 08/16/22 1420 '4\' 11"'$  (1.499 m)     Head Circumference --      Peak  Flow --      Pain Score 08/16/22 1420 0     Pain Loc --      Pain Edu? --      Excl. in [place]? --     Most recent vital signs: Vitals:   08/16/22 2222 08/16/22 2339  BP: 105/61 98/61  Pulse: 84 76  Resp: 16 16  Temp: 98.1 F (36.7 C) 98.2 F (36.8 C)  SpO2: 96% 96%    General Awake, no distress. NAD. Alert to person and place. HEENT NCAT. PERRL. EOMI. No rhinorrhea. Mucous membranes are moist. CV:  Good peripheral perfusion.  RESP:  Normal effort.  ABD:  No distention. Soft, nontender, normal bowel sounds MSK:  Normal full AROM. Right knee with superficial abrasion and local erythema. Normal hip/knee flexion and extension bilaterally   ED Results / Procedures / Treatments   Labs (all labs ordered are listed, but only abnormal results are displayed) Labs Reviewed  BASIC METABOLIC PANEL - Abnormal; Notable for the following components:      Result Value   Glucose, Bld 233 (*)    Creatinine, Ser 1.22 (*)    GFR, Estimated 47 (*)    All other components within normal limits  CBC - Abnormal; Notable for the following components:   Hemoglobin 11.2 (*)  HCT 34.0 (*)    All other components within normal limits  URINALYSIS, ROUTINE W REFLEX MICROSCOPIC - Abnormal; Notable for the following components:   Color, Urine YELLOW (*)    APPearance HAZY (*)    Glucose, UA >=500 (*)    All other components within normal limits  CBG MONITORING, ED - Abnormal; Notable for the following components:   Glucose-Capillary 185 (*)    All other components within normal limits  CK     EKG  Vent. rate 101 BPM PR interval 172 ms QRS duration 100 ms QT/QTcB 372/482 ms P-R-T axes 35 -3 30 Sinus tach   RADIOLOGY  I personally viewed and evaluated these images as part of my medical decision making, as well as reviewing the written report by the radiologist.  ED Provider Interpretation: no acute findings  No results found.   PROCEDURES:  Critical Care performed:  No  Procedures   MEDICATIONS ORDERED IN ED: Medications  acetaminophen (TYLENOL) tablet 650 mg (650 mg Oral Given 08/16/22 1813)  sodium chloride 0.9 % bolus 500 mL (0 mLs Intravenous Stopped 08/16/22 2016)     IMPRESSION / MDM / ASSESSMENT AND PLAN / ED COURSE  I reviewed the triage vital signs and the nursing notes.                              Differential diagnosis includes, but is not limited to, alcohol, illicit or prescription medications, or other toxic ingestion; intracranial pathology such as stroke or intracerebral hemorrhage; fever or infectious causes including sepsis; hypoxemia and/or hypercarbia; uremia; trauma; endocrine related disorders such as diabetes, hypoglycemia, and thyroid-related diseases; hypertensive encephalopathy; etc.   Patient's presentation is most consistent with acute presentation with potential threat to life or bodily function.  The patient is on the cardiac monitor to evaluate for evidence of arrhythmia and/or significant heart rate changes.  Geriatric patient to the ED for evaluation of some concerns for falls as well as some increased or recurrent hallucinations.  Patient was evaluated for complaints in the ED given some concern from her caregiver and daughter for some decreased mobility.  Evaluation including CT scanning of the head, neck, and lumbar spine all normal and reassuring without any signs of acute intracranial process or fracture.  Further evaluation including chest x-ray, plain films of the knee, hips and pelvis were also without any indication of acute fracture or dislocation.  No lab abnormalities noted and no evidence of UTI on exam.  Reviewed the images as well as labs and my review and interpretation is within normal limits.  Patient's diagnosis is consistent with a fall at home with altered mental status.  No evidence of any acute intracranial process, acute orthopedic injury, lab abnormality, malignant arrhythmias or infectious  processes.  Patient with a history of hallucinations with her dementia is encouraged to follow with primary provider and gerontologist for any medication adjustments.  Patient's daughter is also encouraged to provide some overnight monitoring to ensure no further injury related to falls.  Patient will be treated empirically with azithromycin for some concerning bilateral opacities on her chest x-ray. Patient is given ED precautions to return to he ED for any worsening or new symptoms.     FINAL CLINICAL IMPRESSION(S) / ED DIAGNOSES   Final diagnoses:  Fall, initial encounter  Altered mental status, unspecified altered mental status type     Rx / DC Orders   ED Discharge Orders  Ordered    azithromycin (ZITHROMAX Z-PAK) 250 MG tablet        08/16/22 1958             Note:  This document was prepared using Dragon voice recognition software and may include unintentional dictation errors.    Melvenia Needles, PA-C 08/18/22 0006    Duffy Bruce, MD 08/19/22 [phone]

## 2022-08-16 NOTE — Telephone Encounter (Signed)
  Chief Complaint: falls x 3, hallucinating, weakness unable to get patient in car per daughter who is not with patient now  Symptoms: daughter who is guardian, reports patient found sitting in  LR floor x 1 in urine and another time sitting in LR floor not in urine and again laying in floor on back. Falls not witnessed. Unsure of hitting head. Reports hallucinating, hx dementia Frequency: x 3 days  Pertinent Negatives: Patient denies na  Disposition: '[x]'$ ED /'[]'$ Urgent Care (no appt availability in office) / '[]'$ Appointment(In office/virtual)/ '[]'$  [facility]/ '[]'$ Home Care/ '[]'$ Refused Recommended Disposition /'[]'$ [facility]/ '[]'$  Follow-up with PCP Additional Notes:   Instructed patient's daughter to call 911 and have patient evaluated in ED now. Unsure of disposition.     Reason for Disposition  Sounds like a life-threatening emergency to the triager  Answer Assessment - Initial Assessment Questions 1. MECHANISM: "How did the fall happen?"     Patient 's daughter reports patient has been found sitting in living room floor x 2 and laying in floor x 1 on back over the past 3 days. Not witnessed 2. DOMESTIC VIOLENCE AND ELDER ABUSE SCREENING: "Did you fall because someone pushed you or tried to hurt you?" If Yes, ask: "Are you safe now?"     na 3. ONSET: "When did the fall happen?" (e.g., minutes, hours, or days ago)     Past 3 days  4. LOCATION: "What part of the body hit the ground?" (e.g., back, buttocks, head, hips, knees, hands, head, stomach)     Unsure, patient has dementia and has been hallucinating  5. INJURY: "Did you hurt (injure) yourself when you fell?" If Yes, ask: "What did you injure? Tell me more about this?" (e.g., body area; type of injury; pain severity)"     na 6. PAIN: "Is there any pain?" If Yes, ask: "How bad is the pain?" (e.g., Scale 1-10; or mild,  moderate, severe)   - NONE (0): No pain   - MILD (1-3): Doesn't interfere with normal activities    -  MODERATE (4-7): Interferes with normal activities or awakens from sleep    - SEVERE (8-10): Excruciating pain, unable to do any normal activities      na 7. SIZE: For cuts, bruises, or swelling, ask: "How large is it?" (e.g., inches or centimeters)      na 8. PREGNANCY: "Is there any chance you are pregnant?" "When was your last menstrual period?"     na 9. OTHER SYMPTOMS: "Do you have any other symptoms?" (e.g., dizziness, fever, weakness; new onset or worsening).      Hallucinating , hx dementia, fallen or found on floor x 3. Unknown if hit head 10. CAUSE: "What do you think caused the fall (or falling)?" (e.g., tripped, dizzy spell)       UTI  Protocols used: Falls and [facility]

## 2022-08-16 NOTE — ED Triage Notes (Signed)
Pt here with weakness and multiple falls. Pt c/o back pain and soreness, falls unwitnessed. Pt is not on blood thinners, hx of dementia.

## 2022-08-16 NOTE — Discharge Instructions (Addendum)
Miss Lindsey [name] has a normal exam today. Her XRs and CT scans do not show any head injury or acute spinal fractures. She will be discharged with Lidoderm patches for her back pain and antibiotics for possible early pnuemonia. Encourage her to use a walker to ambulate. Follow-up with her primary providers for continued concerns.

## 2022-08-16 NOTE — ED Triage Notes (Signed)
First Nurse Note:  Arrives from home via [facility].  C/O daughter finding mother on the floor at home the last two days.  Per report, patient has caregivers in the morning and at night.  PCP wanted patient sent to ED for evaluation for possible UTI.  VS wnl.  Hx Dementia.  No obvious injuries, bruises, or complaint.

## 2022-08-18 NOTE — ED Provider Notes (Incomplete)
[name] Provider Note     Event Date/Time   First MD Initiated Contact with Patient 08/16/22 1511     (approximate)   History   Weakness   HPI  Lindsey [name] is a 72 y.o. female with a history of HTN, HLD, Dementia, CKD, DM, and osteoporosis, presents from home with complaints of generalized weakness and multiple recent falls. Patient presents from home accompanied by her daughter. At least two unwitnessed falls over the last 2 days. Patient is demented at baseline, and has no complaints at this time.  Present at bedside is the patient's part-time caregiver [name]).  Spoke with the patient's daughter via telephone, she will be here after a prescheduled appointment.  She reports finding her mother down at about 7 AM when she came home both this morning and yesterday morning.  The patient is typically put in bed by Loretta at 7 PM each night.  She has no overnight home health care or aides to assist.  The patient could have been down anywhere between 7 PM and 7 AM.  Patient ambulates independently without assistive devices.  [name] would report that the patient has been exhibiting signs of some decreased mobility with transitioning from stand to sit, and transferring to the bed.  Also reports some grimacing and moaning with some transfers within the bed.  Patient presents to the ED at the advice of her primary provider for evaluation of possible UTI.  The caregiver and the daughter would endorse some increased confusion and hallucinations.   Physical Exam   Triage Vital Signs: ED Triage Vitals  Enc Vitals Group     BP 08/16/22 1419 109/61     Pulse Rate 08/16/22 1419 (!) 104     Resp 08/16/22 1419 16     Temp 08/16/22 1419 98.2 F (36.8 C)     Temp Source 08/16/22 1419 Oral     SpO2 08/16/22 1419 93 %     Weight 08/16/22 1420 143 lb 11.8 oz (65.2 kg)     Height 08/16/22 1420 '4\' 11"'$  (1.499 m)     Head Circumference --      Peak  Flow --      Pain Score 08/16/22 1420 0     Pain Loc --      Pain Edu? --      Excl. in [place]? --     Most recent vital signs: Vitals:   08/16/22 2222 08/16/22 2339  BP: 105/61 98/61  Pulse: 84 76  Resp: 16 16  Temp: 98.1 F (36.7 C) 98.2 F (36.8 C)  SpO2: 96% 96%    General Awake, no distress. NAD. Alert to person and place. HEENT NCAT. PERRL. EOMI. No rhinorrhea. Mucous membranes are moist. CV:  Good peripheral perfusion.  RESP:  Normal effort.  ABD:  No distention. Soft, nontender, normal bowel sounds MSK:  Normal full AROM. Right knee with superficial abrasion and local erythema. Normal hip/knee flexion and extension bilaterally   ED Results / Procedures / Treatments   Labs (all labs ordered are listed, but only abnormal results are displayed) Labs Reviewed  BASIC METABOLIC PANEL - Abnormal; Notable for the following components:      Result Value   Glucose, Bld 233 (*)    Creatinine, Ser 1.22 (*)    GFR, Estimated 47 (*)    All other components within normal limits  CBC - Abnormal; Notable for the following components:   Hemoglobin 11.2 (*)  HCT 34.0 (*)    All other components within normal limits  URINALYSIS, ROUTINE W REFLEX MICROSCOPIC - Abnormal; Notable for the following components:   Color, Urine YELLOW (*)    APPearance HAZY (*)    Glucose, UA >=500 (*)    All other components within normal limits  CBG MONITORING, ED - Abnormal; Notable for the following components:   Glucose-Capillary 185 (*)    All other components within normal limits  CK     EKG  Vent. rate 101 BPM PR interval 172 ms QRS duration 100 ms QT/QTcB 372/482 ms P-R-T axes 35 -3 30 Sinus tach   RADIOLOGY  I personally viewed and evaluated these images as part of my medical decision making, as well as reviewing the written report by the radiologist.  ED Provider Interpretation: no acute findings  No results found.   PROCEDURES:  Critical Care performed:  No  Procedures   MEDICATIONS ORDERED IN ED: Medications  acetaminophen (TYLENOL) tablet 650 mg (650 mg Oral Given 08/16/22 1813)  sodium chloride 0.9 % bolus 500 mL (0 mLs Intravenous Stopped 08/16/22 2016)     IMPRESSION / MDM / ASSESSMENT AND PLAN / ED COURSE  I reviewed the triage vital signs and the nursing notes.                              Differential diagnosis includes, but is not limited to, alcohol, illicit or prescription medications, or other toxic ingestion; intracranial pathology such as stroke or intracerebral hemorrhage; fever or infectious causes including sepsis; hypoxemia and/or hypercarbia; uremia; trauma; endocrine related disorders such as diabetes, hypoglycemia, and thyroid-related diseases; hypertensive encephalopathy; etc.   Patient's presentation is most consistent with acute presentation with potential threat to life or bodily function.  The patient is on the cardiac monitor to evaluate for evidence of arrhythmia and/or significant heart rate changes.  Geriatric patient to the ED for evaluation of some concerns for falls as well as some increased or recurrent hallucinations.  Patient was evaluated for complaints in the ED given some concern from her caregiver and daughter for some decreased mobility.  Evaluation including CT scanning of the head, neck patient's diagnosis is consistent with ***. Patient will be discharged home with prescriptions for ***. Patient is to follow up with *** as needed or otherwise directed. Patient is given ED precautions to return to the ED for any worsening or new symptoms.     FINAL CLINICAL IMPRESSION(S) / ED DIAGNOSES   Final diagnoses:  Fall, initial encounter  Altered mental status, unspecified altered mental status type     Rx / DC Orders   ED Discharge Orders          Ordered    azithromycin (ZITHROMAX Z-PAK) 250 MG tablet        08/16/22 1958             Note:  This document was prepared using Dragon  voice recognition software and may include unintentional dictation errors.

## 2022-08-20 ENCOUNTER — Ambulatory Visit: Payer: Medicare Other | Admitting: Family Medicine

## 2022-08-22 ENCOUNTER — Ambulatory Visit (INDEPENDENT_AMBULATORY_CARE_PROVIDER_SITE_OTHER): Payer: Medicare Other | Admitting: Family Medicine

## 2022-08-22 ENCOUNTER — Encounter: Payer: Self-pay | Admitting: Family Medicine

## 2022-08-22 DIAGNOSIS — Z0000 Encounter for general adult medical examination without abnormal findings: Secondary | ICD-10-CM

## 2022-08-22 NOTE — Progress Notes (Signed)
Annual Wellness Visit   Patient: Lindsey [name], Female    DOB: 1951/06/21, 71 y.o.   MRN: [id number]  Subjective  Chief Complaint  Patient presents with   Medicare Wellness    Lindsey [name] is a 71 y.o. female who presents today for her Annual Wellness Visit. Entirety of visit conducted with daughter, patient's guardian. She reports consuming a general diet. The patient does not participate in regular exercise at present. Walks some out of the house. She generally feels fairly well. She reports sleeping well. She does not have additional problems to discuss today.   HPI  Vision:Within last year and Dental: No current dental problems and No regular dental care . Has partial dentures.   Patient Active Problem List   Diagnosis Date Noted   Mild protein-calorie malnutrition ([place]) 05/08/2022   Stage 3a chronic kidney disease ([place]) 05/08/2022   Type 2 diabetes mellitus with microalbuminuria, without long-term current use of insulin ([place]) 05/08/2022   Pain due to onychomycosis of toenails of both feet 05/04/2019   Dementia with behavioral disturbance ([place]) 01/09/2016   Mild dementia ([place]) 11/01/2015   Auditory hallucination 07/26/2015   Anemia of chronic disease 07/05/2015   Cataract 04/06/2015   Type 2 diabetes mellitus with diabetic cataract ([place]) 04/06/2015   Benign essential HTN 04/02/2015   Controlled gout 04/02/2015   Dyslipidemia 04/02/2015   Fibromyalgia syndrome 04/02/2015   Chronic insomnia 04/02/2015   Cognitive decline 04/02/2015   Nodular basal cell carcinoma 04/02/2015   Osteopenia 04/02/2015   Calcium blood increased 04/02/2015   Social History   Tobacco Use   Smoking status: Never   Smokeless tobacco: Never   Tobacco comments:    smoking cessation materials not required  Vaping Use   Vaping Use: Never used  Substance Use Topics   Alcohol use: No   Drug use: No      Medications: Outpatient Medications Prior to Visit  Medication Sig   alendronate  (FOSAMAX) 70 MG tablet Take with a full glass of water on an empty stomach.   allopurinol (ZYLOPRIM) 100 MG tablet Take 1 tablet (100 mg total) by mouth 2 (two) times daily.   carvedilol (COREG) 12.5 MG tablet Take 1 tablet (12.5 mg total) by mouth 2 (two) times daily with a meal.   donepezil (ARICEPT) 10 MG tablet Take 1 tablet (10 mg total) by mouth at bedtime. (Patient taking differently: Take 10 mg by mouth daily. In the morning)   empagliflozin (JARDIANCE) 25 MG TABS tablet Take 1 tablet (25 mg total) by mouth daily.   GLUCOSE BLOOD VI    icosapent Ethyl (VASCEPA) 1 g capsule Take 1 capsule (1 g total) by mouth 2 (two) times daily.   losartan (COZAAR) 25 MG tablet Take 1 tablet (25 mg total) by mouth daily.   Melatonin 5 MG CAPS Take 5 mg by mouth at bedtime.    memantine (NAMENDA) 10 MG tablet Take 10 mg by mouth 2 (two) times daily.   MULTIPLE VITAMIN PO Take 1 tablet by mouth daily.   QUEtiapine (SEROQUEL) 100 MG tablet Take 1 tablet by mouth in the morning, at noon, and at bedtime. Taking '100mg'$  am, afternoon, and pm   rosuvastatin (CRESTOR) 20 MG tablet Take 1 tablet (20 mg total) by mouth daily.   sertraline (ZOLOFT) 50 MG tablet Take 50 mg by mouth every evening.   SitaGLIPtin-MetFORMIN HCl (JANUMET XR) 50-1000 MG TB24 Take 2 tablets by mouth daily.   VITAMIN D PO Take  1,000 mg by mouth daily.    No facility-administered medications prior to visit.    Allergies  Allergen Reactions   Ace Inhibitors     Patient Care Team: Steele Sizer, MD as PCP - General (Family Medicine) Mendel Corning, MD as Consulting Physician (Psychiatry) Anabel Bene, MD as Consulting Physician (Neurology) Germaine Pomfret, [facility] as Pharmacist (Pharmacist)  ROS      Objective  There were no vitals taken for this visit. BP Readings from Last 3 Encounters:  08/16/22 98/61  05/08/22 126/66  02/06/22 136/72   Wt Readings from Last 3 Encounters:  08/16/22 143 lb 11.8 oz (65.2 kg)   05/08/22 143 lb 12.8 oz (65.2 kg)  02/06/22 145 lb (65.8 kg)      Physical Exam Entirety of visit conducted over the phone with patient's guardian, daughter Lindsey [name] given patient dementia.  Most recent functional status assessment:    08/22/2022    1:09 PM  In your present state of health, do you have any difficulty performing the following activities:  Hearing? 0  Vision? 0  Difficulty concentrating or making decisions? 1  Walking or climbing stairs? 1  Dressing or bathing? 1  Doing errands, shopping? 1   Most recent fall risk assessment:    08/22/2022    1:09 PM  [place] in the past year? 1  Number falls in past yr: 1  Injury with Fall? 0  Risk for fall due to : History of fall(s);Impaired mobility  Follow up Falls prevention discussed;Education provided;Falls evaluation completed    Most recent depression screenings:    08/22/2022    1:09 PM 05/08/2022   10:27 AM  PHQ 2/9 Scores  PHQ - 2 Score 0 1  PHQ- 9 Score 0 1   Most recent cognitive screening:    08/22/2022    1:10 PM  6CIT Screen  What Year? 4 points  What month? 3 points  What time? 3 points  Count back from 20 4 points  Months in reverse 4 points  Repeat phrase 10 points  Total Score 28 points   Most recent Audit-C alcohol use screening    08/22/2022    1:10 PM  Alcohol Use Disorder Test (AUDIT)  1. How often do you have a drink containing alcohol? 0  2. How many drinks containing alcohol do you have on a typical day when you are drinking? 0  3. How often do you have six or more drinks on one occasion? 0  AUDIT-C Score 0   A score of 3 or more in women, and 4 or more in men indicates increased risk for alcohol abuse, EXCEPT if all of the points are from question 1   Vision/Hearing Screen: No results found.  Last CBC Lab Results  Component Value Date   WBC 6.9 08/16/2022   HGB 11.2 (L) 08/16/2022   HCT 34.0 (L) 08/16/2022   MCV 86.3 08/16/2022   MCH 28.4 08/16/2022   RDW  13.3 08/16/2022   PLT 194 64/40/3474   Last metabolic panel Lab Results  Component Value Date   GLUCOSE 233 (H) 08/16/2022   NA 138 08/16/2022   K 4.0 08/16/2022   CL 103 08/16/2022   CO2 25 08/16/2022   BUN 23 08/16/2022   CREATININE 1.22 (H) 08/16/2022   GFRNONAA 47 (L) 08/16/2022   CALCIUM 9.7 08/16/2022   PROT 6.0 (L) 11/10/2021   ALBUMIN 3.8 11/05/2019   LABGLOB 2.2 12/15/2015   AGRATIO 1.9  12/15/2015   BILITOT 0.5 11/10/2021   ALKPHOS 61 11/05/2019   AST 36 (H) 11/10/2021   ALT 28 11/10/2021   ANIONGAP 10 08/16/2022   Last lipids Lab Results  Component Value Date   CHOL 100 11/10/2021   HDL 41 (L) 11/10/2021   LDLCALC 35 11/10/2021   TRIG 159 (H) 11/10/2021   CHOLHDL 2.4 11/10/2021   Last hemoglobin A1c Lab Results  Component Value Date   HGBA1C 6.5 (A) 05/08/2022      No results found for any visits on 08/22/22.    Assessment & Plan   Annual wellness visit done today including the all of the following: Reviewed patient's Family Medical History Reviewed and updated list of patient's medical providers Assessment of cognitive impairment was done Assessed patient's functional ability Established a written schedule for health screening [place] Completed and Reviewed  Exercise Activities and Dietary recommendations  Goals      Exercise 150 min/wk Moderate Activity     Recommend to exercise for at least 150 minutes per week.     Increase water intake     Recommend increasing water intake to 6 glasses a day.      Monitor and Manage My Blood Sugar-Diabetes Type 2     Timeframe:  Long-Range Goal Priority:  High Start Date: 05/12/2021                             Expected End Date: 05/13/2023                       Follow Up within 90 days   - check blood sugar at prescribed times - check blood sugar if I feel it is too high or too low - enter blood sugar readings and medication or insulin into daily log    Why is this  important?   Checking your blood sugar at home helps to keep it from getting very high or very low.  Writing the results in a diary or log helps the doctor know how to care for you.  Your blood sugar log should have the time, date and the results.  Also, write down the amount of insulin or other medicine that you take.  Other information, like what you ate, exercise done and how you were feeling, will also be helpful.     Notes:         Immunization History  Administered Date(s) Administered   Fluad Quad(high Dose 65+) 07/09/2019, 11/10/2021   Influenza Split 03/06/2007   Influenza, High Dose Seasonal PF 07/01/2015, 08/10/2015, 08/06/2016, 08/02/2017, 07/04/2018, 07/04/2020   Influenza-Unspecified 11/19/2014   PFIZER Comirnaty(Gray Top)Covid-19 Tri-Sucrose Vaccine 12/11/2019, 01/01/2020   PFIZER(Purple Top)SARS-COV-2 Vaccination 12/11/2019, 01/01/2020, 08/22/2020   Pneumococcal Conjugate-13 04/06/2016   Pneumococcal Polysaccharide-23 09/03/2006, 11/19/2014   Tdap 11/19/2014   Zoster, Live 11/19/2014    Health Maintenance  Topic Date Due   Zoster Vaccines- Shingrix (1 of 2) Never done   INFLUENZA VACCINE  05/15/2022   COVID-19 Vaccine (6 - Pfizer risk series) 09/07/2022 (Originally 10/17/2020)   HEMOGLOBIN A1C  11/08/2022   FOOT EXAM  11/10/2022   Diabetic kidney evaluation - Urine ACR  11/14/2022   OPHTHALMOLOGY EXAM  02/08/2023   COLONOSCOPY (Pts 45-49yr Insurance coverage will need to be confirmed)  04/25/2023   Diabetic kidney evaluation - GFR measurement  08/17/2023   Medicare Annual Wellness (AWV)  08/23/2023   TETANUS/TDAP  11/19/2024   Pneumonia Vaccine 75+ Years old  Completed   DEXA SCAN  Completed   Hepatitis C Screening  Completed   HPV VACCINES  Aged Out   Advanced Care Planning: A voluntary discussion about advance care planning including the explanation and discussion of advance directives.  Discussed health care proxy and Living will.  Patient does not have  a living will at present time. She is under the care of her guardian, daughter Wende Mott. Guardianship papers are scanned into her chart.   Discussed health benefits of physical activity, and encouraged her to engage in regular exercise appropriate for her age and condition.    Problem List Items Addressed This Visit   None Visit Diagnoses     Encounter for Medicare annual wellness exam    -  Primary       Return in about 1 year (around 08/23/2023) for awv.     Myles Gip, DO

## 2022-08-22 NOTE — Patient Instructions (Signed)
Things to do to keep yourself healthy  - Exercise at least 30-45 minutes a day, 3-4 days a week.  - Eat a low-fat diet with lots of fruits and vegetables, up to 7-9 servings per day.  - Seatbelts can save your life. Wear them always.  - Smoke detectors on every level of your home, check batteries every year.  - Eye Doctor - have an eye exam every 1-2 years  - Safe sex - if you may be exposed to STDs, use a condom.  - Alcohol -  If you drink, do it moderately, less than 2 drinks per day.  - [place]. Choose someone to speak for you if you are not able. https://www.prepareforyourcare.org is a great website to help you navigate this. - Depression is common in our stressful world.If you're feeling down or losing interest in things you normally enjoy, please come in for a visit.  - Violence - If anyone is threatening or hurting you, please call immediately.  Check with your pharmacy on getting updated with your flu, shingles, and COVID vaccines. You can also receive your flu shot at your next appointment with [place] in a couple weeks.

## 2022-09-04 NOTE — Progress Notes (Unsigned)
Name: Lindsey [name]   MRN: [id number]    DOB: 1950/12/17   Date:09/05/2022       Progress Note  Subjective  Chief Complaint  Follow Up  HPI  DMII:   HgbA1C has been out of control 8.1%, 8.2% 8.2% , 6.9%, 6.7% , 7.4% , 7 % ,6.8 % 6.5 % , 7.1 % 6.5 % today it is 7.4%   She has an aid coming to her house daily for breakfast , she also has an aid during dinner time and recently due to weight loss a family member has been going to the house to make sure she eats lunch.   She denies polyphagia, polydipsia or polyuria. No recent episodes of hypoglycemia  She has obesity , CKI stage III , and last urine micro was up from 8 to 31  dyslipidemia.she is on statin therapy and ARB.  A1C is up but for the past month she has been drinking more juices and eating less, she was diagnosed with early pneumonia , we will monitor    Osteopenia: she was on Evista, but switched to Fosamax because of insurance changes. She is doing well, last  bone density was done again 06/2022, she has been on medication for over 5 years now and we will stop it   Auditory Hallucination/Dementia: seen by Dr. Melrose Nakayama and also  by  Dr. Donalynn Furlong - geriatrician. She is still taking Seroquel  100 mg TID  and 10 mg of  Aricept, and Namenda twice daily and zoloft as prescribed. Daughter states she is doing well lately, not seeing or hearing things for months now. Stable on medications.   She went to [facility] early Nov due to falls, she had decreased her appetite and falls and was diagnosed with early pneumonia, she is doing better now.    HTN:  She denies chest pain, palpitation or dizziness.  She is compliant with medication - daughter dispenses it .    Hyperlipidemia: taking Crestor and denies side effects of medication, including no muscle ache LDL at goal at 35 ( 01/23). Continue current medications.    Major Depression in remission: daughter does not think she is depressed but is anxious, gets frustrated for not remembering things at times but  not as often as it used to be. She is compliant with zoloft and seroquel  She still lives alone, daughter manages  her bills, doctors visits, and she also dispenses her medications, she has an  Aid that spends the mornings with her,family pays for an aid to go at night to feed and bath her but she is able to sleep on her own - she does not get out of bed and they have a camera that allows them to watch her .  She forgets to eat or picks at her food, but weight is stable now . She needs assistance bathing, getting dressed, taking medications, no longer cooks or cleans. She is unable to use microwave or even open pre package meals by herself , she needs 24 hour care and family , she currently got approved for 3-4 hours per day with her insurance  Patient Active Problem List   Diagnosis Date Noted   Mild protein-calorie malnutrition ([place]) 05/08/2022   Stage 3a chronic kidney disease ([place]) 05/08/2022   Type 2 diabetes mellitus with microalbuminuria, without long-term current use of insulin ([place]) 05/08/2022   Pain due to onychomycosis of toenails of both feet 05/04/2019   Dementia with behavioral disturbance ([place]) 01/09/2016  Mild dementia ([place]) 11/01/2015   Auditory hallucination 07/26/2015   Anemia of chronic disease 07/05/2015   Cataract 04/06/2015   Type 2 diabetes mellitus with diabetic cataract ([place]) 04/06/2015   Benign essential HTN 04/02/2015   Controlled gout 04/02/2015   Dyslipidemia 04/02/2015   Fibromyalgia syndrome 04/02/2015   Chronic insomnia 04/02/2015   Cognitive decline 04/02/2015   Nodular basal cell carcinoma 04/02/2015   Osteopenia 04/02/2015   Calcium blood increased 04/02/2015    Past Surgical History:  Procedure Laterality Date   CESAREAN SECTION     TONSILECTOMY, ADENOIDECTOMY, BILATERAL MYRINGOTOMY AND TUBES     TUBAL LIGATION      Family History  Problem Relation Age of Onset   Hypertension Mother    Osteoporosis Mother    Cancer Mother        brain tumor    Hodgkin's lymphoma Father    Hypertension Sister    Cancer Sister        unknown   Hypertension Sister    Diabetes Sister    Cancer Sister 43       breast   Breast cancer Sister 49   Cancer Maternal Uncle     Social History   Tobacco Use   Smoking status: Never   Smokeless tobacco: Never   Tobacco comments:    smoking cessation materials not required  Substance Use Topics   Alcohol use: No     Current Outpatient Medications:    alendronate (FOSAMAX) 70 MG tablet, Take with a full glass of water on an empty stomach., Disp: 12 tablet, Rfl: 3   allopurinol (ZYLOPRIM) 100 MG tablet, Take 1 tablet (100 mg total) by mouth 2 (two) times daily., Disp: 180 tablet, Rfl: 1   carvedilol (COREG) 12.5 MG tablet, Take 1 tablet (12.5 mg total) by mouth 2 (two) times daily with a meal., Disp: 180 tablet, Rfl: 1   donepezil (ARICEPT) 10 MG tablet, Take 1 tablet (10 mg total) by mouth at bedtime. (Patient taking differently: Take 10 mg by mouth daily. In the morning), Disp: 90 tablet, Rfl: 1   empagliflozin (JARDIANCE) 25 MG TABS tablet, Take 1 tablet (25 mg total) by mouth daily., Disp: 90 tablet, Rfl: 1   GLUCOSE BLOOD VI, , Disp: , Rfl:    icosapent Ethyl (VASCEPA) 1 g capsule, Take 1 capsule (1 g total) by mouth 2 (two) times daily., Disp: 180 capsule, Rfl: 1   losartan (COZAAR) 25 MG tablet, Take 1 tablet (25 mg total) by mouth daily., Disp: 90 tablet, Rfl: 1   Melatonin 5 MG CAPS, Take 5 mg by mouth at bedtime. , Disp: , Rfl:    memantine (NAMENDA) 10 MG tablet, Take 10 mg by mouth 2 (two) times daily., Disp: , Rfl:    MULTIPLE VITAMIN PO, Take 1 tablet by mouth daily., Disp: , Rfl:    QUEtiapine (SEROQUEL) 100 MG tablet, Take 1 tablet by mouth in the morning, at noon, and at bedtime. Taking '100mg'$  am, afternoon, and pm, Disp: , Rfl:    rosuvastatin (CRESTOR) 20 MG tablet, Take 1 tablet (20 mg total) by mouth daily., Disp: 90 tablet, Rfl: 1   sertraline (ZOLOFT) 50 MG tablet, Take 50 mg by  mouth every evening., Disp: , Rfl:    SitaGLIPtin-MetFORMIN HCl (JANUMET XR) 50-1000 MG TB24, Take 2 tablets by mouth daily., Disp: 180 tablet, Rfl: 1   VITAMIN D PO, Take 1,000 mg by mouth daily. , Disp: , Rfl:   Allergies  Allergen Reactions  Ace Inhibitors     I personally reviewed active problem list, medication list, allergies, family history, social history, health maintenance with the patient/caregiver today.   ROS  Ten systems reviewed and is negative except as mentioned in HPI   Objective  Vitals:   09/05/22 1107  BP: 122/78  Pulse: 91  Resp: 16  SpO2: 99%  Weight: 143 lb (64.9 kg)  Height: '4\' 11"'$  (1.499 m)    Body mass index is 28.88 kg/m.  Physical Exam  Constitutional: Patient appears well-developed and well-nourished. O No distress.  HEENT: head atraumatic, normocephalic, pupils equal and reactive to light, neck supple Cardiovascular: Normal rate, regular rhythm and normal heart sounds.  No murmur heard. No BLE edema. Pulmonary/Chest: Effort normal and breath sounds normal. No respiratory distress. Abdominal: Soft.  There is no tenderness. Psychiatric: quiet, cooperative, daughter gave the history , patient answered yes and no questions  Recent Results (from the past 2160 hour(s))  Basic metabolic panel     Status: Abnormal   Collection Time: 08/16/22  2:22 PM  Result Value Ref Range   Sodium 138 135 - 145 mmol/L   Potassium 4.0 3.5 - 5.1 mmol/L   Chloride 103 98 - 111 mmol/L   CO2 25 22 - 32 mmol/L   Glucose, Bld 233 (H) 70 - 99 mg/dL    Comment: Glucose reference range applies only to samples taken after fasting for at least 8 hours.   BUN 23 8 - 23 mg/dL   Creatinine, Ser 1.22 (H) 0.44 - 1.00 mg/dL   Calcium 9.7 8.9 - 10.3 mg/dL   GFR, Estimated 47 (L) >60 mL/min    Comment: (NOTE) Calculated using the CKD-EPI Creatinine Equation (2021)    Anion gap 10 5 - 15    Comment: Performed at [facility], [address] [place],  [place] [address]  CBC     Status: Abnormal   Collection Time: 08/16/22  2:22 PM  Result Value Ref Range   WBC 6.9 4.0 - 10.5 K/uL   RBC 3.94 3.87 - 5.11 MIL/uL   Hemoglobin 11.2 (L) 12.0 - 15.0 g/dL   HCT 34.0 (L) 36.0 - 46.0 %   MCV 86.3 80.0 - 100.0 fL   MCH 28.4 26.0 - 34.0 pg   MCHC 32.9 30.0 - 36.0 g/dL   RDW 13.3 11.5 - 15.5 %   Platelets 194 150 - 400 K/uL   nRBC 0.0 0.0 - 0.2 %    Comment: Performed at [facility], [address] [place], [place] [address]  CK     Status: None   Collection Time: 08/16/22  2:22 PM  Result Value Ref Range   Total CK 76 38 - 234 U/L    Comment: Performed at [facility], [address] [place], [place] [address]  Urinalysis, Routine w reflex microscopic     Status: Abnormal   Collection Time: 08/16/22  3:25 PM  Result Value Ref Range   Color, Urine YELLOW (A) YELLOW   APPearance HAZY (A) CLEAR   Specific Gravity, Urine 1.030 1.005 - 1.030   pH 5.0 5.0 - 8.0   Glucose, UA >=500 (A) NEGATIVE mg/dL   Hgb urine dipstick NEGATIVE NEGATIVE   Bilirubin Urine NEGATIVE NEGATIVE   Ketones, ur NEGATIVE NEGATIVE mg/dL   Protein, ur NEGATIVE NEGATIVE mg/dL   Nitrite NEGATIVE NEGATIVE   Leukocytes,Ua NEGATIVE NEGATIVE   RBC / HPF 0-5 0 - 5 RBC/hpf   WBC, UA 0-5 0 -  5 WBC/hpf   Bacteria, UA NONE SEEN NONE SEEN   Squamous Epithelial / LPF NONE SEEN 0 - 5    Comment: Performed at [facility], [address] [place], [place] [address]  CBG monitoring, ED     Status: Abnormal   Collection Time: 08/16/22  4:14 PM  Result Value Ref Range   Glucose-Capillary 185 (H) 70 - 99 mg/dL    Comment: Glucose reference range applies only to samples taken after fasting for at least 8 hours.  POCT HgB A1C     Status: Abnormal   Collection Time: 09/05/22 11:08 AM  Result Value Ref Range   Hemoglobin A1C 7.4 (A) 4.0 - 5.6 %   HbA1c POC (<> result, manual entry)     HbA1c, POC (prediabetic range)     HbA1c, POC (controlled diabetic range)       PHQ2/9:    09/05/2022   11:06 AM 08/22/2022    1:09 PM 05/08/2022   10:27 AM 02/06/2022    2:47 PM 11/10/2021    2:50 PM  Depression screen PHQ 2/9  Decreased Interest 0 0 1 0 0  Down, Depressed, Hopeless 0 0 0 0 0  PHQ - 2 Score 0 0 1 0 0  Altered sleeping 0 0 0 0 0  Tired, decreased energy 0 0 0 0 0  Change in appetite 0 0 0 0 0  Feeling bad or failure about yourself  0 0 0 0 0  Trouble concentrating 0 0 0 0 0  Moving slowly or fidgety/restless 0 0 0 0 0  Suicidal thoughts 0 0 0 0 0  PHQ-9 Score 0 0 1 0 0  Difficult doing work/chores  Not difficult at all Not difficult at all  Not difficult at all    phq 9 is on medication, unable to ask , daughter gave answers patient has dementia    Fall Risk:    09/05/2022   11:06 AM 08/22/2022    1:09 PM 05/08/2022   10:27 AM 02/06/2022    2:47 PM 11/10/2021    2:49 PM  [place] in the past year? '1 1 1 1 '$ 0  Number falls in past yr: 1 1 0 1 0  Injury with Fall? 0 0 0 0 0  Risk for fall due to : Impaired balance/gait;History of fall(s) History of fall(s);Impaired mobility Impaired balance/gait Impaired balance/gait No Fall Risks  Follow up Falls prevention discussed Falls prevention discussed;Education provided;Falls evaluation completed Falls prevention discussed;Education provided Falls prevention discussed Falls prevention discussed      Functional Status Survey: Is the patient deaf or have difficulty hearing?: No Does the patient have difficulty seeing, even when wearing glasses/contacts?: No Does the patient have difficulty concentrating, remembering, or making decisions?: Yes Does the patient have difficulty walking or climbing stairs?: Yes Does the patient have difficulty dressing or bathing?: Yes Does the patient have difficulty doing errands alone such as visiting a doctor's office or shopping?: Yes    Assessment & Plan  1. Type 2 diabetes mellitus with microalbuminuria, without long-term current use of  insulin (HCC)  - POCT HgB A1C - empagliflozin (JARDIANCE) 25 MG TABS tablet; Take 1 tablet (25 mg total) by mouth daily.  Dispense: 90 tablet; Refill: 1 - losartan (COZAAR) 25 MG tablet; Take 1 tablet (25 mg total) by mouth daily.  Dispense: 90 tablet; Refill: 1 - SitaGLIPtin-MetFORMIN HCl (JANUMET XR) 50-1000 MG TB24; Take 2 tablets by mouth daily.  Dispense:  180 tablet; Refill: 1  2. Need for immunization against influenza  - Flu Vaccine QUAD High Dose(Fluad)  3. Osteopenia, unspecified location   4. Benign essential HTN  - carvedilol (COREG) 12.5 MG tablet; Take 1 tablet (12.5 mg total) by mouth 2 (two) times daily with a meal.  Dispense: 180 tablet; Refill: 1  5. Dyslipidemia  - icosapent Ethyl (VASCEPA) 1 g capsule; Take 1 capsule (1 g total) by mouth 2 (two) times daily.  Dispense: 180 capsule; Refill: 1 - rosuvastatin (CRESTOR) 20 MG tablet; Take 1 tablet (20 mg total) by mouth daily.  Dispense: 90 tablet; Refill: 1  6. Controlled gout  - allopurinol (ZYLOPRIM) 100 MG tablet; Take 1 tablet (100 mg total) by mouth 2 (two) times daily.  Dispense: 180 tablet; Refill: 1  7. Dementia with behavioral disturbance ([place])

## 2022-09-05 ENCOUNTER — Ambulatory Visit (INDEPENDENT_AMBULATORY_CARE_PROVIDER_SITE_OTHER): Payer: Medicare Other | Admitting: Family Medicine

## 2022-09-05 ENCOUNTER — Encounter: Payer: Self-pay | Admitting: Family Medicine

## 2022-09-05 VITALS — BP 122/78 | HR 91 | Resp 16 | Ht 59.0 in | Wt 143.0 lb

## 2022-09-05 DIAGNOSIS — F03918 Unspecified dementia, unspecified severity, with other behavioral disturbance: Secondary | ICD-10-CM

## 2022-09-05 DIAGNOSIS — M109 Gout, unspecified: Secondary | ICD-10-CM

## 2022-09-05 DIAGNOSIS — M8580 Other specified disorders of bone density and structure, unspecified site: Secondary | ICD-10-CM | POA: Diagnosis not present

## 2022-09-05 DIAGNOSIS — E1129 Type 2 diabetes mellitus with other diabetic kidney complication: Secondary | ICD-10-CM

## 2022-09-05 DIAGNOSIS — E785 Hyperlipidemia, unspecified: Secondary | ICD-10-CM | POA: Diagnosis not present

## 2022-09-05 DIAGNOSIS — R809 Proteinuria, unspecified: Secondary | ICD-10-CM

## 2022-09-05 DIAGNOSIS — Z23 Encounter for immunization: Secondary | ICD-10-CM | POA: Diagnosis not present

## 2022-09-05 DIAGNOSIS — I10 Essential (primary) hypertension: Secondary | ICD-10-CM

## 2022-09-05 LAB — POCT GLYCOSYLATED HEMOGLOBIN (HGB A1C): Hemoglobin A1C: 7.4 % — AB (ref 4.0–5.6)

## 2022-09-05 MED ORDER — EMPAGLIFLOZIN 25 MG PO TABS: 25.0000 mg | ORAL_TABLET | Freq: Every day | ORAL | 1 refills | Status: DC

## 2022-09-05 MED ORDER — JANUMET XR 50-1000 MG PO TB24: 2.0000 | ORAL_TABLET | Freq: Every day | ORAL | 1 refills | Status: DC

## 2022-09-05 MED ORDER — CARVEDILOL 12.5 MG PO TABS: 12.5000 mg | ORAL_TABLET | Freq: Two times a day (BID) | ORAL | 1 refills | Status: DC

## 2022-09-05 MED ORDER — ICOSAPENT ETHYL 1 G PO CAPS: 1.0000 g | ORAL_CAPSULE | Freq: Two times a day (BID) | ORAL | 1 refills | Status: DC

## 2022-09-05 MED ORDER — LOSARTAN POTASSIUM 25 MG PO TABS: 25.0000 mg | ORAL_TABLET | Freq: Every day | ORAL | 1 refills | Status: DC

## 2022-09-05 MED ORDER — ALLOPURINOL 100 MG PO TABS: 100.0000 mg | ORAL_TABLET | Freq: Two times a day (BID) | ORAL | 1 refills | Status: DC

## 2022-09-05 MED ORDER — ROSUVASTATIN CALCIUM 20 MG PO TABS: 20.0000 mg | ORAL_TABLET | Freq: Every day | ORAL | 1 refills | Status: DC

## 2022-09-05 MED ORDER — ALENDRONATE SODIUM 70 MG PO TABS: ORAL_TABLET | ORAL | 3 refills | Status: DC

## 2022-11-13 ENCOUNTER — Telehealth: Payer: Self-pay

## 2022-11-13 ENCOUNTER — Other Ambulatory Visit: Payer: Self-pay | Admitting: Family Medicine

## 2022-11-13 DIAGNOSIS — E785 Hyperlipidemia, unspecified: Secondary | ICD-10-CM

## 2022-11-13 MED ORDER — VASCEPA 1 G PO CAPS: 2.0000 g | ORAL_CAPSULE | Freq: Two times a day (BID) | ORAL | 1 refills | Status: DC

## 2022-11-15 ENCOUNTER — Encounter: Payer: Self-pay | Admitting: Internal Medicine

## 2022-11-15 ENCOUNTER — Emergency Department: Payer: 59

## 2022-11-15 ENCOUNTER — Inpatient Hospital Stay
Admission: EM | Admit: 2022-11-15 | Discharge: 2022-11-17 | DRG: 153 | Disposition: A | Discharge status: Home / Self Care | Payer: 59 | Attending: Obstetrics and Gynecology | Admitting: Obstetrics and Gynecology

## 2022-11-15 ENCOUNTER — Other Ambulatory Visit: Payer: Self-pay

## 2022-11-15 DIAGNOSIS — E785 Hyperlipidemia, unspecified: Secondary | ICD-10-CM | POA: Diagnosis present

## 2022-11-15 DIAGNOSIS — F32A Depression, unspecified: Secondary | ICD-10-CM | POA: Diagnosis present

## 2022-11-15 DIAGNOSIS — B974 Respiratory syncytial virus as the cause of diseases classified elsewhere: Secondary | ICD-10-CM | POA: Diagnosis present

## 2022-11-15 DIAGNOSIS — I129 Hypertensive chronic kidney disease with stage 1 through stage 4 chronic kidney disease, or unspecified chronic kidney disease: Secondary | ICD-10-CM | POA: Diagnosis present

## 2022-11-15 DIAGNOSIS — Z1152 Encounter for screening for COVID-19: Secondary | ICD-10-CM | POA: Diagnosis not present

## 2022-11-15 DIAGNOSIS — E1136 Type 2 diabetes mellitus with diabetic cataract: Secondary | ICD-10-CM | POA: Diagnosis not present

## 2022-11-15 DIAGNOSIS — R404 Transient alteration of awareness: Secondary | ICD-10-CM | POA: Diagnosis not present

## 2022-11-15 DIAGNOSIS — Z8249 Family history of ischemic heart disease and other diseases of the circulatory system: Secondary | ICD-10-CM

## 2022-11-15 DIAGNOSIS — M109 Gout, unspecified: Secondary | ICD-10-CM | POA: Diagnosis present

## 2022-11-15 DIAGNOSIS — Z79890 Hormone replacement therapy (postmenopausal): Secondary | ICD-10-CM

## 2022-11-15 DIAGNOSIS — Z888 Allergy status to other drugs, medicaments and biological substances status: Secondary | ICD-10-CM

## 2022-11-15 DIAGNOSIS — E1122 Type 2 diabetes mellitus with diabetic chronic kidney disease: Secondary | ICD-10-CM | POA: Diagnosis not present

## 2022-11-15 DIAGNOSIS — M810 Age-related osteoporosis without current pathological fracture: Secondary | ICD-10-CM | POA: Diagnosis present

## 2022-11-15 DIAGNOSIS — Z79899 Other long term (current) drug therapy: Secondary | ICD-10-CM

## 2022-11-15 DIAGNOSIS — F419 Anxiety disorder, unspecified: Secondary | ICD-10-CM | POA: Diagnosis present

## 2022-11-15 DIAGNOSIS — R531 Weakness: Secondary | ICD-10-CM

## 2022-11-15 DIAGNOSIS — F03918 Unspecified dementia, unspecified severity, with other behavioral disturbance: Secondary | ICD-10-CM | POA: Diagnosis not present

## 2022-11-15 DIAGNOSIS — A419 Sepsis, unspecified organism: Secondary | ICD-10-CM | POA: Diagnosis not present

## 2022-11-15 DIAGNOSIS — R440 Auditory hallucinations: Secondary | ICD-10-CM | POA: Diagnosis present

## 2022-11-15 DIAGNOSIS — R5381 Other malaise: Secondary | ICD-10-CM | POA: Diagnosis not present

## 2022-11-15 DIAGNOSIS — E11319 Type 2 diabetes mellitus with unspecified diabetic retinopathy without macular edema: Secondary | ICD-10-CM | POA: Diagnosis present

## 2022-11-15 DIAGNOSIS — Z833 Family history of diabetes mellitus: Secondary | ICD-10-CM | POA: Diagnosis not present

## 2022-11-15 DIAGNOSIS — J9811 Atelectasis: Secondary | ICD-10-CM | POA: Diagnosis present

## 2022-11-15 DIAGNOSIS — I10 Essential (primary) hypertension: Secondary | ICD-10-CM | POA: Diagnosis present

## 2022-11-15 DIAGNOSIS — Z807 Family history of other malignant neoplasms of lymphoid, hematopoietic and related tissues: Secondary | ICD-10-CM

## 2022-11-15 DIAGNOSIS — N1831 Chronic kidney disease, stage 3a: Secondary | ICD-10-CM | POA: Diagnosis not present

## 2022-11-15 DIAGNOSIS — Z85828 Personal history of other malignant neoplasm of skin: Secondary | ICD-10-CM | POA: Diagnosis not present

## 2022-11-15 DIAGNOSIS — Z6831 Body mass index (BMI) 31.0-31.9, adult: Secondary | ICD-10-CM

## 2022-11-15 DIAGNOSIS — Z8262 Family history of osteoporosis: Secondary | ICD-10-CM | POA: Diagnosis not present

## 2022-11-15 DIAGNOSIS — E669 Obesity, unspecified: Secondary | ICD-10-CM | POA: Diagnosis present

## 2022-11-15 DIAGNOSIS — Z743 Need for continuous supervision: Secondary | ICD-10-CM | POA: Diagnosis not present

## 2022-11-15 DIAGNOSIS — Z7984 Long term (current) use of oral hypoglycemic drugs: Secondary | ICD-10-CM

## 2022-11-15 DIAGNOSIS — M797 Fibromyalgia: Secondary | ICD-10-CM | POA: Diagnosis present

## 2022-11-15 DIAGNOSIS — Z803 Family history of malignant neoplasm of breast: Secondary | ICD-10-CM

## 2022-11-15 DIAGNOSIS — J069 Acute upper respiratory infection, unspecified: Principal | ICD-10-CM | POA: Diagnosis present

## 2022-11-15 DIAGNOSIS — B338 Other specified viral diseases: Secondary | ICD-10-CM | POA: Diagnosis not present

## 2022-11-15 LAB — CBC WITH DIFFERENTIAL/PLATELET
Abs Immature Granulocytes: 0.03 10*3/uL (ref 0.00–0.07)
Basophils Absolute: 0.1 10*3/uL (ref 0.0–0.1)
Basophils Relative: 1 %
Eosinophils Absolute: 0.1 10*3/uL (ref 0.0–0.5)
Eosinophils Relative: 1 %
HCT: 35.1 % — ABNORMAL LOW (ref 36.0–46.0)
Hemoglobin: 11.6 g/dL — ABNORMAL LOW (ref 12.0–15.0)
Immature Granulocytes: 0 %
Lymphocytes Relative: 25 %
Lymphs Abs: 1.7 10*3/uL (ref 0.7–4.0)
MCH: 28.8 pg (ref 26.0–34.0)
MCHC: 33 g/dL (ref 30.0–36.0)
MCV: 87.1 fL (ref 80.0–100.0)
Monocytes Absolute: 0.6 10*3/uL (ref 0.1–1.0)
Monocytes Relative: 8 %
Neutro Abs: 4.6 10*3/uL (ref 1.7–7.7)
Neutrophils Relative %: 65 %
Platelets: 167 10*3/uL (ref 150–400)
RBC: 4.03 MIL/uL (ref 3.87–5.11)
RDW: 13.8 % (ref 11.5–15.5)
WBC: 7.1 10*3/uL (ref 4.0–10.5)
nRBC: 0 % (ref 0.0–0.2)

## 2022-11-15 LAB — COMPREHENSIVE METABOLIC PANEL
ALT: 22 U/L (ref 0–44)
AST: 29 U/L (ref 15–41)
Albumin: 3.8 g/dL (ref 3.5–5.0)
Alkaline Phosphatase: 63 U/L (ref 38–126)
Anion gap: 10 (ref 5–15)
BUN: 21 mg/dL (ref 8–23)
CO2: 27 mmol/L (ref 22–32)
Calcium: 10.1 mg/dL (ref 8.9–10.3)
Chloride: 103 mmol/L (ref 98–111)
Creatinine, Ser: 1.16 mg/dL — ABNORMAL HIGH (ref 0.44–1.00)
GFR, Estimated: 50 mL/min — ABNORMAL LOW (ref >60)
Glucose, Bld: 171 mg/dL — ABNORMAL HIGH (ref 70–99)
Potassium: 4.2 mmol/L (ref 3.5–5.1)
Sodium: 140 mmol/L (ref 135–145)
Total Bilirubin: 0.6 mg/dL (ref 0.3–1.2)
Total Protein: 6.7 g/dL (ref 6.5–8.1)

## 2022-11-15 LAB — RESP PANEL BY RT-PCR (RSV, FLU A&B, COVID)  RVPGX2
Influenza A by PCR: NEGATIVE
Influenza B by PCR: NEGATIVE
Resp Syncytial Virus by PCR: POSITIVE — AB
SARS Coronavirus 2 by RT PCR: NEGATIVE

## 2022-11-15 LAB — LACTIC ACID, PLASMA
Lactic Acid, Venous: 1.6 mmol/L (ref 0.5–1.9)
Lactic Acid, Venous: 0.9 mmol/L (ref 0.5–1.9)

## 2022-11-15 LAB — GLUCOSE, CAPILLARY
Glucose-Capillary: 122 mg/dL — ABNORMAL HIGH (ref 70–99)
Glucose-Capillary: 123 mg/dL — ABNORMAL HIGH (ref 70–99)

## 2022-11-15 MED ORDER — ACETAMINOPHEN 650 MG RE SUPP: 650.0000 mg | Freq: Four times a day (QID) | RECTAL | Status: DC | PRN

## 2022-11-15 MED ORDER — ROSUVASTATIN CALCIUM 20 MG PO TABS
20.0000 mg | ORAL_TABLET | Freq: Every day | ORAL | Status: DC
  Administered 2022-11-15 – 2022-11-16 (×2): 20 mg via ORAL
  Filled 2022-11-15 (×2): qty 1

## 2022-11-15 MED ORDER — MEMANTINE HCL 5 MG PO TABS
10.0000 mg | ORAL_TABLET | Freq: Two times a day (BID) | ORAL | Status: DC
  Administered 2022-11-15 – 2022-11-17 (×4): 10 mg via ORAL
  Filled 2022-11-15 (×4): qty 2

## 2022-11-15 MED ORDER — ENOXAPARIN SODIUM 40 MG/0.4ML IJ SOSY
40.0000 mg | PREFILLED_SYRINGE | INTRAMUSCULAR | Status: DC
  Administered 2022-11-15 – 2022-11-16 (×2): 40 mg via SUBCUTANEOUS
  Filled 2022-11-15 (×2): qty 0.4

## 2022-11-15 MED ORDER — CARVEDILOL 6.25 MG PO TABS
12.5000 mg | ORAL_TABLET | Freq: Two times a day (BID) | ORAL | Status: DC
  Administered 2022-11-15 – 2022-11-17 (×4): 12.5 mg via ORAL
  Filled 2022-11-15 (×4): qty 2

## 2022-11-15 MED ORDER — QUETIAPINE FUMARATE 25 MG PO TABS
100.0000 mg | ORAL_TABLET | Freq: Three times a day (TID) | ORAL | Status: DC
  Administered 2022-11-15 – 2022-11-17 (×5): 100 mg via ORAL
  Filled 2022-11-15 (×5): qty 4

## 2022-11-15 MED ORDER — ICOSAPENT ETHYL 1 G PO CAPS
2.0000 g | ORAL_CAPSULE | Freq: Two times a day (BID) | ORAL | Status: DC
  Administered 2022-11-15 – 2022-11-17 (×4): 2 g via ORAL
  Filled 2022-11-15 (×4): qty 2

## 2022-11-15 MED ORDER — ACETAMINOPHEN 325 MG PO TABS: 650.0000 mg | ORAL_TABLET | Freq: Four times a day (QID) | ORAL | Status: DC | PRN

## 2022-11-15 MED ORDER — POLYETHYLENE GLYCOL 3350 17 G PO PACK: 17.0000 g | PACK | Freq: Every day | ORAL | Status: DC | PRN

## 2022-11-15 MED ORDER — ONDANSETRON HCL 4 MG PO TABS: 4.0000 mg | ORAL_TABLET | Freq: Four times a day (QID) | ORAL | Status: DC | PRN

## 2022-11-15 MED ORDER — LOSARTAN POTASSIUM 25 MG PO TABS
25.0000 mg | ORAL_TABLET | Freq: Every day | ORAL | Status: DC
  Administered 2022-11-16 – 2022-11-17 (×2): 25 mg via ORAL
  Filled 2022-11-15 (×2): qty 1

## 2022-11-15 MED ORDER — ONDANSETRON HCL 4 MG/2ML IJ SOLN: 4.0000 mg | Freq: Four times a day (QID) | INTRAMUSCULAR | Status: DC | PRN

## 2022-11-15 MED ORDER — INSULIN ASPART 100 UNIT/ML IJ SOLN
0.0000 [IU] | Freq: Three times a day (TID) | INTRAMUSCULAR | Status: DC
  Administered 2022-11-15: 2 [IU] via SUBCUTANEOUS
  Filled 2022-11-15 (×2): qty 1

## 2022-11-15 MED ORDER — DONEPEZIL HCL 5 MG PO TABS
10.0000 mg | ORAL_TABLET | Freq: Every day | ORAL | Status: DC
  Administered 2022-11-16 – 2022-11-17 (×2): 10 mg via ORAL
  Filled 2022-11-15 (×2): qty 2

## 2022-11-15 MED ORDER — LACTATED RINGERS IV SOLN: INTRAVENOUS | Status: AC

## 2022-11-15 MED ORDER — SERTRALINE HCL 50 MG PO TABS
50.0000 mg | ORAL_TABLET | Freq: Every evening | ORAL | Status: DC
  Administered 2022-11-15 – 2022-11-16 (×2): 50 mg via ORAL
  Filled 2022-11-15 (×2): qty 1

## 2022-11-15 MED ORDER — MELATONIN 5 MG PO TABS
5.0000 mg | ORAL_TABLET | Freq: Every day | ORAL | Status: DC
  Administered 2022-11-15 – 2022-11-16 (×2): 5 mg via ORAL
  Filled 2022-11-15 (×2): qty 1

## 2022-11-15 MED ORDER — ADULT MULTIVITAMIN W/MINERALS CH
1.0000 | ORAL_TABLET | Freq: Every day | ORAL | Status: DC
  Administered 2022-11-15 – 2022-11-17 (×3): 1 via ORAL
  Filled 2022-11-15 (×3): qty 1

## 2022-11-15 MED ORDER — SODIUM CHLORIDE 0.9 % IV BOLUS
500.0000 mL | Freq: Once | INTRAVENOUS | Status: AC
  Administered 2022-11-15: 500 mL via INTRAVENOUS

## 2022-11-15 MED ORDER — ALLOPURINOL 100 MG PO TABS
100.0000 mg | ORAL_TABLET | Freq: Two times a day (BID) | ORAL | Status: DC
  Administered 2022-11-15 – 2022-11-17 (×4): 100 mg via ORAL
  Filled 2022-11-15 (×4): qty 1

## 2022-11-15 NOTE — Assessment & Plan Note (Signed)
-  Hold home Jardiance and Janumet - SSI, moderate

## 2022-11-15 NOTE — ED Provider Notes (Signed)
[facility] Provider Note    Event Date/Time   First MD Initiated Contact with Patient 11/15/22 1259     (approximate)   History   Chief Complaint: Generalized weakness  HPI  Lindsey [name] is a 72 y.o. female with a history of hypertension diabetes CKD dementia who was brought to the ED due to worsening weakness and cough over the last 24 hours.  Has caregiver at home but not able to perform ADLs even with walker and caregiver assistance.  No falls or trauma.  No chest pain or other pain complaints.  Poor oral intake over last 24 hours.     Physical Exam   Triage Vital Signs: ED Triage Vitals  Enc Vitals Group     BP 11/15/22 1253 122/78     Pulse Rate 11/15/22 1253 (!) 110     Resp 11/15/22 1253 16     Temp 11/15/22 1253 99.7 F (37.6 C)     Temp Source 11/15/22 1253 Oral     SpO2 11/15/22 1253 94 %     Weight 11/15/22 1254 162 lb 0.6 oz (73.5 kg)     Height 11/15/22 1254 5' (1.524 m)     Head Circumference --      Peak Flow --      Pain Score 11/15/22 1254 0     Pain Loc --      Pain Edu? --      Excl. in [place]? --     Most recent vital signs: Vitals:   11/15/22 1253 11/15/22 1330  BP: 122/78 (!) 147/83  Pulse: (!) 110 (!) 107  Resp: 16 (!) 25  Temp: 99.7 F (37.6 C)   SpO2: 94% 95%    General: Awake, no distress.  CV:  Good peripheral perfusion.  Tachycardia heart rate 110 Resp:  Normal effort.  Clear to auscultation bilaterally Abd:  No distention.  Soft nontender Other:  Dry mucous membranes.  No lower extremity edema   ED Results / Procedures / Treatments   Labs (all labs ordered are listed, but only abnormal results are displayed) Labs Reviewed  RESP PANEL BY RT-PCR (RSV, FLU A&B, COVID)  RVPGX2 - Abnormal; Notable for the following components:      Result Value   Resp Syncytial Virus by PCR POSITIVE (*)    All other components within normal limits  COMPREHENSIVE METABOLIC PANEL - Abnormal; Notable for the following  components:   Glucose, Bld 171 (*)    Creatinine, Ser 1.16 (*)    GFR, Estimated 50 (*)    All other components within normal limits  CBC WITH DIFFERENTIAL/PLATELET - Abnormal; Notable for the following components:   Hemoglobin 11.6 (*)    HCT 35.1 (*)    All other components within normal limits  CULTURE, BLOOD (ROUTINE X 2)  CULTURE, BLOOD (ROUTINE X 2)  LACTIC ACID, PLASMA  LACTIC ACID, PLASMA  URINALYSIS, W/ REFLEX TO CULTURE (INFECTION SUSPECTED)     EKG Interpreted by me Sinus tachycardia rate 107.  Normal axis, normal intervals.  Poor R wave progression.  No acute ischemic changes.   RADIOLOGY CT head interpreted by me, appears unremarkable.  Radiology report reviewed.  Chest x-ray unremarkable.   PROCEDURES:  Procedures   MEDICATIONS ORDERED IN ED: Medications  sodium chloride 0.9 % bolus 500 mL (has no administration in time range)     IMPRESSION / MDM / ASSESSMENT AND PLAN / ED COURSE  I reviewed the triage vital signs  and the nursing notes.  DDx: Viral illness, AKI, UTI, dehydration, anemia, intracranial hemorrhage, pneumonia, pleural effusion  Patient's presentation is most consistent with acute presentation with potential threat to life or bodily function.  Patient presents with generalized weakness and cough.  Initial lactate is normal, not septic.  Viral panel positive for RSV.  Patient given IV fluids for hydration.  Due to her acute functional decline and inability for ADLs to be maintained in her home environment in the setting of this illness, case discussed with the hospitalist for further evaluation.       FINAL CLINICAL IMPRESSION(S) / ED DIAGNOSES   Final diagnoses:  RSV infection  Generalized weakness     Rx / DC Orders   ED Discharge Orders     None        Note:  This document was prepared using Dragon voice recognition software and may include unintentional dictation errors.   Carrie Mew, MD 11/15/22 [phone]

## 2022-11-15 NOTE — Assessment & Plan Note (Signed)
Renal function currently at baseline.

## 2022-11-15 NOTE — Assessment & Plan Note (Signed)
Patient presenting with several day history of productive cough, generalized weakness, malaise, nausea and poor appetite in the setting of RSV infection.  No evidence of focal opacities on chest x-ray, however she does have some atelectasis in the bases.  She is not hypoxic and denies shortness of breath.  - IV maintenance fluids - Tylenol as needed for fever - Zofran as needed for nausea - PT/OT

## 2022-11-15 NOTE — Assessment & Plan Note (Signed)
At high risk for delirium.  - Continue home memantine, Seroquel, sertraline - Delirium precautions

## 2022-11-15 NOTE — Assessment & Plan Note (Signed)
-  Continue home antihypertensives 

## 2022-11-15 NOTE — ED Triage Notes (Addendum)
Pt arrives via ems from home, daughter is legal guardian and her caregiver called ems, pt is having weakness and coughing up blood, was good last pm, pt unable to stand and use walker today, a little more altered today per the caregiver.  108 p 127/96 Cbg 208 99.2 A   Pt is noted to have congested sounding cough upon arrival Pt has a hx of dementia

## 2022-11-15 NOTE — H&P (Signed)
History and Physical    Patient: Lindsey [name] CBS:[id number] DOB: Jun 07, 1951 DOA: 11/15/2022 DOS: the patient was seen and examined on 11/15/2022 PCP: Steele Sizer, MD  Patient coming from: Home  Chief Complaint: Generalized weakness  HPI: Lindsey [name] is a 72 y.o. female with medical history significant of dementia with auditory hallucinations, type 2 diabetes, hypertension, hyperlipidemia, depression, osteopenia who presents to the ED due to generalized weakness.  History obtained from both patient and daughter due to patient's underlying dementia.  Lindsey [name] states that for the last 3 to 4 days, she has been experiencing a productive cough.  In addition, she endorses generalized weakness, malaise, poor appetite, nausea but denies any shortness of breath, chest pain, abdominal pain, vomiting or diarrhea.  Patient's daughter at bedside, [name], agrees with the given history.  Generalized weakness began today but family denies noticing any focal weakness.  [name] is unsure if patient has had any fevers.  They have also noticed that she has seemed more altered with potential auditory hallucinations since cough developed.  ED Course:  On arrival to the ED, patient was normotensive at 122/78 with heart rate of 106.  She was saturating at 94% on room air.  She was afebrile at 99.7.  Initial workup remarkable for WBC of 7.1, hemoglobin of 11.6, glucose of 171, creatinine of 1.16, and GFR 50.  Lactic acid within normal limits.  RSV PCR positive.  Chest x-ray notable for basilar atelectasis only.  CT of the head with no acute intracranial abnormalities.  Due to generalized weakness with unsafe disposition, [facility] contacted for admission.  Review of Systems: unable to review all systems due to the inability of the patient to answer questions. Past Medical History:  Diagnosis Date   Acute gout    Allergy ace inhibitors   Anemia of chronic disease    Anxiety    Basal cell carcinoma    Cataract     Depression, major, recurrent, mild ([place])    Diabetes mellitus without complication ([place])    Fibromyalgia syndrome    Hyperlipidemia    Hypertension    Osteoporosis    Retinopathy    Stage 3a chronic kidney disease ([place]) 05/08/2022   Past Surgical History:  Procedure Laterality Date   CESAREAN SECTION     TONSILECTOMY, ADENOIDECTOMY, BILATERAL MYRINGOTOMY AND TUBES     TUBAL LIGATION     Social History:  reports that she has never smoked. She has never used smokeless tobacco. She reports that she does not drink alcohol and does not use drugs.  Allergies  Allergen Reactions   Ace Inhibitors     Family History  Problem Relation Age of Onset   Hypertension Mother    Osteoporosis Mother    Cancer Mother        brain tumor   Hodgkin's lymphoma Father    Hypertension Sister    Cancer Sister        unknown   Hypertension Sister    Diabetes Sister    Cancer Sister 3       breast   Breast cancer Sister 37   Cancer Maternal Uncle     Prior to Admission medications   Medication Sig Start Date End Date Taking? Authorizing Provider  VASCEPA 1 g capsule Take 2 capsules (2 g total) by mouth 2 (two) times daily. 11/13/22   Steele Sizer, MD  allopurinol (ZYLOPRIM) 100 MG tablet Take 1 tablet (100 mg total) by mouth 2 (two) times daily. 09/05/22  Steele Sizer, MD  carvedilol (COREG) 12.5 MG tablet Take 1 tablet (12.5 mg total) by mouth 2 (two) times daily with a meal. 09/05/22   Ancil Boozer, Drue Stager, MD  donepezil (ARICEPT) 10 MG tablet Take 1 tablet (10 mg total) by mouth at bedtime. Patient taking differently: Take 10 mg by mouth daily. In the morning 07/04/18   Steele Sizer, MD  empagliflozin (JARDIANCE) 25 MG TABS tablet Take 1 tablet (25 mg total) by mouth daily. 09/05/22   Steele Sizer, MD  GLUCOSE BLOOD VI  05/26/07   [provider]  losartan (COZAAR) 25 MG tablet Take 1 tablet (25 mg total) by mouth daily. 09/05/22   Steele Sizer, MD  Melatonin 5 MG CAPS  Take 5 mg by mouth at bedtime.  07/13/16   [provider]  memantine (NAMENDA) 10 MG tablet Take 10 mg by mouth 2 (two) times daily. 06/14/20   [provider]  MULTIPLE VITAMIN PO Take 1 tablet by mouth daily. 12/28/10   [provider]  QUEtiapine (SEROQUEL) 100 MG tablet Take 1 tablet by mouth in the morning, at noon, and at bedtime. Taking '100mg'$  am, afternoon, and pm 07/15/19   Anabel Bene, MD  rosuvastatin (CRESTOR) 20 MG tablet Take 1 tablet (20 mg total) by mouth daily. 09/05/22   Steele Sizer, MD  sertraline (ZOLOFT) 50 MG tablet Take 50 mg by mouth every evening.    [provider]  SitaGLIPtin-MetFORMIN HCl (JANUMET XR) 50-1000 MG TB24 Take 2 tablets by mouth daily. 09/05/22   Steele Sizer, MD  VITAMIN D PO Take 1,000 mg by mouth daily.     [provider]    Physical Exam: Vitals:   11/15/22 1253 11/15/22 1254 11/15/22 1330 11/15/22 1500  BP: 122/78  (!) 147/83 137/74  Pulse: (!) 110  (!) 107 (!) 110  Resp: 16  (!) 25 (!) 21  Temp: 99.7 F (37.6 C)     TempSrc: Oral     SpO2: 94%  95% 93%  Weight:  73.5 kg    Height:  5' (1.524 m)     Physical Exam Vitals and nursing note reviewed.  Constitutional:      General: She is not in acute distress.    Appearance: She is obese. She is not toxic-appearing.  HENT:     Head: Normocephalic and atraumatic.     Mouth/Throat:     Mouth: Mucous membranes are dry.     Pharynx: Oropharynx is clear.  Eyes:     Conjunctiva/sclera: Conjunctivae normal.     Pupils: Pupils are equal, round, and reactive to light.  Cardiovascular:     Rate and Rhythm: Regular rhythm. Tachycardia present.     Heart sounds: No murmur heard.    No gallop.  Pulmonary:     Effort: Pulmonary effort is normal. No respiratory distress.     Breath sounds: Decreased breath sounds (Diminished breath sounds in the bases) present. No wheezing, rhonchi or rales.  Abdominal:     General: Bowel sounds are normal.      Palpations: Abdomen is soft.  Musculoskeletal:     Right lower leg: No edema.     Left lower leg: No edema.  Skin:    General: Skin is warm and dry.  Neurological:     Mental Status: She is alert.     Comments:  Patient is alert and oriented to self and situation.  No focal weakness.  No tremor.  Psychiatric:  Mood and Affect: Mood normal.        Behavior: Behavior normal.    Data Reviewed: CBC with WBC of 7.1, hemoglobin of 11.6, platelets of 167 CMP with sodium of 140, potassium 4.2, bicarb 27, glucose 171, BUN 21, creatinine 1.16, anion gap 10, AST 29, ALT 22 and GFR 58 Lactic acid within normal limits at 1.6 RSV PCR positive.  COVID-19 influenza PCR negative.  EKG personally reviewed.  Sinus rhythm with rate of 107.  Left axis deviation, however no acute ST or T wave changes concerning for acute ischemia.  CT Head Wo Contrast  Result Date: 11/15/2022 CLINICAL DATA:  Provided history: Mental status change, unknown cause. EXAM: CT HEAD WITHOUT CONTRAST TECHNIQUE: Contiguous axial images were obtained from the base of the skull through the vertex without intravenous contrast. RADIATION DOSE REDUCTION: This exam was performed according to the departmental dose-optimization program which includes automated exposure control, adjustment of the mA and/or kV according to patient size and/or use of iterative reconstruction technique. COMPARISON:  Prior head CT examinations 08/16/2022 and earlier. FINDINGS: Mildly motion degraded exam. Brain: Generalized cerebral atrophy. Mild patchy and ill-defined hypoattenuation within the cerebral white matter, nonspecific but compatible with chronic small vessel disease. There is no acute intracranial hemorrhage. No demarcated cortical infarct. No extra-axial fluid collection. No evidence of an intracranial mass. No midline shift. Vascular: No hyperdense vessel. Atherosclerotic calcifications. Skull: No fracture or aggressive osseous lesion.  Sinuses/Orbits: No mass or acute finding within the imaged orbits. Minimal mucosal thickening within the left maxillary sinus at the imaged levels. IMPRESSION: 1. Mildly motion degraded exam. 2. No evidence of acute intracranial abnormality. 3. Parenchymal atrophy and chronic small vessel disease. Electronically Signed   By: Kellie Simmering D.O.   On: 11/15/2022 14:05   DG Chest Port 1 View  Result Date: 11/15/2022 CLINICAL DATA:  Sepsis. EXAM: PORTABLE CHEST 1 VIEW COMPARISON:  August 16, 2022. FINDINGS: Stable cardiomediastinal silhouette. Minimal bibasilar subsegmental atelectasis. Bony thorax is unremarkable. IMPRESSION: Minimal bibasilar subsegmental atelectasis. Electronically Signed   By: Marijo Conception M.D.   On: 11/15/2022 13:14    There are no new results to review at this time.  Assessment and Plan:  * RSV infection Patient presenting with several day history of productive cough, generalized weakness, malaise, nausea and poor appetite in the setting of RSV infection.  No evidence of focal opacities on chest x-ray, however she does have some atelectasis in the bases.  She is not hypoxic and denies shortness of breath.  - IV maintenance fluids - Tylenol as needed for fever - Zofran as needed for nausea - PT/OT  Stage 3a chronic kidney disease ([place]) Renal function currently at baseline.  Type 2 diabetes mellitus with diabetic cataract ([place]) - Hold home Jardiance and Janumet - SSI, moderate  Dementia with behavioral disturbance ([place]) At high risk for delirium.  - Continue home memantine, Seroquel, sertraline - Delirium precautions  Benign essential HTN - Continue home antihypertensives  Advance Care Planning:   Code Status: Full Code verified by patient's daughter [name], who is her healthcare power of attorney and legal guardian.  Consults: None  Family Communication: Patient's daughter [name] updated at bedside  Severity of Illness: The appropriate patient status  for this patient is OBSERVATION. Observation status is judged to be reasonable and necessary in order to provide the required intensity of service to ensure the patient's safety. The patient's presenting symptoms, physical exam findings, and initial radiographic and laboratory data in the context  of their medical condition is felt to place them at decreased risk for further clinical deterioration. Furthermore, it is anticipated that the patient will be medically stable for discharge from the hospital within 2 midnights of admission.   Author: Jose Persia, MD 11/15/2022 3:42 PM  For on call review [email or web address].

## 2022-11-16 DIAGNOSIS — B338 Other specified viral diseases: Secondary | ICD-10-CM | POA: Diagnosis not present

## 2022-11-16 LAB — CBC WITH DIFFERENTIAL/PLATELET
Abs Immature Granulocytes: 0.02 10*3/uL (ref 0.00–0.07)
Basophils Absolute: 0.1 10*3/uL (ref 0.0–0.1)
Basophils Relative: 1 %
Eosinophils Absolute: 0.2 10*3/uL (ref 0.0–0.5)
Eosinophils Relative: 2 %
HCT: 32.2 % — ABNORMAL LOW (ref 36.0–46.0)
Hemoglobin: 10.5 g/dL — ABNORMAL LOW (ref 12.0–15.0)
Immature Granulocytes: 0 %
Lymphocytes Relative: 21 %
Lymphs Abs: 1.6 10*3/uL (ref 0.7–4.0)
MCH: 28.5 pg (ref 26.0–34.0)
MCHC: 32.6 g/dL (ref 30.0–36.0)
MCV: 87.3 fL (ref 80.0–100.0)
Monocytes Absolute: 0.6 10*3/uL (ref 0.1–1.0)
Monocytes Relative: 8 %
Neutro Abs: 5.1 10*3/uL (ref 1.7–7.7)
Neutrophils Relative %: 68 %
Platelets: 148 10*3/uL — ABNORMAL LOW (ref 150–400)
RBC: 3.69 MIL/uL — ABNORMAL LOW (ref 3.87–5.11)
RDW: 13.7 % (ref 11.5–15.5)
WBC: 7.6 10*3/uL (ref 4.0–10.5)
nRBC: 0 % (ref 0.0–0.2)

## 2022-11-16 LAB — BASIC METABOLIC PANEL
Anion gap: 8 (ref 5–15)
BUN: 18 mg/dL (ref 8–23)
CO2: 27 mmol/L (ref 22–32)
Calcium: 9.3 mg/dL (ref 8.9–10.3)
Chloride: 102 mmol/L (ref 98–111)
Creatinine, Ser: 0.93 mg/dL (ref 0.44–1.00)
GFR, Estimated: >60 mL/min (ref >60)
Glucose, Bld: 114 mg/dL — ABNORMAL HIGH (ref 70–99)
Potassium: 3.6 mmol/L (ref 3.5–5.1)
Sodium: 137 mmol/L (ref 135–145)

## 2022-11-16 LAB — GLUCOSE, CAPILLARY
Glucose-Capillary: 102 mg/dL — ABNORMAL HIGH (ref 70–99)
Glucose-Capillary: 102 mg/dL — ABNORMAL HIGH (ref 70–99)
Glucose-Capillary: 107 mg/dL — ABNORMAL HIGH (ref 70–99)

## 2022-11-16 MED ORDER — LINAGLIPTIN 5 MG PO TABS
5.0000 mg | ORAL_TABLET | Freq: Every day | ORAL | Status: DC
  Administered 2022-11-16 – 2022-11-17 (×2): 5 mg via ORAL
  Filled 2022-11-16 (×2): qty 1

## 2022-11-16 MED ORDER — SITAGLIP PHOS-METFORMIN HCL ER 50-1000 MG PO TB24: 1.0000 | ORAL_TABLET | Freq: Two times a day (BID) | ORAL | Status: DC

## 2022-11-16 MED ORDER — METFORMIN HCL ER 500 MG PO TB24
1000.0000 mg | ORAL_TABLET | Freq: Two times a day (BID) | ORAL | Status: DC
  Administered 2022-11-16 – 2022-11-17 (×2): 1000 mg via ORAL
  Filled 2022-11-16 (×3): qty 2

## 2022-11-16 NOTE — Progress Notes (Signed)
PROGRESS NOTE    Lindsey [name]  JKK:[id number] DOB: 10-Nov-1950 DOA: 11/15/2022 PCP: Steele Sizer, MD     Brief Narrative:   From admission h and p Lindsey [name] is a 72 y.o. female with medical history significant of dementia with auditory hallucinations, type 2 diabetes, hypertension, hyperlipidemia, depression, osteopenia who presents to the ED due to generalized weakness.  History obtained from both patient and daughter due to patient's underlying dementia.   Lindsey [name] states that for the last 3 to 4 days, she has been experiencing a productive cough.  In addition, she endorses generalized weakness, malaise, poor appetite, nausea but denies any shortness of breath, chest pain, abdominal pain, vomiting or diarrhea.  Patient's daughter at bedside, [name], agrees with the given history.  Generalized weakness began today but family denies noticing any focal weakness.  [name] is unsure if patient has had any fevers.  They have also noticed that she has seemed more altered with potential auditory hallucinations since cough developed.   Assessment & Plan:   Principal Problem:   RSV infection Active Problems:   Benign essential HTN   Dementia with behavioral disturbance ([place])   Type 2 diabetes mellitus with diabetic cataract ([place])  # RSV infection Several days cough and weakness. No hypoxia or abnormal findings on CXR. Flu and covid negative. Care is supportive - monitor  # Debility Likely 2/2 RSV infection on baseline dementia. Lives at home. PT advising HH but OT advising skilled nursing, will ask PT to re-eval tomorrow. Neuro exam is non-focal and CT of head nothing acute. - PT/OT consults   # Dementia with history of behavioral disturbance No sig behavioral disturbance here - cont home memantine, seroquel, sertraline  # HTN BP control3led - cont home coreg, losartan, rosuvastatin  # DM Here glucose appropriate - daily fasting glucose - resume home  sitagliptin/metformin, holding home empagliflozin as not formulary  # Hx gout - cont home allopurinol    DVT prophylaxis: lovenox Code Status: full Family Communication: son updated telephonically 2/2  Level of care: Med-Surg Status is: Observation    Consultants:  none  Procedures: none  Antimicrobials:  none    Subjective: No complaints  Objective: Vitals:   11/16/22 0054 11/16/22 0525 11/16/22 0846 11/16/22 1205  BP: 129/75 135/77 (!) 144/80 (!) 146/74  Pulse: (!) 109 (!) 106 96 94  Resp: '19 18 20 20  '$ Temp: (!) 97.4 F (36.3 C) 98.5 F (36.9 C) 98 F (36.7 C) 98.7 F (37.1 C)  TempSrc:  Oral    SpO2: 94% 97% 94% 94%  Weight:      Height:        Intake/Output Summary (Last 24 hours) at 11/16/2022 1558 Last data filed at 11/16/2022 0500 Gross per 24 hour  Intake 1556.11 ml  Output --  Net 1556.11 ml   Filed Weights   11/15/22 1254  Weight: 73.5 kg    Examination:  General exam: Appears calm and comfortable  Respiratory system: Clear to auscultation. Respiratory effort normal. Cardiovascular system: S1 & S2 heard, RRR. No JVD, murmurs, rubs, gallops or clicks. No pedal edema. Gastrointestinal system: Abdomen is nondistended, soft and nontender. No organomegaly or masses felt. Normal bowel sounds heard. Central nervous system: Alert , confused, equal strength Extremities: warm Skin: No rashes, lesions or ulcers Psychiatry: confused   Data Reviewed: I have personally reviewed following labs and imaging studies  CBC: Recent Labs  Lab 11/15/22 1300 11/16/22 0431  WBC 7.1 7.6  NEUTROABS 4.6  5.1  HGB 11.6* 10.5*  HCT 35.1* 32.2*  MCV 87.1 87.3  PLT 167 007*   Basic Metabolic Panel: Recent Labs  Lab 11/15/22 1300 11/16/22 0431  NA 140 137  K 4.2 3.6  CL 103 102  CO2 27 27  GLUCOSE 171* 114*  BUN 21 18  CREATININE 1.16* 0.93  CALCIUM 10.1 9.3   GFR: Estimated Creatinine Clearance: 48.2 mL/min (by C-G formula based on SCr of 0.93  mg/dL). Liver Function Tests: Recent Labs  Lab 11/15/22 1300  AST 29  ALT 22  ALKPHOS 63  BILITOT 0.6  PROT 6.7  ALBUMIN 3.8   No results for input(s): "LIPASE", "AMYLASE" in the last 168 hours. No results for input(s): "AMMONIA" in the last 168 hours. Coagulation Profile: No results for input(s): "INR", "PROTIME" in the last 168 hours. Cardiac Enzymes: No results for input(s): "CKTOTAL", "CKMB", "CKMBINDEX", "TROPONINI" in the last 168 hours. BNP (last 3 results) No results for input(s): "PROBNP" in the last 8760 hours. HbA1C: No results for input(s): "HGBA1C" in the last 72 hours. CBG: Recent Labs  Lab 11/15/22 1658 11/15/22 2317 11/16/22 0848 11/16/22 1206  GLUCAP 122* 123* 102* 102*   Lipid Profile: No results for input(s): "CHOL", "HDL", "LDLCALC", "TRIG", "CHOLHDL", "LDLDIRECT" in the last 72 hours. Thyroid Function Tests: No results for input(s): "TSH", "T4TOTAL", "FREET4", "T3FREE", "THYROIDAB" in the last 72 hours. Anemia Panel: No results for input(s): "VITAMINB12", "FOLATE", "FERRITIN", "TIBC", "IRON", "RETICCTPCT" in the last 72 hours. Urine analysis:    Component Value Date/Time   COLORURINE YELLOW (A) 08/16/2022 1525   APPEARANCEUR HAZY (A) 08/16/2022 1525   LABSPEC 1.030 08/16/2022 1525   PHURINE 5.0 08/16/2022 1525   GLUCOSEU >=500 (A) 08/16/2022 1525   HGBUR NEGATIVE 08/16/2022 1525   BILIRUBINUR NEGATIVE 08/16/2022 1525   BILIRUBINUR normal 11/13/2016 1514   KETONESUR NEGATIVE 08/16/2022 1525   PROTEINUR NEGATIVE 08/16/2022 1525   UROBILINOGEN 1.0 11/13/2016 1514   NITRITE NEGATIVE 08/16/2022 1525   LEUKOCYTESUR NEGATIVE 08/16/2022 1525   Sepsis Labs: '@LABRCNTIP'$ (procalcitonin:4,lacticidven:4)  ) Recent Results (from the past 240 hour(s))  Blood Culture (routine x 2)     Status: None (Preliminary result)   Collection Time: 11/15/22 12:58 PM   Specimen: BLOOD  Result Value Ref Range Status   Specimen Description BLOOD BLOOD RIGHT FOREARM   Final   Special Requests   Final    BOTTLES DRAWN AEROBIC AND ANAEROBIC Blood Culture adequate volume   Culture   Final    NO GROWTH < 24 HOURS Performed at [facility], [address], [place], [place] [address]    Report Status PENDING  Incomplete  Resp panel by RT-PCR (RSV, Flu A&B, Covid) Anterior Nasal Swab     Status: Abnormal   Collection Time: 11/15/22  1:01 PM   Specimen: Anterior Nasal Swab  Result Value Ref Range Status   SARS Coronavirus 2 by RT PCR NEGATIVE NEGATIVE Final    Comment: (NOTE) SARS-CoV-2 target nucleic acids are NOT DETECTED.  The SARS-CoV-2 RNA is generally detectable in upper respiratory specimens during the acute phase of infection. The lowest concentration of SARS-CoV-2 viral copies this assay can detect is 138 copies/mL. A negative result does not preclude SARS-Cov-2 infection and should not be used as the sole basis for treatment or other patient management decisions. A negative result may occur with  improper specimen collection/handling, submission of specimen other than nasopharyngeal swab, presence of viral mutation(s) within the areas targeted by this assay, and inadequate number  of viral copies(<138 copies/mL). A negative result must be combined with clinical observations, patient history, and epidemiological information. The expected result is Negative.  Fact Sheet for Patients:  EntrepreneurPulse.com.au  Fact Sheet for Healthcare Providers:  IncredibleEmployment.be  This test is no t yet approved or cleared by the [place] FDA and  has been authorized for detection and/or diagnosis of SARS-CoV-2 by FDA under an Emergency Use Authorization (EUA). This EUA will remain  in effect (meaning this test can be used) for the duration of the COVID-19 declaration under Section 564(b)(1) of the Act, 21 U.S.C.section 360bbb-3(b)(1), unless the authorization is terminated  or revoked sooner.        Influenza A by PCR NEGATIVE NEGATIVE Final   Influenza B by PCR NEGATIVE NEGATIVE Final    Comment: (NOTE) The Xpert Xpress SARS-CoV-2/FLU/RSV plus assay is intended as an aid in the diagnosis of influenza from Nasopharyngeal swab specimens and should not be used as a sole basis for treatment. Nasal washings and aspirates are unacceptable for Xpert Xpress SARS-CoV-2/FLU/RSV testing.  Fact Sheet for Patients: EntrepreneurPulse.com.au  Fact Sheet for Healthcare Providers: IncredibleEmployment.be  This test is not yet approved or cleared by the [place] FDA and has been authorized for detection and/or diagnosis of SARS-CoV-2 by FDA under an Emergency Use Authorization (EUA). This EUA will remain in effect (meaning this test can be used) for the duration of the COVID-19 declaration under Section 564(b)(1) of the Act, 21 U.S.C. section 360bbb-3(b)(1), unless the authorization is terminated or revoked.     Resp Syncytial Virus by PCR POSITIVE (A) NEGATIVE Final    Comment: (NOTE) Fact Sheet for Patients: EntrepreneurPulse.com.au  Fact Sheet for Healthcare Providers: IncredibleEmployment.be  This test is not yet approved or cleared by the [place] FDA and has been authorized for detection and/or diagnosis of SARS-CoV-2 by FDA under an Emergency Use Authorization (EUA). This EUA will remain in effect (meaning this test can be used) for the duration of the COVID-19 declaration under Section 564(b)(1) of the Act, 21 U.S.C. section 360bbb-3(b)(1), unless the authorization is terminated or revoked.  Performed at [facility], [address] [place], [place] [address]   Blood Culture (routine x 2)     Status: None (Preliminary result)   Collection Time: 11/15/22  1:05 PM   Specimen: BLOOD  Result Value Ref Range Status   Specimen Description BLOOD BLOOD LEFT HAND  Final   Special Requests    Final    BOTTLES DRAWN AEROBIC AND ANAEROBIC Blood Culture adequate volume   Culture   Final    NO GROWTH < 24 HOURS Performed at [facility], [address], [place], [place] [address]    Report Status PENDING  Incomplete         Radiology Studies: CT Head Wo Contrast  Result Date: 11/15/2022 CLINICAL DATA:  Provided history: Mental status change, unknown cause. EXAM: CT HEAD WITHOUT CONTRAST TECHNIQUE: Contiguous axial images were obtained from the base of the skull through the vertex without intravenous contrast. RADIATION DOSE REDUCTION: This exam was performed according to the departmental dose-optimization program which includes automated exposure control, adjustment of the mA and/or kV according to patient size and/or use of iterative reconstruction technique. COMPARISON:  Prior head CT examinations 08/16/2022 and earlier. FINDINGS: Mildly motion degraded exam. Brain: Generalized cerebral atrophy. Mild patchy and ill-defined hypoattenuation within the cerebral white matter, nonspecific but compatible with chronic small vessel disease. There is no acute intracranial hemorrhage. No demarcated cortical infarct.  No extra-axial fluid collection. No evidence of an intracranial mass. No midline shift. Vascular: No hyperdense vessel. Atherosclerotic calcifications. Skull: No fracture or aggressive osseous lesion. Sinuses/Orbits: No mass or acute finding within the imaged orbits. Minimal mucosal thickening within the left maxillary sinus at the imaged levels. IMPRESSION: 1. Mildly motion degraded exam. 2. No evidence of acute intracranial abnormality. 3. Parenchymal atrophy and chronic small vessel disease. Electronically Signed   By: Kellie Simmering D.O.   On: 11/15/2022 14:05   DG Chest Port 1 View  Result Date: 11/15/2022 CLINICAL DATA:  Sepsis. EXAM: PORTABLE CHEST 1 VIEW COMPARISON:  August 16, 2022. FINDINGS: Stable cardiomediastinal silhouette. Minimal bibasilar subsegmental  atelectasis. Bony thorax is unremarkable. IMPRESSION: Minimal bibasilar subsegmental atelectasis. Electronically Signed   By: Marijo Conception M.D.   On: 11/15/2022 13:14        Scheduled Meds:  allopurinol  100 mg Oral BID   carvedilol  12.5 mg Oral BID WC   donepezil  10 mg Oral Daily   enoxaparin (LOVENOX) injection  40 mg Subcutaneous Q24H   icosapent Ethyl  2 g Oral BID   insulin aspart  0-15 Units Subcutaneous TID WC   losartan  25 mg Oral Daily   melatonin  5 mg Oral QHS   memantine  10 mg Oral BID   multivitamin with minerals  1 tablet Oral Daily   QUEtiapine  100 mg Oral TID   rosuvastatin  20 mg Oral Q2200   sertraline  50 mg Oral QPM   Continuous Infusions:   LOS: 0 days     Desma Maxim, MD [facility]   If 7PM-7AM, please contact night-coverage [email or web address] Password [facility] 11/16/2022, 3:58 PM

## 2022-11-16 NOTE — Progress Notes (Signed)
HH provided by [facility].  Contact at d/c.

## 2022-11-16 NOTE — Evaluation (Signed)
Physical Therapy Evaluation Patient Details Name: Lindsey [name] MRN: [id number] DOB: 01-Apr-1951 Today's Date: 11/16/2022  History of Present Illness  Lindsey [name] is a 68yoF who [facility] c weakness (unable to stand), coughing with blood, AMS. PMH: dementia (DTR is legal guardian), DM2, HTN, HLD, depression, osteopenia. Pt admitted with RSV infection. Pt here in ED in November 2023 after fall found down on floor x2 days.  Clinical Impression  Pt in bed, breakfast presented, but appears largely untouched despite evidence of being fully set up for patient. Due to baseline cogntiive impairment, pt unable to reliably answer any questioning, but does follow simple cues for tasks part of the time. Pt says she feels good, is able to come to EOB with minA. Her strength improvement is seen best in her low to moderate effort ability to rise to standing using a walker. Pt remains up for ~2 minutes, while be is straightened and pt is cued for sidestepping at beside. Pt remains fatigued and wants to get back to bed, unable to motivate to attempt some AMB at this time, but I suspect she could easily make it into BR with staff assist and RW. Pt assisted back to bed, recliner position with meal presented again, however pt is asleep before linen is applied. Will continue to follow.        Recommendations for follow up therapy are one component of a multi-disciplinary discharge planning process, led by the attending physician.  Recommendations may be updated based on patient status, additional functional criteria and insurance authorization.  Follow Up Recommendations Home health PT      Assistance Recommended at Discharge Frequent or constant Supervision/Assistance  Patient can return home with the following  Help with stairs or ramp for entrance;Assist for transportation;Assistance with cooking/housework    Equipment Recommendations None recommended by PT  Recommendations for Other Services        Functional Status Assessment Patient has had a recent decline in their functional status and demonstrates the ability to make significant improvements in function in a reasonable and predictable amount of time.     Precautions / Restrictions Precautions Precautions: Fall Restrictions Weight Bearing Restrictions: No      Mobility  Bed Mobility Overal bed mobility: Needs Assistance Bed Mobility: Supine to Sit, Sit to Supine     Supine to sit: Min assist Sit to supine: Min assist   General bed mobility comments: strength and power fairly good, but malaise still limting to continuous efforts    Transfers Overall transfer level: Needs assistance Equipment used: Rolling walker (2 wheels) Transfers: Sit to/from Stand Sit to Stand: Supervision           General transfer comment: stands up with moderate effort using RW in a safe capacity, maintains standing for several minutes    Ambulation/Gait Ambulation/Gait assistance:  (difficulty following cues for side stepping at bedside, not interested in AMB with author, difficult to cues this tax due to baseline cognitive impairment)                Stairs            Wheelchair Mobility    Modified Rankin (Stroke Patients Only)       Balance Overall balance assessment: Modified Independent, History of Falls  Pertinent Vitals/Pain Pain Assessment Pain Assessment: No/denies pain    Home Living Family/patient expects to be discharged to:: Private residence   Available Help at Discharge: Personal care attendant;Family                    Prior Function Prior Level of Function : Patient poor historian/Family not available                     Hand Dominance        Extremity/Trunk Assessment                Communication      Cognition Arousal/Alertness:  (drowsy, somnolent)   Overall Cognitive Status: History of cognitive  impairments - at baseline Area of Impairment: Orientation, Following commands                 Orientation Level: Disoriented to, Place, Time, Situation     Following Commands: Follows one step commands inconsistently                General Comments      Exercises     Assessment/Plan    PT Assessment Patient needs continued PT services  PT Problem List Decreased strength;Decreased range of motion;Decreased activity tolerance;Decreased balance;Decreased mobility;Decreased cognition;Decreased knowledge of use of DME;Decreased safety awareness;Decreased knowledge of precautions       PT Treatment Interventions DME instruction;Neuromuscular re-education;Cognitive remediation;Gait training;Stair training;Patient/family education;Functional mobility training;Therapeutic activities;Therapeutic exercise;Balance training    PT Goals (Current goals can be found in the Care Plan section)  Acute Rehab PT Goals PT Goal Formulation: Patient unable to participate in goal setting    Frequency Min 2X/week     Co-evaluation               AM-PAC PT "6 Clicks" Mobility  Outcome Measure Help needed turning from your back to your side while in a flat bed without using bedrails?: A Lot Help needed moving from lying on your back to sitting on the side of a flat bed without using bedrails?: A Lot Help needed moving to and from a bed to a chair (including a wheelchair)?: A Little Help needed standing up from a chair using your arms (e.g., wheelchair or bedside chair)?: A Little Help needed to walk in hospital room?: A Little Help needed climbing 3-5 steps with a railing? : A Little 6 Click Score: 16    End of Session   Activity Tolerance: Patient tolerated treatment well;Patient limited by fatigue Patient left: in bed;with call bell/phone within reach;with bed alarm set   PT Visit Diagnosis: Difficulty in walking, not elsewhere classified (R26.2);Other abnormalities of gait  and mobility (R26.89);Muscle weakness (generalized) (M62.81)    Time: 4034-7425 PT Time Calculation (min) (ACUTE ONLY): 11 min   Charges:   PT Evaluation $PT Eval Low Complexity: 1 Low         10:11 AM, 11/16/22 Etta Grandchild, PT, DPT Physical Therapist - [facility]  [phone] ([place])    Dixon C 11/16/2022, 10:07 AM

## 2022-11-16 NOTE — Evaluation (Signed)
Occupational Therapy Evaluation Patient Details Name: Lindsey [name] MRN: [id number] DOB: [birth year] Today's Date: 11/16/2022   History of Present Illness Lindsey [name] is a 12yoF who comes to [facility] on 2/1 c weakness (unable to stand), coughing with blood, AMS. PMH: dementia (DTR is legal guardian), DM2, HTN, HLD, depression, osteopenia. Pt admitted with RSV infection. Pt here in ED in November 2023 after fall found down on floor x2 days.   Clinical Impression   Lindsey [name] was seen for OT evaluation this date. Prior to hospital admission, pt was generally independent with dressing and grooming. She has 2 PCA's who visit during the week day and a daughter who lives nearby who assists on the weekends and in the mornings. Pt goes periods of time alone including overnight per her PCA at bedside. Pt presents to acute OT demonstrating impaired ADL performance and functional mobility 2/2 impaired cognition, lethargy, and generalized weakness (See OT problem list). Pt currently requires MAX A to perform UB ADL management at bed level. She remains significantly lethargic t/o session only opening eyes briefly with stimulus. Per PCA, this is not pt's normal and RN notified to assess pt at end of session. Pt would benefit from skilled OT services to address noted impairments and functional limitations (see below for any additional details) in order to maximize safety and independence while minimizing falls risk and caregiver burden. Upon hospital discharge, recommend STR to maximize pt safety and return to PLOF.        Recommendations for follow up therapy are one component of a multi-disciplinary discharge planning process, led by the attending physician.  Recommendations may be updated based on patient status, additional functional criteria and insurance authorization.   Follow Up Recommendations  Skilled nursing-short term rehab (<3 hours/day)     Assistance Recommended at Discharge Frequent or constant  Supervision/Assistance  Patient can return home with the following A lot of help with bathing/dressing/bathroom;A lot of help with walking and/or transfers;Assistance with cooking/housework;Assistance with feeding;Assist for transportation    Functional Status Assessment  Patient has had a recent decline in their functional status and demonstrates the ability to make significant improvements in function in a reasonable and predictable amount of time.  Equipment Recommendations  BSC/3in1    Recommendations for Other Services       Precautions / Restrictions Precautions Precautions: Fall Restrictions Weight Bearing Restrictions: No      Mobility Bed Mobility Overal bed mobility: Needs Assistance             General bed mobility comments: deferred for pt safety    Transfers                          Balance                                           ADL either performed or assessed with clinical judgement   ADL Overall ADL's : Needs assistance/impaired                                       General ADL Comments: Significantly functionally limited by impaired cognition/level of arousal today. She requires MAX A to bring cup/food to mouth and is noted to take a bite of food but does not chew/swallow,  requires assist to safely clear mouth. Unable to follow VCs for EOB/OOB activity. Anticipate MAX A for LB ADL management.     Vision         Perception     Praxis      Pertinent Vitals/Pain Pain Assessment Pain Assessment: No/denies pain     Hand Dominance Right   Extremity/Trunk Assessment Upper Extremity Assessment Upper Extremity Assessment: Generalized weakness;Difficult to assess due to impaired cognition   Lower Extremity Assessment Lower Extremity Assessment: Generalized weakness;Difficult to assess due to impaired cognition       Communication Communication Communication: No difficulties   Cognition  Arousal/Alertness: Lethargic Behavior During Therapy: Flat affect Overall Cognitive Status: Impaired/Different from baseline                                 General Comments: Pt is somnolent t/o session. Caregiver at bedside states pt is not typically this lethargic. She has difficulty keeping her eyes open and does not answer A&O questions. Only opens eyes briefly with stimulus including oral swab and sternal rub. RN notified and in room at end of session to assess.     General Comments       Exercises Other Exercises Other Exercises: Pt education limited by cognition, caregiver at bedside educated on role of OT in acute setting, DC recs, strategies for maintaining pt engagement in tasks and keeping pt alert during the day.   Shoulder Instructions      Home Living Family/patient expects to be discharged to:: Private residence Living Arrangements: Children Available Help at Discharge: Personal care attendant;Family (Per caregiver pt goes short periods during the day and overnights alone. Has assistance from family on the weekends and PCA's during the week.)                                    Prior Functioning/Environment Prior Level of Function : Patient poor historian/Family not available             Mobility Comments: Per caregiver at bedside, pt uses a 4WW for [facility] mobility intermittently at baseline. ADLs Comments: PCA's assist with some bathing/dressing tasks. Pt able to feed herself, does grooming, UB ADL management independently at baseline.        OT Problem List: Decreased strength;Decreased coordination;Decreased range of motion;Decreased cognition;Decreased activity tolerance;Decreased safety awareness;Impaired balance (sitting and/or standing);Decreased knowledge of use of DME or AE      OT Treatment/Interventions: Self-care/ADL training;Therapeutic exercise;Therapeutic activities;DME and/or AE instruction;Patient/family education;Balance  training;Energy conservation;Cognitive remediation/compensation    OT Goals(Current goals can be found in the care plan section) Acute Rehab OT Goals Patient Stated Goal: To feel better OT Goal Formulation: With patient/family Time For Goal Achievement: 11/30/22 Potential to Achieve Goals: Fair ADL Goals Pt Will Perform Grooming: sitting;with supervision;with set-up Pt Will Perform Lower Body Dressing: sit to/from stand;with min assist;with adaptive equipment (c LRAD PRN) Pt Will Transfer to Toilet: bedside commode;with min assist;stand pivot transfer Pt Will Perform Toileting - Clothing Manipulation and hygiene: sit to/from stand;with min assist;with adaptive equipment  OT Frequency: Min 2X/week    Co-evaluation              AM-PAC OT "6 Clicks" Daily Activity     Outcome Measure Help from another person eating meals?: A Lot Help from another person taking care of personal grooming?: A Lot Help  from another person toileting, which includes using toliet, bedpan, or urinal?: A Lot Help from another person bathing (including washing, rinsing, drying)?: A Lot Help from another person to put on and taking off regular upper body clothing?: A Lot Help from another person to put on and taking off regular lower body clothing?: A Lot 6 Click Score: 12   End of Session Nurse Communication: Mobility status  Activity Tolerance: Patient limited by lethargy Patient left: in bed;with call bell/phone within reach;with bed alarm set  OT Visit Diagnosis: Other abnormalities of gait and mobility (R26.89);Muscle weakness (generalized) (M62.81);Other symptoms and signs involving cognitive function                Time: 1675-6125 OT Time Calculation (min): 29 min Charges:  OT General Charges $OT Visit: 1 Visit OT Evaluation $OT Eval Moderate Complexity: 1 Mod OT Treatments $Self Care/Home Management : 8-22 mins  Shara Blazing, M.S., OTR/L 11/16/22, 1:20 PM

## 2022-11-17 DIAGNOSIS — E669 Obesity, unspecified: Secondary | ICD-10-CM | POA: Diagnosis present

## 2022-11-17 DIAGNOSIS — E1136 Type 2 diabetes mellitus with diabetic cataract: Secondary | ICD-10-CM | POA: Diagnosis present

## 2022-11-17 DIAGNOSIS — M109 Gout, unspecified: Secondary | ICD-10-CM | POA: Diagnosis present

## 2022-11-17 DIAGNOSIS — Z8249 Family history of ischemic heart disease and other diseases of the circulatory system: Secondary | ICD-10-CM | POA: Diagnosis not present

## 2022-11-17 DIAGNOSIS — Z1152 Encounter for screening for COVID-19: Secondary | ICD-10-CM | POA: Diagnosis not present

## 2022-11-17 DIAGNOSIS — R440 Auditory hallucinations: Secondary | ICD-10-CM | POA: Diagnosis present

## 2022-11-17 DIAGNOSIS — I129 Hypertensive chronic kidney disease with stage 1 through stage 4 chronic kidney disease, or unspecified chronic kidney disease: Secondary | ICD-10-CM | POA: Diagnosis present

## 2022-11-17 DIAGNOSIS — F03918 Unspecified dementia, unspecified severity, with other behavioral disturbance: Secondary | ICD-10-CM | POA: Diagnosis present

## 2022-11-17 DIAGNOSIS — R5381 Other malaise: Secondary | ICD-10-CM | POA: Diagnosis present

## 2022-11-17 DIAGNOSIS — E11319 Type 2 diabetes mellitus with unspecified diabetic retinopathy without macular edema: Secondary | ICD-10-CM | POA: Diagnosis present

## 2022-11-17 DIAGNOSIS — M810 Age-related osteoporosis without current pathological fracture: Secondary | ICD-10-CM | POA: Diagnosis present

## 2022-11-17 DIAGNOSIS — Z807 Family history of other malignant neoplasms of lymphoid, hematopoietic and related tissues: Secondary | ICD-10-CM | POA: Diagnosis not present

## 2022-11-17 DIAGNOSIS — Z85828 Personal history of other malignant neoplasm of skin: Secondary | ICD-10-CM | POA: Diagnosis not present

## 2022-11-17 DIAGNOSIS — Z8262 Family history of osteoporosis: Secondary | ICD-10-CM | POA: Diagnosis not present

## 2022-11-17 DIAGNOSIS — E1122 Type 2 diabetes mellitus with diabetic chronic kidney disease: Secondary | ICD-10-CM | POA: Diagnosis present

## 2022-11-17 DIAGNOSIS — B974 Respiratory syncytial virus as the cause of diseases classified elsewhere: Secondary | ICD-10-CM | POA: Diagnosis present

## 2022-11-17 DIAGNOSIS — F32A Depression, unspecified: Secondary | ICD-10-CM | POA: Diagnosis present

## 2022-11-17 DIAGNOSIS — Z888 Allergy status to other drugs, medicaments and biological substances status: Secondary | ICD-10-CM | POA: Diagnosis not present

## 2022-11-17 DIAGNOSIS — B338 Other specified viral diseases: Secondary | ICD-10-CM | POA: Diagnosis not present

## 2022-11-17 DIAGNOSIS — R531 Weakness: Secondary | ICD-10-CM | POA: Diagnosis present

## 2022-11-17 DIAGNOSIS — J9811 Atelectasis: Secondary | ICD-10-CM | POA: Diagnosis present

## 2022-11-17 DIAGNOSIS — Z833 Family history of diabetes mellitus: Secondary | ICD-10-CM | POA: Diagnosis not present

## 2022-11-17 DIAGNOSIS — E785 Hyperlipidemia, unspecified: Secondary | ICD-10-CM | POA: Diagnosis present

## 2022-11-17 DIAGNOSIS — N1831 Chronic kidney disease, stage 3a: Secondary | ICD-10-CM | POA: Diagnosis present

## 2022-11-17 DIAGNOSIS — J069 Acute upper respiratory infection, unspecified: Secondary | ICD-10-CM | POA: Diagnosis present

## 2022-11-17 DIAGNOSIS — M797 Fibromyalgia: Secondary | ICD-10-CM | POA: Diagnosis present

## 2022-11-17 LAB — CBC
HCT: 32 % — ABNORMAL LOW (ref 36.0–46.0)
Hemoglobin: 10.6 g/dL — ABNORMAL LOW (ref 12.0–15.0)
MCH: 28.4 pg (ref 26.0–34.0)
MCHC: 33.1 g/dL (ref 30.0–36.0)
MCV: 85.8 fL (ref 80.0–100.0)
Platelets: 155 K/uL (ref 150–400)
RBC: 3.73 MIL/uL — ABNORMAL LOW (ref 3.87–5.11)
RDW: 13.7 % (ref 11.5–15.5)
WBC: 6.3 K/uL (ref 4.0–10.5)
nRBC: 0 % (ref 0.0–0.2)

## 2022-11-17 LAB — GLUCOSE, CAPILLARY: Glucose-Capillary: 132 mg/dL — ABNORMAL HIGH (ref 70–99)

## 2022-11-17 MED ORDER — VITAMIN D 25 MCG (1000 UNIT) PO TABS: 1000.0000 [IU] | ORAL_TABLET | Freq: Every day | ORAL | Status: AC

## 2022-11-17 NOTE — Plan of Care (Signed)
Pt D/C home with family. A&OX2, pt compliant with medicine. No c/o pain or S/S of infection. No SOB.BP 100/63 (BP Location: Right Arm)   Pulse 99   Temp 98.3 F (36.8 C)   Resp 16   Ht 5' (1.524 m)   Wt 73.5 kg   SpO2 95%   BMI 31.65 kg/m  AVS reviewed with daughter, personal items returned. IV pulled Pt d/c to family .

## 2022-11-17 NOTE — Progress Notes (Signed)
Physical Therapy Treatment Patient Details Name: Lindsey [name] MRN: [id number] DOB: 05/06/1951 Today's Date: 11/17/2022   History of Present Illness Lindsey [name] is a 73yoF who comes [facility] on 2/[facility] c weakness (unable to stand), coughing with blood, AMS. PMH: dementia (DTR is legal guardian), DM2, HTN, HLD, depression, osteopenia. Pt admitted with RSV infection. Pt here in ED in November 2023 after fall found down on floor x2 days.    PT Comments    Pt awake in bed, similar previous day, meal tray set up and prepared, but not much evidence of food consumed. Pt social, interactive, calm, disoriented to place, time, situation, as per likely baseline, but does seem to know she is not at home without Pryor Curia mentioning it. Pt agreeable to session. Physically is moving fairly well, does not require any physical assistance due to weakness, more for cuing and attending to task. Bed mobility is the hardest activity per effort, but she makes honest attempts. Pt able to AMB 150f into hallway and appears well tolerated, no signs of fatigue or effort, dementia precludes reliaible report of symptoms. Pt assisted back to bed in recliner position, offered food again, though pt uninterested. RN in room at end of session. Reviewed my findings with attending for purposes of DC planning.    Recommendations for follow up therapy are one component of a multi-disciplinary discharge planning process, led by the attending physician.  Recommendations may be updated based on patient status, additional functional criteria and insurance authorization.  Follow Up Recommendations  Home health PT     Assistance Recommended at Discharge Frequent or constant Supervision/Assistance  Patient can return home with the following Help with stairs or ramp for entrance;Assist for transportation;Assistance with cooking/housework   Equipment Recommendations  None recommended by PT    Recommendations for Other Services        Precautions / Restrictions Precautions Precautions: Fall Restrictions Weight Bearing Restrictions: No     Mobility  Bed Mobility   Bed Mobility: Supine to Sit     Supine to sit: Supervision     General bed mobility comments: offer provided minA to facilitate task in the setting of limited attention in advanced dementia, however pt does not need physical assist 2/2 weakness    Transfers Overall transfer level: Needs assistance Equipment used: Rolling walker (2 wheels) (YRw) Transfers: Sit to/from Stand Sit to Stand: Supervision           General transfer comment: from EOB, from Chair; able to balance well enough to close up her gown briefly    Ambulation/Gait Ambulation/Gait assistance: Supervision Gait Distance (Feet): 150 Feet Assistive device: Rolling walker (2 wheels) (YRW) Gait Pattern/deviations: Step-to pattern       General Gait Details: tall posture, safe RW use, pauses to regular comfort, no LOB, is mildly distrqactible, but follows cues to attend to itenerary walking   Stairs             Wheelchair Mobility    Modified Rankin (Stroke Patients Only)       Balance                                            Cognition Arousal/Alertness: Awake/alert Behavior During Therapy: WFL for tasks assessed/performed Overall Cognitive Status: Within Functional Limits for tasks assessed  Following Commands: Follows one step commands consistently                Exercises      General Comments        Pertinent Vitals/Pain Pain Assessment Pain Assessment: No/denies pain    Home Living                          Prior Function            PT Goals (current goals can now be found in the care plan section) Acute Rehab PT Goals PT Goal Formulation: Patient unable to participate in goal setting    Frequency    Min 2X/week      PT Plan Current plan remains appropriate     Co-evaluation              AM-PAC PT "6 Clicks" Mobility   Outcome Measure  Help needed turning from your back to your side while in a flat bed without using bedrails?: A Little Help needed moving from lying on your back to sitting on the side of a flat bed without using bedrails?: A Little Help needed moving to and from a bed to a chair (including a wheelchair)?: A Little Help needed standing up from a chair using your arms (e.g., wheelchair or bedside chair)?: A Little Help needed to walk in hospital room?: A Little Help needed climbing 3-5 steps with a railing? : A Little 6 Click Score: 18    End of Session Equipment Utilized During Treatment: Gait belt Activity Tolerance: Patient tolerated treatment well;No increased pain Patient left: in bed;with call bell/phone within reach;with nursing/sitter in room (in chair position)   PT Visit Diagnosis: Difficulty in walking, not elsewhere classified (R26.2);Other abnormalities of gait and mobility (R26.89);Muscle weakness (generalized) (M62.81)     Time: 3762-8315 PT Time Calculation (min) (ACUTE ONLY): 20 min  Charges:  $Therapeutic Activity: 8-22 mins                    10:22 AM, 11/17/22 Etta Grandchild, PT, DPT Physical Therapist - [facility]  [phone] ([place])    Consetta Cosner C 11/17/2022, 10:18 AM

## 2022-11-17 NOTE — Plan of Care (Signed)
  Problem: Coping: Goal: Ability to adjust to condition or change in health will improve Outcome: Progressing   Problem: Fluid Volume: Goal: Ability to maintain a balanced intake and output will improve Outcome: Progressing    Problem: Coping: Goal: Level of anxiety will decrease Outcome: Not Progressing   Problem: Coping: Goal: Ability to adjust to condition or change in health will improve 11/17/2022 0337 by Marilu Favre, RN Outcome: Progressing 11/17/2022 0336 by Marilu Favre, RN Outcome: Progressing   Problem: Fluid Volume: Goal: Ability to maintain a balanced intake and output will improve 11/17/2022 0337 by Marilu Favre, RN Outcome: Progressing 11/17/2022 0336 by Marilu Favre, RN Outcome: Progressing   Problem: Health Behavior/Discharge Planning: Goal: Ability to identify and utilize available resources and services will improve 11/17/2022 0337 by Marilu Favre, RN Outcome: Progressing 11/17/2022 0336 by Marilu Favre, RN Outcome: Progressing   Problem: Metabolic: Goal: Ability to maintain appropriate glucose levels will improve Outcome: Progressing   Problem: Skin Integrity: Goal: Risk for impaired skin integrity will decrease 11/17/2022 0337 by Marilu Favre, RN Outcome: Progressing 11/17/2022 0336 by Marilu Favre, RN Outcome: Progressing   Problem: Activity: Goal: Risk for activity intolerance will decrease Outcome: Progressing   Problem: Elimination: Goal: Will not experience complications related to bowel motility Outcome: Progressing   Problem: Nutritional: Goal: Maintenance of adequate nutrition will improve Outcome: Not Progressing patient not eating meals as provided; attempted to give small frequent portions   Problem: Coping: Goal: Level of anxiety will decrease Outcome: Not Progressing

## 2022-11-17 NOTE — Discharge Summary (Signed)
Lindsey [name]:[id number] DOB: May 25, 1951 DOA: 11/15/2022  PCP: Steele Sizer, MD  Admit date: 11/15/2022 Discharge date: 11/17/2022  Time spent: 35 minutes  Recommendations for Outpatient Follow-up:  Pcp f/u 1-2 wks        Discharge Diagnoses:  Principal Problem:   RSV infection Active Problems:   Benign essential HTN   Dementia with behavioral disturbance ([place])   Type 2 diabetes mellitus with diabetic cataract [facility])   Discharge Condition: improved  Diet recommendation: heart healthy  Filed Weights   11/15/22 1254  Weight: 73.5 kg    History of present illness:  From admission h and p Lindsey [name] is a 72 y.o. female with medical history significant of dementia with auditory hallucinations, type 2 diabetes, hypertension, hyperlipidemia, depression, osteopenia who presents to the ED due to generalized weakness.  History obtained from both patient and daughter due to patient's underlying dementia.   Lindsey [name] states that for the last 3 to 4 days, she has been experiencing a productive cough.  In addition, she endorses generalized weakness, malaise, poor appetite, nausea but denies any shortness of breath, chest pain, abdominal pain, vomiting or diarrhea.  Patient's daughter at bedside, Lindsey [name], agrees with the given history.  Generalized weakness began today but family denies noticing any focal weakness.  Lindsey [name] is unsure if patient has had any fevers.  They have also noticed that she has seemed more altered with potential auditory hallucinations since cough developed.  Hospital Course:  Patient presented with cough and generalized weakness. Found to have rsv. No dyspnea or o2 requirement, infection appears to be mild. CT head nothing acute. PT evaluated twice and patient ambulated well without assistance, appears to be at baseline. Does have baseline dementia and daughter reports fluctuations in mentation consistent with this. Weakness and confusion on presentation likely  precipitated by rsv infection. Appears to be back to baseline. Advise pcp f/u 1-2 weeks  Procedures: none   Consultations: none  Discharge Exam: Vitals:   11/16/22 2331 11/17/22 0857  BP: 103/65 100/63  Pulse: 91 99  Resp: 19 16  Temp: 97.6 F (36.4 C) 98.3 F (36.8 C)  SpO2: 92% 95%    General exam: Appears calm and comfortable  Respiratory system: Clear to auscultation. Respiratory effort normal. Cardiovascular system: S1 & S2 heard, RRR. No JVD, murmurs, rubs, gallops or clicks. No pedal edema. Gastrointestinal system: Abdomen is nondistended, soft and nontender. No organomegaly or masses felt. Normal bowel sounds heard. Central nervous system: Alert, equal strength Extremities: warm Skin: No rashes, lesions or ulcers Psychiatry: confused, calm  Discharge Instructions   Discharge Instructions     Diet - low sodium heart healthy   Complete by: As directed    Increase activity slowly   Complete by: As directed       Allergies as of 11/17/2022       Reactions   Ace Inhibitors         Medication List     TAKE these medications    allopurinol 100 MG tablet Commonly known as: ZYLOPRIM Take 1 tablet (100 mg total) by mouth 2 (two) times daily.   carvedilol 12.5 MG tablet Commonly known as: COREG Take 1 tablet (12.5 mg total) by mouth 2 (two) times daily with a meal.   cholecalciferol 25 MCG (1000 UNIT) tablet Commonly known as: VITAMIN D3 Take 1 tablet (1,000 Units total) by mouth daily. What changed:  medication strength how much to take   cyanocobalamin 1000 MCG tablet Commonly known  as: VITAMIN B12 Take 1,000 mcg by mouth daily.   donepezil 10 MG tablet Commonly known as: ARICEPT Take 1 tablet (10 mg total) by mouth at bedtime. What changed:  when to take this additional instructions   empagliflozin 25 MG Tabs tablet Commonly known as: Jardiance Take 1 tablet (25 mg total) by mouth daily.   GLUCOSE BLOOD VI   Janumet XR 50-1000 MG  Tb24 Generic drug: SitaGLIPtin-MetFORMIN HCl Take 2 tablets by mouth daily. What changed:  how much to take when to take this   losartan 25 MG tablet Commonly known as: COZAAR Take 1 tablet (25 mg total) by mouth daily.   Melatonin 5 MG Caps Take 5 mg by mouth at bedtime.   memantine 10 MG tablet Commonly known as: NAMENDA Take 10 mg by mouth 2 (two) times daily.   MULTIPLE VITAMIN PO Take 1 tablet by mouth daily.   QUEtiapine 100 MG tablet Commonly known as: SEROQUEL Take 1 tablet by mouth in the morning, at noon, and at bedtime. Taking '100mg'$  am, afternoon, and pm   rosuvastatin 20 MG tablet Commonly known as: CRESTOR Take 1 tablet (20 mg total) by mouth daily.   sertraline 50 MG tablet Commonly known as: ZOLOFT Take 50 mg by mouth every evening.   Vascepa 1 g capsule Generic drug: icosapent Ethyl Take 2 capsules (2 g total) by mouth 2 (two) times daily.       Allergies  Allergen Reactions   Ace Inhibitors     Follow-up Information     Steele Sizer, MD Follow up.   Specialty: Family Medicine Contact information: [address] [place] [address] [phone]                  The results of significant diagnostics from this hospitalization (including imaging, microbiology, ancillary and laboratory) are listed below for reference.    Significant Diagnostic Studies: CT Head Wo Contrast  Result Date: 11/15/2022 CLINICAL DATA:  Provided history: Mental status change, unknown cause. EXAM: CT HEAD WITHOUT CONTRAST TECHNIQUE: Contiguous axial images were obtained from the base of the skull through the vertex without intravenous contrast. RADIATION DOSE REDUCTION: This exam was performed according to the departmental dose-optimization program which includes automated exposure control, adjustment of the mA and/or kV according to patient size and/or use of iterative reconstruction technique. COMPARISON:  Prior head CT examinations 08/16/2022  and earlier. FINDINGS: Mildly motion degraded exam. Brain: Generalized cerebral atrophy. Mild patchy and ill-defined hypoattenuation within the cerebral white matter, nonspecific but compatible with chronic small vessel disease. There is no acute intracranial hemorrhage. No demarcated cortical infarct. No extra-axial fluid collection. No evidence of an intracranial mass. No midline shift. Vascular: No hyperdense vessel. Atherosclerotic calcifications. Skull: No fracture or aggressive osseous lesion. Sinuses/Orbits: No mass or acute finding within the imaged orbits. Minimal mucosal thickening within the left maxillary sinus at the imaged levels. IMPRESSION: 1. Mildly motion degraded exam. 2. No evidence of acute intracranial abnormality. 3. Parenchymal atrophy and chronic small vessel disease. Electronically Signed   By: Kellie Simmering D.O.   On: 11/15/2022 14:05   DG Chest Port 1 View  Result Date: 11/15/2022 CLINICAL DATA:  Sepsis. EXAM: PORTABLE CHEST 1 VIEW COMPARISON:  August 16, 2022. FINDINGS: Stable cardiomediastinal silhouette. Minimal bibasilar subsegmental atelectasis. Bony thorax is unremarkable. IMPRESSION: Minimal bibasilar subsegmental atelectasis. Electronically Signed   By: Marijo Conception M.D.   On: 11/15/2022 13:14    Microbiology: Recent Results (from the past 240  hour(s))  Blood Culture (routine x 2)     Status: None (Preliminary result)   Collection Time: 11/15/22 12:58 PM   Specimen: BLOOD  Result Value Ref Range Status   Specimen Description BLOOD BLOOD RIGHT FOREARM  Final   Special Requests   Final    BOTTLES DRAWN AEROBIC AND ANAEROBIC Blood Culture adequate volume   Culture   Final    NO GROWTH 2 DAYS Performed at [facility], [address] [place], [place] [address]    Report Status PENDING  Incomplete  Resp panel by RT-PCR (RSV, Flu A&B, Covid) Anterior Nasal Swab     Status: Abnormal   Collection Time: 11/15/22  1:01 PM   Specimen: Anterior Nasal Swab   Result Value Ref Range Status   SARS Coronavirus 2 by RT PCR NEGATIVE NEGATIVE Final    Comment: (NOTE) SARS-CoV-2 target nucleic acids are NOT DETECTED.  The SARS-CoV-2 RNA is generally detectable in upper respiratory specimens during the acute phase of infection. The lowest concentration of SARS-CoV-2 viral copies this assay can detect is 138 copies/mL. A negative result does not preclude SARS-Cov-2 infection and should not be used as the sole basis for treatment or other patient management decisions. A negative result may occur with  improper specimen collection/handling, submission of specimen other than nasopharyngeal swab, presence of viral mutation(s) within the areas targeted by this assay, and inadequate number of viral copies(<138 copies/mL). A negative result must be combined with clinical observations, patient history, and epidemiological information. The expected result is Negative.  Fact Sheet for Patients:  EntrepreneurPulse.com.au  Fact Sheet for Healthcare Providers:  IncredibleEmployment.be  This test is no t yet approved or cleared by the [place] FDA and  has been authorized for detection and/or diagnosis of SARS-CoV-2 by FDA under an Emergency Use Authorization (EUA). This EUA will remain  in effect (meaning this test can be used) for the duration of the COVID-19 declaration under Section 564(b)(1) of the Act, 21 U.S.C.section 360bbb-3(b)(1), unless the authorization is terminated  or revoked sooner.       Influenza A by PCR NEGATIVE NEGATIVE Final   Influenza B by PCR NEGATIVE NEGATIVE Final    Comment: (NOTE) The Xpert Xpress SARS-CoV-2/FLU/RSV plus assay is intended as an aid in the diagnosis of influenza from Nasopharyngeal swab specimens and should not be used as a sole basis for treatment. Nasal washings and aspirates are unacceptable for Xpert Xpress SARS-CoV-2/FLU/RSV testing.  Fact Sheet for  Patients: EntrepreneurPulse.com.au  Fact Sheet for Healthcare Providers: IncredibleEmployment.be  This test is not yet approved or cleared by the [place] FDA and has been authorized for detection and/or diagnosis of SARS-CoV-2 by FDA under an Emergency Use Authorization (EUA). This EUA will remain in effect (meaning this test can be used) for the duration of the COVID-19 declaration under Section 564(b)(1) of the Act, 21 U.S.C. section 360bbb-3(b)(1), unless the authorization is terminated or revoked.     Resp Syncytial Virus by PCR POSITIVE (A) NEGATIVE Final    Comment: (NOTE) Fact Sheet for Patients: EntrepreneurPulse.com.au  Fact Sheet for Healthcare Providers: IncredibleEmployment.be  This test is not yet approved or cleared by the [place] FDA and has been authorized for detection and/or diagnosis of SARS-CoV-2 by FDA under an Emergency Use Authorization (EUA). This EUA will remain in effect (meaning this test can be used) for the duration of the COVID-19 declaration under Section 564(b)(1) of the Act, 21 U.S.C. section 360bbb-3(b)(1), unless the authorization is terminated or  revoked.  Performed at [facility], [address] [place], [place] [address]   Blood Culture (routine x 2)     Status: None (Preliminary result)   Collection Time: 11/15/22  1:05 PM   Specimen: BLOOD  Result Value Ref Range Status   Specimen Description BLOOD BLOOD LEFT HAND  Final   Special Requests   Final    BOTTLES DRAWN AEROBIC AND ANAEROBIC Blood Culture adequate volume   Culture   Final    NO GROWTH 2 DAYS Performed at [facility], [address] [place], [place] [address]    Report Status PENDING  Incomplete     Labs: Basic Metabolic Panel: Recent Labs  Lab 11/15/22 1300 11/16/22 0431  NA 140 137  K 4.2 3.6  CL 103 102  CO2 27 27  GLUCOSE 171* 114*  BUN 21 18   CREATININE 1.16* 0.93  CALCIUM 10.1 9.3   Liver Function Tests: Recent Labs  Lab 11/15/22 1300  AST 29  ALT 22  ALKPHOS 63  BILITOT 0.6  PROT 6.7  ALBUMIN 3.8   No results for input(s): "LIPASE", "AMYLASE" in the last 168 hours. No results for input(s): "AMMONIA" in the last 168 hours. CBC: Recent Labs  Lab 11/15/22 1300 11/16/22 0431 11/17/22 0634  WBC 7.1 7.6 6.3  NEUTROABS 4.6 5.1  --   HGB 11.6* 10.5* 10.6*  HCT 35.1* 32.2* 32.0*  MCV 87.1 87.3 85.8  PLT 167 148* 155   Cardiac Enzymes: No results for input(s): "CKTOTAL", "CKMB", "CKMBINDEX", "TROPONINI" in the last 168 hours. BNP: BNP (last 3 results) No results for input(s): "BNP" in the last 8760 hours.  ProBNP (last 3 results) No results for input(s): "PROBNP" in the last 8760 hours.  CBG: Recent Labs  Lab 11/15/22 2317 11/16/22 0848 11/16/22 1206 11/16/22 2020 11/17/22 0424  GLUCAP 123* 102* 102* 107* 132*       Signed:  Desma Maxim MD.  [facility] 11/17/2022, 12:00 PM

## 2022-11-19 ENCOUNTER — Telehealth: Payer: Self-pay

## 2022-11-19 NOTE — Telephone Encounter (Signed)
Transition Care Management Follow-up Telephone Call Date of discharge and from where: [place] 11/17/2022 How have you been since you were released from the hospital? weak Any questions or concerns? No  Items Reviewed: Did the pt receive and understand the discharge instructions provided? Yes  Medications obtained and verified? Yes  Other? No  Any new allergies since your discharge? No  Dietary orders reviewed? Yes Do you have support at home? Yes   Home Care and Equipment/Supplies: Were home health services ordered? yes If so, what is the name of the agency? unknown  Has the agency set up a time to come to the patient's home? no Were any new equipment or medical supplies ordered?  No What is the name of the medical supply agency? N/a Were you able to get the supplies/equipment? no Do you have any questions related to the use of the equipment or supplies? No  Functional Questionnaire: (I = Independent and D = Dependent) ADLs: I  Bathing/Dressing- D  Meal Prep- D  Eating- I  Maintaining continence- I  Transferring/Ambulation- D  Managing Meds- D  Follow up appointments reviewed:  PCP Hospital f/u appt confirmed? No  no avail appts. Sent message to staff to schedule Specialist Hospital f/u appt confirmed? no Are transportation arrangements needed? No  If their condition worsens, is the pt aware to call PCP or go to the Emergency Dept.? Yes Was the patient provided with contact information for the PCP's office or ED? Yes Was to pt encouraged to call back with questions or concerns? Yes Juanda Crumble, LPN [facility] [phone]

## 2022-11-20 LAB — CULTURE, BLOOD (ROUTINE X 2)
Culture: NO GROWTH
Culture: NO GROWTH
Special Requests: ADEQUATE
Special Requests: ADEQUATE

## 2022-11-20 NOTE — Patient Instructions (Signed)
Preventive Care 65 Years and Older, Female Preventive care refers to lifestyle choices and visits with your health care provider that can promote health and wellness. Preventive care visits are also called wellness exams. What can I expect for my preventive care visit? Counseling Your health care provider may ask you questions about your: Medical history, including: Past medical problems. Family medical history. Pregnancy and menstrual history. History of falls. Current health, including: Memory and ability to understand (cognition). Emotional well-being. Home life and relationship well-being. Sexual activity and sexual health. Lifestyle, including: Alcohol, nicotine or tobacco, and drug use. Access to firearms. Diet, exercise, and sleep habits. Work and work environment. Sunscreen use. Safety issues such as seatbelt and bike helmet use. Physical exam Your health care provider will check your: Height and weight. These may be used to calculate your BMI (body mass index). BMI is a measurement that tells if you are at a healthy weight. Waist circumference. This measures the distance around your waistline. This measurement also tells if you are at a healthy weight and may help predict your risk of certain diseases, such as type 2 diabetes and high blood pressure. Heart rate and blood pressure. Body temperature. Skin for abnormal spots. What immunizations do I need?  Vaccines are usually given at various ages, according to a schedule. Your health care provider will recommend vaccines for you based on your age, medical history, and lifestyle or other factors, such as travel or where you work. What tests do I need? Screening Your health care provider may recommend screening tests for certain conditions. This may include: Lipid and cholesterol levels. Hepatitis C test. Hepatitis B test. HIV (human immunodeficiency virus) test. STI (sexually transmitted infection) testing, if you are at  risk. Lung cancer screening. Colorectal cancer screening. Diabetes screening. This is done by checking your blood sugar (glucose) after you have not eaten for a while (fasting). Mammogram. Talk with your health care provider about how often you should have regular mammograms. BRCA-related cancer screening. This may be done if you have a family history of breast, ovarian, tubal, or peritoneal cancers. Bone density scan. This is done to screen for osteoporosis. Talk with your health care provider about your test results, treatment options, and if necessary, the need for more tests. Follow these instructions at home: Eating and drinking  Eat a diet that includes fresh fruits and vegetables, whole grains, lean protein, and low-fat dairy products. Limit your intake of foods with high amounts of sugar, saturated fats, and salt. Take vitamin and mineral supplements as recommended by your health care provider. Do not drink alcohol if your health care provider tells you not to drink. If you drink alcohol: Limit how much you have to 0-1 drink a day. Know how much alcohol is in your drink. In the [place] one drink equals one 12 oz bottle of beer (355 mL), one 5 oz glass of wine (148 mL), or one 1 oz glass of hard liquor (44 mL). Lifestyle Brush your teeth every morning and night with fluoride toothpaste. Floss one time each day. Exercise for at least 30 minutes 5 or more days each week. Do not use any products that contain nicotine or tobacco. These products include cigarettes, chewing tobacco, and vaping devices, such as e-cigarettes. If you need help quitting, ask your health care provider. Do not use drugs. If you are sexually active, practice safe sex. Use a condom or other form of protection in order to prevent STIs. Take aspirin only as told by   your health care provider. Make sure that you understand how much to take and what form to take. Work with your health care provider to find out whether it  is safe and beneficial for you to take aspirin daily. Ask your health care provider if you need to take a cholesterol-lowering medicine (statin). Find healthy ways to manage stress, such as: Meditation, yoga, or listening to music. Journaling. Talking to a trusted person. Spending time with friends and family. Minimize exposure to UV radiation to reduce your risk of skin cancer. Safety Always wear your seat belt while driving or riding in a vehicle. Do not drive: If you have been drinking alcohol. Do not ride with someone who has been drinking. When you are tired or distracted. While texting. If you have been using any mind-altering substances or drugs. Wear a helmet and other protective equipment during sports activities. If you have firearms in your house, make sure you follow all gun safety procedures. What's next? Visit your health care provider once a year for an annual wellness visit. Ask your health care provider how often you should have your eyes and teeth checked. Stay up to date on all vaccines. This information is not intended to replace advice given to you by your health care provider. Make sure you discuss any questions you have with your health care provider. Document Revised: 03/29/2021 Document Reviewed: 03/29/2021 Elsevier Patient Education  2023 [employer]  
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
## 2022-11-21 ENCOUNTER — Ambulatory Visit (INDEPENDENT_AMBULATORY_CARE_PROVIDER_SITE_OTHER): Payer: 59 | Admitting: Family Medicine

## 2022-11-21 ENCOUNTER — Encounter: Payer: Self-pay | Admitting: Family Medicine

## 2022-11-21 VITALS — BP 136/86 | HR 97 | Temp 97.8°F | Resp 16 | Ht 60.0 in | Wt 136.9 lb

## 2022-11-21 DIAGNOSIS — Z09 Encounter for follow-up examination after completed treatment for conditions other than malignant neoplasm: Secondary | ICD-10-CM | POA: Diagnosis not present

## 2022-11-22 NOTE — Telephone Encounter (Signed)
done 

## 2022-11-28 ENCOUNTER — Ambulatory Visit: Payer: Self-pay

## 2022-11-28 ENCOUNTER — Telehealth: Payer: Self-pay | Admitting: Family Medicine

## 2022-11-28 DIAGNOSIS — J988 Other specified respiratory disorders: Secondary | ICD-10-CM | POA: Diagnosis not present

## 2022-11-28 DIAGNOSIS — E785 Hyperlipidemia, unspecified: Secondary | ICD-10-CM | POA: Diagnosis not present

## 2022-11-28 DIAGNOSIS — M810 Age-related osteoporosis without current pathological fracture: Secondary | ICD-10-CM | POA: Diagnosis not present

## 2022-11-28 DIAGNOSIS — B351 Tinea unguium: Secondary | ICD-10-CM | POA: Diagnosis not present

## 2022-11-28 DIAGNOSIS — G4709 Other insomnia: Secondary | ICD-10-CM | POA: Diagnosis not present

## 2022-11-28 DIAGNOSIS — E1122 Type 2 diabetes mellitus with diabetic chronic kidney disease: Secondary | ICD-10-CM | POA: Diagnosis not present

## 2022-11-28 DIAGNOSIS — E1136 Type 2 diabetes mellitus with diabetic cataract: Secondary | ICD-10-CM | POA: Diagnosis not present

## 2022-11-28 DIAGNOSIS — C4491 Basal cell carcinoma of skin, unspecified: Secondary | ICD-10-CM | POA: Diagnosis not present

## 2022-11-28 DIAGNOSIS — M797 Fibromyalgia: Secondary | ICD-10-CM | POA: Diagnosis not present

## 2022-11-28 DIAGNOSIS — Z7984 Long term (current) use of oral hypoglycemic drugs: Secondary | ICD-10-CM | POA: Diagnosis not present

## 2022-11-28 DIAGNOSIS — I129 Hypertensive chronic kidney disease with stage 1 through stage 4 chronic kidney disease, or unspecified chronic kidney disease: Secondary | ICD-10-CM | POA: Diagnosis not present

## 2022-11-28 DIAGNOSIS — F03A18 Unspecified dementia, mild, with other behavioral disturbance: Secondary | ICD-10-CM | POA: Diagnosis not present

## 2022-11-28 DIAGNOSIS — D631 Anemia in chronic kidney disease: Secondary | ICD-10-CM | POA: Diagnosis not present

## 2022-11-28 DIAGNOSIS — F03A3 Unspecified dementia, mild, with mood disturbance: Secondary | ICD-10-CM | POA: Diagnosis not present

## 2022-11-28 DIAGNOSIS — M109 Gout, unspecified: Secondary | ICD-10-CM | POA: Diagnosis not present

## 2022-11-28 DIAGNOSIS — M8580 Other specified disorders of bone density and structure, unspecified site: Secondary | ICD-10-CM | POA: Diagnosis not present

## 2022-11-28 DIAGNOSIS — Z79899 Other long term (current) drug therapy: Secondary | ICD-10-CM | POA: Diagnosis not present

## 2022-11-28 DIAGNOSIS — E441 Mild protein-calorie malnutrition: Secondary | ICD-10-CM | POA: Diagnosis not present

## 2022-11-28 DIAGNOSIS — N1831 Chronic kidney disease, stage 3a: Secondary | ICD-10-CM | POA: Diagnosis not present

## 2022-11-28 NOTE — Telephone Encounter (Signed)
Received a call form Ebony Hail from [facility] regarding a drug to drug interaction.  Chief Complaint: drug to drug interaction [x]$  Follow-up with PCP Additional Notes:  Drug drug interaction: donepezil: seroquel: stated causes prolonged QTC interval and  increased risk cardiac arrhythmia. Will route to office.  Reason for Disposition  [1] Caller has URGENT medicine question about med that PCP or specialist prescribed AND [2] triager unable to answer question  Answer Assessment - Initial Assessment Questions 1. REASON FOR CALL or QUESTION: "What is your reason for calling today?" or "How can I best help you?" or "What question do you have that I can help answer?"     Drug Drug interaction  Protocols used: Information Only Call - No Triage-A-AH, Medication Question Call-A-AH

## 2022-11-28 NOTE — Telephone Encounter (Unsigned)
Copied from [place] [id number]. Topic: Quick Communication - Home Health Verbal Orders >> Nov 28, 2022  3:24 PM Leilani Able wrote: Caller/Agency: [facility] Number: [phone] Requesting OT for 1 time a wk for 8 wks

## 2022-11-30 NOTE — Telephone Encounter (Signed)
Completed.

## 2022-12-06 DIAGNOSIS — F03A3 Unspecified dementia, mild, with mood disturbance: Secondary | ICD-10-CM | POA: Diagnosis not present

## 2022-12-06 DIAGNOSIS — Z7984 Long term (current) use of oral hypoglycemic drugs: Secondary | ICD-10-CM | POA: Diagnosis not present

## 2022-12-06 DIAGNOSIS — I129 Hypertensive chronic kidney disease with stage 1 through stage 4 chronic kidney disease, or unspecified chronic kidney disease: Secondary | ICD-10-CM | POA: Diagnosis not present

## 2022-12-06 DIAGNOSIS — J988 Other specified respiratory disorders: Secondary | ICD-10-CM | POA: Diagnosis not present

## 2022-12-06 DIAGNOSIS — E785 Hyperlipidemia, unspecified: Secondary | ICD-10-CM | POA: Diagnosis not present

## 2022-12-06 DIAGNOSIS — M797 Fibromyalgia: Secondary | ICD-10-CM | POA: Diagnosis not present

## 2022-12-06 DIAGNOSIS — E1136 Type 2 diabetes mellitus with diabetic cataract: Secondary | ICD-10-CM | POA: Diagnosis not present

## 2022-12-06 DIAGNOSIS — M8580 Other specified disorders of bone density and structure, unspecified site: Secondary | ICD-10-CM | POA: Diagnosis not present

## 2022-12-06 DIAGNOSIS — M810 Age-related osteoporosis without current pathological fracture: Secondary | ICD-10-CM | POA: Diagnosis not present

## 2022-12-06 DIAGNOSIS — F03A18 Unspecified dementia, mild, with other behavioral disturbance: Secondary | ICD-10-CM | POA: Diagnosis not present

## 2022-12-06 DIAGNOSIS — Z79899 Other long term (current) drug therapy: Secondary | ICD-10-CM | POA: Diagnosis not present

## 2022-12-06 DIAGNOSIS — E441 Mild protein-calorie malnutrition: Secondary | ICD-10-CM | POA: Diagnosis not present

## 2022-12-06 DIAGNOSIS — M109 Gout, unspecified: Secondary | ICD-10-CM | POA: Diagnosis not present

## 2022-12-06 DIAGNOSIS — D631 Anemia in chronic kidney disease: Secondary | ICD-10-CM | POA: Diagnosis not present

## 2022-12-06 DIAGNOSIS — B351 Tinea unguium: Secondary | ICD-10-CM | POA: Diagnosis not present

## 2022-12-06 DIAGNOSIS — N1831 Chronic kidney disease, stage 3a: Secondary | ICD-10-CM | POA: Diagnosis not present

## 2022-12-06 DIAGNOSIS — G4709 Other insomnia: Secondary | ICD-10-CM | POA: Diagnosis not present

## 2022-12-06 DIAGNOSIS — C4491 Basal cell carcinoma of skin, unspecified: Secondary | ICD-10-CM | POA: Diagnosis not present

## 2022-12-06 DIAGNOSIS — E1122 Type 2 diabetes mellitus with diabetic chronic kidney disease: Secondary | ICD-10-CM | POA: Diagnosis not present

## 2022-12-11 DIAGNOSIS — M109 Gout, unspecified: Secondary | ICD-10-CM | POA: Diagnosis not present

## 2022-12-11 DIAGNOSIS — D631 Anemia in chronic kidney disease: Secondary | ICD-10-CM | POA: Diagnosis not present

## 2022-12-11 DIAGNOSIS — B351 Tinea unguium: Secondary | ICD-10-CM | POA: Diagnosis not present

## 2022-12-11 DIAGNOSIS — E1122 Type 2 diabetes mellitus with diabetic chronic kidney disease: Secondary | ICD-10-CM | POA: Diagnosis not present

## 2022-12-11 DIAGNOSIS — C4491 Basal cell carcinoma of skin, unspecified: Secondary | ICD-10-CM | POA: Diagnosis not present

## 2022-12-11 DIAGNOSIS — F03A18 Unspecified dementia, mild, with other behavioral disturbance: Secondary | ICD-10-CM | POA: Diagnosis not present

## 2022-12-11 DIAGNOSIS — G4709 Other insomnia: Secondary | ICD-10-CM | POA: Diagnosis not present

## 2022-12-11 DIAGNOSIS — M797 Fibromyalgia: Secondary | ICD-10-CM | POA: Diagnosis not present

## 2022-12-11 DIAGNOSIS — N1831 Chronic kidney disease, stage 3a: Secondary | ICD-10-CM | POA: Diagnosis not present

## 2022-12-11 DIAGNOSIS — E1136 Type 2 diabetes mellitus with diabetic cataract: Secondary | ICD-10-CM | POA: Diagnosis not present

## 2022-12-11 DIAGNOSIS — E785 Hyperlipidemia, unspecified: Secondary | ICD-10-CM | POA: Diagnosis not present

## 2022-12-11 DIAGNOSIS — I129 Hypertensive chronic kidney disease with stage 1 through stage 4 chronic kidney disease, or unspecified chronic kidney disease: Secondary | ICD-10-CM | POA: Diagnosis not present

## 2022-12-11 DIAGNOSIS — E441 Mild protein-calorie malnutrition: Secondary | ICD-10-CM | POA: Diagnosis not present

## 2022-12-11 DIAGNOSIS — J988 Other specified respiratory disorders: Secondary | ICD-10-CM | POA: Diagnosis not present

## 2022-12-11 DIAGNOSIS — Z79899 Other long term (current) drug therapy: Secondary | ICD-10-CM | POA: Diagnosis not present

## 2022-12-11 DIAGNOSIS — F03A3 Unspecified dementia, mild, with mood disturbance: Secondary | ICD-10-CM | POA: Diagnosis not present

## 2022-12-11 DIAGNOSIS — M8580 Other specified disorders of bone density and structure, unspecified site: Secondary | ICD-10-CM | POA: Diagnosis not present

## 2022-12-11 DIAGNOSIS — M810 Age-related osteoporosis without current pathological fracture: Secondary | ICD-10-CM | POA: Diagnosis not present

## 2022-12-11 DIAGNOSIS — Z7984 Long term (current) use of oral hypoglycemic drugs: Secondary | ICD-10-CM | POA: Diagnosis not present

## 2022-12-19 DIAGNOSIS — D631 Anemia in chronic kidney disease: Secondary | ICD-10-CM | POA: Diagnosis not present

## 2022-12-19 DIAGNOSIS — M797 Fibromyalgia: Secondary | ICD-10-CM | POA: Diagnosis not present

## 2022-12-19 DIAGNOSIS — C4491 Basal cell carcinoma of skin, unspecified: Secondary | ICD-10-CM | POA: Diagnosis not present

## 2022-12-19 DIAGNOSIS — M810 Age-related osteoporosis without current pathological fracture: Secondary | ICD-10-CM | POA: Diagnosis not present

## 2022-12-19 DIAGNOSIS — B351 Tinea unguium: Secondary | ICD-10-CM | POA: Diagnosis not present

## 2022-12-19 DIAGNOSIS — J988 Other specified respiratory disorders: Secondary | ICD-10-CM | POA: Diagnosis not present

## 2022-12-19 DIAGNOSIS — E1136 Type 2 diabetes mellitus with diabetic cataract: Secondary | ICD-10-CM | POA: Diagnosis not present

## 2022-12-19 DIAGNOSIS — Z7984 Long term (current) use of oral hypoglycemic drugs: Secondary | ICD-10-CM | POA: Diagnosis not present

## 2022-12-19 DIAGNOSIS — N1831 Chronic kidney disease, stage 3a: Secondary | ICD-10-CM | POA: Diagnosis not present

## 2022-12-19 DIAGNOSIS — M8580 Other specified disorders of bone density and structure, unspecified site: Secondary | ICD-10-CM | POA: Diagnosis not present

## 2022-12-19 DIAGNOSIS — E1122 Type 2 diabetes mellitus with diabetic chronic kidney disease: Secondary | ICD-10-CM | POA: Diagnosis not present

## 2022-12-19 DIAGNOSIS — E441 Mild protein-calorie malnutrition: Secondary | ICD-10-CM | POA: Diagnosis not present

## 2022-12-19 DIAGNOSIS — I129 Hypertensive chronic kidney disease with stage 1 through stage 4 chronic kidney disease, or unspecified chronic kidney disease: Secondary | ICD-10-CM | POA: Diagnosis not present

## 2022-12-19 DIAGNOSIS — F03A3 Unspecified dementia, mild, with mood disturbance: Secondary | ICD-10-CM | POA: Diagnosis not present

## 2022-12-19 DIAGNOSIS — G4709 Other insomnia: Secondary | ICD-10-CM | POA: Diagnosis not present

## 2022-12-19 DIAGNOSIS — E785 Hyperlipidemia, unspecified: Secondary | ICD-10-CM | POA: Diagnosis not present

## 2022-12-19 DIAGNOSIS — M109 Gout, unspecified: Secondary | ICD-10-CM | POA: Diagnosis not present

## 2022-12-19 DIAGNOSIS — F03A18 Unspecified dementia, mild, with other behavioral disturbance: Secondary | ICD-10-CM | POA: Diagnosis not present

## 2022-12-19 DIAGNOSIS — Z79899 Other long term (current) drug therapy: Secondary | ICD-10-CM | POA: Diagnosis not present

## 2022-12-27 DIAGNOSIS — E1122 Type 2 diabetes mellitus with diabetic chronic kidney disease: Secondary | ICD-10-CM | POA: Diagnosis not present

## 2022-12-27 DIAGNOSIS — I129 Hypertensive chronic kidney disease with stage 1 through stage 4 chronic kidney disease, or unspecified chronic kidney disease: Secondary | ICD-10-CM | POA: Diagnosis not present

## 2022-12-27 DIAGNOSIS — M810 Age-related osteoporosis without current pathological fracture: Secondary | ICD-10-CM | POA: Diagnosis not present

## 2022-12-27 DIAGNOSIS — E1136 Type 2 diabetes mellitus with diabetic cataract: Secondary | ICD-10-CM | POA: Diagnosis not present

## 2022-12-27 DIAGNOSIS — E785 Hyperlipidemia, unspecified: Secondary | ICD-10-CM | POA: Diagnosis not present

## 2022-12-27 DIAGNOSIS — F03A18 Unspecified dementia, mild, with other behavioral disturbance: Secondary | ICD-10-CM | POA: Diagnosis not present

## 2022-12-27 DIAGNOSIS — C4491 Basal cell carcinoma of skin, unspecified: Secondary | ICD-10-CM | POA: Diagnosis not present

## 2022-12-27 DIAGNOSIS — M109 Gout, unspecified: Secondary | ICD-10-CM | POA: Diagnosis not present

## 2022-12-27 DIAGNOSIS — E441 Mild protein-calorie malnutrition: Secondary | ICD-10-CM | POA: Diagnosis not present

## 2022-12-27 DIAGNOSIS — G4709 Other insomnia: Secondary | ICD-10-CM | POA: Diagnosis not present

## 2022-12-27 DIAGNOSIS — Z7984 Long term (current) use of oral hypoglycemic drugs: Secondary | ICD-10-CM | POA: Diagnosis not present

## 2022-12-27 DIAGNOSIS — J988 Other specified respiratory disorders: Secondary | ICD-10-CM | POA: Diagnosis not present

## 2022-12-27 DIAGNOSIS — F03A3 Unspecified dementia, mild, with mood disturbance: Secondary | ICD-10-CM | POA: Diagnosis not present

## 2022-12-27 DIAGNOSIS — M8580 Other specified disorders of bone density and structure, unspecified site: Secondary | ICD-10-CM | POA: Diagnosis not present

## 2022-12-27 DIAGNOSIS — Z79899 Other long term (current) drug therapy: Secondary | ICD-10-CM | POA: Diagnosis not present

## 2022-12-27 DIAGNOSIS — M797 Fibromyalgia: Secondary | ICD-10-CM | POA: Diagnosis not present

## 2022-12-27 DIAGNOSIS — B351 Tinea unguium: Secondary | ICD-10-CM | POA: Diagnosis not present

## 2022-12-27 DIAGNOSIS — N1831 Chronic kidney disease, stage 3a: Secondary | ICD-10-CM | POA: Diagnosis not present

## 2022-12-27 DIAGNOSIS — D631 Anemia in chronic kidney disease: Secondary | ICD-10-CM | POA: Diagnosis not present

## 2023-01-03 NOTE — Progress Notes (Signed)
Name: Lindsey [name]   MRN: [id number]    DOB: 1950-11-02   Date:01/04/2023       Progress Note  Subjective  Chief Complaint  Follow Up  HPI  DMII:   HgbA1C has been out of control 8.1%, 8.2% 8.2% , 6.9%, 6.7% , 7.4% , 7 % ,6.8 % 6.5 % , 7.1 % 6.5 %, 7.4% and today is 6.7 %   She has an aid coming to her house daily for breakfast , she also has an aid during dinner time and recently due to weight loss a family member has been going to the house to make sure she eats lunch.   She denies polyphagia, polydipsia or polyuria. No recent episodes of hypoglycemia  She has obesity , CKI stage III , and last urine micro was up from 8 to 31  dyslipidemia.she is on statin therapy and ARB.  A1C is  back to normal range, we did not adjust medications, but she has been more active lately    Osteopenia: she was on Evista, but switched to Fosamax because of insurance changes. She is now off Alendronate since she took medication for over 5 years   Auditory Hallucination/Dementia: seen by Dr. Melrose Nakayama and also  by  Dr. Donalynn Furlong - geriatrician. She is still taking Seroquel  100 mg TID  and 10 mg of  Aricept, and Namenda twice daily and zoloft as prescribed. Daughter states she is doing well lately, only has episodes when she does not sleep well for a couple of nights    HTN:  She denies chest pain, palpitation or dizziness.  She is compliant with medication - daughter dispenses medications for her    Hyperlipidemia: taking Crestor and denies side effects of medication, including no muscle ache LDL at goal at 35 ( 01/23). We will recheck labs today    Major Depression in remission: daughter does not think she is depressed but is anxious, gets frustrated for not remembering things at times but not as often as it used to be. She is compliant with zoloft and seroquel. Unchanged   She still lives alone, daughter manages  her bills, doctors visits, and she also dispenses her medications, she has an  Aid that spends the  mornings with her,family pays for an aid to go at night to feed and bath her but she is able to sleep on her own - she does not get out of bed and they have a camera that allows them to watch her .  She forgets to eat or picks at her food, but weight is stable now . She needs assistance bathing, getting dressed, taking medications, no longer cooks or cleans. 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 ([place]) 01/04/2023   RSV infection 11/15/2022   Mild protein-calorie malnutrition ([place]) 05/08/2022   Stage 3a chronic kidney disease ([place]) 05/08/2022   Type 2 diabetes mellitus with microalbuminuria, without long-term current use of insulin ([place]) 05/08/2022   Pain due to onychomycosis of toenails of both feet 05/04/2019   Dementia with behavioral disturbance ([place]) 01/09/2016   Mild dementia ([place]) 11/01/2015   Auditory hallucination 07/26/2015   Anemia of chronic disease 07/05/2015  Cataract 04/06/2015   Type 2 diabetes mellitus with diabetic cataract ([place]) 04/06/2015   Benign essential HTN 04/02/2015   Controlled gout 04/02/2015   Dyslipidemia 04/02/2015   Fibromyalgia syndrome 04/02/2015   Chronic insomnia 04/02/2015   Cognitive decline 04/02/2015   Nodular basal cell carcinoma 04/02/2015   Osteopenia 04/02/2015   Calcium blood increased 04/02/2015    Past Surgical History:  Procedure Laterality Date   CESAREAN SECTION     TONSILECTOMY, ADENOIDECTOMY, BILATERAL MYRINGOTOMY AND TUBES     TUBAL LIGATION      Family History  Problem Relation Age of Onset   Hypertension Mother    Osteoporosis Mother    Cancer Mother        brain tumor   Hodgkin's lymphoma Father    Hypertension Sister    Cancer Sister         unknown   Hypertension Sister    Diabetes Sister    Cancer Sister 87       breast   Breast cancer Sister 54   Cancer Maternal Uncle     Social History   Tobacco Use   Smoking status: Never   Smokeless tobacco: Never   Tobacco comments:    smoking cessation materials not required  Substance Use Topics   Alcohol use: No     Current Outpatient Medications:    allopurinol (ZYLOPRIM) 100 MG tablet, Take 1 tablet (100 mg total) by mouth 2 (two) times daily., Disp: 180 tablet, Rfl: 1   carvedilol (COREG) 12.5 MG tablet, Take 1 tablet (12.5 mg total) by mouth 2 (two) times daily with a meal., Disp: 180 tablet, Rfl: 1   cholecalciferol (VITAMIN D3) 25 MCG (1000 UNIT) tablet, Take 1 tablet (1,000 Units total) by mouth daily., Disp: , Rfl:    cyanocobalamin (VITAMIN B12) 1000 MCG tablet, Take 1,000 mcg by mouth daily., Disp: , Rfl:    donepezil (ARICEPT) 10 MG tablet, Take 1 tablet (10 mg total) by mouth at bedtime. (Patient taking differently: Take 10 mg by mouth daily. In the morning), Disp: 90 tablet, Rfl: 1   empagliflozin (JARDIANCE) 25 MG TABS tablet, Take 1 tablet (25 mg total) by mouth daily., Disp: 90 tablet, Rfl: 1   GLUCOSE BLOOD VI, , Disp: , Rfl:    losartan (COZAAR) 25 MG tablet, Take 1 tablet (25 mg total) by mouth daily., Disp: 90 tablet, Rfl: 1   Melatonin 5 MG CAPS, Take 5 mg by mouth at bedtime. , Disp: , Rfl:    memantine (NAMENDA) 10 MG tablet, Take 10 mg by mouth 2 (two) times daily., Disp: , Rfl:    MULTIPLE VITAMIN PO, Take 1 tablet by mouth daily., Disp: , Rfl:    QUEtiapine (SEROQUEL) 100 MG tablet, Take 1 tablet by mouth in the morning, at noon, and at bedtime. Taking 100mg  am, afternoon, and pm, Disp: , Rfl:    rosuvastatin (CRESTOR) 20 MG tablet, Take 1 tablet (20 mg total) by mouth daily., Disp: 90 tablet, Rfl: 1   sertraline (ZOLOFT) 50 MG tablet, Take 50 mg by mouth every evening., Disp: , Rfl:    SitaGLIPtin-MetFORMIN HCl (JANUMET XR) 50-1000 MG TB24, Take 2  tablets by mouth daily. (Patient taking differently: Take 1 tablet by mouth 2 (two) times daily.), Disp: 180 tablet, Rfl: 1   VASCEPA 1 g capsule, Take 2 capsules (2 g total) by mouth 2 (two) times daily., Disp: 360 capsule, Rfl: 1  Allergies  Allergen Reactions   Ace Inhibitors  I personally reviewed active problem list, medication list, allergies, family history, social history, health maintenance with the patient/caregiver today.   ROS  Ten systems reviewed and is negative except as mentioned in HPI   Objective  Vitals:   01/04/23 1423  BP: 130/68  Pulse: 91  Resp: 16  Temp: 97.8 F (36.6 C)  TempSrc: Oral  SpO2: 98%  Weight: 142 lb 11.2 oz (64.7 kg)  Height: 5' (1.524 m)    Body mass index is 27.87 kg/m.  Physical Exam  Constitutional: Patient appears well-developed and well-nourished. Obese No distress.  HEENT: head atraumatic, normocephalic, pupils equal and reactive to light, neck supple, throat within normal limits Cardiovascular: Normal rate, regular rhythm and normal heart sounds.  No murmur heard. No BLE edema. Pulmonary/Chest: Effort normal and breath sounds normal. No respiratory distress. Abdominal: Soft.  There is no tenderness. Psychiatric: Patient has a normal mood and affect. behavior is normal.    Diabetic Foot Exam: Diabetic Foot Exam - Simple   Simple Foot Form Visual Inspection See comments: Yes Sensation Testing Intact to touch and monofilament testing bilaterally: Yes Pulse Check Posterior Tibialis and Dorsalis pulse intact bilaterally: Yes Comments Thick toenails      PHQ2/9:    01/04/2023    2:23 PM 11/21/2022    3:29 PM 09/05/2022   11:06 AM 08/22/2022    1:09 PM 05/08/2022   10:27 AM  Depression screen PHQ 2/9  Decreased Interest 0 0 0 0 1  Down, Depressed, Hopeless 0 0 0 0 0  PHQ - 2 Score 0 0 0 0 1  Altered sleeping 0 0 0 0 0  Tired, decreased energy 0 0 0 0 0  Change in appetite 0 0 0 0 0  Feeling bad or failure  about yourself  0 0 0 0 0  Trouble concentrating 0 0 0 0 0  Moving slowly or fidgety/restless 0 0 0 0 0  Suicidal thoughts 0 0 0 0 0  PHQ-9 Score 0 0 0 0 1  Difficult doing work/chores    Not difficult at all Not difficult at all    phq 9 is negative   Fall Risk:    01/04/2023    2:23 PM 11/21/2022    3:29 PM 09/05/2022   11:06 AM 08/22/2022    1:09 PM 05/08/2022   10:27 AM  Fall Risk   Falls in the past year? 1 1 1 1 1   Number falls in past yr: 1 1 1 1  0  Injury with Fall? 0 0 0 0 0  Risk for fall due to : History of fall(s) Impaired balance/gait;History of fall(s) Impaired balance/gait;History of fall(s) History of fall(s);Impaired mobility Impaired balance/gait  Follow up Falls prevention discussed;Education provided;Falls evaluation completed  Falls prevention discussed Falls prevention discussed;Education provided;Falls evaluation completed Falls prevention discussed;Education provided     Assessment & Plan  1. Type 2 diabetes mellitus with microalbuminuria, without long-term current use of insulin (HCC)  - POCT HgB A1C - Urine Microalbumin w/creat. ratio - HM Diabetes Foot Exam  2. Controlled gout  - Uric acid  3. Stage 3a chronic kidney disease (HCC)  - COMPLETE METABOLIC PANEL WITH GFR  4. Dyslipidemia  - Lipid panel  5. Benign essential HTN  - COMPLETE METABOLIC PANEL WITH GFR - CBC with Differential/Platelet  6. 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 History of iron deficiency anemia  - CBC with Differential/Platelet - Iron,  TIBC and Ferritin Panel  8. Vitamin D deficiency  - VITAMIN D 25 Hydroxy (Vit-D Deficiency, Fractures)  9. B12 deficiency  - Vitamin B12

## 2023-01-04 ENCOUNTER — Ambulatory Visit (INDEPENDENT_AMBULATORY_CARE_PROVIDER_SITE_OTHER): Payer: 59 | Admitting: Family Medicine

## 2023-01-04 ENCOUNTER — Encounter: Payer: Self-pay | Admitting: Family Medicine

## 2023-01-04 VITALS — BP 130/68 | HR 91 | Temp 97.8°F | Resp 16 | Ht 60.0 in | Wt 142.7 lb

## 2023-01-04 DIAGNOSIS — R809 Proteinuria, unspecified: Secondary | ICD-10-CM | POA: Diagnosis not present

## 2023-01-04 DIAGNOSIS — F0280 Dementia in other diseases classified elsewhere without behavioral disturbance: Secondary | ICD-10-CM | POA: Insufficient documentation

## 2023-01-04 DIAGNOSIS — E538 Deficiency of other specified B group vitamins: Secondary | ICD-10-CM

## 2023-01-04 DIAGNOSIS — E559 Vitamin D deficiency, unspecified: Secondary | ICD-10-CM

## 2023-01-04 DIAGNOSIS — E1129 Type 2 diabetes mellitus with other diabetic kidney complication: Secondary | ICD-10-CM

## 2023-01-04 DIAGNOSIS — I10 Essential (primary) hypertension: Secondary | ICD-10-CM | POA: Diagnosis not present

## 2023-01-04 DIAGNOSIS — E785 Hyperlipidemia, unspecified: Secondary | ICD-10-CM

## 2023-01-04 DIAGNOSIS — M109 Gout, unspecified: Secondary | ICD-10-CM

## 2023-01-04 DIAGNOSIS — N1831 Chronic kidney disease, stage 3a: Secondary | ICD-10-CM

## 2023-01-04 DIAGNOSIS — Z862 Personal history of diseases of the blood and blood-forming organs and certain disorders involving the immune mechanism: Secondary | ICD-10-CM

## 2023-01-04 LAB — POCT GLYCOSYLATED HEMOGLOBIN (HGB A1C): Hemoglobin A1C: 6.7 % — AB (ref 4.0–5.6)

## 2023-01-05 LAB — MICROALBUMIN / CREATININE URINE RATIO
Creatinine, Urine: 30 mg/dL (ref 20–275)
Microalb Creat Ratio: 20 mg/g creat (ref <30)
Microalb, Ur: 0.6 mg/dL

## 2023-01-05 LAB — CBC WITH DIFFERENTIAL/PLATELET
Absolute Monocytes: 456 cells/uL (ref 200–950)
Basophils Absolute: 60 cells/uL (ref 0–200)
Basophils Relative: 0.9 %
Eosinophils Absolute: 241 cells/uL (ref 15–500)
Eosinophils Relative: 3.6 %
HCT: 36.9 % (ref 35.0–45.0)
Hemoglobin: 12 g/dL (ref 11.7–15.5)
Lymphs Abs: 2714 cells/uL (ref 850–3900)
MCH: 28.3 pg (ref 27.0–33.0)
MCHC: 32.5 g/dL (ref 32.0–36.0)
MCV: 87 fL (ref 80.0–100.0)
MPV: 12.4 fL (ref 7.5–12.5)
Monocytes Relative: 6.8 %
Neutro Abs: 3229 cells/uL (ref 1500–7800)
Neutrophils Relative %: 48.2 %
Platelets: 194 10*3/uL (ref 140–400)
RBC: 4.24 10*6/uL (ref 3.80–5.10)
RDW: 14.3 % (ref 11.0–15.0)
Total Lymphocyte: 40.5 %
WBC: 6.7 10*3/uL (ref 3.8–10.8)

## 2023-01-05 LAB — URIC ACID: Uric Acid, Serum: 3.4 mg/dL (ref 2.5–7.0)

## 2023-01-05 LAB — LIPID PANEL
Cholesterol: 97 mg/dL (ref <200)
HDL: 44 mg/dL — ABNORMAL LOW (ref >=50)
LDL Cholesterol (Calc): 28 mg/dL (calc)
Non-HDL Cholesterol (Calc): 53 mg/dL (calc) (ref <130)
Total CHOL/HDL Ratio: 2.2 (calc) (ref <5.0)
Triglycerides: 175 mg/dL — ABNORMAL HIGH (ref <150)

## 2023-01-05 LAB — COMPLETE METABOLIC PANEL WITH GFR
AG Ratio: 1.9 (calc) (ref 1.0–2.5)
ALT: 24 U/L (ref 6–29)
AST: 28 U/L (ref 10–35)
Albumin: 4.3 g/dL (ref 3.6–5.1)
Alkaline phosphatase (APISO): 68 U/L (ref 37–153)
BUN/Creatinine Ratio: 13 (calc) (ref 6–22)
BUN: 15 mg/dL (ref 7–25)
CO2: 30 mmol/L (ref 20–32)
Calcium: 10.2 mg/dL (ref 8.6–10.4)
Chloride: 103 mmol/L (ref 98–110)
Creat: 1.19 mg/dL — ABNORMAL HIGH (ref 0.60–1.00)
Globulin: 2.3 g/dL (calc) (ref 1.9–3.7)
Glucose, Bld: 169 mg/dL — ABNORMAL HIGH (ref 65–99)
Potassium: 4 mmol/L (ref 3.5–5.3)
Sodium: 144 mmol/L (ref 135–146)
Total Bilirubin: 0.3 mg/dL (ref 0.2–1.2)
Total Protein: 6.6 g/dL (ref 6.1–8.1)
eGFR: 48 mL/min/{1.73_m2} — ABNORMAL LOW (ref >=60)

## 2023-01-05 LAB — VITAMIN B12: Vitamin B-12: 1817 pg/mL — ABNORMAL HIGH (ref 200–1100)

## 2023-01-05 LAB — IRON,TIBC AND FERRITIN PANEL
%SAT: 18 % (calc) (ref 16–45)
Ferritin: 20 ng/mL (ref 16–288)
Iron: 67 ug/dL (ref 45–160)
TIBC: 374 mcg/dL (calc) (ref 250–450)

## 2023-01-05 LAB — VITAMIN D 25 HYDROXY (VIT D DEFICIENCY, FRACTURES): Vit D, 25-Hydroxy: 64 ng/mL (ref 30–100)

## 2023-01-07 DIAGNOSIS — R413 Other amnesia: Secondary | ICD-10-CM | POA: Diagnosis not present

## 2023-01-07 DIAGNOSIS — R2689 Other abnormalities of gait and mobility: Secondary | ICD-10-CM | POA: Diagnosis not present

## 2023-01-07 DIAGNOSIS — F03B18 Unspecified dementia, moderate, with other behavioral disturbance: Secondary | ICD-10-CM | POA: Diagnosis not present

## 2023-01-07 DIAGNOSIS — E1136 Type 2 diabetes mellitus with diabetic cataract: Secondary | ICD-10-CM | POA: Diagnosis not present

## 2023-01-10 DIAGNOSIS — M8580 Other specified disorders of bone density and structure, unspecified site: Secondary | ICD-10-CM | POA: Diagnosis not present

## 2023-01-10 DIAGNOSIS — B351 Tinea unguium: Secondary | ICD-10-CM | POA: Diagnosis not present

## 2023-01-10 DIAGNOSIS — M109 Gout, unspecified: Secondary | ICD-10-CM | POA: Diagnosis not present

## 2023-01-10 DIAGNOSIS — J988 Other specified respiratory disorders: Secondary | ICD-10-CM | POA: Diagnosis not present

## 2023-01-10 DIAGNOSIS — M797 Fibromyalgia: Secondary | ICD-10-CM | POA: Diagnosis not present

## 2023-01-10 DIAGNOSIS — E785 Hyperlipidemia, unspecified: Secondary | ICD-10-CM | POA: Diagnosis not present

## 2023-01-10 DIAGNOSIS — D631 Anemia in chronic kidney disease: Secondary | ICD-10-CM | POA: Diagnosis not present

## 2023-01-10 DIAGNOSIS — Z79899 Other long term (current) drug therapy: Secondary | ICD-10-CM | POA: Diagnosis not present

## 2023-01-10 DIAGNOSIS — Z7984 Long term (current) use of oral hypoglycemic drugs: Secondary | ICD-10-CM | POA: Diagnosis not present

## 2023-01-10 DIAGNOSIS — N1831 Chronic kidney disease, stage 3a: Secondary | ICD-10-CM | POA: Diagnosis not present

## 2023-01-10 DIAGNOSIS — F03A18 Unspecified dementia, mild, with other behavioral disturbance: Secondary | ICD-10-CM | POA: Diagnosis not present

## 2023-01-10 DIAGNOSIS — G4709 Other insomnia: Secondary | ICD-10-CM | POA: Diagnosis not present

## 2023-01-10 DIAGNOSIS — E1122 Type 2 diabetes mellitus with diabetic chronic kidney disease: Secondary | ICD-10-CM | POA: Diagnosis not present

## 2023-01-10 DIAGNOSIS — E1136 Type 2 diabetes mellitus with diabetic cataract: Secondary | ICD-10-CM | POA: Diagnosis not present

## 2023-01-10 DIAGNOSIS — M810 Age-related osteoporosis without current pathological fracture: Secondary | ICD-10-CM | POA: Diagnosis not present

## 2023-01-10 DIAGNOSIS — F03A3 Unspecified dementia, mild, with mood disturbance: Secondary | ICD-10-CM | POA: Diagnosis not present

## 2023-01-10 DIAGNOSIS — I129 Hypertensive chronic kidney disease with stage 1 through stage 4 chronic kidney disease, or unspecified chronic kidney disease: Secondary | ICD-10-CM | POA: Diagnosis not present

## 2023-01-10 DIAGNOSIS — E441 Mild protein-calorie malnutrition: Secondary | ICD-10-CM | POA: Diagnosis not present

## 2023-01-10 DIAGNOSIS — C4491 Basal cell carcinoma of skin, unspecified: Secondary | ICD-10-CM | POA: Diagnosis not present

## 2023-01-24 DIAGNOSIS — I129 Hypertensive chronic kidney disease with stage 1 through stage 4 chronic kidney disease, or unspecified chronic kidney disease: Secondary | ICD-10-CM | POA: Diagnosis not present

## 2023-01-24 DIAGNOSIS — M109 Gout, unspecified: Secondary | ICD-10-CM | POA: Diagnosis not present

## 2023-01-24 DIAGNOSIS — Z79899 Other long term (current) drug therapy: Secondary | ICD-10-CM | POA: Diagnosis not present

## 2023-01-24 DIAGNOSIS — D631 Anemia in chronic kidney disease: Secondary | ICD-10-CM | POA: Diagnosis not present

## 2023-01-24 DIAGNOSIS — M810 Age-related osteoporosis without current pathological fracture: Secondary | ICD-10-CM | POA: Diagnosis not present

## 2023-01-24 DIAGNOSIS — E1122 Type 2 diabetes mellitus with diabetic chronic kidney disease: Secondary | ICD-10-CM | POA: Diagnosis not present

## 2023-01-24 DIAGNOSIS — C4491 Basal cell carcinoma of skin, unspecified: Secondary | ICD-10-CM | POA: Diagnosis not present

## 2023-01-24 DIAGNOSIS — G4709 Other insomnia: Secondary | ICD-10-CM | POA: Diagnosis not present

## 2023-01-24 DIAGNOSIS — N1831 Chronic kidney disease, stage 3a: Secondary | ICD-10-CM | POA: Diagnosis not present

## 2023-01-24 DIAGNOSIS — E1136 Type 2 diabetes mellitus with diabetic cataract: Secondary | ICD-10-CM | POA: Diagnosis not present

## 2023-01-24 DIAGNOSIS — M8580 Other specified disorders of bone density and structure, unspecified site: Secondary | ICD-10-CM | POA: Diagnosis not present

## 2023-01-24 DIAGNOSIS — M797 Fibromyalgia: Secondary | ICD-10-CM | POA: Diagnosis not present

## 2023-01-24 DIAGNOSIS — E441 Mild protein-calorie malnutrition: Secondary | ICD-10-CM | POA: Diagnosis not present

## 2023-01-24 DIAGNOSIS — B351 Tinea unguium: Secondary | ICD-10-CM | POA: Diagnosis not present

## 2023-01-24 DIAGNOSIS — Z7984 Long term (current) use of oral hypoglycemic drugs: Secondary | ICD-10-CM | POA: Diagnosis not present

## 2023-01-24 DIAGNOSIS — E785 Hyperlipidemia, unspecified: Secondary | ICD-10-CM | POA: Diagnosis not present

## 2023-01-24 DIAGNOSIS — F03A3 Unspecified dementia, mild, with mood disturbance: Secondary | ICD-10-CM | POA: Diagnosis not present

## 2023-01-24 DIAGNOSIS — J988 Other specified respiratory disorders: Secondary | ICD-10-CM | POA: Diagnosis not present

## 2023-01-24 DIAGNOSIS — F03A18 Unspecified dementia, mild, with other behavioral disturbance: Secondary | ICD-10-CM | POA: Diagnosis not present

## 2023-01-25 ENCOUNTER — Telehealth: Payer: Self-pay | Admitting: Family Medicine

## 2023-01-25 NOTE — Telephone Encounter (Signed)
Pervis Hocking from [facility] PT called to report that the patient has been discharged with goals met.   Best contact [phone]

## 2023-02-11 DIAGNOSIS — H2513 Age-related nuclear cataract, bilateral: Secondary | ICD-10-CM | POA: Diagnosis not present

## 2023-02-11 DIAGNOSIS — E119 Type 2 diabetes mellitus without complications: Secondary | ICD-10-CM | POA: Diagnosis not present

## 2023-02-11 LAB — HM DIABETES EYE EXAM

## 2023-03-06 DIAGNOSIS — F03B0 Unspecified dementia, moderate, without behavioral disturbance, psychotic disturbance, mood disturbance, and anxiety: Secondary | ICD-10-CM | POA: Diagnosis not present

## 2023-03-19 LAB — HM DEXA SCAN

## 2023-03-19 LAB — HM DIABETES FOOT EXAM: HM Diabetic Foot Exam: NORMAL

## 2023-05-01 ENCOUNTER — Other Ambulatory Visit: Payer: Self-pay | Admitting: Family Medicine

## 2023-05-01 DIAGNOSIS — M109 Gout, unspecified: Secondary | ICD-10-CM

## 2023-05-01 DIAGNOSIS — E1129 Type 2 diabetes mellitus with other diabetic kidney complication: Secondary | ICD-10-CM

## 2023-05-03 NOTE — Progress Notes (Unsigned)
Name: Lindsey [name]   MRN: [id number]    DOB: [birth year]   Date:05/06/2023       Progress Note  Subjective  Chief Complaint  Follow Up  HPI  DMII:   HgbA1C today is 7.2 %   She has an aid coming to her house daily for breakfast , she also has an aid during dinner time and when she lost a lot of weight a family member was going during the middle of the day to give her food.    She denies polyphagia, polydipsia or polyuria. No recent episodes of hypoglycemia  She has obesity , CKI stage III and dyslipidemia. She is on statin therapy and ARB.    Auditory Hallucination/Dementia: seen by Dr. Malvin Johns and also  by  Dr. Chrys Racer - geriatrician, who is living the practice soon . She is still taking Seroquel  100 mg TID  and 10 mg of  Aricept, and Namenda twice daily and zoloft as prescribed. Daughter states she is doing well lately, she wakes up during the night but mood has been steady and no longer seems to have hallucinations    HTN:  She denies chest pain, palpitation or dizziness. BP today is towards low end of normal, it was also low during recent visit to [facility]. We will try going down on Carvedilol to 6.25 mg and she will return in 6 weeks for BP check only    Hyperlipidemia: taking Crestor and denies side effects of medication, including no muscle ache LDL has been at goal    Major Depression in remission: daughter does not think she is depressed but is anxious, gets frustrated for not remembering things at times but is stable   She still lives alone, daughter manages  her bills, doctors visits, and she also dispenses her medications, she has an Aid that spends the mornings with her, family pays for an aid to go at night to feed and bathe her but she is able to sleep on her own , she has been getting out of bed during the night and goes to sleep on her sofa. Daughter is able to watch her on camera   She forgets to eat or picks at her food,  but weight is trending up again  She needs assistance  bathing, dressing , taking medications, no longer cooks or cleans. She is unable to use microwave or even open pre package meals by herself , she needs 24 hour care and family , she currently got approved for 3-4 hours per day with her insurance, she is having PT also   Urine odor: noticed this am but her aid, no dysuria, fever and in our office normal behavior . UA negative   Patient Active Problem List   Diagnosis Date Noted   Dementia due to medical condition without behavioral disturbance (HCC) 01/04/2023   RSV infection 11/15/2022   Mild protein-calorie malnutrition (HCC) 05/08/2022   Stage 3a chronic kidney disease (HCC) 05/08/2022   Type 2 diabetes mellitus with microalbuminuria, without long-term current use of insulin (HCC) 05/08/2022   Pain due to onychomycosis of toenails of both feet 05/04/2019   Dementia with behavioral disturbance (HCC) 01/09/2016   Mild dementia (HCC) 11/01/2015   Auditory hallucination 07/26/2015   Anemia of chronic disease 07/05/2015   Cataract 04/06/2015   Type 2 diabetes mellitus with diabetic cataract (HCC) 04/06/2015   Benign essential HTN 04/02/2015   Controlled gout 04/02/2015   Dyslipidemia 04/02/2015   Fibromyalgia syndrome 04/02/2015  Chronic insomnia 04/02/2015   Cognitive decline 04/02/2015   Nodular basal cell carcinoma 04/02/2015   Osteopenia 04/02/2015   Calcium blood increased 04/02/2015    Past Surgical History:  Procedure Laterality Date   CESAREAN SECTION     TONSILECTOMY, ADENOIDECTOMY, BILATERAL MYRINGOTOMY AND TUBES     TUBAL LIGATION      Family History  Problem Relation Age of Onset   Hypertension Mother    Osteoporosis Mother    Cancer Mother        brain tumor   Hodgkin's lymphoma Father    Hypertension Sister    Cancer Sister        unknown   Hypertension Sister    Diabetes Sister    Cancer Sister 99       breast   Breast cancer Sister 19   Cancer Maternal Uncle     Social History   Tobacco Use    Smoking status: Never   Smokeless tobacco: Never   Tobacco comments:    smoking cessation materials not required  Substance Use Topics   Alcohol use: No     Current Outpatient Medications:    allopurinol (ZYLOPRIM) 100 MG tablet, Take 1 tablet by mouth twice daily, Disp: 180 tablet, Rfl: 0   cholecalciferol (VITAMIN D3) 25 MCG (1000 UNIT) tablet, Take 1 tablet (1,000 Units total) by mouth daily., Disp: , Rfl:    cyanocobalamin (VITAMIN B12) 1000 MCG tablet, Take 1,000 mcg by mouth daily., Disp: , Rfl:    donepezil (ARICEPT) 10 MG tablet, Take 1 tablet (10 mg total) by mouth at bedtime. (Patient taking differently: Take 10 mg by mouth daily. In the morning), Disp: 90 tablet, Rfl: 1   GLUCOSE BLOOD VI, , Disp: , Rfl:    JARDIANCE 25 MG TABS tablet, Take 1 tablet by mouth once daily, Disp: 90 tablet, Rfl: 0   losartan (COZAAR) 25 MG tablet, Take 1 tablet by mouth once daily, Disp: 90 tablet, Rfl: 0   Melatonin 5 MG CAPS, Take 5 mg by mouth at bedtime. , Disp: , Rfl:    memantine (NAMENDA) 10 MG tablet, Take 10 mg by mouth 2 (two) times daily., Disp: , Rfl:    MULTIPLE VITAMIN PO, Take 1 tablet by mouth daily., Disp: , Rfl:    QUEtiapine (SEROQUEL) 100 MG tablet, Take 1 tablet by mouth in the morning, at noon, and at bedtime. Taking 100mg  am, afternoon, and pm, Disp: , Rfl:    rosuvastatin (CRESTOR) 20 MG tablet, Take 1 tablet (20 mg total) by mouth daily., Disp: 90 tablet, Rfl: 1   sertraline (ZOLOFT) 50 MG tablet, Take 50 mg by mouth every evening., Disp: , Rfl:    SitaGLIPtin-MetFORMIN HCl (JANUMET XR) 50-1000 MG TB24, Take 1 tablet by mouth 2 (two) times daily., Disp: 180 tablet, Rfl: 1   VASCEPA 1 g capsule, Take 2 capsules (2 g total) by mouth 2 (two) times daily., Disp: 360 capsule, Rfl: 1   carvedilol (COREG) 6.25 MG tablet, Take 1 tablet (6.25 mg total) by mouth 2 (two) times daily with a meal., Disp: 180 tablet, Rfl: 0  Allergies  Allergen Reactions   Ace Inhibitors     I  personally reviewed active problem list, medication list, allergies, family history, social history, health maintenance with the patient/caregiver today.   ROS  Ten systems reviewed and is negative except as mentioned in HPI    Objective  Vitals:   05/06/23 1131  BP: 114/70  Pulse: 93  Resp:  14  Temp: (!) 97.5 F (36.4 C)  TempSrc: Oral  SpO2: 95%  Weight: 149 lb 6.4 oz (67.8 kg)  Height: 5' (1.524 m)    Body mass index is 29.18 kg/m.  Physical Exam  Constitutional: Patient appears well-developed and well-nourished.  HEENT: head atraumatic, normocephalic, pupils equal and reactive to light, neck supple, throat within normal limits Cardiovascular: Normal rate, regular rhythm and normal heart sounds.  No murmur heard. No BLE edema. Pulmonary/Chest: Effort normal and breath sounds normal. No respiratory distress. Abdominal: Soft.  There is no tenderness. Psychiatric: Quiet, looks to her daughter for answers, calm and cooperative Neuro: Short shuffling gait   Recent Results (from the past 2160 hour(s))  HM DIABETES EYE EXAM     Status: None   Collection Time: 02/11/23 12:00 AM  Result Value Ref Range   HM Diabetic Eye Exam No Retinopathy No Retinopathy    Comment: [facility]  HM DIABETES FOOT EXAM     Status: None   Collection Time: 03/19/23 12:00 AM  Result Value Ref Range   HM Diabetic Foot Exam Normal   HM DEXA SCAN     Status: None   Collection Time: 03/19/23 12:00 AM  Result Value Ref Range   HM Dexa Scan See Report in chart   POCT Urinalysis Dip Manual     Status: Abnormal   Collection Time: 05/06/23 11:48 AM  Result Value Ref Range   Spec Grav, UA <=1.005 (A) 1.010 - 1.025   pH, UA 5.0 5.0 - 8.0   Leukocytes, UA Negative Negative   Nitrite, UA Negative Negative   Poct Protein Negative Negative, trace mg/dL   Poct Glucose =1610 (A) Normal mg/dL   Poct Ketones Negative Negative   Poct Urobilinogen Normal Normal mg/dL   Poct Bilirubin Negative  Negative   Poct Blood Negative Negative, trace  POCT HgB A1C     Status: Abnormal   Collection Time: 05/06/23 11:49 AM  Result Value Ref Range   Hemoglobin A1C 7.2 (A) 4.0 - 5.6 %   HbA1c POC (<> result, manual entry)     HbA1c, POC (prediabetic range)     HbA1c, POC (controlled diabetic range)      PHQ2/9:    05/06/2023   11:46 AM 01/04/2023    2:23 PM 11/21/2022    3:29 PM 09/05/2022   11:06 AM 08/22/2022    1:09 PM  Depression screen PHQ 2/9  Decreased Interest 0 0 0 0 0  Down, Depressed, Hopeless 0 0 0 0 0  PHQ - 2 Score 0 0 0 0 0  Altered sleeping 0 0 0 0 0  Tired, decreased energy 0 0 0 0 0  Change in appetite 0 0 0 0 0  Feeling bad or failure about yourself  0 0 0 0 0  Trouble concentrating 0 0 0 0 0  Moving slowly or fidgety/restless 0 0 0 0 0  Suicidal thoughts 0 0 0 0 0  PHQ-9 Score 0 0 0 0 0  Difficult doing work/chores     Not difficult at all    phq 9 is negative   Fall Risk:    05/06/2023   11:46 AM 01/04/2023    2:23 PM 11/21/2022    3:29 PM 09/05/2022   11:06 AM 08/22/2022    1:09 PM  Fall Risk   Falls in the past year? 0 1 1 1 1   Number falls in past yr:  1 1 1 1   Injury  with Fall?  0 0 0 0  Risk for fall due to : No Fall Risks History of fall(s) Impaired balance/gait;History of fall(s) Impaired balance/gait;History of fall(s) History of fall(s);Impaired mobility  Follow up Falls prevention discussed;Education provided;Falls evaluation completed Falls prevention discussed;Education provided;Falls evaluation completed  Falls prevention discussed Falls prevention discussed;Education provided;Falls evaluation completed      Functional Status Survey: Is the patient deaf or have difficulty hearing?: No Does the patient have difficulty seeing, even when wearing glasses/contacts?: No Does the patient have difficulty concentrating, remembering, or making decisions?: Yes Does the patient have difficulty walking or climbing stairs?: No Does the patient have  difficulty dressing or bathing?: Yes Does the patient have difficulty doing errands alone such as visiting a doctor's office or shopping?: Yes    Assessment & Plan  1. Type 2 diabetes mellitus with microalbuminuria, without long-term current use of insulin (HCC)  - POCT HgB A1C - at goal for her   2. Stage 3a chronic kidney disease (HCC)  Recheck labs next visit, stable  3. Dementia with behavioral disturbance (HCC)  Stable, keep follow up with neurologist   4. Controlled gout  Last uric acid was normal   5. History of iron deficiency anemia  Resume ferrous sulfate 325 mg once a day   6. B12 deficiency  Back down on supplements to a few times a week   7. Dyslipidemia  On statin therapy   8. Abnormal urine odor  - POCT Urinalysis Dip Manual  9. Benign essential HTN  - carvedilol (COREG) 6.25 MG tablet; Take 1 tablet (6.25 mg total) by mouth 2 (two) times daily with a meal.  Dispense: 180 tablet; Refill: 0

## 2023-05-06 ENCOUNTER — Encounter: Payer: Self-pay | Admitting: Family Medicine

## 2023-05-06 ENCOUNTER — Ambulatory Visit (INDEPENDENT_AMBULATORY_CARE_PROVIDER_SITE_OTHER): Payer: 59 | Admitting: Family Medicine

## 2023-05-06 VITALS — BP 114/70 | HR 93 | Temp 97.5°F | Resp 14 | Ht 60.0 in | Wt 149.4 lb

## 2023-05-06 DIAGNOSIS — R809 Proteinuria, unspecified: Secondary | ICD-10-CM

## 2023-05-06 DIAGNOSIS — I10 Essential (primary) hypertension: Secondary | ICD-10-CM | POA: Diagnosis not present

## 2023-05-06 DIAGNOSIS — Z862 Personal history of diseases of the blood and blood-forming organs and certain disorders involving the immune mechanism: Secondary | ICD-10-CM

## 2023-05-06 DIAGNOSIS — E785 Hyperlipidemia, unspecified: Secondary | ICD-10-CM

## 2023-05-06 DIAGNOSIS — M109 Gout, unspecified: Secondary | ICD-10-CM | POA: Diagnosis not present

## 2023-05-06 DIAGNOSIS — E1129 Type 2 diabetes mellitus with other diabetic kidney complication: Secondary | ICD-10-CM | POA: Diagnosis not present

## 2023-05-06 DIAGNOSIS — E538 Deficiency of other specified B group vitamins: Secondary | ICD-10-CM

## 2023-05-06 DIAGNOSIS — F03918 Unspecified dementia, unspecified severity, with other behavioral disturbance: Secondary | ICD-10-CM | POA: Diagnosis not present

## 2023-05-06 DIAGNOSIS — R8290 Unspecified abnormal findings in urine: Secondary | ICD-10-CM

## 2023-05-06 DIAGNOSIS — N1831 Chronic kidney disease, stage 3a: Secondary | ICD-10-CM | POA: Diagnosis not present

## 2023-05-06 LAB — POCT URINALYSIS DIPSTICK (MANUAL)
Leukocytes, UA: NEGATIVE
Nitrite, UA: NEGATIVE
Poct Bilirubin: NEGATIVE
Poct Blood: NEGATIVE
Poct Glucose: 1000 mg/dL — AB
Poct Ketones: NEGATIVE
Poct Protein: NEGATIVE mg/dL
Poct Urobilinogen: NORMAL mg/dL
Spec Grav, UA: <=1.005 — AB (ref 1.010–1.025)
pH, UA: 5 (ref 5.0–8.0)

## 2023-05-06 LAB — POCT GLYCOSYLATED HEMOGLOBIN (HGB A1C): Hemoglobin A1C: 7.2 % — AB (ref 4.0–5.6)

## 2023-05-06 MED ORDER — CARVEDILOL 6.25 MG PO TABS
6.2500 mg | ORAL_TABLET | Freq: Two times a day (BID) | ORAL | 0 refills | Status: DC
Start: 2023-05-06 — End: 2023-05-30

## 2023-05-11 ENCOUNTER — Other Ambulatory Visit: Payer: Self-pay | Admitting: Family Medicine

## 2023-05-11 DIAGNOSIS — I10 Essential (primary) hypertension: Secondary | ICD-10-CM

## 2023-05-13 ENCOUNTER — Other Ambulatory Visit: Payer: Self-pay

## 2023-05-13 DIAGNOSIS — I10 Essential (primary) hypertension: Secondary | ICD-10-CM

## 2023-05-13 MED ORDER — VASCEPA 1 G PO CAPS: 2.0000 g | ORAL_CAPSULE | Freq: Two times a day (BID) | ORAL | 1 refills | Status: DC

## 2023-05-29 NOTE — Progress Notes (Unsigned)
Name: Lindsey [name]   MRN: [id number]    DOB: 02/01/1951   Date:05/30/2023       Progress Note  Subjective  Chief Complaint  Follow Up  HPI  HTN:  She denies chest pain, palpitation or dizziness. BP was towards low end of normal at 114/70 and it was also low during  visit [facility].  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 behavioral disturbance (HCC) 01/04/2023   RSV infection 11/15/2022   Mild protein-calorie malnutrition (HCC) 05/08/2022   Stage 3a chronic kidney disease (HCC) 05/08/2022   Type 2 diabetes mellitus with microalbuminuria, without long-term current use of insulin (HCC) 05/08/2022   Pain due to onychomycosis of toenails of both feet 05/04/2019   Dementia with behavioral disturbance (HCC) 01/09/2016   Mild dementia (HCC) 11/01/2015   Auditory hallucination 07/26/2015   Anemia of chronic disease 07/05/2015   Cataract 04/06/2015   Type 2 diabetes mellitus with diabetic cataract (HCC) 04/06/2015   Benign essential HTN 04/02/2015   Controlled gout 04/02/2015   Dyslipidemia 04/02/2015   Fibromyalgia syndrome 04/02/2015   Chronic insomnia 04/02/2015   Cognitive decline 04/02/2015   Nodular basal cell carcinoma 04/02/2015   Osteopenia 04/02/2015   Calcium blood increased 04/02/2015    Past Surgical History:  Procedure Laterality Date   CESAREAN SECTION     TONSILECTOMY, ADENOIDECTOMY, BILATERAL MYRINGOTOMY AND TUBES     TUBAL LIGATION      Family History  Problem Relation Age of Onset   Hypertension Mother    Osteoporosis Mother    Cancer Mother        brain tumor   Hodgkin's lymphoma Father    Hypertension Sister    Cancer Sister        unknown   Hypertension Sister    Diabetes Sister    Cancer Sister 16       breast   Breast cancer Sister 38   Cancer Maternal Uncle     Social History   Tobacco  Use   Smoking status: Never   Smokeless tobacco: Never   Tobacco comments:    smoking cessation materials not required  Substance Use Topics   Alcohol use: No     Current Outpatient Medications:    allopurinol (ZYLOPRIM) 100 MG tablet, Take 1 tablet by mouth twice daily, Disp: 180 tablet, Rfl: 0   carvedilol (COREG) 6.25 MG tablet, Take 1 tablet (6.25 mg total) by mouth 2 (two) times daily with a meal., Disp: 180 tablet, Rfl: 0   cholecalciferol (VITAMIN D3) 25 MCG (1000 UNIT) tablet, Take 1 tablet (1,000 Units total) by mouth daily., Disp: , Rfl:    cyanocobalamin (VITAMIN B12) 1000 MCG tablet, Take 1,000 mcg by mouth daily., Disp: , Rfl:    donepezil (ARICEPT) 10 MG tablet, Take 1 tablet (10 mg total) by mouth at bedtime. (Patient taking differently: Take 10 mg by mouth daily. In the morning), Disp: 90 tablet, Rfl: 1   GLUCOSE BLOOD VI, , Disp: , Rfl:    JARDIANCE 25 MG TABS tablet, Take 1 tablet by mouth once daily, Disp: 90 tablet, Rfl: 0   losartan (COZAAR) 25 MG tablet, Take 1 tablet by mouth once daily, Disp: 90 tablet, Rfl: 0   Melatonin 5 MG CAPS, Take 5 mg by mouth at  bedtime. , Disp: , Rfl:    memantine (NAMENDA) 10 MG tablet, Take 10 mg by mouth 2 (two) times daily., Disp: , Rfl:    MULTIPLE VITAMIN PO, Take 1 tablet by mouth daily., Disp: , Rfl:    QUEtiapine (SEROQUEL) 100 MG tablet, Take 1 tablet by mouth in the morning, at noon, and at bedtime. Taking 100mg  am, afternoon, and pm, Disp: , Rfl:    rosuvastatin (CRESTOR) 20 MG tablet, Take 1 tablet (20 mg total) by mouth daily., Disp: 90 tablet, Rfl: 1   sertraline (ZOLOFT) 50 MG tablet, Take 50 mg by mouth every evening., Disp: , Rfl:    SitaGLIPtin-MetFORMIN HCl (JANUMET XR) 50-1000 MG TB24, Take 1 tablet by mouth 2 (two) times daily., Disp: 180 tablet, Rfl: 1   VASCEPA 1 g capsule, Take 2 capsules (2 g total) by mouth 2 (two) times daily., Disp: 360 capsule, Rfl: 1  Allergies  Allergen Reactions   Ace Inhibitors     I  personally reviewed active problem list, medication list, allergies, family history, social history, health maintenance with the patient/caregiver today.   ROS  Ten systems reviewed and is negative except as mentioned in HPI    Objective  Vitals:   05/30/23 1119  BP: 122/68  Pulse: 87  Resp: 16  SpO2: 98%  Weight: 148 lb (67.1 kg)  Height: 5' (1.524 m)    Body mass index is 28.9 kg/m.  Physical Exam  Constitutional: Patient appears well-developed and well-nourished. Obese  No distress.  HEENT: head atraumatic, normocephalic, pupils equal and reactive to light, neck supple Cardiovascular: Normal rate, regular rhythm and normal heart sounds.  No murmur heard. No BLE edema. Pulmonary/Chest: Effort normal and breath sounds normal. No respiratory distress. Abdominal: Soft.  There is no tenderness. Psychiatric: Patient has a normal mood and affect. behavior is normal. Judgment and thought content normal.    PHQ2/9:    05/30/2023   11:18 AM 05/06/2023   11:46 AM 01/04/2023    2:23 PM 11/21/2022    3:29 PM 09/05/2022   11:06 AM  Depression screen PHQ 2/9  Decreased Interest 0 0 0 0 0  Down, Depressed, Hopeless 0 0 0 0 0  PHQ - 2 Score 0 0 0 0 0  Altered sleeping 0 0 0 0 0  Tired, decreased energy 0 0 0 0 0  Change in appetite 0 0 0 0 0  Feeling bad or failure about yourself  0 0 0 0 0  Trouble concentrating 0 0 0 0 0  Moving slowly or fidgety/restless 0 0 0 0 0  Suicidal thoughts 0 0 0 0 0  PHQ-9 Score 0 0 0 0 0    phq 9 is negative   Fall Risk:    05/30/2023   11:18 AM 05/06/2023   11:46 AM 01/04/2023    2:23 PM 11/21/2022    3:29 PM 09/05/2022   11:06 AM  Fall Risk   Falls in the past year? 0 0 1 1 1   Number falls in past yr: 0  1 1 1   Injury with Fall? 0  0 0 0  Risk for fall due to : No Fall Risks No Fall Risks History of fall(s) Impaired balance/gait;History of fall(s) Impaired balance/gait;History of fall(s)  Follow up Falls prevention discussed Falls  prevention discussed;Education provided;Falls evaluation completed Falls prevention discussed;Education provided;Falls evaluation completed  Falls prevention discussed      Functional Status Survey: Is the patient deaf or have difficulty hearing?:  No Does the patient have difficulty seeing, even when wearing glasses/contacts?: No Does the patient have difficulty concentrating, remembering, or making decisions?: Yes Does the patient have difficulty walking or climbing stairs?: No Does the patient have difficulty dressing or bathing?: Yes Does the patient have difficulty doing errands alone such as visiting a doctor's office or shopping?: Yes    Assessment & Plan  1. Benign essential HTN  - carvedilol (COREG) 6.25 MG tablet; Take 1 tablet (6.25 mg total) by mouth 2 (two) times daily with a meal.  Dispense: 180 tablet; Refill: 0

## 2023-05-30 ENCOUNTER — Ambulatory Visit (INDEPENDENT_AMBULATORY_CARE_PROVIDER_SITE_OTHER): Payer: 59 | Admitting: Family Medicine

## 2023-05-30 ENCOUNTER — Encounter: Payer: Self-pay | Admitting: Family Medicine

## 2023-05-30 VITALS — BP 122/68 | HR 87 | Resp 16 | Ht 60.0 in | Wt 148.0 lb

## 2023-05-30 DIAGNOSIS — I10 Essential (primary) hypertension: Secondary | ICD-10-CM

## 2023-05-30 MED ORDER — CARVEDILOL 6.25 MG PO TABS
6.2500 mg | ORAL_TABLET | Freq: Two times a day (BID) | ORAL | 0 refills | Status: DC
Start: 2023-05-30 — End: 2023-09-06

## 2023-07-01 ENCOUNTER — Encounter: Payer: Self-pay | Admitting: Family Medicine

## 2023-07-09 ENCOUNTER — Other Ambulatory Visit: Payer: Self-pay | Admitting: Nurse Practitioner

## 2023-07-09 DIAGNOSIS — E1129 Type 2 diabetes mellitus with other diabetic kidney complication: Secondary | ICD-10-CM

## 2023-07-09 MED ORDER — SITAGLIPTIN PHOSPHATE 50 MG PO TABS: 50.0000 mg | ORAL_TABLET | Freq: Every day | ORAL | 0 refills | Status: DC

## 2023-07-09 MED ORDER — METFORMIN HCL ER 500 MG PO TB24
1000.0000 mg | ORAL_TABLET | Freq: Two times a day (BID) | ORAL | 0 refills | Status: DC
Start: 2023-07-09 — End: 2023-09-06

## 2023-07-10 DIAGNOSIS — R413 Other amnesia: Secondary | ICD-10-CM | POA: Diagnosis not present

## 2023-07-10 DIAGNOSIS — R2689 Other abnormalities of gait and mobility: Secondary | ICD-10-CM | POA: Diagnosis not present

## 2023-07-29 ENCOUNTER — Other Ambulatory Visit: Payer: Self-pay | Admitting: Family Medicine

## 2023-07-29 DIAGNOSIS — M109 Gout, unspecified: Secondary | ICD-10-CM

## 2023-07-29 DIAGNOSIS — E1129 Type 2 diabetes mellitus with other diabetic kidney complication: Secondary | ICD-10-CM

## 2023-08-04 ENCOUNTER — Other Ambulatory Visit: Payer: Self-pay | Admitting: Nurse Practitioner

## 2023-08-04 DIAGNOSIS — E1129 Type 2 diabetes mellitus with other diabetic kidney complication: Secondary | ICD-10-CM

## 2023-08-05 ENCOUNTER — Observation Stay: Payer: 59

## 2023-08-05 ENCOUNTER — Emergency Department: Payer: 59

## 2023-08-05 ENCOUNTER — Other Ambulatory Visit: Payer: Self-pay

## 2023-08-05 ENCOUNTER — Inpatient Hospital Stay
Admission: EM | Admit: 2023-08-05 | Discharge: 2023-08-16 | DRG: 640 | Disposition: A | Discharge status: Skilled Nursing Facility | Payer: 59 | Attending: Internal Medicine | Admitting: Internal Medicine

## 2023-08-05 DIAGNOSIS — R000 Tachycardia, unspecified: Secondary | ICD-10-CM | POA: Diagnosis present

## 2023-08-05 DIAGNOSIS — K5900 Constipation, unspecified: Secondary | ICD-10-CM | POA: Diagnosis not present

## 2023-08-05 DIAGNOSIS — E538 Deficiency of other specified B group vitamins: Secondary | ICD-10-CM | POA: Diagnosis present

## 2023-08-05 DIAGNOSIS — G929 Unspecified toxic encephalopathy: Secondary | ICD-10-CM | POA: Diagnosis not present

## 2023-08-05 DIAGNOSIS — N179 Acute kidney failure, unspecified: Secondary | ICD-10-CM | POA: Diagnosis not present

## 2023-08-05 DIAGNOSIS — E869 Volume depletion, unspecified: Secondary | ICD-10-CM | POA: Diagnosis not present

## 2023-08-05 DIAGNOSIS — E669 Obesity, unspecified: Secondary | ICD-10-CM | POA: Diagnosis present

## 2023-08-05 DIAGNOSIS — R809 Proteinuria, unspecified: Secondary | ICD-10-CM | POA: Diagnosis not present

## 2023-08-05 DIAGNOSIS — R1311 Dysphagia, oral phase: Secondary | ICD-10-CM | POA: Diagnosis not present

## 2023-08-05 DIAGNOSIS — R627 Adult failure to thrive: Secondary | ICD-10-CM | POA: Diagnosis present

## 2023-08-05 DIAGNOSIS — Z1152 Encounter for screening for COVID-19: Secondary | ICD-10-CM

## 2023-08-05 DIAGNOSIS — E785 Hyperlipidemia, unspecified: Secondary | ICD-10-CM | POA: Diagnosis not present

## 2023-08-05 DIAGNOSIS — Z8249 Family history of ischemic heart disease and other diseases of the circulatory system: Secondary | ICD-10-CM | POA: Diagnosis not present

## 2023-08-05 DIAGNOSIS — R4701 Aphasia: Secondary | ICD-10-CM | POA: Diagnosis not present

## 2023-08-05 DIAGNOSIS — E11319 Type 2 diabetes mellitus with unspecified diabetic retinopathy without macular edema: Secondary | ICD-10-CM | POA: Diagnosis present

## 2023-08-05 DIAGNOSIS — G8929 Other chronic pain: Secondary | ICD-10-CM | POA: Diagnosis present

## 2023-08-05 DIAGNOSIS — R531 Weakness: Secondary | ICD-10-CM | POA: Diagnosis not present

## 2023-08-05 DIAGNOSIS — F419 Anxiety disorder, unspecified: Secondary | ICD-10-CM | POA: Diagnosis present

## 2023-08-05 DIAGNOSIS — E1129 Type 2 diabetes mellitus with other diabetic kidney complication: Secondary | ICD-10-CM | POA: Diagnosis not present

## 2023-08-05 DIAGNOSIS — W07XXXA Fall from chair, initial encounter: Secondary | ICD-10-CM | POA: Diagnosis present

## 2023-08-05 DIAGNOSIS — R9089 Other abnormal findings on diagnostic imaging of central nervous system: Secondary | ICD-10-CM | POA: Diagnosis not present

## 2023-08-05 DIAGNOSIS — M797 Fibromyalgia: Secondary | ICD-10-CM | POA: Diagnosis not present

## 2023-08-05 DIAGNOSIS — E1165 Type 2 diabetes mellitus with hyperglycemia: Secondary | ICD-10-CM | POA: Diagnosis not present

## 2023-08-05 DIAGNOSIS — I129 Hypertensive chronic kidney disease with stage 1 through stage 4 chronic kidney disease, or unspecified chronic kidney disease: Secondary | ICD-10-CM | POA: Diagnosis not present

## 2023-08-05 DIAGNOSIS — F05 Delirium due to known physiological condition: Secondary | ICD-10-CM | POA: Diagnosis not present

## 2023-08-05 DIAGNOSIS — Z833 Family history of diabetes mellitus: Secondary | ICD-10-CM

## 2023-08-05 DIAGNOSIS — W19XXXD Unspecified fall, subsequent encounter: Secondary | ICD-10-CM

## 2023-08-05 DIAGNOSIS — Z8262 Family history of osteoporosis: Secondary | ICD-10-CM

## 2023-08-05 DIAGNOSIS — N390 Urinary tract infection, site not specified: Secondary | ICD-10-CM | POA: Diagnosis not present

## 2023-08-05 DIAGNOSIS — R404 Transient alteration of awareness: Secondary | ICD-10-CM | POA: Diagnosis not present

## 2023-08-05 DIAGNOSIS — Z043 Encounter for examination and observation following other accident: Secondary | ICD-10-CM | POA: Diagnosis not present

## 2023-08-05 DIAGNOSIS — Z85828 Personal history of other malignant neoplasm of skin: Secondary | ICD-10-CM

## 2023-08-05 DIAGNOSIS — N1831 Chronic kidney disease, stage 3a: Secondary | ICD-10-CM | POA: Diagnosis not present

## 2023-08-05 DIAGNOSIS — R262 Difficulty in walking, not elsewhere classified: Secondary | ICD-10-CM | POA: Diagnosis present

## 2023-08-05 DIAGNOSIS — Z803 Family history of malignant neoplasm of breast: Secondary | ICD-10-CM

## 2023-08-05 DIAGNOSIS — I6782 Cerebral ischemia: Secondary | ICD-10-CM | POA: Diagnosis not present

## 2023-08-05 DIAGNOSIS — Z79899 Other long term (current) drug therapy: Secondary | ICD-10-CM

## 2023-08-05 DIAGNOSIS — G309 Alzheimer's disease, unspecified: Secondary | ICD-10-CM | POA: Diagnosis not present

## 2023-08-05 DIAGNOSIS — F02818 Dementia in other diseases classified elsewhere, unspecified severity, with other behavioral disturbance: Secondary | ICD-10-CM | POA: Diagnosis present

## 2023-08-05 DIAGNOSIS — M8088XA Other osteoporosis with current pathological fracture, vertebra(e), initial encounter for fracture: Secondary | ICD-10-CM | POA: Diagnosis not present

## 2023-08-05 DIAGNOSIS — M48061 Spinal stenosis, lumbar region without neurogenic claudication: Secondary | ICD-10-CM | POA: Diagnosis not present

## 2023-08-05 DIAGNOSIS — B338 Other specified viral diseases: Secondary | ICD-10-CM | POA: Diagnosis not present

## 2023-08-05 DIAGNOSIS — D1809 Hemangioma of other sites: Secondary | ICD-10-CM | POA: Diagnosis not present

## 2023-08-05 DIAGNOSIS — M5126 Other intervertebral disc displacement, lumbar region: Secondary | ICD-10-CM | POA: Diagnosis not present

## 2023-08-05 DIAGNOSIS — S32010D Wedge compression fracture of first lumbar vertebra, subsequent encounter for fracture with routine healing: Secondary | ICD-10-CM | POA: Diagnosis not present

## 2023-08-05 DIAGNOSIS — Z7401 Bed confinement status: Secondary | ICD-10-CM | POA: Diagnosis not present

## 2023-08-05 DIAGNOSIS — Z7984 Long term (current) use of oral hypoglycemic drugs: Secondary | ICD-10-CM | POA: Diagnosis not present

## 2023-08-05 DIAGNOSIS — F03918 Unspecified dementia, unspecified severity, with other behavioral disturbance: Secondary | ICD-10-CM | POA: Diagnosis not present

## 2023-08-05 DIAGNOSIS — W19XXXA Unspecified fall, initial encounter: Secondary | ICD-10-CM | POA: Diagnosis not present

## 2023-08-05 DIAGNOSIS — R0989 Other specified symptoms and signs involving the circulatory and respiratory systems: Secondary | ICD-10-CM | POA: Diagnosis not present

## 2023-08-05 DIAGNOSIS — Z743 Need for continuous supervision: Secondary | ICD-10-CM | POA: Diagnosis not present

## 2023-08-05 DIAGNOSIS — I10 Essential (primary) hypertension: Secondary | ICD-10-CM | POA: Diagnosis present

## 2023-08-05 DIAGNOSIS — Z515 Encounter for palliative care: Secondary | ICD-10-CM

## 2023-08-05 DIAGNOSIS — I499 Cardiac arrhythmia, unspecified: Secondary | ICD-10-CM | POA: Diagnosis not present

## 2023-08-05 DIAGNOSIS — Z79890 Hormone replacement therapy (postmenopausal): Secondary | ICD-10-CM

## 2023-08-05 DIAGNOSIS — M4856XA Collapsed vertebra, not elsewhere classified, lumbar region, initial encounter for fracture: Secondary | ICD-10-CM | POA: Diagnosis not present

## 2023-08-05 DIAGNOSIS — Z807 Family history of other malignant neoplasms of lymphoid, hematopoietic and related tissues: Secondary | ICD-10-CM

## 2023-08-05 DIAGNOSIS — M109 Gout, unspecified: Secondary | ICD-10-CM | POA: Diagnosis present

## 2023-08-05 DIAGNOSIS — Z888 Allergy status to other drugs, medicaments and biological substances status: Secondary | ICD-10-CM

## 2023-08-05 DIAGNOSIS — R2681 Unsteadiness on feet: Secondary | ICD-10-CM | POA: Diagnosis not present

## 2023-08-05 DIAGNOSIS — D631 Anemia in chronic kidney disease: Secondary | ICD-10-CM | POA: Diagnosis present

## 2023-08-05 DIAGNOSIS — M6281 Muscle weakness (generalized): Secondary | ICD-10-CM | POA: Diagnosis not present

## 2023-08-05 LAB — CBC
HCT: 43.4 % (ref 36.0–46.0)
Hemoglobin: 14.4 g/dL (ref 12.0–15.0)
MCH: 28.9 pg (ref 26.0–34.0)
MCHC: 33.2 g/dL (ref 30.0–36.0)
MCV: 87 fL (ref 80.0–100.0)
Platelets: 133 10*3/uL — ABNORMAL LOW (ref 150–400)
RBC: 4.99 MIL/uL (ref 3.87–5.11)
RDW: 13.3 % (ref 11.5–15.5)
WBC: 13.4 10*3/uL — ABNORMAL HIGH (ref 4.0–10.5)
nRBC: 0 % (ref 0.0–0.2)

## 2023-08-05 LAB — RESP PANEL BY RT-PCR (RSV, FLU A&B, COVID)  RVPGX2
Influenza A by PCR: NEGATIVE
Influenza B by PCR: NEGATIVE
Resp Syncytial Virus by PCR: NEGATIVE
SARS Coronavirus 2 by RT PCR: NEGATIVE

## 2023-08-05 LAB — URINALYSIS, ROUTINE W REFLEX MICROSCOPIC
Bacteria, UA: NONE SEEN
Bilirubin Urine: NEGATIVE
Glucose, UA: >=500 mg/dL — AB
Ketones, ur: 20 mg/dL — AB
Leukocytes,Ua: NEGATIVE
Nitrite: NEGATIVE
Protein, ur: 100 mg/dL — AB
Specific Gravity, Urine: 1.029 (ref 1.005–1.030)
pH: 5 (ref 5.0–8.0)

## 2023-08-05 LAB — COMPREHENSIVE METABOLIC PANEL
ALT: 44 U/L (ref 0–44)
AST: 37 U/L (ref 15–41)
Albumin: 4 g/dL (ref 3.5–5.0)
Alkaline Phosphatase: 81 U/L (ref 38–126)
Anion gap: 15 (ref 5–15)
BUN: 23 mg/dL (ref 8–23)
CO2: 22 mmol/L (ref 22–32)
Calcium: 10.3 mg/dL (ref 8.9–10.3)
Chloride: 101 mmol/L (ref 98–111)
Creatinine, Ser: 1.08 mg/dL — ABNORMAL HIGH (ref 0.44–1.00)
GFR, Estimated: 54 mL/min — ABNORMAL LOW (ref >60)
Glucose, Bld: 217 mg/dL — ABNORMAL HIGH (ref 70–99)
Potassium: 4.1 mmol/L (ref 3.5–5.1)
Sodium: 138 mmol/L (ref 135–145)
Total Bilirubin: 1 mg/dL (ref 0.3–1.2)
Total Protein: 7.6 g/dL (ref 6.5–8.1)

## 2023-08-05 LAB — CBG MONITORING, ED
Glucose-Capillary: 124 mg/dL — ABNORMAL HIGH (ref 70–99)
Glucose-Capillary: 95 mg/dL (ref 70–99)

## 2023-08-05 LAB — LACTIC ACID, PLASMA: Lactic Acid, Venous: 1.4 mmol/L (ref 0.5–1.9)

## 2023-08-05 MED ORDER — ROSUVASTATIN CALCIUM 20 MG PO TABS
20.0000 mg | ORAL_TABLET | Freq: Every day | ORAL | Status: DC
  Administered 2023-08-06 – 2023-08-16 (×11): 20 mg via ORAL
  Filled 2023-08-05 (×11): qty 1

## 2023-08-05 MED ORDER — VITAMIN B-12 1000 MCG PO TABS
1000.0000 ug | ORAL_TABLET | Freq: Every day | ORAL | Status: DC
  Administered 2023-08-06 – 2023-08-16 (×11): 1000 ug via ORAL
  Filled 2023-08-05: qty 1
  Filled 2023-08-05: qty 2
  Filled 2023-08-05 (×9): qty 1

## 2023-08-05 MED ORDER — ALBUTEROL SULFATE (2.5 MG/3ML) 0.083% IN NEBU: 2.5000 mg | INHALATION_SOLUTION | RESPIRATORY_TRACT | Status: DC | PRN

## 2023-08-05 MED ORDER — ENOXAPARIN SODIUM 40 MG/0.4ML IJ SOSY
40.0000 mg | PREFILLED_SYRINGE | INTRAMUSCULAR | Status: DC
  Administered 2023-08-05 – 2023-08-06 (×2): 40 mg via SUBCUTANEOUS
  Filled 2023-08-05: qty 0.4

## 2023-08-05 MED ORDER — LOSARTAN POTASSIUM 25 MG PO TABS
25.0000 mg | ORAL_TABLET | Freq: Every day | ORAL | Status: DC
  Administered 2023-08-05 – 2023-08-13 (×9): 25 mg via ORAL
  Filled 2023-08-05 (×9): qty 1

## 2023-08-05 MED ORDER — ONDANSETRON HCL 4 MG/2ML IJ SOLN: 4.0000 mg | Freq: Four times a day (QID) | INTRAMUSCULAR | Status: DC | PRN

## 2023-08-05 MED ORDER — CARVEDILOL 6.25 MG PO TABS
6.2500 mg | ORAL_TABLET | Freq: Two times a day (BID) | ORAL | Status: DC
  Administered 2023-08-05 – 2023-08-16 (×22): 6.25 mg via ORAL
  Filled 2023-08-05 (×22): qty 1

## 2023-08-05 MED ORDER — QUETIAPINE FUMARATE 25 MG PO TABS
100.0000 mg | ORAL_TABLET | Freq: Every day | ORAL | Status: DC
  Administered 2023-08-05: 100 mg via ORAL
  Filled 2023-08-05: qty 4

## 2023-08-05 MED ORDER — ICOSAPENT ETHYL 1 G PO CAPS
2.0000 g | ORAL_CAPSULE | Freq: Two times a day (BID) | ORAL | Status: DC
  Administered 2023-08-05 – 2023-08-07 (×4): 2 g via ORAL
  Administered 2023-08-07: 1 g via ORAL
  Administered 2023-08-08 – 2023-08-16 (×13): 2 g via ORAL
  Filled 2023-08-05 (×24): qty 2

## 2023-08-05 MED ORDER — INSULIN ASPART 100 UNIT/ML IJ SOLN
0.0000 [IU] | Freq: Every day | INTRAMUSCULAR | Status: DC
  Administered 2023-08-10 – 2023-08-15 (×5): 2 [IU] via SUBCUTANEOUS
  Filled 2023-08-05 (×5): qty 1

## 2023-08-05 MED ORDER — INSULIN ASPART 100 UNIT/ML IJ SOLN
0.0000 [IU] | Freq: Three times a day (TID) | INTRAMUSCULAR | Status: DC
  Administered 2023-08-05 – 2023-08-06 (×2): 2 [IU] via SUBCUTANEOUS
  Administered 2023-08-07 – 2023-08-08 (×3): 3 [IU] via SUBCUTANEOUS
  Administered 2023-08-09 (×2): 2 [IU] via SUBCUTANEOUS
  Administered 2023-08-10: 3 [IU] via SUBCUTANEOUS
  Administered 2023-08-10: 5 [IU] via SUBCUTANEOUS
  Administered 2023-08-10: 2 [IU] via SUBCUTANEOUS
  Administered 2023-08-11: 8 [IU] via SUBCUTANEOUS
  Administered 2023-08-11 – 2023-08-12 (×5): 3 [IU] via SUBCUTANEOUS
  Administered 2023-08-13: 11 [IU] via SUBCUTANEOUS
  Administered 2023-08-13: 3 [IU] via SUBCUTANEOUS
  Administered 2023-08-13: 5 [IU] via SUBCUTANEOUS
  Administered 2023-08-14: 8 [IU] via SUBCUTANEOUS
  Administered 2023-08-14 (×2): 5 [IU] via SUBCUTANEOUS
  Administered 2023-08-15: 15 [IU] via SUBCUTANEOUS
  Administered 2023-08-15: 3 [IU] via SUBCUTANEOUS
  Administered 2023-08-15: 5 [IU] via SUBCUTANEOUS
  Administered 2023-08-16: 3 [IU] via SUBCUTANEOUS
  Administered 2023-08-16: 15 [IU] via SUBCUTANEOUS
  Filled 2023-08-05 (×27): qty 1

## 2023-08-05 MED ORDER — ALLOPURINOL 100 MG PO TABS
100.0000 mg | ORAL_TABLET | Freq: Two times a day (BID) | ORAL | Status: DC
  Administered 2023-08-05 – 2023-08-16 (×22): 100 mg via ORAL
  Filled 2023-08-05 (×23): qty 1

## 2023-08-05 MED ORDER — DONEPEZIL HCL 5 MG PO TABS
10.0000 mg | ORAL_TABLET | Freq: Every day | ORAL | Status: DC
  Administered 2023-08-06 – 2023-08-16 (×11): 10 mg via ORAL
  Filled 2023-08-05 (×11): qty 2

## 2023-08-05 MED ORDER — SERTRALINE HCL 50 MG PO TABS
50.0000 mg | ORAL_TABLET | Freq: Every day | ORAL | Status: DC
  Administered 2023-08-06 – 2023-08-16 (×11): 50 mg via ORAL
  Filled 2023-08-05 (×11): qty 1

## 2023-08-05 MED ORDER — SODIUM CHLORIDE 0.9 % IV BOLUS
500.0000 mL | Freq: Once | INTRAVENOUS | Status: AC
Start: 2023-08-05 — End: 2023-08-05
  Administered 2023-08-05: 500 mL via INTRAVENOUS

## 2023-08-05 MED ORDER — ACETAMINOPHEN 325 MG PO TABS: 650.0000 mg | ORAL_TABLET | Freq: Four times a day (QID) | ORAL | Status: DC | PRN

## 2023-08-05 MED ORDER — SODIUM CHLORIDE 0.9 % IV BOLUS
500.0000 mL | Freq: Once | INTRAVENOUS | Status: AC
  Administered 2023-08-05: 500 mL via INTRAVENOUS

## 2023-08-05 MED ORDER — ONDANSETRON HCL 4 MG PO TABS: 4.0000 mg | ORAL_TABLET | Freq: Four times a day (QID) | ORAL | Status: DC | PRN

## 2023-08-05 MED ORDER — SODIUM CHLORIDE 0.9 % IV SOLN: INTRAVENOUS | Status: AC

## 2023-08-05 MED ORDER — MEMANTINE HCL 5 MG PO TABS
10.0000 mg | ORAL_TABLET | Freq: Two times a day (BID) | ORAL | Status: DC
  Administered 2023-08-05 – 2023-08-16 (×22): 10 mg via ORAL
  Filled 2023-08-05 (×22): qty 2

## 2023-08-05 MED ORDER — ACETAMINOPHEN 650 MG RE SUPP: 650.0000 mg | Freq: Four times a day (QID) | RECTAL | Status: DC | PRN

## 2023-08-05 NOTE — H&P (Signed)
History and Physical    Lindsey [name] WUJ:[id number] DOB: 1950/12/15 DOA: 08/05/2023  PCP: Alba Cory, MD Patient coming from:   I have personally briefly reviewed patient's old medical records in [facility]  Chief Complaint: Fall  HPI: Lindsey [name] is a 72 y.o. female with medical history significant of dementia with behavioral disturbance, type 2 diabetes, hypertension, hyperlipidemia, depression, osteopenia who lives alone who was brought in for weakness and falls.  Patient unable to provide accurate history.  History obtained by speaking with the ER physician and reviewing the medical record.  Patient lives independently and does all of her activities of daily living with family support close by.  Family noted her to be increasingly weak and confused and patient had a witnessed fall.  Unclear if this was syncopal event or not.  Patient was brought to the ED for further evaluation.  ED Course: Patient was given some intravenous fluids for presumed intravascular volume depletion.  CT head reviewed with no acute findings.  Hospitalist contacted for admission.  Review of Systems: As per HPI otherwise 10 point review of systems negative.    Past Medical History:  Diagnosis Date   Acute gout    Allergy ace inhibitors   Anemia of chronic disease    Anxiety    Basal cell carcinoma    Cataract    Depression, major, recurrent, mild (HCC)    Diabetes mellitus without complication (HCC)    Fibromyalgia syndrome    Hyperlipidemia    Hypertension    Osteoporosis    Retinopathy    Stage 3a chronic kidney disease (HCC) 05/08/2022    Past Surgical History:  Procedure Laterality Date   CESAREAN SECTION     TONSILECTOMY, ADENOIDECTOMY, BILATERAL MYRINGOTOMY AND TUBES     TUBAL LIGATION       reports that she has never smoked. She has never used smokeless tobacco. She reports that she does not drink alcohol and does not use drugs.  Allergies  Allergen Reactions   Ace  Inhibitors     Family History  Problem Relation Age of Onset   Hypertension Mother    Osteoporosis Mother    Cancer Mother        brain tumor   Hodgkin's lymphoma Father    Hypertension Sister    Cancer Sister        unknown   Hypertension Sister    Diabetes Sister    Cancer Sister 28       breast   Breast cancer Sister 38   Cancer Maternal Uncle     Prior to Admission medications   Medication Sig Start Date End Date Taking? Authorizing Provider  allopurinol (ZYLOPRIM) 100 MG tablet Take 1 tablet by mouth twice daily 07/30/23   Alba Cory, MD  carvedilol (COREG) 6.25 MG tablet Take 1 tablet (6.25 mg total) by mouth 2 (two) times daily with a meal. 05/30/23   Carlynn Purl, Danna Hefty, MD  cholecalciferol (VITAMIN D3) 25 MCG (1000 UNIT) tablet Take 1 tablet (1,000 Units total) by mouth daily. 11/17/22   Wouk, Wilfred Curtis, MD  cyanocobalamin (VITAMIN B12) 1000 MCG tablet Take 1,000 mcg by mouth daily.    [provider]  donepezil (ARICEPT) 10 MG tablet Take 1 tablet (10 mg total) by mouth at bedtime. Patient taking differently: Take 10 mg by mouth daily. In the morning 07/04/18   Alba Cory, MD  GLUCOSE BLOOD VI  05/26/07   [provider]  JARDIANCE 25 MG TABS  tablet Take 1 tablet by mouth once daily 07/30/23   Alba Cory, MD  losartan (COZAAR) 25 MG tablet Take 1 tablet by mouth once daily 07/30/23   Alba Cory, MD  Melatonin 5 MG CAPS Take 5 mg by mouth at bedtime.  07/13/16   [provider]  memantine (NAMENDA) 10 MG tablet Take 10 mg by mouth 2 (two) times daily. 06/14/20   [provider]  metFORMIN (GLUCOPHAGE-XR) 500 MG 24 hr tablet Take 2 tablets (1,000 mg total) by mouth 2 (two) times daily with a meal. 07/09/23   Berniece Salines, FNP  MULTIPLE VITAMIN PO Take 1 tablet by mouth daily. 12/28/10   [provider]  QUEtiapine (SEROQUEL) 100 MG tablet Take 1 tablet by mouth in the morning, at noon, and at bedtime. Taking 100mg   am, afternoon, and pm 07/15/19   Morene Crocker, MD  rosuvastatin (CRESTOR) 20 MG tablet Take 1 tablet (20 mg total) by mouth daily. 09/05/22   Alba Cory, MD  sertraline (ZOLOFT) 50 MG tablet Take 50 mg by mouth every evening.    [provider]  sitaGLIPtin (JANUVIA) 50 MG tablet Take 1 tablet (50 mg total) by mouth daily. 07/09/23   Berniece Salines, FNP  VASCEPA 1 g capsule Take 2 capsules (2 g total) by mouth 2 (two) times daily. 05/13/23   Alba Cory, MD    Physical Exam: Vitals:   08/05/23 0942 08/05/23 0943 08/05/23 1315 08/05/23 1634  BP: 123/80  (!) 147/92 (!) 141/84  Pulse: (!) 135  (!) 113 (!) 124  Resp: 16  20 20   Temp: 98 F (36.7 C)  98.1 F (36.7 C) 99.3 F (37.4 C)  TempSrc: Oral  Oral Oral  SpO2: 97%  94% 94%  Weight:  65.8 kg    Height:  4\' 11"  (1.499 m)      Vitals:   08/05/23 0942 08/05/23 0943 08/05/23 1315 08/05/23 1634  BP: 123/80  (!) 147/92 (!) 141/84  Pulse: (!) 135  (!) 113 (!) 124  Resp: 16  20 20   Temp: 98 F (36.7 C)  98.1 F (36.7 C) 99.3 F (37.4 C)  TempSrc: Oral  Oral Oral  SpO2: 97%  94% 94%  Weight:  65.8 kg    Height:  4\' 11"  (1.499 m)     General: No apparent distress, patient appears well HEENT: Normocephalic, atraumatic Neck, supple, trachea midline, no tenderness Heart: Regular rate and rhythm, S1/S2 normal, no murmurs Lungs: Lungs clear.  Normal work of breathing.  Room air Abdomen: Soft, nontender, nondistended, positive bowel sounds Extremities: Normal, atraumatic, no clubbing or cyanosis, normal muscle tone Skin: No rashes or lesions, normal color Neurologic: Cranial nerves grossly intact, sensation intact, alert and oriented x 2 Psychiatric: Confused affect   Labs on Admission: I have personally reviewed following labs and imaging studies  CBC: Recent Labs  Lab 08/05/23 0950  WBC 13.4*  HGB 14.4  HCT 43.4  MCV 87.0  PLT 133*   Basic Metabolic Panel: Recent Labs  Lab 08/05/23 0950  NA 138   K 4.1  CL 101  CO2 22  GLUCOSE 217*  BUN 23  CREATININE 1.08*  CALCIUM 10.3   GFR: Estimated Creatinine Clearance: 38.2 mL/min (A) (by C-G formula based on SCr of 1.08 mg/dL (H)). Liver Function Tests: Recent Labs  Lab 08/05/23 0950  AST 37  ALT 44  ALKPHOS 81  BILITOT 1.0  PROT 7.6  ALBUMIN 4.0   No results for  input(s): "LIPASE", "AMYLASE" in the last 168 hours. No results for input(s): "AMMONIA" in the last 168 hours. Coagulation Profile: No results for input(s): "INR", "PROTIME" in the last 168 hours. Cardiac Enzymes: No results for input(s): "CKTOTAL", "CKMB", "CKMBINDEX", "TROPONINI" in the last 168 hours. BNP (last 3 results) No results for input(s): "PROBNP" in the last 8760 hours. HbA1C: No results for input(s): "HGBA1C" in the last 72 hours. CBG: No results for input(s): "GLUCAP" in the last 168 hours. Lipid Profile: No results for input(s): "CHOL", "HDL", "LDLCALC", "TRIG", "CHOLHDL", "LDLDIRECT" in the last 72 hours. Thyroid Function Tests: No results for input(s): "TSH", "T4TOTAL", "FREET4", "T3FREE", "THYROIDAB" in the last 72 hours. Anemia Panel: No results for input(s): "VITAMINB12", "FOLATE", "FERRITIN", "TIBC", "IRON", "RETICCTPCT" in the last 72 hours. Urine analysis:    Component Value Date/Time   COLORURINE YELLOW (A) 08/05/2023 1533   APPEARANCEUR CLOUDY (A) 08/05/2023 1533   LABSPEC 1.029 08/05/2023 1533   PHURINE 5.0 08/05/2023 1533   GLUCOSEU >=500 (A) 08/05/2023 1533   HGBUR LARGE (A) 08/05/2023 1533   BILIRUBINUR NEGATIVE 08/05/2023 1533   BILIRUBINUR normal 11/13/2016 1514   KETONESUR 20 (A) 08/05/2023 1533   PROTEINUR 100 (A) 08/05/2023 1533   UROBILINOGEN 1.0 11/13/2016 1514   NITRITE NEGATIVE 08/05/2023 1533   LEUKOCYTESUR NEGATIVE 08/05/2023 1533    Radiological Exams on Admission: DG Chest Portable 1 View  Result Date: 08/05/2023 CLINICAL DATA:  Weakness.  Recent fall. EXAM: PORTABLE CHEST 1 VIEW COMPARISON:  11/15/2022.  FINDINGS: Low lung volume. Bilateral lung fields are clear. Bilateral costophrenic angles are clear. Stable cardio-mediastinal silhouette. No acute osseous abnormalities. The soft tissues are within normal limits. IMPRESSION: No active disease. Electronically Signed   By: Jules Schick M.D.   On: 08/05/2023 14:22   CT HEAD WO CONTRAST ( )  Result Date: 08/05/2023 CLINICAL DATA:  ams EXAM: CT HEAD WITHOUT CONTRAST TECHNIQUE: Contiguous axial images were obtained from the base of the skull through the vertex without intravenous contrast. RADIATION DOSE REDUCTION: This exam was performed according to the departmental dose-optimization program which includes automated exposure control, adjustment of the mA and/or kV according to patient size and/or use of iterative reconstruction technique. COMPARISON:  CT Head 11/15/22 FINDINGS: Brain: No evidence of acute infarction, hemorrhage, hydrocephalus, extra-axial collection or mass lesion/mass effect. There is sequela of moderate chronic microvascular ischemic change. Vascular: No hyperdense vessel or unexpected calcification. Skull: Normal. Negative for fracture or focal lesion. Sinuses/Orbits: No middle ear or mastoid effusion. Paranasal sinuses clear. Orbits are unremarkable. Other: None. IMPRESSION: No acute intracranial abnormality. Electronically Signed   By: Lorenza Cambridge M.D.   On: 08/05/2023 12:20    EKG: Independently reviewed. Sinus tachycardia  Assessment/Plan Principal Problem:   Fall  Fall Weakness Acute encephalopathy Patient had a fall to floor witnessed by family.  Not at baseline mental status.  She does have a history of dementia and hypertension.  Urinalysis not indicative of infection.  Unclear nature of encephalopathy.  No loss of consciousness or head trauma.  Patient herself denies any pain. Plan: Place in observation Fall precautions PT and OT evaluations IV fluids MRI brain  AKI Mild, baseline appears to be  normal Plan: IVF x 24 hours  Sinus tachycardia Asymptomatic, no signs of atrial fibrillation or arrhythmia Suspect related to volume depletion Plan: IVF x 24 hours as above Cardiac monitoring x 24 hours  Dementia with behavioral disturbance, Alzheimer's type Continue sertraline 50 mg daily Continue donepezil 10 mg daily Continue memantine 10 mg  daily Patient is documented taking sertraline 100 mg 3 times daily.  Given acute encephalopathy and alteration from baseline will reduce dose to 100 mg nightly  Hyperlipidemia PTA statin  Essential hypertension PTA Coreg PT Cozaar  History of gout PTA allopurinol  B12 deficiency B12 tablets  DVT prophylaxis: SQ lovenox Code Status: FULL Family Communication: Left VM for daughter Marcelino Duster [phone] on 10/21 Disposition Plan: Anticipate return to previous home environment Consults called: None Admission status: Observation, medical telemetry   Tresa Moore MD [facility]   If 7PM-7AM, please contact night-coverage   08/05/2023, 4:43 PM

## 2023-08-05 NOTE — ED Triage Notes (Signed)
Pt comes via  EMs from home with c/o fall. Pt had fall to floor witnessed by family. Pt is not at baseline per family. Hx of dementia and HTN. 129/82 HR-135 O2-98% RA CBG-228  No loc or hitting head. Pt denies any pain.   22 in left hand with some fluids.

## 2023-08-05 NOTE — ED Provider Notes (Signed)
[facility] Provider Note    Event Date/Time   First MD Initiated Contact with Patient 08/05/23 1102     (approximate)   History   Weakness   HPI  Lindsey [name] is a 72 y.o. female   who presents to the emergency department with primary concern for weakness.  Patient does have history of dementia and cannot give any significant history.  History was obtained from family at bedside.  They state that they have noticed over the past couple of days increased weakness for the patient.  They state that they initially thought this might be due to a lack of sleep that the patient got 1 night however it has continued.  They have appreciated strong odor to her urine.  The patient has not had significant UTIs in the past.      Physical Exam   Triage Vital Signs: ED Triage Vitals  Encounter Vitals Group     BP 08/05/23 0942 123/80     Systolic BP Percentile --      Diastolic BP Percentile --      Pulse Rate 08/05/23 0942 (!) 135     Resp 08/05/23 0942 16     Temp 08/05/23 0942 98 F (36.7 C)     Temp Source 08/05/23 0942 Oral     SpO2 08/05/23 0942 97 %     Weight 08/05/23 0943 145 lb (65.8 kg)     Height 08/05/23 0943 4\' 11"  (1.499 m)     Head Circumference --      Peak Flow --      Pain Score 08/05/23 0940 0     Pain Loc --      Pain Education --      Exclude from Growth Chart --     Most recent vital signs: Vitals:   08/05/23 0942  BP: 123/80  Pulse: (!) 135  Resp: 16  Temp: 98 F (36.7 C)  SpO2: 97%   General: Awake, alert, not oriented. CV:  Good peripheral perfusion. Regular rate and rhythm. Resp:  Normal effort. Lungs clear. Abd:  No distention. Non tender.  ED Results / Procedures / Treatments   Labs (all labs ordered are listed, but only abnormal results are displayed) Labs Reviewed  COMPREHENSIVE METABOLIC PANEL - Abnormal; Notable for the following components:      Result Value   Glucose, Bld 217 (*)    Creatinine, Ser 1.08  (*)    GFR, Estimated 54 (*)    All other components within normal limits  CBC - Abnormal; Notable for the following components:   WBC 13.4 (*)    Platelets 133 (*)    All other components within normal limits  CBG MONITORING, ED     EKG  I, Phineas Semen, attending physician, personally viewed and interpreted this EKG  EKG Time: 0948 Rate: 131 Rhythm: sinus tachycardia Axis: left axis deviation Intervals: qtc 460 QRS: narrow ST changes: no st elevation Impression: abnormal ekg  RADIOLOGY I independently interpreted and visualized the CT head. My interpretation: No bleed Radiology interpretation: ***    PROCEDURES:  Critical Care performed: Yes  CRITICAL CARE Performed by: Phineas Semen   Total critical care time: *** minutes  Critical care time was exclusive of separately billable procedures and treating other patients.  Critical care was necessary to treat or prevent imminent or life-threatening deterioration.  Critical care was time spent personally by me on the following activities: development of treatment plan with  patient and/or surrogate as well as nursing, discussions with consultants, evaluation of patient's response to treatment, examination of patient, obtaining history from patient or surrogate, ordering and performing treatments and interventions, ordering and review of laboratory studies, ordering and review of radiographic studies, pulse oximetry and re-evaluation of patient's condition.   Procedures    MEDICATIONS ORDERED IN ED: Medications - No data to display   IMPRESSION / MDM / ASSESSMENT AND PLAN / ED COURSE  I reviewed the triage vital signs and the nursing notes.                              Differential diagnosis includes, but is not limited to, UTI, pneumonia, anemia, dehydration  Patient's presentation is most consistent with acute presentation with potential threat to life or bodily function.   Patient presented to the  emergency department today because of concerns for increasing weakness.  Would have concern for possible infection or dehydration given tachycardia.  Patient was afebrile.  Will give IV fluids check blood work, imaging.  Patients heart rate did improve after fluids.  Awaiting urine at time of signout.   FINAL CLINICAL IMPRESSION(S) / ED DIAGNOSES   Final diagnoses:  None     Rx / DC Orders   ED Discharge Orders     None        Note:  This document was prepared using Dragon voice recognition software and may include unintentional dictation errors.

## 2023-08-05 NOTE — Telephone Encounter (Signed)
Requested medication (s) are due for refill today: na  Requested medication (s) are on the active medication list: yes   Last refill:  07/09/23 #30 0 refills  Future visit scheduled: yes in 1 month  Notes to clinic:  no refills remain. Do you want to refill Rx?     Requested Prescriptions  Pending Prescriptions Disp Refills   JANUVIA 50 MG tablet [Pharmacy Med Name: Januvia 50 MG Oral Tablet] 30 tablet 0    Sig: Take 1 tablet by mouth once daily     Endocrinology:  Diabetes - DPP-4 Inhibitors Failed - 08/04/2023 10:02 AM      Failed - Cr in normal range and within 360 days    Creat  Date Value Ref Range Status  01/04/2023 1.19 (H) 0.60 - 1.00 mg/dL Final   Creatinine, Ser  Date Value Ref Range Status  08/05/2023 1.08 (H) 0.44 - 1.00 mg/dL Final   Creatinine, Urine  Date Value Ref Range Status  01/04/2023 30 20 - 275 mg/dL Final         Passed - HBA1C is between 0 and 7.9 and within 180 days    Hemoglobin A1C  Date Value Ref Range Status  05/06/2023 7.2 (A) 4.0 - 5.6 % Final   HbA1c, POC (controlled diabetic range)  Date Value Ref Range Status  04/22/2019 7.4 (A) 0.0 - 7.0 % Final   Hgb A1c MFr Bld  Date Value Ref Range Status  10/30/2019 6.9 (H) <5.7 % of total Hgb Final    Comment:    For someone without known diabetes, a hemoglobin A1c value of 6.5% or greater indicates that they may have  diabetes and this should be confirmed with a follow-up  test. . For someone with known diabetes, a value <7% indicates  that their diabetes is well controlled and a value  greater than or equal to 7% indicates suboptimal  control. A1c targets should be individualized based on  duration of diabetes, age, comorbid conditions, and  other considerations. . Currently, no consensus exists regarding use of hemoglobin A1c for diagnosis of diabetes for children. Verna Czech - Valid encounter within last 6 months    Recent Outpatient Visits           2 months ago  Benign essential HTN   [place] [facility] [place], Danna Hefty, MD   3 months ago Type 2 diabetes mellitus with microalbuminuria, without long-term current use of insulin [facility])   [facility] Alba Cory, MD   7 months ago Type 2 diabetes mellitus with microalbuminuria, without long-term current use of insulin [facility])   [place] [facility] Alba Cory, MD   8 months ago Hospital discharge follow-up   [facility] Alba Cory, MD   11 months ago Type 2 diabetes mellitus with microalbuminuria, without long-term current use of insulin [facility])   [place] [facility] Alba Cory, MD       Future Appointments             In 1 month Carlynn Purl, Danna Hefty, MD [facility], [facility]

## 2023-08-06 ENCOUNTER — Other Ambulatory Visit: Payer: Self-pay

## 2023-08-06 ENCOUNTER — Encounter: Payer: Self-pay | Admitting: Internal Medicine

## 2023-08-06 DIAGNOSIS — Z8249 Family history of ischemic heart disease and other diseases of the circulatory system: Secondary | ICD-10-CM | POA: Diagnosis not present

## 2023-08-06 DIAGNOSIS — E869 Volume depletion, unspecified: Secondary | ICD-10-CM | POA: Diagnosis present

## 2023-08-06 DIAGNOSIS — N179 Acute kidney failure, unspecified: Secondary | ICD-10-CM | POA: Diagnosis present

## 2023-08-06 DIAGNOSIS — Z7984 Long term (current) use of oral hypoglycemic drugs: Secondary | ICD-10-CM | POA: Diagnosis not present

## 2023-08-06 DIAGNOSIS — E1165 Type 2 diabetes mellitus with hyperglycemia: Secondary | ICD-10-CM | POA: Diagnosis present

## 2023-08-06 DIAGNOSIS — E11319 Type 2 diabetes mellitus with unspecified diabetic retinopathy without macular edema: Secondary | ICD-10-CM | POA: Diagnosis present

## 2023-08-06 DIAGNOSIS — G8929 Other chronic pain: Secondary | ICD-10-CM | POA: Diagnosis present

## 2023-08-06 DIAGNOSIS — W19XXXD Unspecified fall, subsequent encounter: Secondary | ICD-10-CM | POA: Diagnosis not present

## 2023-08-06 DIAGNOSIS — G929 Unspecified toxic encephalopathy: Secondary | ICD-10-CM | POA: Diagnosis present

## 2023-08-06 DIAGNOSIS — M797 Fibromyalgia: Secondary | ICD-10-CM | POA: Diagnosis present

## 2023-08-06 DIAGNOSIS — M4856XA Collapsed vertebra, not elsewhere classified, lumbar region, initial encounter for fracture: Secondary | ICD-10-CM | POA: Diagnosis not present

## 2023-08-06 DIAGNOSIS — E785 Hyperlipidemia, unspecified: Secondary | ICD-10-CM | POA: Diagnosis present

## 2023-08-06 DIAGNOSIS — E1129 Type 2 diabetes mellitus with other diabetic kidney complication: Secondary | ICD-10-CM | POA: Diagnosis not present

## 2023-08-06 DIAGNOSIS — M5126 Other intervertebral disc displacement, lumbar region: Secondary | ICD-10-CM | POA: Diagnosis not present

## 2023-08-06 DIAGNOSIS — I129 Hypertensive chronic kidney disease with stage 1 through stage 4 chronic kidney disease, or unspecified chronic kidney disease: Secondary | ICD-10-CM | POA: Diagnosis present

## 2023-08-06 DIAGNOSIS — D1809 Hemangioma of other sites: Secondary | ICD-10-CM | POA: Diagnosis not present

## 2023-08-06 DIAGNOSIS — Z515 Encounter for palliative care: Secondary | ICD-10-CM | POA: Diagnosis not present

## 2023-08-06 DIAGNOSIS — R262 Difficulty in walking, not elsewhere classified: Secondary | ICD-10-CM | POA: Diagnosis present

## 2023-08-06 DIAGNOSIS — Z1152 Encounter for screening for COVID-19: Secondary | ICD-10-CM | POA: Diagnosis not present

## 2023-08-06 DIAGNOSIS — F03918 Unspecified dementia, unspecified severity, with other behavioral disturbance: Secondary | ICD-10-CM | POA: Diagnosis not present

## 2023-08-06 DIAGNOSIS — G309 Alzheimer's disease, unspecified: Secondary | ICD-10-CM | POA: Diagnosis present

## 2023-08-06 DIAGNOSIS — E669 Obesity, unspecified: Secondary | ICD-10-CM | POA: Diagnosis present

## 2023-08-06 DIAGNOSIS — W07XXXA Fall from chair, initial encounter: Secondary | ICD-10-CM | POA: Diagnosis present

## 2023-08-06 DIAGNOSIS — Z79899 Other long term (current) drug therapy: Secondary | ICD-10-CM | POA: Diagnosis not present

## 2023-08-06 DIAGNOSIS — N390 Urinary tract infection, site not specified: Secondary | ICD-10-CM | POA: Diagnosis not present

## 2023-08-06 DIAGNOSIS — D631 Anemia in chronic kidney disease: Secondary | ICD-10-CM | POA: Diagnosis present

## 2023-08-06 DIAGNOSIS — F02818 Dementia in other diseases classified elsewhere, unspecified severity, with other behavioral disturbance: Secondary | ICD-10-CM | POA: Diagnosis present

## 2023-08-06 DIAGNOSIS — M8088XA Other osteoporosis with current pathological fracture, vertebra(e), initial encounter for fracture: Secondary | ICD-10-CM | POA: Diagnosis present

## 2023-08-06 DIAGNOSIS — R809 Proteinuria, unspecified: Secondary | ICD-10-CM | POA: Diagnosis not present

## 2023-08-06 DIAGNOSIS — M48061 Spinal stenosis, lumbar region without neurogenic claudication: Secondary | ICD-10-CM | POA: Diagnosis not present

## 2023-08-06 DIAGNOSIS — R531 Weakness: Secondary | ICD-10-CM | POA: Diagnosis not present

## 2023-08-06 DIAGNOSIS — R627 Adult failure to thrive: Secondary | ICD-10-CM | POA: Diagnosis present

## 2023-08-06 DIAGNOSIS — F05 Delirium due to known physiological condition: Secondary | ICD-10-CM | POA: Diagnosis not present

## 2023-08-06 DIAGNOSIS — Z85828 Personal history of other malignant neoplasm of skin: Secondary | ICD-10-CM | POA: Diagnosis not present

## 2023-08-06 DIAGNOSIS — B338 Other specified viral diseases: Secondary | ICD-10-CM | POA: Diagnosis not present

## 2023-08-06 DIAGNOSIS — K5900 Constipation, unspecified: Secondary | ICD-10-CM | POA: Diagnosis not present

## 2023-08-06 LAB — GLUCOSE, CAPILLARY: Glucose-Capillary: 130 mg/dL — ABNORMAL HIGH (ref 70–99)

## 2023-08-06 LAB — BASIC METABOLIC PANEL
Anion gap: 14 (ref 5–15)
BUN: 26 mg/dL — ABNORMAL HIGH (ref 8–23)
CO2: 20 mmol/L — ABNORMAL LOW (ref 22–32)
Calcium: 9.4 mg/dL (ref 8.9–10.3)
Chloride: 107 mmol/L (ref 98–111)
Creatinine, Ser: 1.11 mg/dL — ABNORMAL HIGH (ref 0.44–1.00)
GFR, Estimated: 52 mL/min — ABNORMAL LOW (ref >60)
Glucose, Bld: 100 mg/dL — ABNORMAL HIGH (ref 70–99)
Potassium: 3.9 mmol/L (ref 3.5–5.1)
Sodium: 141 mmol/L (ref 135–145)

## 2023-08-06 LAB — CBC
HCT: 35.7 % — ABNORMAL LOW (ref 36.0–46.0)
Hemoglobin: 11 g/dL — ABNORMAL LOW (ref 12.0–15.0)
MCH: 28.6 pg (ref 26.0–34.0)
MCHC: 30.8 g/dL (ref 30.0–36.0)
MCV: 93 fL (ref 80.0–100.0)
Platelets: 158 10*3/uL (ref 150–400)
RBC: 3.84 MIL/uL — ABNORMAL LOW (ref 3.87–5.11)
RDW: 13.2 % (ref 11.5–15.5)
WBC: 8.6 10*3/uL (ref 4.0–10.5)
nRBC: 0 % (ref 0.0–0.2)

## 2023-08-06 LAB — CBG MONITORING, ED
Glucose-Capillary: 78 mg/dL (ref 70–99)
Glucose-Capillary: 134 mg/dL — ABNORMAL HIGH (ref 70–99)
Glucose-Capillary: 103 mg/dL — ABNORMAL HIGH (ref 70–99)

## 2023-08-06 MED ORDER — SITAGLIPTIN PHOSPHATE 50 MG PO TABS: 50.0000 mg | ORAL_TABLET | Freq: Every day | ORAL | 0 refills | Status: DC

## 2023-08-06 MED ORDER — QUETIAPINE FUMARATE 100 MG PO TABS
100.0000 mg | ORAL_TABLET | Freq: Three times a day (TID) | ORAL | Status: DC
  Administered 2023-08-06 – 2023-08-14 (×24): 100 mg via ORAL
  Filled 2023-08-06 (×2): qty 4
  Filled 2023-08-06 (×6): qty 1
  Filled 2023-08-06 (×4): qty 4
  Filled 2023-08-06 (×4): qty 1
  Filled 2023-08-06 (×2): qty 4
  Filled 2023-08-06 (×3): qty 1
  Filled 2023-08-06: qty 4
  Filled 2023-08-06 (×2): qty 1
  Filled 2023-08-06 (×2): qty 4
  Filled 2023-08-06 (×2): qty 1
  Filled 2023-08-06 (×2): qty 4
  Filled 2023-08-06 (×3): qty 1
  Filled 2023-08-06: qty 4
  Filled 2023-08-06 (×5): qty 1
  Filled 2023-08-06: qty 4
  Filled 2023-08-06: qty 1

## 2023-08-06 NOTE — ED Notes (Signed)
Pt assisted by this RN to bathroom with walker. Pt changed into clean gown and brief. Bed linen changed. Pt ambulated tolerably well but required small reminders on safety.

## 2023-08-06 NOTE — Progress Notes (Signed)
PROGRESS NOTE    Lindsey [name]  WUJ:[id number] DOB: [birth year] DOA: 08/05/2023 PCP: Alba Cory, MD    Brief Narrative:   72 y.o. female with medical history significant of dementia with behavioral disturbance, type 2 diabetes, hypertension, hyperlipidemia, depression, osteopenia who lives alone who was brought in for weakness and falls.   Patient unable to provide accurate history.  History obtained by speaking with the ER physician and reviewing the medical record.  Patient lives independently and does all of her activities of daily living with family support close by.  Family noted her to be increasingly weak and confused and patient had a witnessed fall.  Unclear if this was syncopal event or not.  Patient was brought to the ED for further evaluation.   Assessment & Plan:   Principal Problem:   Fall Fall Weakness Acute encephalopathy Patient had a fall to floor witnessed by family.  Not at baseline mental status.  She does have a history of dementia and hypertension.  Urinalysis not indicative of infection.  Unclear nature of encephalopathy.  No loss of consciousness or head trauma.  Patient herself denies any pain.  MRI negative for acute issues.  Some volume loss indicative of underlying cognitive decline. Plan: PT and OT evaluations and recommendations appreciated.  Recommendation for skilled nursing facility.  Patient would likely benefit if within the family's goals of care.  Can restart IV fluids in the next 24 hours   AKI Mild, baseline appears to be normal Plan: Continue IVF x 24 more hours   Sinus tachycardia Asymptomatic, no signs of atrial fibrillation or arrhythmia Suspect related to volume depletion Plan: IVF x 24 hours as above Cardiac monitoring x 24 hours   Dementia with behavioral disturbance, Alzheimer's type Continue sertraline 50 mg daily Continue donepezil 10 mg daily Continue memantine 10 mg daily Patient is documented taking sertraline 100 mg 3  times daily.  Given acute encephalopathy and alteration from baseline will reduce dose to 100 mg nightly   Hyperlipidemia PTA statin   Essential hypertension PTA Coreg PT Cozaar   History of gout PTA allopurinol   B12 deficiency B12 tablets    DVT prophylaxis: Lovenox Code Status: Full Family Communication: Left VM for daughter [phone] on 10/22 Disposition Plan: Status is: Observation The patient will require care spanning > 2 midnights and should be moved to inpatient because: AKI, encephalopathy, unsteadiness.  Will need skilled nursing facility.   Level of care: Telemetry Medical  Consultants:  None  Procedures:  None  Antimicrobials: None   Subjective: Seen and examined.  Unfortunately remains in ED hallway.  Stable no distress.  Objective: Vitals:   08/05/23 2130 08/06/23 0514 08/06/23 0737 08/06/23 1008  BP: (!) 141/92 (!) 105/54  (!) 119/55  Pulse: (!) 114 (!) 111  (!) 116  Resp: 20 18  20   Temp: 98.9 F (37.2 C)  98.6 F (37 C)   TempSrc: Oral  Oral   SpO2: 94% 93%  95%  Weight:      Height:       No intake or output data in the 24 hours ending 08/06/23 1334 Filed Weights   08/05/23 0943  Weight: 65.8 kg    Examination:  General exam: Appears calm and comfortable  Respiratory system: Clear to auscultation. Respiratory effort normal. Cardiovascular system: S1-S2 2, tachycardic, regular rhythm, no murmurs Gastrointestinal system: Soft, NT/ND, normal bowel sounds Central nervous system: Alert and oriented x 2.  No focal deficits Extremities: Symmetric 5 x 5 power.  Skin: No rashes, lesions or ulcers Psychiatry: Judgement and insight appear normal. Mood & affect appropriate.     Data Reviewed: I have personally reviewed following labs and imaging studies  CBC: Recent Labs  Lab 08/05/23 0950 08/06/23 0507  WBC 13.4* 8.6  HGB 14.4 11.0*  HCT 43.4 35.7*  MCV 87.0 93.0  PLT 133* 158   Basic Metabolic Panel: Recent Labs  Lab  08/05/23 0950 08/06/23 0507  NA 138 141  K 4.1 3.9  CL 101 107  CO2 22 20*  GLUCOSE 217* 100*  BUN 23 26*  CREATININE 1.08* 1.11*  CALCIUM 10.3 9.4   GFR: Estimated Creatinine Clearance: 37.2 mL/min (A) (by C-G formula based on SCr of 1.11 mg/dL (H)). Liver Function Tests: Recent Labs  Lab 08/05/23 0950  AST 37  ALT 44  ALKPHOS 81  BILITOT 1.0  PROT 7.6  ALBUMIN 4.0   No results for input(s): "LIPASE", "AMYLASE" in the last 168 hours. No results for input(s): "AMMONIA" in the last 168 hours. Coagulation Profile: No results for input(s): "INR", "PROTIME" in the last 168 hours. Cardiac Enzymes: No results for input(s): "CKTOTAL", "CKMB", "CKMBINDEX", "TROPONINI" in the last 168 hours. BNP (last 3 results) No results for input(s): "PROBNP" in the last 8760 hours. HbA1C: No results for input(s): "HGBA1C" in the last 72 hours. CBG: Recent Labs  Lab 08/05/23 1958 08/05/23 2318 08/06/23 0730 08/06/23 1302  GLUCAP 124* 95 78 134*   Lipid Profile: No results for input(s): "CHOL", "HDL", "LDLCALC", "TRIG", "CHOLHDL", "LDLDIRECT" in the last 72 hours. Thyroid Function Tests: No results for input(s): "TSH", "T4TOTAL", "FREET4", "T3FREE", "THYROIDAB" in the last 72 hours. Anemia Panel: No results for input(s): "VITAMINB12", "FOLATE", "FERRITIN", "TIBC", "IRON", "RETICCTPCT" in the last 72 hours. Sepsis Labs: Recent Labs  Lab 08/05/23 1146  LATICACIDVEN 1.4    Recent Results (from the past 240 hour(s))  Blood culture (routine x 2)     Status: None (Preliminary result)   Collection Time: 08/05/23 11:46 AM   Specimen: BLOOD RIGHT ARM  Result Value Ref Range Status   Specimen Description BLOOD RIGHT ARM  Final   Special Requests   Final    BOTTLES DRAWN AEROBIC AND ANAEROBIC Blood Culture results may not be optimal due to an inadequate volume of blood received in culture bottles   Culture   Final    NO GROWTH < 24 HOURS Performed at [facility], [address], [place], [place] [address]    Report Status PENDING  Incomplete  Blood culture (routine x 2)     Status: None (Preliminary result)   Collection Time: 08/05/23 11:46 AM   Specimen: BLOOD LEFT HAND  Result Value Ref Range Status   Specimen Description BLOOD LEFT HAND  Final   Special Requests   Final    BOTTLES DRAWN AEROBIC AND ANAEROBIC Blood Culture results may not be optimal due to an inadequate volume of blood received in culture bottles   Culture   Final    NO GROWTH < 24 HOURS Performed at [facility], [address] [place], [place] [address]    Report Status PENDING  Incomplete  Resp panel by RT-PCR (RSV, Flu A&B, Covid) Anterior Nasal Swab     Status: None   Collection Time: 08/05/23 11:47 AM   Specimen: Anterior Nasal Swab  Result Value Ref Range Status   SARS Coronavirus 2 by RT PCR NEGATIVE NEGATIVE Final    Comment: (NOTE) SARS-CoV-2 target nucleic acids are NOT  DETECTED.  The SARS-CoV-2 RNA is generally detectable in upper respiratory specimens during the acute phase of infection. The lowest concentration of SARS-CoV-2 viral copies this assay can detect is 138 copies/mL. A negative result does not preclude SARS-Cov-2 infection and should not be used as the sole basis for treatment or other patient management decisions. A negative result may occur with  improper specimen collection/handling, submission of specimen other than nasopharyngeal swab, presence of viral mutation(s) within the areas targeted by this assay, and inadequate number of viral copies(<138 copies/mL). A negative result must be combined with clinical observations, patient history, and epidemiological information. The expected result is Negative.  Fact Sheet for Patients:  BloggerCourse.com  Fact Sheet for Healthcare Providers:  SeriousBroker.it  This test is no t yet approved or cleared by the [place] FDA and  has  been authorized for detection and/or diagnosis of SARS-CoV-2 by FDA under an Emergency Use Authorization (EUA). This EUA will remain  in effect (meaning this test can be used) for the duration of the COVID-19 declaration under Section 564(b)(1) of the Act, 21 U.S.C.section 360bbb-3(b)(1), unless the authorization is terminated  or revoked sooner.       Influenza A by PCR NEGATIVE NEGATIVE Final   Influenza B by PCR NEGATIVE NEGATIVE Final    Comment: (NOTE) The Xpert Xpress SARS-CoV-2/FLU/RSV plus assay is intended as an aid in the diagnosis of influenza from Nasopharyngeal swab specimens and should not be used as a sole basis for treatment. Nasal washings and aspirates are unacceptable for Xpert Xpress SARS-CoV-2/FLU/RSV testing.  Fact Sheet for Patients: BloggerCourse.com  Fact Sheet for Healthcare Providers: SeriousBroker.it  This test is not yet approved or cleared by the [place] FDA and has been authorized for detection and/or diagnosis of SARS-CoV-2 by FDA under an Emergency Use Authorization (EUA). This EUA will remain in effect (meaning this test can be used) for the duration of the COVID-19 declaration under Section 564(b)(1) of the Act, 21 U.S.C. section 360bbb-3(b)(1), unless the authorization is terminated or revoked.     Resp Syncytial Virus by PCR NEGATIVE NEGATIVE Final    Comment: (NOTE) Fact Sheet for Patients: BloggerCourse.com  Fact Sheet for Healthcare Providers: SeriousBroker.it  This test is not yet approved or cleared by the [place] FDA and has been authorized for detection and/or diagnosis of SARS-CoV-2 by FDA under an Emergency Use Authorization (EUA). This EUA will remain in effect (meaning this test can be used) for the duration of the COVID-19 declaration under Section 564(b)(1) of the Act, 21 U.S.C. section 360bbb-3(b)(1), unless the  authorization is terminated or revoked.  Performed at [facility], [address] [place], [place] [address]          Radiology Studies: MR BRAIN WO CONTRAST  Result Date: 08/05/2023 CLINICAL DATA:  Fall EXAM: MRI HEAD WITHOUT CONTRAST TECHNIQUE: Multiplanar, multiecho pulse sequences of the brain and surrounding structures were obtained without intravenous contrast. COMPARISON:  None Available. FINDINGS: Brain: No acute infarct, mass effect or extra-axial collection. No acute or chronic hemorrhage. There is multifocal hyperintense T2-weighted signal within the white matter. Generalized volume loss. The midline structures are normal. Vascular: Normal flow voids. Skull and upper cervical spine: Normal calvarium and skull base. Visualized upper cervical spine and soft tissues are normal. Sinuses/Orbits:No paranasal sinus fluid levels or advanced mucosal thickening. No mastoid or middle ear effusion. Normal orbits. IMPRESSION: 1. No acute intracranial abnormality. 2. Findings of chronic small vessel ischemia and generalized volume loss. If Electronically Signed  By: Deatra Robinson M.D.   On: 08/05/2023 21:53   DG Chest Portable 1 View  Result Date: 08/05/2023 CLINICAL DATA:  Weakness.  Recent fall. EXAM: PORTABLE CHEST 1 VIEW COMPARISON:  11/15/2022. FINDINGS: Low lung volume. Bilateral lung fields are clear. Bilateral costophrenic angles are clear. Stable cardio-mediastinal silhouette. No acute osseous abnormalities. The soft tissues are within normal limits. IMPRESSION: No active disease. Electronically Signed   By: Jules Schick M.D.   On: 08/05/2023 14:22   CT HEAD WO CONTRAST ( )  Result Date: 08/05/2023 CLINICAL DATA:  ams EXAM: CT HEAD WITHOUT CONTRAST TECHNIQUE: Contiguous axial images were obtained from the base of the skull through the vertex without intravenous contrast. RADIATION DOSE REDUCTION: This exam was performed according to the departmental dose-optimization  program which includes automated exposure control, adjustment of the mA and/or kV according to patient size and/or use of iterative reconstruction technique. COMPARISON:  CT Head 11/15/22 FINDINGS: Brain: No evidence of acute infarction, hemorrhage, hydrocephalus, extra-axial collection or mass lesion/mass effect. There is sequela of moderate chronic microvascular ischemic change. Vascular: No hyperdense vessel or unexpected calcification. Skull: Normal. Negative for fracture or focal lesion. Sinuses/Orbits: No middle ear or mastoid effusion. Paranasal sinuses clear. Orbits are unremarkable. Other: None. IMPRESSION: No acute intracranial abnormality. Electronically Signed   By: Lorenza Cambridge M.D.   On: 08/05/2023 12:20        Scheduled Meds:  allopurinol  100 mg Oral BID   carvedilol  6.25 mg Oral BID WC   cyanocobalamin  1,000 mcg Oral Daily   donepezil  10 mg Oral Daily   enoxaparin (LOVENOX) injection  40 mg Subcutaneous Q24H   icosapent Ethyl  2 g Oral BID   insulin aspart  0-15 Units Subcutaneous TID WC   insulin aspart  0-5 Units Subcutaneous QHS   losartan  25 mg Oral Daily   memantine  10 mg Oral BID   QUEtiapine  100 mg Oral QHS   rosuvastatin  20 mg Oral Daily   sertraline  50 mg Oral Daily   Continuous Infusions:  sodium chloride 100 mL/hr at 08/06/23 1003     LOS: 0 days    Tresa Moore, MD [facility]   If 7PM-7AM, please contact night-coverage  08/06/2023, 1:34 PM

## 2023-08-06 NOTE — Evaluation (Signed)
Occupational Therapy Evaluation Patient Details Name: Lindsey [name] MRN: [id number] DOB: [birth year] Today's Date: 08/06/2023   History of Present Illness Lindsey [name] is a 72 y.o. female with medical history significant of dementia with behavioral disturbance, type 2 diabetes, hypertension, hyperlipidemia, depression, osteopenia who lives alone who was brought in for weakness and falls. Patient unable to provide accurate history.  History obtained by speaking with the ER physician and reviewing the medical record.  Patient lives independently and does all of her activities of daily living with family support close by.  Family noted her to be increasingly weak and confused and patient had a witnessed fall.  Unclear if this was syncopal event or not.   Clinical Impression   Patient received for OT evaluation. See flowsheet below for details of function. Generally, patient requiring MOD A for bed mobility, CGA-MIN A with RW for functional mobility, and set up-MAX A for ADLs. Significantly limited by AMS; unclear what pt's baseline is; no family available. Patient will benefit from continued OT while in acute care.      If plan is discharge home, recommend the following: A lot of help with walking and/or transfers;A lot of help with bathing/dressing/bathroom;Assistance with cooking/housework;Assistance with feeding;Direct supervision/assist for medications management;Direct supervision/assist for financial management;Assist for transportation;Help with stairs or ramp for entrance;Supervision due to cognitive status    Functional Status Assessment  Patient has had a recent decline in their functional status and demonstrates the ability to make significant improvements in function in a reasonable and predictable amount of time.  Equipment Recommendations  Other (comment) (defer to next venue of care)    Recommendations for Other Services       Precautions / Restrictions  Precautions Precautions: Fall;Other (comment) (AMS) Restrictions Weight Bearing Restrictions: No      Mobility Bed Mobility Overal bed mobility: Needs Assistance Bed Mobility: Supine to Sit, Sit to Supine     Supine to sit: Mod assist Sit to supine: Min assist   General bed mobility comments: Once back in bed, pt in sidelying and unable to follow cues to scoot up in bed; required assist of +2 dependent to scoot up in bed. Pt still seeming to prefer sidelying on R side, even when OT attempted to reposition pt for improved eating posture.    Transfers                   General transfer comment: Pt did not t/f to standing with OT this session. Was observed mobilizing with RN briefly at beginning of session.      Balance Overall balance assessment: Mild deficits observed, not formally tested                                         ADL either performed or assessed with clinical judgement   ADL Overall ADL's : Needs assistance/impaired Eating/Feeding: Supervision/ safety;Set up;Cueing for sequencing;Bed level Eating/Feeding Details (indicate cue type and reason): Pt semi-reclined in bed, requiring set up and cues for eating breakfast; pt able to answer simple questions about whether she likes certain food items.                 Lower Body Dressing: Maximal assistance Lower Body Dressing Details (indicate cue type and reason): attempted to engage pt in LB dressing (changing from her regular socks to non-skid socks) at edge of bed, but pt stating  she is fatigued and she then rolled over onto the bed into sidelying.   Toilet Transfer Details (indicate cue type and reason): pt returned from toileting with RN at beginning of OT session; required assist from RN for safe t/f.         Functional mobility during ADLs: Contact guard assist;Minimal assistance;Rolling walker (2 wheels) (Pt observed walked towards bed with RN at beginning of session.) General  ADL Comments: Pt difficult to engage in ADLs this morning (besides eating) 2/2 fatigue. Anticipate requirement for significant assist at this time 2/2 AMS, decreased sequencing, and decreased activity tolerance.     Vision Baseline Vision/History: 1 Wears glasses       Perception         Praxis Praxis: WFL       Pertinent Vitals/Pain Pain Assessment Pain Assessment: PAINAD Breathing: normal Negative Vocalization: none Facial Expression: smiling or inexpressive Body Language: relaxed Consolability: no need to console PAINAD Score: 0     Extremity/Trunk Assessment Upper Extremity Assessment Upper Extremity Assessment: Generalized weakness;Overall [facility] for tasks assessed (Pt using BIL UE for engaging in eating task)   Lower Extremity Assessment Lower Extremity Assessment: Generalized weakness (RN just mobilized pt with RW to bathroom and stated she noticed some R foot deficits; PT notified.)       Communication Communication Communication: Difficulty following commands/understanding;Difficulty communicating thoughts/reduced clarity of speech Following commands: Follows one step commands inconsistently Cueing Techniques: Verbal cues;Gestural cues;Tactile cues;Visual cues   Cognition Arousal: Alert Behavior During Therapy: WFL for tasks assessed/performed (patient wanting to rest) Overall Cognitive Status: Impaired/Different from baseline                                 General Comments: Unclear baseline. Pt able to state her name; not able to state location, situation, month, or year. Does not seem to understand OT's question when asked about the date or month. Pt requiring visual, verbal, and tactile cues for task performance. Pt feeling fatigued and is unable to tolerate attempting to mobilize with OT during fatigue. Pt very pleasant, thanking OT; pt apeparing to have mostly social automatic language and lacking more complex language skills.     General  Comments  AMS is significant barrier to pt participation.    Exercises     Shoulder Instructions      Home Living Family/patient expects to be discharged to:: Private residence Living Arrangements: Alone Available Help at Discharge: Personal care attendant;Family;Available PRN/intermittently                             Additional Comments: Medical record states that ps has a rollator. No other home set-up information available and family not present to provide info.      Prior Functioning/Environment Prior Level of Function : Patient poor historian/Family not available             Mobility Comments: Medical record states intermittent rollator use; one witnessed fall recently; unsure if other falls. ADLs Comments: Medical record states pt has PA for assist with bathing and LB dressing and daughter assists on weekends; has dementia at baseline, but sometimes is home alone.        OT Problem List: Decreased strength;Decreased activity tolerance;Impaired balance (sitting and/or standing);Decreased cognition;Decreased safety awareness      OT Treatment/Interventions: Self-care/ADL training;Therapeutic exercise;Therapeutic activities;Patient/family education    OT Goals(Current goals can be found  in the care plan section) Acute Rehab OT Goals Patient Stated Goal: go to bed OT Goal Formulation: Patient unable to participate in goal setting Time For Goal Achievement: 08/20/23 Potential to Achieve Goals: Fair ADL Goals Pt Will Perform Grooming: with modified independence;standing Pt Will Perform Lower Body Dressing: with modified independence;sit to/from stand Pt Will Transfer to Toilet: with modified independence;ambulating;regular height toilet Pt Will Perform Toileting - Clothing Manipulation and hygiene: with modified independence;sit to/from stand  OT Frequency: Min 1X/week    Co-evaluation              AM-PAC OT "6 Clicks" Daily Activity     Outcome  Measure Help from another person eating meals?: A Little Help from another person taking care of personal grooming?: A Little Help from another person toileting, which includes using toliet, bedpan, or urinal?: A Lot Help from another person bathing (including washing, rinsing, drying)?: A Lot Help from another person to put on and taking off regular upper body clothing?: A Lot Help from another person to put on and taking off regular lower body clothing?: A Lot 6 Click Score: 14   End of Session Equipment Utilized During Treatment: Rolling walker (2 wheels) Nurse Communication: Mobility status  Activity Tolerance: Patient limited by fatigue Patient left: in bed;with call bell/phone within reach;with bed alarm set  OT Visit Diagnosis: Unsteadiness on feet (R26.81);Other symptoms and signs involving cognitive function                Time: 0902-0921 OT Time Calculation (min): 19 min Charges:  OT General Charges $OT Visit: 1 Visit OT Evaluation $OT Eval Moderate Complexity: 1 Mod  Kona Yusuf Junie Panning, MS, OTR/L  Alvester Morin 08/06/2023, 12:09 PM

## 2023-08-06 NOTE — ED Notes (Signed)
Pt refused dinner tray

## 2023-08-06 NOTE — ED Notes (Signed)
Pt assisted to bathroom

## 2023-08-06 NOTE — ED Notes (Signed)
Pt ate 80% of meal.

## 2023-08-06 NOTE — Evaluation (Signed)
Physical Therapy Evaluation Patient Details Name: Lindsey [name] MRN: [id number] DOB: [birth year] Today's Date: 08/06/2023  History of Present Illness  Lindsey [name] is a 72 y.o. female with medical history significant of dementia with behavioral disturbance, type 2 diabetes, hypertension, hyperlipidemia, depression, osteopenia who lives alone who was brought in for weakness and falls. Patient unable to provide accurate history.  History obtained by speaking with the ER physician and reviewing the medical record.  Patient lives independently and does all of her activities of daily living with family support close by.  Family noted her to be increasingly weak and confused and patient had a witnessed fall.  Unclear if this was syncopal event or not.  Clinical Impression  Pt sleeping on arrival, but quick to wake and willing to participate.  She was eager to get up to EOB and to walk, but needed essentially constant cuing and redirection to maintain on task.  She transitioned to standing relatively well but showed poor awareness with walker and regarding safety in generally needing tactile and direct assist to maneuver walker and to assist with keeping her COG near the walker.  She managed to walk ~60 ft but needed constant close cuing and assist with walker.  Pt's poor overall awareness is a safety factor.        If plan is discharge home, recommend the following: A little help with walking and/or transfers;A little help with bathing/dressing/bathroom;Assistance with feeding;Assistance with cooking/housework;Direct supervision/assist for medications management;Direct supervision/assist for financial management;Assist for transportation;Help with stairs or ramp for entrance;Supervision due to cognitive status   Can travel by private vehicle   No    Equipment Recommendations  (TBD)  Recommendations for Other Services       Functional Status Assessment Patient has had a recent decline in their  functional status and demonstrates the ability to make significant improvements in function in a reasonable and predictable amount of time.     Precautions / Restrictions Precautions Precautions: Fall Restrictions Weight Bearing Restrictions: No      Mobility  Bed Mobility Overal bed mobility: Needs Assistance Bed Mobility: Supine to Sit, Sit to Supine     Supine to sit: Min assist Sit to supine: Min assist   General bed mobility comments: Pt needed some extra cuing, but ultimately made most of the effort to get up to sitting.  Light assist with LEs both getting out of and back into bed    Transfers Overall transfer level: Needs assistance Equipment used: Rolling walker (2 wheels) Transfers: Sit to/from Stand Sit to Stand: Min assist           General transfer comment: needed cuing for set up and walker use, unable to rise w/o assist but did well with minA only    Ambulation/Gait Ambulation/Gait assistance: Contact guard assist, Min assist Gait Distance (Feet): 60 Feet Assistive device: Rolling walker (2 wheels)         General Gait Details: Pt was motivated to try and walk but needed nearly constant cuing and guidance with the effort - mostly directionally and/or walker positioining.  She showed poor overall awareness needing constant redirection, but did manage straight-a-ways with less overt intervention.  Stairs            Wheelchair Mobility     Tilt Bed    Modified Rankin (Stroke Patients Only)       Balance Overall balance assessment: Mild deficits observed, not formally tested  Pertinent Vitals/Pain Pain Assessment Pain Assessment: No/denies pain    Home Living Family/patient expects to be discharged to:: Private residence Living Arrangements: Alone Available Help at Discharge: Personal care attendant;Family;Available PRN/intermittently (apparently between aides and family she  has near 24/7)             Home Equipment: Agricultural consultant (2 wheels) Additional Comments: AMS, poor historian    Prior Function Prior Level of Function : Patient poor historian/Family not available             Mobility Comments: Medical record states intermittent rollator use; one witnessed fall recently; unsure if other falls. ADLs Comments: Medical record states pt has PA for assist with bathing and LB dressing and daughter assists on weekends; has dementia at baseline, but sometimes is home alone.     Extremity/Trunk Assessment   Upper Extremity Assessment Upper Extremity Assessment: Generalized weakness    Lower Extremity Assessment Lower Extremity Assessment: Generalized weakness       Communication   Communication Communication: Difficulty following commands/understanding;Difficulty communicating thoughts/reduced clarity of speech Following commands: Follows one step commands inconsistently Cueing Techniques: Verbal cues  Cognition Arousal: Alert Behavior During Therapy: WFL for tasks assessed/performed Overall Cognitive Status: Impaired/Different from baseline                                 General Comments: Needing consistent (nearly constant) cuing and redirection t/o the session.  Verbal and tactile cues t/o.        General Comments General comments (skin integrity, edema, etc.): AMS remains a limiter    Exercises     Assessment/Plan    PT Assessment Patient needs continued PT services  PT Problem List Decreased strength;Decreased activity tolerance;Decreased balance;Decreased knowledge of use of DME;Decreased safety awareness       PT Treatment Interventions DME instruction;Gait training;Functional mobility training;Therapeutic activities;Therapeutic exercise;Balance training;Cognitive remediation;Patient/family education;Neuromuscular re-education    PT Goals (Current goals can be found in the Care Plan section)  Acute Rehab PT  Goals Patient Stated Goal: go home PT Goal Formulation: With patient Time For Goal Achievement: 08/19/23 Potential to Achieve Goals: Good    Frequency Min 2X/week     Co-evaluation               AM-PAC PT "6 Clicks" Mobility  Outcome Measure Help needed turning from your back to your side while in a flat bed without using bedrails?: A Little Help needed moving from lying on your back to sitting on the side of a flat bed without using bedrails?: A Little Help needed moving to and from a bed to a chair (including a wheelchair)?: A Little Help needed standing up from a chair using your arms (e.g., wheelchair or bedside chair)?: A Little Help needed to walk in hospital room?: A Lot Help needed climbing 3-5 steps with a railing? : A Lot 6 Click Score: 16    End of Session Equipment Utilized During Treatment: Gait belt Activity Tolerance: Patient tolerated treatment well Patient left: with call bell/phone within reach;with bed alarm set Nurse Communication: Mobility status PT Visit Diagnosis: Muscle weakness (generalized) (M62.81);Difficulty in walking, not elsewhere classified (R26.2)    Time: 4540-9811 PT Time Calculation (min) (ACUTE ONLY): 21 min   Charges:   PT Evaluation $PT Eval Low Complexity: 1 Low PT Treatments $Gait Training: 8-22 mins PT General Charges $$ ACUTE PT VISIT: 1 Visit  Malachi Pro, DPT 08/06/2023, 1:46 PM

## 2023-08-06 NOTE — ED Notes (Signed)
Provided family with update on pt's condition.

## 2023-08-07 ENCOUNTER — Inpatient Hospital Stay: Payer: 59

## 2023-08-07 DIAGNOSIS — E1129 Type 2 diabetes mellitus with other diabetic kidney complication: Secondary | ICD-10-CM | POA: Diagnosis not present

## 2023-08-07 DIAGNOSIS — F03918 Unspecified dementia, unspecified severity, with other behavioral disturbance: Secondary | ICD-10-CM | POA: Diagnosis not present

## 2023-08-07 DIAGNOSIS — W19XXXD Unspecified fall, subsequent encounter: Secondary | ICD-10-CM | POA: Diagnosis not present

## 2023-08-07 LAB — GLUCOSE, CAPILLARY
Glucose-Capillary: 179 mg/dL — ABNORMAL HIGH (ref 70–99)
Glucose-Capillary: 153 mg/dL — ABNORMAL HIGH (ref 70–99)
Glucose-Capillary: 163 mg/dL — ABNORMAL HIGH (ref 70–99)
Glucose-Capillary: 143 mg/dL — ABNORMAL HIGH (ref 70–99)

## 2023-08-07 MED ORDER — ENSURE ENLIVE PO LIQD
237.0000 mL | Freq: Two times a day (BID) | ORAL | Status: DC
  Administered 2023-08-07 – 2023-08-14 (×12): 237 mL via ORAL

## 2023-08-07 MED ORDER — ADULT MULTIVITAMIN W/MINERALS CH
1.0000 | ORAL_TABLET | Freq: Every day | ORAL | Status: DC
  Administered 2023-08-07 – 2023-08-16 (×10): 1 via ORAL
  Filled 2023-08-07 (×10): qty 1

## 2023-08-07 NOTE — Progress Notes (Signed)
There was the consult for placing a PIV access. There is only prn IV medications ordered. Talked Dr. Ashok Pall regarding placement of a PIV access. Dr. Ashok Pall is okay no PIV access at this time. HS [employer]

## 2023-08-07 NOTE — Progress Notes (Signed)
Physical Therapy Treatment Patient Details Name: Lindsey [name] MRN: [id number] DOB: 29-Nov-1950 Today's Date: 08/07/2023   History of Present Illness Lindsey [name] is a 72 y.o. female with medical history significant of dementia with behavioral disturbance, type 2 diabetes, hypertension, hyperlipidemia, depression, osteopenia who lives alone who was brought in for weakness and falls. Patient unable to provide accurate history.  History obtained by speaking with the ER physician and reviewing the medical record.  Patient lives independently and does all of her activities of daily living with family support close by.  Family noted her to be increasingly weak and confused and patient had a witnessed fall.  Unclear if this was syncopal event or not.    PT Comments  Pt pleasant and moving relatively well but continues to have poor safety and general awareness needing constant cuing, redirection, reinforcement and encouragement to remain on task.  She had some subjective fatigue (O2 to mid 90s on room air, HR up to just mid 80s) but overall did well, again with almost constant directional/safety/encouragement cuing.  Pt will benefit from further PT, continue with POC.      If plan is discharge home, recommend the following: A little help with walking and/or transfers;A little help with bathing/dressing/bathroom;Assistance with feeding;Assistance with cooking/housework;Direct supervision/assist for medications management;Direct supervision/assist for financial management;Assist for transportation;Help with stairs or ramp for entrance;Supervision due to cognitive status   Can travel by private vehicle     No  Equipment Recommendations   (TBD)    Recommendations for Other Services       Precautions / Restrictions Precautions Precautions: Fall;Other (comment) Restrictions Weight Bearing Restrictions: No     Mobility  Bed Mobility Overal bed mobility: Needs Assistance Bed Mobility: Supine to  Sit, Sit to Supine     Supine to sit: Min assist Sit to supine: Min assist   General bed mobility comments: Pt needed repeated and simple cuing, did manage to get to sit with only light assist, unable to coordinate getting back into bed needing direct assist to initiate and then transition back to supine.    Transfers Overall transfer level: Needs assistance Equipment used: Rolling walker (2 wheels) Transfers: Sit to/from Stand Sit to Stand: Min assist           General transfer comment: Pt again needing explicit and simple cuing and encouargement to initiate even minimal activity - did rise to standing with only light assist to attain standing    Ambulation/Gait Ambulation/Gait assistance: Min assist, Contact guard assist Gait Distance (Feet): 185 Feet Assistive device: Rolling walker (2 wheels)         General Gait Details: Pt pleasantly motivated, was willing to do prolonged bout of of ambulation but needed frequent direct assist for walker positioning relative to COG, directional/AD steering (even after cues).  Increasing fatigue with more prolonged effort but willing to try as asked.   Stairs             Wheelchair Mobility     Tilt Bed    Modified Rankin (Stroke Patients Only)       Balance Overall balance assessment: Mild deficits observed, not formally tested                                          Cognition Arousal: Alert Behavior During Therapy: WFL for tasks assessed/performed Overall Cognitive Status: Impaired/Different from baseline  General Comments: Pt is not oriented; requires cues to participate in tasks, step by step cues during session.        Exercises      General Comments General comments (skin integrity, edema, etc.): pleasant but with poor awareness t/o the session      Pertinent Vitals/Pain Pain Assessment Pain Assessment: No/denies pain    Home Living                           Prior Function            PT Goals (current goals can now be found in the care plan section) Progress towards PT goals: Progressing toward goals    Frequency    Min 2X/week      PT Plan      Co-evaluation              AM-PAC PT "6 Clicks" Mobility   Outcome Measure  Help needed turning from your back to your side while in a flat bed without using bedrails?: A Little Help needed moving from lying on your back to sitting on the side of a flat bed without using bedrails?: A Little Help needed moving to and from a bed to a chair (including a wheelchair)?: A Little Help needed standing up from a chair using your arms (e.g., wheelchair or bedside chair)?: A Little Help needed to walk in hospital room?: A Lot Help needed climbing 3-5 steps with a railing? : A Lot 6 Click Score: 16    End of Session Equipment Utilized During Treatment: Gait belt Activity Tolerance: Patient tolerated treatment well Patient left: with call bell/phone within reach;with bed alarm set Nurse Communication: Mobility status PT Visit Diagnosis: Muscle weakness (generalized) (M62.81);Difficulty in walking, not elsewhere classified (R26.2)     Time: 7253-6644 PT Time Calculation (min) (ACUTE ONLY): 15 min  Charges:    $Gait Training: 8-22 mins PT General Charges $$ ACUTE PT VISIT: 1 Visit                     Malachi Pro, DPT 08/07/2023, 4:52 PM

## 2023-08-07 NOTE — Progress Notes (Addendum)
Mobility Specialist - Progress Note   08/07/23 1100  Mobility  Activity Ambulated with assistance in hallway  Level of Assistance Contact guard assist, steadying assist  Assistive Device Front wheel walker  Distance Ambulated (ft) 90 ft  Activity Response Tolerated well  $Mobility charge 1 Mobility     Pt lying in bed upon arrival, utilizing RA. Pt pleasantly confused, but agreeable to activity. Completed bed mobility minA + multimodal cueing to initiate task. STS and ambulation with CGA. No LOB. VC for staying close/inside RW. Follows commands intermittently. VC for safe transfer to return supine. Pt left in bed with alarm set, needs in reach.    Filiberto Pinks Mobility Specialist 08/07/23, 11:35 AM

## 2023-08-07 NOTE — Progress Notes (Signed)
PROGRESS NOTE    Lindsey [name]  YQM:[id number] DOB: [birth year] DOA: 08/05/2023 PCP: Alba Cory, MD    Brief Narrative:   72 y.o. female with medical history significant of dementia with behavioral disturbance, type 2 diabetes, hypertension, hyperlipidemia, depression, osteopenia who lives alone who was brought in for weakness and falls.   Patient unable to provide accurate history.  History obtained by speaking with the ER physician and reviewing the medical record.  Patient lives independently and does all of her activities of daily living with family support close by.  Family noted her to be increasingly weak and confused and patient had a witnessed fall.  Unclear if this was syncopal event or not.  Patient was brought to the ED for further evaluation.   Assessment & Plan:   Principal Problem:   Fall  Ambulatory dysfunction Family reports trouble getting out of bed, slid out of chair at home as well. Normally able to ambulate without assistance but does have a walker. Mri rain negative. Is retaining urine today - will check mri of lumbar spine - pt is consulted  Acute urinary retention Is on some meds below that can contribute but they all appear chronic - I/o cath x1 and monitor - mri lumbar spine as above    Dementia with behavioral disturbance, Alzheimer's type With fluctuating delirium as well Continue sertraline 50 mg daily Continue donepezil 10 mg daily Continue memantine 10 mg daily Patient is documented taking sertraline 100 mg 3 times daily.  Given acute encephalopathy and alteration from baseline will reduce dose to 100 mg nightly   Hyperlipidemia PTA statin   Essential hypertension PTA Coreg PT Cozaar   History of gout PTA allopurinol   B12 deficiency B12 tablets    DVT prophylaxis: Lovenox Code Status: Full Family Communication: son updated telephonically 10/23 Disposition Plan: inpt     Level of care: Telemetry Medical  Consultants:   None  Procedures:  None  Antimicrobials: None   Subjective: Seen and examined.  No complaints. Tolerating diet. Denies pain  Objective: Vitals:   08/06/23 1732 08/06/23 1817 08/06/23 2004 08/07/23 0450  BP: (!) 111/55 (!) 109/58 113/70 (!) 140/72  Pulse: (!) 111 (!) 116 100 (!) 101  Resp: 15 17 16    Temp:  98.5 F (36.9 C) 99.1 F (37.3 C) 98.6 F (37 C)  TempSrc:      SpO2: 95% 93% 94% 98%  Weight:      Height:        Intake/Output Summary (Last 24 hours) at 08/07/2023 1212 Last data filed at 08/07/2023 1100 Gross per 24 hour  Intake --  Output 0 ml  Net 0 ml   Filed Weights   08/05/23 0943  Weight: 65.8 kg    Examination:  General exam: Appears calm and comfortable  Respiratory system: Clear to auscultation. Respiratory effort normal. Cardiovascular system: S1-S2 2, tachycardic, regular rhythm, no murmurs Gastrointestinal system: Soft, NT/ND  Central nervous system: Alert and oriented to self and city. Calm. Moving all 4 Extremities: warm, no edema Skin: No visible rashes, lesions or ulcers Psychiatry: pleasantly confused    Data Reviewed: I have personally reviewed following labs and imaging studies  CBC: Recent Labs  Lab 08/05/23 0950 08/06/23 0507  WBC 13.4* 8.6  HGB 14.4 11.0*  HCT 43.4 35.7*  MCV 87.0 93.0  PLT 133* 158   Basic Metabolic Panel: Recent Labs  Lab 08/05/23 0950 08/06/23 0507  NA 138 141  K 4.1 3.9  CL 101  107  CO2 22 20*  GLUCOSE 217* 100*  BUN 23 26*  CREATININE 1.08* 1.11*  CALCIUM 10.3 9.4   GFR: Estimated Creatinine Clearance: 37.2 mL/min (A) (by C-G formula based on SCr of 1.11 mg/dL (H)). Liver Function Tests: Recent Labs  Lab 08/05/23 0950  AST 37  ALT 44  ALKPHOS 81  BILITOT 1.0  PROT 7.6  ALBUMIN 4.0   No results for input(s): "LIPASE", "AMYLASE" in the last 168 hours. No results for input(s): "AMMONIA" in the last 168 hours. Coagulation Profile: No results for input(s): "INR", "PROTIME" in  the last 168 hours. Cardiac Enzymes: No results for input(s): "CKTOTAL", "CKMB", "CKMBINDEX", "TROPONINI" in the last 168 hours. BNP (last 3 results) No results for input(s): "PROBNP" in the last 8760 hours. HbA1C: No results for input(s): "HGBA1C" in the last 72 hours. CBG: Recent Labs  Lab 08/06/23 0730 08/06/23 1302 08/06/23 1659 08/06/23 2043 08/07/23 0949  GLUCAP 78 134* 103* 130* 179*   Lipid Profile: No results for input(s): "CHOL", "HDL", "LDLCALC", "TRIG", "CHOLHDL", "LDLDIRECT" in the last 72 hours. Thyroid Function Tests: No results for input(s): "TSH", "T4TOTAL", "FREET4", "T3FREE", "THYROIDAB" in the last 72 hours. Anemia Panel: No results for input(s): "VITAMINB12", "FOLATE", "FERRITIN", "TIBC", "IRON", "RETICCTPCT" in the last 72 hours. Sepsis Labs: Recent Labs  Lab 08/05/23 1146  LATICACIDVEN 1.4    Recent Results (from the past 240 hour(s))  Blood culture (routine x 2)     Status: None (Preliminary result)   Collection Time: 08/05/23 11:46 AM   Specimen: BLOOD RIGHT ARM  Result Value Ref Range Status   Specimen Description BLOOD RIGHT ARM  Final   Special Requests   Final    BOTTLES DRAWN AEROBIC AND ANAEROBIC Blood Culture results may not be optimal due to an inadequate volume of blood received in culture bottles   Culture   Final    NO GROWTH 2 DAYS Performed at [facility], [address], [place], [place] [address]    Report Status PENDING  Incomplete  Blood culture (routine x 2)     Status: None (Preliminary result)   Collection Time: 08/05/23 11:46 AM   Specimen: BLOOD LEFT HAND  Result Value Ref Range Status   Specimen Description BLOOD LEFT HAND  Final   Special Requests   Final    BOTTLES DRAWN AEROBIC AND ANAEROBIC Blood Culture results may not be optimal due to an inadequate volume of blood received in culture bottles   Culture   Final    NO GROWTH 2 DAYS Performed at [facility], [address], [place],  [place] [address]    Report Status PENDING  Incomplete  Resp panel by RT-PCR (RSV, Flu A&B, Covid) Anterior Nasal Swab     Status: None   Collection Time: 08/05/23 11:47 AM   Specimen: Anterior Nasal Swab  Result Value Ref Range Status   SARS Coronavirus 2 by RT PCR NEGATIVE NEGATIVE Final    Comment: (NOTE) SARS-CoV-2 target nucleic acids are NOT DETECTED.  The SARS-CoV-2 RNA is generally detectable in upper respiratory specimens during the acute phase of infection. The lowest concentration of SARS-CoV-2 viral copies this assay can detect is 138 copies/mL. A negative result does not preclude SARS-Cov-2 infection and should not be used as the sole basis for treatment or other patient management decisions. A negative result may occur with  improper specimen collection/handling, submission of specimen other than nasopharyngeal swab, presence of viral mutation(s) within the areas targeted by this assay,  and inadequate number of viral copies(<138 copies/mL). A negative result must be combined with clinical observations, patient history, and epidemiological information. The expected result is Negative.  Fact Sheet for Patients:  BloggerCourse.com  Fact Sheet for Healthcare Providers:  SeriousBroker.it  This test is no t yet approved or cleared by the [place] FDA and  has been authorized for detection and/or diagnosis of SARS-CoV-2 by FDA under an Emergency Use Authorization (EUA). This EUA will remain  in effect (meaning this test can be used) for the duration of the COVID-19 declaration under Section 564(b)(1) of the Act, 21 U.S.C.section 360bbb-3(b)(1), unless the authorization is terminated  or revoked sooner.       Influenza A by PCR NEGATIVE NEGATIVE Final   Influenza B by PCR NEGATIVE NEGATIVE Final    Comment: (NOTE) The Xpert Xpress SARS-CoV-2/FLU/RSV plus assay is intended as an aid in the diagnosis of influenza from  Nasopharyngeal swab specimens and should not be used as a sole basis for treatment. Nasal washings and aspirates are unacceptable for Xpert Xpress SARS-CoV-2/FLU/RSV testing.  Fact Sheet for Patients: BloggerCourse.com  Fact Sheet for Healthcare Providers: SeriousBroker.it  This test is not yet approved or cleared by the [place] FDA and has been authorized for detection and/or diagnosis of SARS-CoV-2 by FDA under an Emergency Use Authorization (EUA). This EUA will remain in effect (meaning this test can be used) for the duration of the COVID-19 declaration under Section 564(b)(1) of the Act, 21 U.S.C. section 360bbb-3(b)(1), unless the authorization is terminated or revoked.     Resp Syncytial Virus by PCR NEGATIVE NEGATIVE Final    Comment: (NOTE) Fact Sheet for Patients: BloggerCourse.com  Fact Sheet for Healthcare Providers: SeriousBroker.it  This test is not yet approved or cleared by the [place] FDA and has been authorized for detection and/or diagnosis of SARS-CoV-2 by FDA under an Emergency Use Authorization (EUA). This EUA will remain in effect (meaning this test can be used) for the duration of the COVID-19 declaration under Section 564(b)(1) of the Act, 21 U.S.C. section 360bbb-3(b)(1), unless the authorization is terminated or revoked.  Performed at [facility], [address] [place], [place] [address]          Radiology Studies: MR BRAIN WO CONTRAST  Result Date: 08/05/2023 CLINICAL DATA:  Fall EXAM: MRI HEAD WITHOUT CONTRAST TECHNIQUE: Multiplanar, multiecho pulse sequences of the brain and surrounding structures were obtained without intravenous contrast. COMPARISON:  None Available. FINDINGS: Brain: No acute infarct, mass effect or extra-axial collection. No acute or chronic hemorrhage. There is multifocal hyperintense T2-weighted  signal within the white matter. Generalized volume loss. The midline structures are normal. Vascular: Normal flow voids. Skull and upper cervical spine: Normal calvarium and skull base. Visualized upper cervical spine and soft tissues are normal. Sinuses/Orbits:No paranasal sinus fluid levels or advanced mucosal thickening. No mastoid or middle ear effusion. Normal orbits. IMPRESSION: 1. No acute intracranial abnormality. 2. Findings of chronic small vessel ischemia and generalized volume loss. If Electronically Signed   By: Deatra Robinson M.D.   On: 08/05/2023 21:53        Scheduled Meds:  allopurinol  100 mg Oral BID   carvedilol  6.25 mg Oral BID WC   cyanocobalamin  1,000 mcg Oral Daily   donepezil  10 mg Oral Daily   enoxaparin (LOVENOX) injection  40 mg Subcutaneous Q24H   icosapent Ethyl  2 g Oral BID   insulin aspart  0-15 Units Subcutaneous TID WC  insulin aspart  0-5 Units Subcutaneous QHS   losartan  25 mg Oral Daily   memantine  10 mg Oral BID   QUEtiapine  100 mg Oral TID   rosuvastatin  20 mg Oral Daily   sertraline  50 mg Oral Daily   Continuous Infusions:     LOS: 1 day    Silvano Bilis, MD [facility]   If 7PM-7AM, please contact night-coverage  08/07/2023, 12:12 PM

## 2023-08-07 NOTE — Plan of Care (Signed)

## 2023-08-07 NOTE — Progress Notes (Signed)
Bladder scan registered over  Orders to I and O per policy once per MD.  output in catheter Yellow urine.  Pt tolerated well.

## 2023-08-07 NOTE — Progress Notes (Signed)
Initial Nutrition Assessment  DOCUMENTATION CODES:   Not applicable  INTERVENTION:   -Liberalize diet to regular for wider variety of meal selections -Ensure Enlive po BID, each supplement provides 350 kcal and 20 grams of protein.  -MVI with minerals daily  NUTRITION DIAGNOSIS:   Increased nutrient needs related to chronic illness (dementia) as evidenced by estimated needs.  GOAL:   Patient will meet greater than or equal to 90% of their needs  MONITOR:   PO intake, Supplement acceptance  REASON FOR ASSESSMENT:   Consult Assessment of nutrition requirement/status  ASSESSMENT:   Pt with medical history significant of dementia with behavioral disturbance, type 2 diabetes, hypertension, hyperlipidemia, depression, osteopenia who lives alone who was brought in for weakness and falls.  Pt admitted with fall, weakness, AKI, and metabolic encephalopathy.   Reviewed I/O's: -850 ml x 24 hours  Pt sitting up in bed at time of visit. Pt pleasant, but unable to provide accurate history. Pt shares that she eats "7 times per day" PTA.   Per RN, pt ate fairly well at breakfast today and has been able to take her medications.   Reviewed wt hx; wt has been stable over the past 6 months.   RN confirmed pt lives alone, but multiple family members assist.   Medications reviewed and include vitamin B-12 and lovenox.   Lab Results  Component Value Date   HGBA1C 7.2 (A) 05/06/2023   PTA DM medications are 25 mg jardiance daily, 1000 mg metformin BID, and 50 mg [place] daily. Per ADA's Standards of Medical Care of Diabetes, glycemic targets for older adults who have multiple co-morbidities, cognitive impairments, and functional dependence should be less stringent (Hgb A1c <8.0-8.5).    Labs reviewed: CBGS: 78-179 (inpatient orders for glycemic control are 0-15 units insulin aspart TID with meals and 0-5 units insulin aspart daily at bedtime).    NUTRITION - FOCUSED PHYSICAL  EXAM:  Flowsheet Row Most Recent Value  Orbital Region No depletion  Upper Arm Region Mild depletion  Thoracic and Lumbar Region No depletion  Buccal Region No depletion  Temple Region No depletion  Clavicle Bone Region No depletion  Clavicle and Acromion Bone Region No depletion  Scapular Bone Region No depletion  Dorsal Hand Mild depletion  Patellar Region Mild depletion  Anterior Thigh Region Mild depletion  Posterior Calf Region Mild depletion  Edema (RD Assessment) None  Hair Reviewed  Eyes Reviewed  Mouth Reviewed  Skin Reviewed  Nails Reviewed       Diet Order:   Diet Order             Diet regular Room service appropriate? Yes; Fluid consistency: Thin  Diet effective now                   EDUCATION NEEDS:   Education needs have been addressed  Skin:  Skin Assessment: Reviewed RN Assessment  Last BM:  08/05/23  Height:   Ht Readings from Last 1 Encounters:  08/05/23 4\' 11"  (1.499 m)    Weight:   Wt Readings from Last 1 Encounters:  08/05/23 65.8 kg    Ideal Body Weight:  44.5 kg  BMI:  Body mass index is 29.29 kg/m.  Estimated Nutritional Needs:   Kcal:  1300-1500  Protein:  55-70 grams  Fluid:  > 1.3 L    Levada Schilling, RD, LDN, CDCES Registered Dietitian III Certified Diabetes Care and Education Specialist Please refer to [facility] for RD and/or RD on-call/weekend/after hours pager

## 2023-08-07 NOTE — Progress Notes (Signed)
Occupational Therapy Treatment Patient Details Name: ELAYJAH VIGNEAULT MRN: [id number] DOB: 08-28-1951 Today's Date: 08/07/2023   History of present illness MAXIE COLVERT is a 72 y.o. female with medical history significant of dementia with behavioral disturbance, type 2 diabetes, hypertension, hyperlipidemia, depression, osteopenia who lives alone who was brought in for weakness and falls. Patient unable to provide accurate history.  History obtained by speaking with the ER physician and reviewing the medical record.  Patient lives independently and does all of her activities of daily living with family support close by.  Family noted her to be increasingly weak and confused and patient had a witnessed fall.  Unclear if this was syncopal event or not.   OT comments  Pt received semi-reclined in bed; having finished some of her breakfast on the tray in front of her. Appearing alert; willing to work with OT on grooming. T/f MIN A to EOB. See flowsheet below for further details of session. Left semi-reclined, bed alarm on, with all needs in reach.  Patient will benefit from continued OT while in acute care.      If plan is discharge home, recommend the following:  A lot of help with walking and/or transfers;A lot of help with bathing/dressing/bathroom;Assistance with cooking/housework;Assistance with feeding;Direct supervision/assist for medications management;Direct supervision/assist for financial management;Assist for transportation;Help with stairs or ramp for entrance;Supervision due to cognitive status   Equipment Recommendations  Other (comment) (defer)    Recommendations for Other Services      Precautions / Restrictions Precautions Precautions: Fall;Other (comment) (AMS) Restrictions Weight Bearing Restrictions: No       Mobility Bed Mobility Overal bed mobility: Needs Assistance Bed Mobility: Supine to Sit, Sit to Supine     Supine to sit: Min assist Sit to supine: Contact  guard assist   General bed mobility comments: Cues for use of bed rail and scooting forward in supine to sit t/f.    Transfers Overall transfer level: Needs assistance Equipment used: Rolling walker (2 wheels) Transfers: Sit to/from Stand Sit to Stand: Min assist           General transfer comment: Cues for hand placement. Pt able to step forward towards the sink with RW, but then reports fatigue and needing to sit for ADL's, so stepped back with cues and MIN A to safely return to sitting EOB.     Balance Overall balance assessment: Mild deficits observed, not formally tested                                         ADL either performed or assessed with clinical judgement   ADL Overall ADL's : Needs assistance/impaired     Grooming: Oral care;Wash/dry face;Supervision/safety;Set up;Cueing for sequencing;Sitting Grooming Details (indicate cue type and reason): Requires cues for participation in task; attempted task in standing, but pt stating she is fatigued and wanted to sit back down             Lower Body Dressing: Total assistance;Bed level Lower Body Dressing Details (indicate cue type and reason): for donning socks today             Functional mobility during ADLs: Minimal assistance;Rolling walker (2 wheels) General ADL Comments: More alert and participatory than yesterday; continues to need significant cues with step-by-step instruction.    Extremity/Trunk Assessment Upper Extremity Assessment Upper Extremity Assessment: Generalized weakness   Lower Extremity Assessment  Lower Extremity Assessment: Generalized weakness        Vision       Perception     Praxis      Cognition Arousal: Alert Behavior During Therapy: WFL for tasks assessed/performed Overall Cognitive Status: Impaired/Different from baseline                                 General Comments: Pt is not oriented; requires cues to participate in tasks,  step by step cues during session.        Exercises      Shoulder Instructions       General Comments On room air. Mental status remains a barrier, but pt participating better today compared to yesterday; still appearing with decreased insight and decreased desire to participate, but is friendly and agreeable when given step-by-step instructions.    Pertinent Vitals/ Pain       Pain Assessment Pain Assessment: No/denies pain  Home Living                                          Prior Functioning/Environment              Frequency  Min 1X/week        Progress Toward Goals  OT Goals(current goals can now be found in the care plan section)  Progress towards OT goals: Progressing toward goals  Acute Rehab OT Goals Patient Stated Goal: Get rest OT Goal Formulation: Patient unable to participate in goal setting Time For Goal Achievement: 08/20/23 Potential to Achieve Goals: Fair ADL Goals Pt Will Perform Grooming: with modified independence;standing Pt Will Perform Lower Body Dressing: with modified independence;sit to/from stand Pt Will Transfer to Toilet: with modified independence;ambulating;regular height toilet Pt Will Perform Toileting - Clothing Manipulation and hygiene: with modified independence;sit to/from stand  Plan      Co-evaluation                 AM-PAC OT "6 Clicks" Daily Activity     Outcome Measure   Help from another person eating meals?: A Little Help from another person taking care of personal grooming?: A Little Help from another person toileting, which includes using toliet, bedpan, or urinal?: A Lot Help from another person bathing (including washing, rinsing, drying)?: A Lot Help from another person to put on and taking off regular upper body clothing?: A Lot Help from another person to put on and taking off regular lower body clothing?: A Lot 6 Click Score: 14    End of Session Equipment Utilized During  Treatment: Gait belt;Rolling walker (2 wheels)  OT Visit Diagnosis: Unsteadiness on feet (R26.81);Other symptoms and signs involving cognitive function   Activity Tolerance Patient limited by fatigue   Patient Left in bed;with call bell/phone within reach;with bed alarm set   Nurse Communication Mobility status        Time: 2725-3664 OT Time Calculation (min): 22 min  Charges: OT General Charges $OT Visit: 1 Visit OT Treatments $Self Care/Home Management : 8-22 mins  Linward Foster, MS, OTR/L  Alvester Morin 08/07/2023, 2:11 PM

## 2023-08-08 DIAGNOSIS — W19XXXD Unspecified fall, subsequent encounter: Secondary | ICD-10-CM | POA: Diagnosis not present

## 2023-08-08 DIAGNOSIS — E1129 Type 2 diabetes mellitus with other diabetic kidney complication: Secondary | ICD-10-CM | POA: Diagnosis not present

## 2023-08-08 DIAGNOSIS — F03918 Unspecified dementia, unspecified severity, with other behavioral disturbance: Secondary | ICD-10-CM | POA: Diagnosis not present

## 2023-08-08 LAB — GLUCOSE, CAPILLARY
Glucose-Capillary: 116 mg/dL — ABNORMAL HIGH (ref 70–99)
Glucose-Capillary: 195 mg/dL — ABNORMAL HIGH (ref 70–99)
Glucose-Capillary: 189 mg/dL — ABNORMAL HIGH (ref 70–99)
Glucose-Capillary: 163 mg/dL — ABNORMAL HIGH (ref 70–99)

## 2023-08-08 LAB — BASIC METABOLIC PANEL
Anion gap: 12 (ref 5–15)
BUN: 20 mg/dL (ref 8–23)
CO2: 22 mmol/L (ref 22–32)
Calcium: 9.8 mg/dL (ref 8.9–10.3)
Chloride: 105 mmol/L (ref 98–111)
Creatinine, Ser: 0.93 mg/dL (ref 0.44–1.00)
GFR, Estimated: >60 mL/min (ref >60)
Glucose, Bld: 122 mg/dL — ABNORMAL HIGH (ref 70–99)
Potassium: 3.4 mmol/L — ABNORMAL LOW (ref 3.5–5.1)
Sodium: 139 mmol/L (ref 135–145)

## 2023-08-08 NOTE — Plan of Care (Signed)

## 2023-08-08 NOTE — Plan of Care (Signed)
Patient alert & oriented to self. Eating well, took all scheduled meds. Voiding BS residuals < 5oml and LBM 10/24. Safety maintained. Nursing will continue to monitor.                                                                                                                                                                                                       Problem: Coping: Goal: Ability to adjust to condition or change in health will improve Outcome: Progressing   Problem: Fluid Volume: Goal: Ability to maintain a balanced intake and output will improve Outcome: Progressing

## 2023-08-08 NOTE — Progress Notes (Signed)
PROGRESS NOTE    Lindsey [name]  HQI:[id number] DOB: 1951/02/11 DOA: 08/05/2023 PCP: Alba Cory, MD    Brief Narrative:   72 y.o. female with medical history significant of dementia with behavioral disturbance, type 2 diabetes, hypertension, hyperlipidemia, depression, osteopenia who lives alone who was brought in for weakness and falls.   Patient unable to provide accurate history.  History obtained by speaking with the ER physician and reviewing the medical record.  Patient lives independently and does all of her activities of daily living with family support close by.  Family noted her to be increasingly weak and confused and patient had a witnessed fall.  Unclear if this was syncopal event or not.  Patient was brought to the ED for further evaluation.   Assessment & Plan:   Principal Problem:   Fall Active Problems:   Benign essential HTN   Dementia with behavioral disturbance (HCC)   Type 2 diabetes mellitus with microalbuminuria, without long-term current use of insulin (HCC)  Ambulatory dysfunction Family reports trouble getting out of bed, slid out of chair at home as well. Normally able to ambulate without assistance but does have a walker. Mri brain negative. MRI lumbar spine no signs canal stenosis or cord compression. PT advising snf - TOC consulted for SNF  Acute urinary retention On 10/23, required I/o cath x1, today urinating spontaneously, lumbar mri as above negative, PVRs neg - monitor  Chronic L1 and L3 compression fractures On MRI this admission - pain control, PT    Dementia with behavioral disturbance, Alzheimer's type With fluctuating delirium as well Continue sertraline 50 mg daily Continue donepezil 10 mg daily Continue memantine 10 mg daily Patient is documented taking sertraline 100 mg 3 times daily.  Given acute encephalopathy and alteration from baseline we have reduced dose to 100 mg nightly   Hyperlipidemia PTA statin   Essential  hypertension controlled PTA Coreg PT Cozaar   History of gout PTA allopurinol   B12 deficiency B12 tablets    DVT prophylaxis: Lovenox Code Status: Full Family Communication: son updated telephonically 10/23 Disposition Plan: inpt     Level of care: Telemetry Medical  Consultants:  None  Procedures:  None  Antimicrobials: None   Subjective: Seen and examined.  No complaints. Tolerating diet. Ongoing chronic low back pain. Able to urinate overnight and this morning. Stooled this morning.   Objective: Vitals:   08/07/23 1538 08/07/23 1941 08/08/23 0419 08/08/23 0753  BP: 118/79 112/62 (!) 142/70 133/84  Pulse: (!) 102 96 98 99  Resp: 15 16 18 18   Temp: 98.6 F (37 C) 97.9 F (36.6 C) 98.9 F (37.2 C) 97.8 F (36.6 C)  TempSrc:    Oral  SpO2: 95% 94% 95% 96%  Weight:      Height:        Intake/Output Summary (Last 24 hours) at 08/08/2023 1225 Last data filed at 08/08/2023 1324 Gross per 24 hour  Intake 300 ml  Output 1000 ml  Net -700 ml   Filed Weights   08/05/23 0943  Weight: 65.8 kg    Examination:  General exam: Appears calm and comfortable  Respiratory system: Clear to auscultation. Respiratory effort normal. Cardiovascular system: S1-S2 2, tachycardic, regular rhythm, no murmurs Gastrointestinal system: Soft, NT/ND  Central nervous system: Alert and oriented to self and city. Calm. Moving all 4. Distal sensation intact Extremities: warm, no edema Skin: No visible rashes, lesions or ulcers Psychiatry: pleasantly confused    Data Reviewed: I have personally reviewed  following labs and imaging studies  CBC: Recent Labs  Lab 08/05/23 0950 08/06/23 0507  WBC 13.4* 8.6  HGB 14.4 11.0*  HCT 43.4 35.7*  MCV 87.0 93.0  PLT 133* 158   Basic Metabolic Panel: Recent Labs  Lab 08/05/23 0950 08/06/23 0507 08/08/23 0645  NA 138 141 139  K 4.1 3.9 3.4*  CL 101 107 105  CO2 22 20* 22  GLUCOSE 217* 100* 122*  BUN 23 26* 20   CREATININE 1.08* 1.11* 0.93  CALCIUM 10.3 9.4 9.8   GFR: Estimated Creatinine Clearance: 44.4 mL/min (by C-G formula based on SCr of 0.93 mg/dL). Liver Function Tests: Recent Labs  Lab 08/05/23 0950  AST 37  ALT 44  ALKPHOS 81  BILITOT 1.0  PROT 7.6  ALBUMIN 4.0   No results for input(s): "LIPASE", "AMYLASE" in the last 168 hours. No results for input(s): "AMMONIA" in the last 168 hours. Coagulation Profile: No results for input(s): "INR", "PROTIME" in the last 168 hours. Cardiac Enzymes: No results for input(s): "CKTOTAL", "CKMB", "CKMBINDEX", "TROPONINI" in the last 168 hours. BNP (last 3 results) No results for input(s): "PROBNP" in the last 8760 hours. HbA1C: No results for input(s): "HGBA1C" in the last 72 hours. CBG: Recent Labs  Lab 08/07/23 1213 08/07/23 1536 08/07/23 1939 08/08/23 0755 08/08/23 1126  GLUCAP 153* 163* 143* 116* 195*   Lipid Profile: No results for input(s): "CHOL", "HDL", "LDLCALC", "TRIG", "CHOLHDL", "LDLDIRECT" in the last 72 hours. Thyroid Function Tests: No results for input(s): "TSH", "T4TOTAL", "FREET4", "T3FREE", "THYROIDAB" in the last 72 hours. Anemia Panel: No results for input(s): "VITAMINB12", "FOLATE", "FERRITIN", "TIBC", "IRON", "RETICCTPCT" in the last 72 hours. Sepsis Labs: Recent Labs  Lab 08/05/23 1146  LATICACIDVEN 1.4    Recent Results (from the past 240 hour(s))  Blood culture (routine x 2)     Status: None (Preliminary result)   Collection Time: 08/05/23 11:46 AM   Specimen: BLOOD RIGHT ARM  Result Value Ref Range Status   Specimen Description BLOOD RIGHT ARM  Final   Special Requests   Final    BOTTLES DRAWN AEROBIC AND ANAEROBIC Blood Culture results may not be optimal due to an inadequate volume of blood received in culture bottles   Culture   Final    NO GROWTH 3 DAYS Performed at [facility], [address] [place], [place] [address]    Report Status PENDING  Incomplete  Blood culture  (routine x 2)     Status: None (Preliminary result)   Collection Time: 08/05/23 11:46 AM   Specimen: BLOOD LEFT HAND  Result Value Ref Range Status   Specimen Description BLOOD LEFT HAND  Final   Special Requests   Final    BOTTLES DRAWN AEROBIC AND ANAEROBIC Blood Culture results may not be optimal due to an inadequate volume of blood received in culture bottles   Culture   Final    NO GROWTH 3 DAYS Performed at [facility], [address], [place], [place] [address]    Report Status PENDING  Incomplete  Resp panel by RT-PCR (RSV, Flu A&B, Covid) Anterior Nasal Swab     Status: None   Collection Time: 08/05/23 11:47 AM   Specimen: Anterior Nasal Swab  Result Value Ref Range Status   SARS Coronavirus 2 by RT PCR NEGATIVE NEGATIVE Final    Comment: (NOTE) SARS-CoV-2 target nucleic acids are NOT DETECTED.  The SARS-CoV-2 RNA is generally detectable in upper respiratory specimens during the acute phase of  infection. The lowest concentration of SARS-CoV-2 viral copies this assay can detect is 138 copies/mL. A negative result does not preclude SARS-Cov-2 infection and should not be used as the sole basis for treatment or other patient management decisions. A negative result may occur with  improper specimen collection/handling, submission of specimen other than nasopharyngeal swab, presence of viral mutation(s) within the areas targeted by this assay, and inadequate number of viral copies(<138 copies/mL). A negative result must be combined with clinical observations, patient history, and epidemiological information. The expected result is Negative.  Fact Sheet for Patients:  BloggerCourse.com  Fact Sheet for Healthcare Providers:  SeriousBroker.it  This test is no t yet approved or cleared by the [place] FDA and  has been authorized for detection and/or diagnosis of SARS-CoV-2 by FDA under an Emergency Use  Authorization (EUA). This EUA will remain  in effect (meaning this test can be used) for the duration of the COVID-19 declaration under Section 564(b)(1) of the Act, 21 U.S.C.section 360bbb-3(b)(1), unless the authorization is terminated  or revoked sooner.       Influenza A by PCR NEGATIVE NEGATIVE Final   Influenza B by PCR NEGATIVE NEGATIVE Final    Comment: (NOTE) The Xpert Xpress SARS-CoV-2/FLU/RSV plus assay is intended as an aid in the diagnosis of influenza from Nasopharyngeal swab specimens and should not be used as a sole basis for treatment. Nasal washings and aspirates are unacceptable for Xpert Xpress SARS-CoV-2/FLU/RSV testing.  Fact Sheet for Patients: BloggerCourse.com  Fact Sheet for Healthcare Providers: SeriousBroker.it  This test is not yet approved or cleared by the [place] FDA and has been authorized for detection and/or diagnosis of SARS-CoV-2 by FDA under an Emergency Use Authorization (EUA). This EUA will remain in effect (meaning this test can be used) for the duration of the COVID-19 declaration under Section 564(b)(1) of the Act, 21 U.S.C. section 360bbb-3(b)(1), unless the authorization is terminated or revoked.     Resp Syncytial Virus by PCR NEGATIVE NEGATIVE Final    Comment: (NOTE) Fact Sheet for Patients: BloggerCourse.com  Fact Sheet for Healthcare Providers: SeriousBroker.it  This test is not yet approved or cleared by the [place] FDA and has been authorized for detection and/or diagnosis of SARS-CoV-2 by FDA under an Emergency Use Authorization (EUA). This EUA will remain in effect (meaning this test can be used) for the duration of the COVID-19 declaration under Section 564(b)(1) of the Act, 21 U.S.C. section 360bbb-3(b)(1), unless the authorization is terminated or revoked.  Performed at [facility], [address] [place], [place] [address]          Radiology Studies: MR LUMBAR SPINE WO CONTRAST  Result Date: 08/08/2023 CLINICAL DATA:  72 year old female with weakness and urinary retention. Fall. EXAM: MRI LUMBAR SPINE WITHOUT CONTRAST TECHNIQUE: Multiplanar, multisequence MR imaging of the lumbar spine was performed. No intravenous contrast was administered. COMPARISON:  Lumbar spine CT 08/16/2022. FINDINGS: Segmentation: Normal with hypoplastic ribs at T12 on the comparison. Alignment: Mildly exaggerated lumbar lordosis is stable from last year. No significant scoliosis or spondylolisthesis. Vertebrae: Chronic moderate to severe L1 and L3 compression fractures appears stable and configuration from last year, and there is No marrow edema or evidence of acute osseous abnormality. Normal background bone marrow signal. Maintained other lumbar vertebral height. Anterior T11 benign vertebral hemangioma partially visible (series 7, image 9). Intact visible sacrum and SI joints. Conus medullaris and cauda equina: Conus extends to the T12-L1 level. No lower spinal cord or conus  signal abnormality. Motion degraded axial images despite repeated imaging attempts. Cauda equina nerve roots appear grossly normal. Paraspinal and other soft tissues: Motion degraded axial images despite repeated imaging attempts. Grossly negative visualized abdominal viscera and paraspinal soft tissues. Disc levels: Motion degraded axial imaging limits detail, despite repeated axial imaging attempts. Still, there is no significant lumbar spinal stenosis identified despite multilevel disc degeneration, disc bulging, facet and ligament flavum hypertrophy. However, there is degenerative lumbar neural foraminal stenosis as follows: L1-L2: Moderate to severe left L1 neural foraminal stenosis appears primarily related to broad-based foraminal disc. L2-L3: Moderate to severe bilateral L2 neural foraminal stenosis appears related to bulky foraminal  and/or far lateral disc (e.g. Series 5, image 3 on the right). L3-L4: Mild to moderate left and moderate to severe right L3 foraminal stenosis also appears primarily related to foraminal disc (series 5, image 3 on the right). L4-L5: Moderate to severe left and moderate right L4 foraminal stenosis with similar foraminal disc. IMPRESSION: 1. No acute osseous abnormality. Chronic L1 and L3 compression fractures. 2. Motion degraded axial lumbar images despite repeated imaging attempts. No significant lumbar spinal stenosis identified. However, multilevel moderate to severe neural foraminal stenosis which appears primarily related to foraminal disc herniations: Left L1, bilateral L2, right greater than left L3, left greater than right L4 nerve levels. Electronically Signed   By: Odessa Fleming M.D.   On: 08/08/2023 04:30        Scheduled Meds:  allopurinol  100 mg Oral BID   carvedilol  6.25 mg Oral BID WC   cyanocobalamin  1,000 mcg Oral Daily   donepezil  10 mg Oral Daily   feeding supplement  237 mL Oral BID BM   icosapent Ethyl  2 g Oral BID   insulin aspart  0-15 Units Subcutaneous TID WC   insulin aspart  0-5 Units Subcutaneous QHS   losartan  25 mg Oral Daily   memantine  10 mg Oral BID   multivitamin with minerals  1 tablet Oral Q lunch   QUEtiapine  100 mg Oral TID   rosuvastatin  20 mg Oral Daily   sertraline  50 mg Oral Daily   Continuous Infusions:     LOS: 2 days    Silvano Bilis, MD [facility]   If 7PM-7AM, please contact night-coverage  08/08/2023, 12:25 PM

## 2023-08-08 NOTE — Progress Notes (Signed)
Mobility Specialist - Progress Note   08/08/23 1500  Mobility  Activity Ambulated with assistance in room;Transferred from bed to chair;Transferred from chair to bed  Level of Assistance Moderate assist, patient does 50-74%  Assistive Device Front wheel walker  Distance Ambulated (ft) 20 ft  Activity Response Tolerated fair  $Mobility charge 1 Mobility     Pt lying in bed upon arrival, utilizing RA. Pt reports feeling fatigued this date. Noted soiled sheets. Pt achieved EOB with maxA + task initiation. STS and ambulation with modA. Constant cueing for corrective posture and direction. Noted R lateral lean and forward flexion in standing. Hand-over-hand assist to place and maintain UE onto RW. RW negotiation. Multimodal cueing for sequence. Pt transferred to chair for linen/gown change prior to return to bed. Assist for safety to return supine. Poor safety awareness. Pt left in chair with alarm set, needs in reach.    Filiberto Pinks Mobility Specialist 08/08/23, 3:33 PM

## 2023-08-08 NOTE — Progress Notes (Signed)
   08/07/23 2345 08/08/23 0002  Urine Characteristics  Bladder Scan Volume (mL) 545 mL 0 mL    Bladder scan resulted . Patient was assisted to [facility]. Patient voided . Repeat bladder scan shows 0mL post void

## 2023-08-09 DIAGNOSIS — W19XXXD Unspecified fall, subsequent encounter: Secondary | ICD-10-CM | POA: Diagnosis not present

## 2023-08-09 DIAGNOSIS — B338 Other specified viral diseases: Secondary | ICD-10-CM | POA: Diagnosis not present

## 2023-08-09 LAB — BASIC METABOLIC PANEL
Anion gap: 10 (ref 5–15)
BUN: 20 mg/dL (ref 8–23)
CO2: 25 mmol/L (ref 22–32)
Calcium: 10.1 mg/dL (ref 8.9–10.3)
Chloride: 104 mmol/L (ref 98–111)
Creatinine, Ser: 0.88 mg/dL (ref 0.44–1.00)
GFR, Estimated: >60 mL/min (ref >60)
Glucose, Bld: 137 mg/dL — ABNORMAL HIGH (ref 70–99)
Potassium: 3.6 mmol/L (ref 3.5–5.1)
Sodium: 139 mmol/L (ref 135–145)

## 2023-08-09 LAB — MAGNESIUM: Magnesium: 1.7 mg/dL (ref 1.7–2.4)

## 2023-08-09 LAB — CBC
HCT: 34 % — ABNORMAL LOW (ref 36.0–46.0)
Hemoglobin: 11.5 g/dL — ABNORMAL LOW (ref 12.0–15.0)
MCH: 28.7 pg (ref 26.0–34.0)
MCHC: 33.8 g/dL (ref 30.0–36.0)
MCV: 84.8 fL (ref 80.0–100.0)
Platelets: 215 10*3/uL (ref 150–400)
RBC: 4.01 MIL/uL (ref 3.87–5.11)
RDW: 13.2 % (ref 11.5–15.5)
WBC: 8.1 10*3/uL (ref 4.0–10.5)
nRBC: 0 % (ref 0.0–0.2)

## 2023-08-09 LAB — GLUCOSE, CAPILLARY
Glucose-Capillary: 143 mg/dL — ABNORMAL HIGH (ref 70–99)
Glucose-Capillary: 122 mg/dL — ABNORMAL HIGH (ref 70–99)
Glucose-Capillary: 144 mg/dL — ABNORMAL HIGH (ref 70–99)

## 2023-08-09 LAB — PHOSPHORUS: Phosphorus: 3.4 mg/dL (ref 2.5–4.6)

## 2023-08-09 NOTE — TOC Progression Note (Signed)
Transition of Care [facility]) - Progression Note    Patient Details  Name: Lindsey [name] MRN: [id number] Date of Birth: 09/19/1951  Transition of Care [facility]) CM/SW Contact  Garret Reddish, RN Phone Number: 08/09/2023, 11:26 AM  Clinical Narrative:    Chart reviewed.  Noted that patient has recommend SNF on discharge for Mrs. Pura.  I have attempted to reach out to patient's daughter on yesterday and again this morning.  I have left a message for a return call.  FL 2 completed and PASSR has flagged for a level 2.  Information will be sent to [facility] MUST for review of Clinical information.  TOC will continue to follow for discharge planning.          Expected Discharge Plan and Services                                               Social Determinants of Health (SDOH) Interventions SDOH Screenings   Food Insecurity: No Food Insecurity (08/06/2023)  Housing: Low Risk  (08/06/2023)  Transportation Needs: No Transportation Needs (08/06/2023)  Utilities: Not At Risk (08/06/2023)  Alcohol Screen: Low Risk  (08/22/2022)  Depression (PHQ2-9): Low Risk  (05/30/2023)  Financial Resource Strain: Low Risk  (01/02/2023)  Physical Activity: Unknown (01/02/2023)  Social Connections: Socially Isolated (01/02/2023)  Stress: No Stress Concern Present (01/02/2023)  Tobacco Use: Low Risk  (08/06/2023)  Recent Concern: Tobacco Use - Medium Risk (07/10/2023)   Received from [facility]    Readmission Risk Interventions     No data to display

## 2023-08-09 NOTE — Progress Notes (Signed)
RE: Lindsey [name] Date of Birth: [birth year] Date: 08/09/2023     To Whom It May Concern:   Please be advised that the above-named patient will require a short-term nursing home stay - anticipated 30 days or less for rehabilitation and strengthening.  The plan is for return home

## 2023-08-09 NOTE — [place] FL2 (Signed)
[place] MEDICAID FL2 LEVEL OF CARE FORM     IDENTIFICATION  Patient Name: Lindsey [name] Birthdate: 1951/09/06 Sex: female Admission Date (Current Location): 08/05/2023  [place] and [place] Number:  [address] [id number] R Facility and Address:  [facility], [address], [place], [place] [address]      Provider Number: [id number]  Attending Physician Name and Address:  Gillis Santa, MD  Relative Name and Phone Number:  Troy Sine [phone]    Current Level of Care: Hospital Recommended Level of Care: Skilled Nursing Facility Prior Approval Number:    Date Approved/Denied:   PASRR Number: Pending  Discharge Plan: SNF    Current Diagnoses: Patient Active Problem List   Diagnosis Date Noted   Fall 08/05/2023   Dementia due to medical condition without behavioral disturbance (HCC) 01/04/2023   RSV infection 11/15/2022   Mild protein-calorie malnutrition (HCC) 05/08/2022   Stage 3a chronic kidney disease (HCC) 05/08/2022   Type 2 diabetes mellitus with microalbuminuria, without long-term current use of insulin (HCC) 05/08/2022   Pain due to onychomycosis of toenails of both feet 05/04/2019   Dementia with behavioral disturbance (HCC) 01/09/2016   Mild dementia (HCC) 11/01/2015   Auditory hallucination 07/26/2015   Anemia of chronic disease 07/05/2015   Cataract 04/06/2015   Type 2 diabetes mellitus with diabetic cataract (HCC) 04/06/2015   Benign essential HTN 04/02/2015   Controlled gout 04/02/2015   Dyslipidemia 04/02/2015   Fibromyalgia syndrome 04/02/2015   Chronic insomnia 04/02/2015   Cognitive decline 04/02/2015   Nodular basal cell carcinoma 04/02/2015   Osteopenia 04/02/2015   Calcium blood increased 04/02/2015    Orientation RESPIRATION BLADDER Height & Weight     Self  Normal Incontinent Weight: 65.8 kg Height:  4\' 11"  (149.9 cm)  BEHAVIORAL SYMPTOMS/MOOD NEUROLOGICAL BOWEL NUTRITION STATUS      Incontinent  (See  Discharge Summary)  AMBULATORY STATUS COMMUNICATION OF NEEDS Skin   Extensive Assist Verbally Normal                       Personal Care Assistance Level of Assistance  Bathing, Feeding, Dressing Bathing Assistance: Limited assistance Feeding assistance: Limited assistance Dressing Assistance: Limited assistance     Functional Limitations Info  Sight, Hearing, Speech Sight Info: Impaired Hearing Info: Adequate Speech Info: Adequate    SPECIAL CARE FACTORS FREQUENCY  PT (By licensed PT), OT (By licensed OT)     PT Frequency: 5x weekly OT Frequency: 5x weekly            Contractures Contractures Info: Not present    Additional Factors Info  Code Status, Allergies Code Status Info: Full Code Allergies Info: Ace Inhibitors           Current Medications (08/09/2023):  This is the current hospital active medication list Current Facility-Administered Medications  Medication Dose Route Frequency Provider Last Rate Last Admin   acetaminophen (TYLENOL) tablet 650 mg  650 mg Oral Q6H PRN Lolita Patella B, MD       Or   acetaminophen (TYLENOL) suppository 650 mg  650 mg Rectal Q6H PRN Sreenath, Sudheer B, MD       albuterol (PROVENTIL) (2.5 MG/3ML) 0.083% nebulizer solution 2.5 mg  2.5 mg Nebulization Q2H PRN Sreenath, Sudheer B, MD       allopurinol (ZYLOPRIM) tablet 100 mg  100 mg Oral BID Georgeann Oppenheim, Sudheer B, MD   100 mg at 08/09/23 0933   carvedilol (COREG) tablet 6.25 mg  6.25 mg  Oral BID WC Lolita Patella B, MD   6.25 mg at 08/09/23 6578   cyanocobalamin (VITAMIN B12) tablet 1,000 mcg  1,000 mcg Oral Daily Lolita Patella B, MD   1,000 mcg at 08/09/23 0934   donepezil (ARICEPT) tablet 10 mg  10 mg Oral Daily Lolita Patella B, MD   10 mg at 08/09/23 0934   feeding supplement (ENSURE ENLIVE / ENSURE PLUS) liquid 237 mL  237 mL Oral BID BM Wouk, Wilfred Curtis, MD   237 mL at 08/09/23 0935   icosapent Ethyl (VASCEPA) 1 g capsule 2 g  2 g Oral BID Lolita Patella B, MD   2 g at 08/09/23 0934   insulin aspart (novoLOG) injection 0-15 Units  0-15 Units Subcutaneous TID WC Lolita Patella B, MD   2 Units at 08/09/23 0934   insulin aspart (novoLOG) injection 0-5 Units  0-5 Units Subcutaneous QHS Sreenath, Sudheer B, MD       losartan (COZAAR) tablet 25 mg  25 mg Oral Daily Georgeann Oppenheim, Sudheer B, MD   25 mg at 08/09/23 0934   memantine (NAMENDA) tablet 10 mg  10 mg Oral BID Lolita Patella B, MD   10 mg at 08/09/23 4696   multivitamin with minerals tablet 1 tablet  1 tablet Oral Q lunch Wouk, Wilfred Curtis, MD   1 tablet at 08/08/23 1618   ondansetron (ZOFRAN) tablet 4 mg  4 mg Oral Q6H PRN Lolita Patella B, MD       Or   ondansetron (ZOFRAN) injection 4 mg  4 mg Intravenous Q6H PRN Sreenath, Sudheer B, MD       QUEtiapine (SEROQUEL) tablet 100 mg  100 mg Oral TID Lolita Patella B, MD   100 mg at 08/09/23 0933   rosuvastatin (CRESTOR) tablet 20 mg  20 mg Oral Daily Lolita Patella B, MD   20 mg at 08/09/23 0934   sertraline (ZOLOFT) tablet 50 mg  50 mg Oral Daily Lolita Patella B, MD   50 mg at 08/09/23 2952     Discharge Medications: Please see discharge summary for a list of discharge medications.  Relevant Imaging Results:  Relevant Lab Results:   Additional Information [id number]  Garret Reddish, RN

## 2023-08-09 NOTE — Progress Notes (Signed)
PT Cancellation Note  Patient Details Name: Lindsey [name] MRN: [id number] DOB: 10/07/1951   Cancelled Treatment:     Therapist in to see pt this pm. Pt with change in MS, very lethargic, confused, and hallucinating (speaking to empty room). Significant decline from previous day, spoke with nursing who also states similar concerns and requests to hold PT session this date and await MD assessment. Will re-attempt next available date/time as appropriate.    [name] 08/09/2023, 3:09 PM

## 2023-08-09 NOTE — Progress Notes (Signed)
PROGRESS NOTE    Lindsey [name]  NGE:[id number] DOB: 01-25-1951 DOA: 08/05/2023 PCP: Alba Cory, MD    Brief Narrative:   72 y.o. female with medical history significant of dementia with behavioral disturbance, type 2 diabetes, hypertension, hyperlipidemia, depression, osteopenia who lives alone who was brought in for weakness and falls.   Patient unable to provide accurate history.  History obtained by speaking with the ER physician and reviewing the medical record.  Patient lives independently and does all of her activities of daily living with family support close by.  Family noted her to be increasingly weak and confused and patient had a witnessed fall.  Unclear if this was syncopal event or not.  Patient was brought to the ED for further evaluation.   Assessment & Plan:   Principal Problem:   Fall Active Problems:   Benign essential HTN   Dementia with behavioral disturbance (HCC)   Type 2 diabetes mellitus with microalbuminuria, without long-term current use of insulin (HCC)  Ambulatory dysfunction Family reports trouble getting out of bed, slid out of chair at home as well. Normally able to ambulate without assistance but does have a walker. Mri brain negative. MRI lumbar spine no signs canal stenosis or cord compression. PT advising snf - TOC consulted for SNF  Acute urinary retention On 10/23, required I/o cath x1, today urinating spontaneously, lumbar mri as above negative, PVRs neg - monitor  Chronic L1 and L3 compression fractures On MRI this admission - pain control, PT    Dementia with behavioral disturbance, Alzheimer's type With fluctuating delirium as well Continue sertraline 50 mg daily Continue donepezil 10 mg daily Continue memantine 10 mg daily Patient is documented taking sertraline 100 mg 3 times daily.  Given acute encephalopathy and alteration from baseline we have reduced dose to 100 mg nightly   Hyperlipidemia PTA statin   Essential  hypertension controlled PTA Coreg PT Cozaar   History of gout PTA allopurinol   B12 deficiency B12 tablets    DVT prophylaxis: Lovenox Code Status: Full Family Communication: son updated telephonically 10/23 Disposition Plan: inpt   TOC following, working on SNF placement.   Level of care: Telemetry Medical  Consultants:  None  Procedures:  None  Antimicrobials: None   Subjective: Seen and examined.  No complaints.  Patient was resting comfortably, did not offer any complaints.  Patient was dozing off during my interview.  She is AO x 1.   Objective: Vitals:   08/08/23 1958 08/08/23 2130 08/09/23 0352 08/09/23 0803  BP: (!) 156/84  134/76 123/78  Pulse: 100  (!) 109 (!) 108  Resp: 18  18 16   Temp: (!) 100.7 F (38.2 C) 99 F (37.2 C) 98.5 F (36.9 C) 98.6 F (37 C)  TempSrc:      SpO2: 97%  97% 95%  Weight:      Height:       No intake or output data in the 24 hours ending 08/09/23 1414  Filed Weights   08/05/23 0943  Weight: 65.8 kg    Examination:  General exam: Appears calm and comfortable  Respiratory system: Clear to auscultation. Respiratory effort normal. Cardiovascular system: S1-S2 2, tachycardic, regular rhythm, no murmurs Gastrointestinal system: Soft, NT/ND  Central nervous system: Alert and oriented to self and city. Calm. Moving all 4. Distal sensation intact Extremities: warm, no edema Skin: No visible rashes, lesions or ulcers Psychiatry: pleasantly confused    Data Reviewed: I have personally reviewed following labs and imaging studies  CBC: Recent Labs  Lab 08/05/23 0950 08/06/23 0507 08/09/23 0905  WBC 13.4* 8.6 8.1  HGB 14.4 11.0* 11.5*  HCT 43.4 35.7* 34.0*  MCV 87.0 93.0 84.8  PLT 133* 158 215   Basic Metabolic Panel: Recent Labs  Lab 08/05/23 0950 08/06/23 0507 08/08/23 0645 08/09/23 0905  NA 138 141 139 139  K 4.1 3.9 3.4* 3.6  CL 101 107 105 104  CO2 22 20* 22 25  GLUCOSE 217* 100* 122* 137*   BUN 23 26* 20 20  CREATININE 1.08* 1.11* 0.93 0.88  CALCIUM 10.3 9.4 9.8 10.1  MG  --   --   --  1.7  PHOS  --   --   --  3.4   GFR: Estimated Creatinine Clearance: 46.9 mL/min (by C-G formula based on SCr of 0.88 mg/dL). Liver Function Tests: Recent Labs  Lab 08/05/23 0950  AST 37  ALT 44  ALKPHOS 81  BILITOT 1.0  PROT 7.6  ALBUMIN 4.0   No results for input(s): "LIPASE", "AMYLASE" in the last 168 hours. No results for input(s): "AMMONIA" in the last 168 hours. Coagulation Profile: No results for input(s): "INR", "PROTIME" in the last 168 hours. Cardiac Enzymes: No results for input(s): "CKTOTAL", "CKMB", "CKMBINDEX", "TROPONINI" in the last 168 hours. BNP (last 3 results) No results for input(s): "PROBNP" in the last 8760 hours. HbA1C: No results for input(s): "HGBA1C" in the last 72 hours. CBG: Recent Labs  Lab 08/08/23 0755 08/08/23 1126 08/08/23 1619 08/08/23 2152 08/09/23 0832  GLUCAP 116* 195* 189* 163* 143*   Lipid Profile: No results for input(s): "CHOL", "HDL", "LDLCALC", "TRIG", "CHOLHDL", "LDLDIRECT" in the last 72 hours. Thyroid Function Tests: No results for input(s): "TSH", "T4TOTAL", "FREET4", "T3FREE", "THYROIDAB" in the last 72 hours. Anemia Panel: No results for input(s): "VITAMINB12", "FOLATE", "FERRITIN", "TIBC", "IRON", "RETICCTPCT" in the last 72 hours. Sepsis Labs: Recent Labs  Lab 08/05/23 1146  LATICACIDVEN 1.4    Recent Results (from the past 240 hour(s))  Blood culture (routine x 2)     Status: None (Preliminary result)   Collection Time: 08/05/23 11:46 AM   Specimen: BLOOD RIGHT ARM  Result Value Ref Range Status   Specimen Description BLOOD RIGHT ARM  Final   Special Requests   Final    BOTTLES DRAWN AEROBIC AND ANAEROBIC Blood Culture results may not be optimal due to an inadequate volume of blood received in culture bottles   Culture   Final    NO GROWTH 4 DAYS Performed at [facility], [address]  [place], [place] [address]    Report Status PENDING  Incomplete  Blood culture (routine x 2)     Status: None (Preliminary result)   Collection Time: 08/05/23 11:46 AM   Specimen: BLOOD LEFT HAND  Result Value Ref Range Status   Specimen Description BLOOD LEFT HAND  Final   Special Requests   Final    BOTTLES DRAWN AEROBIC AND ANAEROBIC Blood Culture results may not be optimal due to an inadequate volume of blood received in culture bottles   Culture   Final    NO GROWTH 4 DAYS Performed at [facility], [address] [place], [place] [address]    Report Status PENDING  Incomplete  Resp panel by RT-PCR (RSV, Flu A&B, Covid) Anterior Nasal Swab     Status: None   Collection Time: 08/05/23 11:47 AM   Specimen: Anterior Nasal Swab  Result Value Ref Range Status   SARS Coronavirus  2 by RT PCR NEGATIVE NEGATIVE Final    Comment: (NOTE) SARS-CoV-2 target nucleic acids are NOT DETECTED.  The SARS-CoV-2 RNA is generally detectable in upper respiratory specimens during the acute phase of infection. The lowest concentration of SARS-CoV-2 viral copies this assay can detect is 138 copies/mL. A negative result does not preclude SARS-Cov-2 infection and should not be used as the sole basis for treatment or other patient management decisions. A negative result may occur with  improper specimen collection/handling, submission of specimen other than nasopharyngeal swab, presence of viral mutation(s) within the areas targeted by this assay, and inadequate number of viral copies(<138 copies/mL). A negative result must be combined with clinical observations, patient history, and epidemiological information. The expected result is Negative.  Fact Sheet for Patients:  BloggerCourse.com  Fact Sheet for Healthcare Providers:  SeriousBroker.it  This test is no t yet approved or cleared by the [place] FDA and  has been authorized for  detection and/or diagnosis of SARS-CoV-2 by FDA under an Emergency Use Authorization (EUA). This EUA will remain  in effect (meaning this test can be used) for the duration of the COVID-19 declaration under Section 564(b)(1) of the Act, 21 U.S.C.section 360bbb-3(b)(1), unless the authorization is terminated  or revoked sooner.       Influenza A by PCR NEGATIVE NEGATIVE Final   Influenza B by PCR NEGATIVE NEGATIVE Final    Comment: (NOTE) The Xpert Xpress SARS-CoV-2/FLU/RSV plus assay is intended as an aid in the diagnosis of influenza from Nasopharyngeal swab specimens and should not be used as a sole basis for treatment. Nasal washings and aspirates are unacceptable for Xpert Xpress SARS-CoV-2/FLU/RSV testing.  Fact Sheet for Patients: BloggerCourse.com  Fact Sheet for Healthcare Providers: SeriousBroker.it  This test is not yet approved or cleared by the [place] FDA and has been authorized for detection and/or diagnosis of SARS-CoV-2 by FDA under an Emergency Use Authorization (EUA). This EUA will remain in effect (meaning this test can be used) for the duration of the COVID-19 declaration under Section 564(b)(1) of the Act, 21 U.S.C. section 360bbb-3(b)(1), unless the authorization is terminated or revoked.     Resp Syncytial Virus by PCR NEGATIVE NEGATIVE Final    Comment: (NOTE) Fact Sheet for Patients: BloggerCourse.com  Fact Sheet for Healthcare Providers: SeriousBroker.it  This test is not yet approved or cleared by the [place] FDA and has been authorized for detection and/or diagnosis of SARS-CoV-2 by FDA under an Emergency Use Authorization (EUA). This EUA will remain in effect (meaning this test can be used) for the duration of the COVID-19 declaration under Section 564(b)(1) of the Act, 21 U.S.C. section 360bbb-3(b)(1), unless the authorization is  terminated or revoked.  Performed at [facility], [address] [place], [place] [address]          Radiology Studies: MR LUMBAR SPINE WO CONTRAST  Result Date: 08/08/2023 CLINICAL DATA:  72 year old female with weakness and urinary retention. Fall. EXAM: MRI LUMBAR SPINE WITHOUT CONTRAST TECHNIQUE: Multiplanar, multisequence MR imaging of the lumbar spine was performed. No intravenous contrast was administered. COMPARISON:  Lumbar spine CT 08/16/2022. FINDINGS: Segmentation: Normal with hypoplastic ribs at T12 on the comparison. Alignment: Mildly exaggerated lumbar lordosis is stable from last year. No significant scoliosis or spondylolisthesis. Vertebrae: Chronic moderate to severe L1 and L3 compression fractures appears stable and configuration from last year, and there is No marrow edema or evidence of acute osseous abnormality. Normal background bone marrow signal. Maintained other lumbar vertebral  height. Anterior T11 benign vertebral hemangioma partially visible (series 7, image 9). Intact visible sacrum and SI joints. Conus medullaris and cauda equina: Conus extends to the T12-L1 level. No lower spinal cord or conus signal abnormality. Motion degraded axial images despite repeated imaging attempts. Cauda equina nerve roots appear grossly normal. Paraspinal and other soft tissues: Motion degraded axial images despite repeated imaging attempts. Grossly negative visualized abdominal viscera and paraspinal soft tissues. Disc levels: Motion degraded axial imaging limits detail, despite repeated axial imaging attempts. Still, there is no significant lumbar spinal stenosis identified despite multilevel disc degeneration, disc bulging, facet and ligament flavum hypertrophy. However, there is degenerative lumbar neural foraminal stenosis as follows: L1-L2: Moderate to severe left L1 neural foraminal stenosis appears primarily related to broad-based foraminal disc. L2-L3: Moderate to  severe bilateral L2 neural foraminal stenosis appears related to bulky foraminal and/or far lateral disc (e.g. Series 5, image 3 on the right). L3-L4: Mild to moderate left and moderate to severe right L3 foraminal stenosis also appears primarily related to foraminal disc (series 5, image 3 on the right). L4-L5: Moderate to severe left and moderate right L4 foraminal stenosis with similar foraminal disc. IMPRESSION: 1. No acute osseous abnormality. Chronic L1 and L3 compression fractures. 2. Motion degraded axial lumbar images despite repeated imaging attempts. No significant lumbar spinal stenosis identified. However, multilevel moderate to severe neural foraminal stenosis which appears primarily related to foraminal disc herniations: Left L1, bilateral L2, right greater than left L3, left greater than right L4 nerve levels. Electronically Signed   By: Odessa Fleming M.D.   On: 08/08/2023 04:30        Scheduled Meds:  allopurinol  100 mg Oral BID   carvedilol  6.25 mg Oral BID WC   cyanocobalamin  1,000 mcg Oral Daily   donepezil  10 mg Oral Daily   feeding supplement  237 mL Oral BID BM   icosapent Ethyl  2 g Oral BID   insulin aspart  0-15 Units Subcutaneous TID WC   insulin aspart  0-5 Units Subcutaneous QHS   losartan  25 mg Oral Daily   memantine  10 mg Oral BID   multivitamin with minerals  1 tablet Oral Q lunch   QUEtiapine  100 mg Oral TID   rosuvastatin  20 mg Oral Daily   sertraline  50 mg Oral Daily   Continuous Infusions:     LOS: 3 days    Gillis Santa, MD [facility]   If 7PM-7AM, please contact night-coverage  08/09/2023, 2:14 PM

## 2023-08-10 DIAGNOSIS — B338 Other specified viral diseases: Secondary | ICD-10-CM | POA: Diagnosis not present

## 2023-08-10 DIAGNOSIS — W19XXXD Unspecified fall, subsequent encounter: Secondary | ICD-10-CM | POA: Diagnosis not present

## 2023-08-10 LAB — CBC
HCT: 34.4 % — ABNORMAL LOW (ref 36.0–46.0)
Hemoglobin: 11.3 g/dL — ABNORMAL LOW (ref 12.0–15.0)
MCH: 28.3 pg (ref 26.0–34.0)
MCHC: 32.8 g/dL (ref 30.0–36.0)
MCV: 86 fL (ref 80.0–100.0)
Platelets: 232 10*3/uL (ref 150–400)
RBC: 4 MIL/uL (ref 3.87–5.11)
RDW: 13.2 % (ref 11.5–15.5)
WBC: 8.9 10*3/uL (ref 4.0–10.5)
nRBC: 0 % (ref 0.0–0.2)

## 2023-08-10 LAB — CULTURE, BLOOD (ROUTINE X 2)
Culture: NO GROWTH
Culture: NO GROWTH

## 2023-08-10 LAB — BASIC METABOLIC PANEL
Anion gap: 14 (ref 5–15)
BUN: 21 mg/dL (ref 8–23)
CO2: 24 mmol/L (ref 22–32)
Calcium: 10.2 mg/dL (ref 8.9–10.3)
Chloride: 103 mmol/L (ref 98–111)
Creatinine, Ser: 0.95 mg/dL (ref 0.44–1.00)
GFR, Estimated: >60 mL/min (ref >60)
Glucose, Bld: 140 mg/dL — ABNORMAL HIGH (ref 70–99)
Potassium: 3.7 mmol/L (ref 3.5–5.1)
Sodium: 141 mmol/L (ref 135–145)

## 2023-08-10 LAB — GLUCOSE, CAPILLARY
Glucose-Capillary: 122 mg/dL — ABNORMAL HIGH (ref 70–99)
Glucose-Capillary: 222 mg/dL — ABNORMAL HIGH (ref 70–99)
Glucose-Capillary: 176 mg/dL — ABNORMAL HIGH (ref 70–99)
Glucose-Capillary: 218 mg/dL — ABNORMAL HIGH (ref 70–99)

## 2023-08-10 LAB — PHOSPHORUS: Phosphorus: 3.5 mg/dL (ref 2.5–4.6)

## 2023-08-10 LAB — TSH: TSH: 1.943 u[IU]/mL (ref 0.350–4.500)

## 2023-08-10 LAB — MAGNESIUM: Magnesium: 1.7 mg/dL (ref 1.7–2.4)

## 2023-08-10 NOTE — Plan of Care (Signed)

## 2023-08-10 NOTE — Progress Notes (Signed)
   08/09/23 2012  Vitals  Temp 98.4 F (36.9 C)  BP (!) 154/85  MAP (mmHg) 105  BP Location Left Arm  BP Method Automatic  Patient Position (if appropriate) Lying  Pulse Rate (!) 116  Pulse Rate Source Monitor  Resp 18  MEWS COLOR  MEWS Score Color Yellow  Oxygen Therapy  SpO2 95 %  O2 Device Room Air  Pain Assessment  Pain Scale 0-10  Pain Score 0  PAINAD (Pain Assessment in Advanced Dementia)  Breathing 0  Negative Vocalization 0  Facial Expression 0  Body Language 0  Consolability 0  PAINAD Score 0  PCA/Epidural/Spinal Assessment  Respiratory Pattern Regular  MEWS Score  MEWS Temp 0  MEWS Systolic 0  MEWS Pulse 2  MEWS RR 0  MEWS LOC 0  MEWS Score 2  Provider Notification  Provider Name/Title brenda morrison NP  Date Provider Notified 08/09/23  Time Provider Notified 2020  Method of Notification Call  Notification Reason Other (Comment) (yellow mews)  Provider response No new orders  Date of Provider Response 08/09/23  Time of Provider Response 2019 (2019)

## 2023-08-10 NOTE — Progress Notes (Signed)
PROGRESS NOTE    Lindsey [name]  AVW:[id number] DOB: 1950/11/19 DOA: 08/05/2023 PCP: Alba Cory, MD    Brief Narrative:   72 y.o. female with medical history significant of dementia with behavioral disturbance, type 2 diabetes, hypertension, hyperlipidemia, depression, osteopenia who lives alone who was brought in for weakness and falls.   Patient unable to provide accurate history.  History obtained by speaking with the ER physician and reviewing the medical record.  Patient lives independently and does all of her activities of daily living with family support close by.  Family noted her to be increasingly weak and confused and patient had a witnessed fall.  Unclear if this was syncopal event or not.  Patient was brought to the ED for further evaluation.   Assessment & Plan:   Principal Problem:   Fall Active Problems:   Benign essential HTN   Dementia with behavioral disturbance (HCC)   Type 2 diabetes mellitus with microalbuminuria, without long-term current use of insulin (HCC)  Ambulatory dysfunction Family reports trouble getting out of bed, slid out of chair at home as well. Normally able to ambulate without assistance but does have a walker. Mri brain negative. MRI lumbar spine no signs canal stenosis or cord compression. PT advising snf - TOC consulted for SNF  Acute urinary retention On 10/23, required I/o cath x1, today urinating spontaneously, lumbar mri as above negative, PVRs neg - monitor  Chronic L1 and L3 compression fractures On MRI this admission - pain control, PT    Dementia with behavioral disturbance, Alzheimer's type With fluctuating delirium as well Continue sertraline 50 mg daily Continue donepezil 10 mg daily Continue memantine 10 mg daily   Hyperlipidemia PTA statin   Essential hypertension controlled PTA Coreg PT Cozaar   History of gout PTA allopurinol   B12 deficiency B12 tablets   DVT prophylaxis: Lovenox Code Status:  Full Family Communication: son updated telephonically 10/23 Disposition Plan: inpt   TOC following, working on SNF placement.   Level of care: Telemetry Medical  Consultants:  None  Procedures:  None  Antimicrobials: None   Subjective: No significant overnight events, patient was lying comfortably, patient was sleepy she woke up and told her name, does not remember her birthday, she does stop again.  Did not offer any complaints.  Objective: Vitals:   08/09/23 2012 08/10/23 0008 08/10/23 0808 08/10/23 0809  BP: (!) 154/85 137/87 118/71 118/71  Pulse: (!) 116 (!) 116 (!) 107 (!) 110  Resp: 18  16 16   Temp: 98.4 F (36.9 C) 99.5 F (37.5 C) 99.1 F (37.3 C) 99.1 F (37.3 C)  TempSrc:  Oral    SpO2: 95% 97% 97% 96%  Weight:      Height:        Intake/Output Summary (Last 24 hours) at 08/10/2023 1343 Last data filed at 08/10/2023 1240 Gross per 24 hour  Intake 480 ml  Output --  Net 480 ml    Filed Weights   08/05/23 0943  Weight: 65.8 kg    Examination:  General exam: Appears calm and comfortable  Respiratory system: Clear to auscultation. Respiratory effort normal. Cardiovascular system: S1-S2 2, tachycardic, regular rhythm, no murmurs Gastrointestinal system: Soft, NT/ND  Central nervous system: Alert and oriented to self and city. Calm. Moving all 4. Distal sensation intact Extremities: warm, no edema Skin: No visible rashes, lesions or ulcers Psychiatry: pleasantly confused    Data Reviewed: I have personally reviewed following labs and imaging studies  CBC: Recent Labs  Lab 08/05/23 0950 08/06/23 0507 08/09/23 0905 08/10/23 0529  WBC 13.4* 8.6 8.1 8.9  HGB 14.4 11.0* 11.5* 11.3*  HCT 43.4 35.7* 34.0* 34.4*  MCV 87.0 93.0 84.8 86.0  PLT 133* 158 215 232   Basic Metabolic Panel: Recent Labs  Lab 08/05/23 0950 08/06/23 0507 08/08/23 0645 08/09/23 0905 08/10/23 0529  NA 138 141 139 139 141  K 4.1 3.9 3.4* 3.6 3.7  CL 101 107 105  104 103  CO2 22 20* 22 25 24   GLUCOSE 217* 100* 122* 137* 140*  BUN 23 26* 20 20 21   CREATININE 1.08* 1.11* 0.93 0.88 0.95  CALCIUM 10.3 9.4 9.8 10.1 10.2  MG  --   --   --  1.7 1.7  PHOS  --   --   --  3.4 3.5   GFR: Estimated Creatinine Clearance: 43.5 mL/min (by C-G formula based on SCr of 0.95 mg/dL). Liver Function Tests: Recent Labs  Lab 08/05/23 0950  AST 37  ALT 44  ALKPHOS 81  BILITOT 1.0  PROT 7.6  ALBUMIN 4.0   No results for input(s): "LIPASE", "AMYLASE" in the last 168 hours. No results for input(s): "AMMONIA" in the last 168 hours. Coagulation Profile: No results for input(s): "INR", "PROTIME" in the last 168 hours. Cardiac Enzymes: No results for input(s): "CKTOTAL", "CKMB", "CKMBINDEX", "TROPONINI" in the last 168 hours. BNP (last 3 results) No results for input(s): "PROBNP" in the last 8760 hours. HbA1C: No results for input(s): "HGBA1C" in the last 72 hours. CBG: Recent Labs  Lab 08/09/23 0832 08/09/23 1609 08/09/23 2114 08/10/23 0841 08/10/23 1303  GLUCAP 143* 122* 144* 122* 222*   Lipid Profile: No results for input(s): "CHOL", "HDL", "LDLCALC", "TRIG", "CHOLHDL", "LDLDIRECT" in the last 72 hours. Thyroid Function Tests: Recent Labs    08/10/23 0529  TSH 1.943   Anemia Panel: No results for input(s): "VITAMINB12", "FOLATE", "FERRITIN", "TIBC", "IRON", "RETICCTPCT" in the last 72 hours. Sepsis Labs: Recent Labs  Lab 08/05/23 1146  LATICACIDVEN 1.4    Recent Results (from the past 240 hour(s))  Blood culture (routine x 2)     Status: None   Collection Time: 08/05/23 11:46 AM   Specimen: BLOOD RIGHT ARM  Result Value Ref Range Status   Specimen Description BLOOD RIGHT ARM  Final   Special Requests   Final    BOTTLES DRAWN AEROBIC AND ANAEROBIC Blood Culture results may not be optimal due to an inadequate volume of blood received in culture bottles   Culture   Final    NO GROWTH 5 DAYS Performed at [facility], [address] [place], [place] [address]    Report Status 08/10/2023 FINAL  Final  Blood culture (routine x 2)     Status: None   Collection Time: 08/05/23 11:46 AM   Specimen: BLOOD LEFT HAND  Result Value Ref Range Status   Specimen Description BLOOD LEFT HAND  Final   Special Requests   Final    BOTTLES DRAWN AEROBIC AND ANAEROBIC Blood Culture results may not be optimal due to an inadequate volume of blood received in culture bottles   Culture   Final    NO GROWTH 5 DAYS Performed at [facility], [address] [place], [place] [address]    Report Status 08/10/2023 FINAL  Final  Resp panel by RT-PCR (RSV, Flu A&B, Covid) Anterior Nasal Swab     Status: None   Collection Time: 08/05/23 11:47 AM   Specimen: Anterior Nasal  Swab  Result Value Ref Range Status   SARS Coronavirus 2 by RT PCR NEGATIVE NEGATIVE Final    Comment: (NOTE) SARS-CoV-2 target nucleic acids are NOT DETECTED.  The SARS-CoV-2 RNA is generally detectable in upper respiratory specimens during the acute phase of infection. The lowest concentration of SARS-CoV-2 viral copies this assay can detect is 138 copies/mL. A negative result does not preclude SARS-Cov-2 infection and should not be used as the sole basis for treatment or other patient management decisions. A negative result may occur with  improper specimen collection/handling, submission of specimen other than nasopharyngeal swab, presence of viral mutation(s) within the areas targeted by this assay, and inadequate number of viral copies(<138 copies/mL). A negative result must be combined with clinical observations, patient history, and epidemiological information. The expected result is Negative.  Fact Sheet for Patients:  BloggerCourse.com  Fact Sheet for Healthcare Providers:  SeriousBroker.it  This test is no t yet approved or cleared by the [place] FDA and  has been authorized for  detection and/or diagnosis of SARS-CoV-2 by FDA under an Emergency Use Authorization (EUA). This EUA will remain  in effect (meaning this test can be used) for the duration of the COVID-19 declaration under Section 564(b)(1) of the Act, 21 U.S.C.section 360bbb-3(b)(1), unless the authorization is terminated  or revoked sooner.       Influenza A by PCR NEGATIVE NEGATIVE Final   Influenza B by PCR NEGATIVE NEGATIVE Final    Comment: (NOTE) The Xpert Xpress SARS-CoV-2/FLU/RSV plus assay is intended as an aid in the diagnosis of influenza from Nasopharyngeal swab specimens and should not be used as a sole basis for treatment. Nasal washings and aspirates are unacceptable for Xpert Xpress SARS-CoV-2/FLU/RSV testing.  Fact Sheet for Patients: BloggerCourse.com  Fact Sheet for Healthcare Providers: SeriousBroker.it  This test is not yet approved or cleared by the [place] FDA and has been authorized for detection and/or diagnosis of SARS-CoV-2 by FDA under an Emergency Use Authorization (EUA). This EUA will remain in effect (meaning this test can be used) for the duration of the COVID-19 declaration under Section 564(b)(1) of the Act, 21 U.S.C. section 360bbb-3(b)(1), unless the authorization is terminated or revoked.     Resp Syncytial Virus by PCR NEGATIVE NEGATIVE Final    Comment: (NOTE) Fact Sheet for Patients: BloggerCourse.com  Fact Sheet for Healthcare Providers: SeriousBroker.it  This test is not yet approved or cleared by the [place] FDA and has been authorized for detection and/or diagnosis of SARS-CoV-2 by FDA under an Emergency Use Authorization (EUA). This EUA will remain in effect (meaning this test can be used) for the duration of the COVID-19 declaration under Section 564(b)(1) of the Act, 21 U.S.C. section 360bbb-3(b)(1), unless the authorization is  terminated or revoked.  Performed at [facility], [address], [place], [place] [address]          Radiology Studies: No results found.      Scheduled Meds:  allopurinol  100 mg Oral BID   carvedilol  6.25 mg Oral BID WC   cyanocobalamin  1,000 mcg Oral Daily   donepezil  10 mg Oral Daily   feeding supplement  237 mL Oral BID BM   icosapent Ethyl  2 g Oral BID   insulin aspart  0-15 Units Subcutaneous TID WC   insulin aspart  0-5 Units Subcutaneous QHS   losartan  25 mg Oral Daily   memantine  10 mg Oral BID   multivitamin with minerals  1 tablet Oral Q lunch   QUEtiapine  100 mg Oral TID   rosuvastatin  20 mg Oral Daily   sertraline  50 mg Oral Daily   Continuous Infusions:     LOS: 4 days    Gillis Santa, MD [facility]   If 7PM-7AM, please contact night-coverage  08/10/2023, 1:43 PM

## 2023-08-11 DIAGNOSIS — B338 Other specified viral diseases: Secondary | ICD-10-CM | POA: Diagnosis not present

## 2023-08-11 DIAGNOSIS — W19XXXD Unspecified fall, subsequent encounter: Secondary | ICD-10-CM | POA: Diagnosis not present

## 2023-08-11 LAB — CBC
HCT: 33.3 % — ABNORMAL LOW (ref 36.0–46.0)
Hemoglobin: 11.1 g/dL — ABNORMAL LOW (ref 12.0–15.0)
MCH: 28.8 pg (ref 26.0–34.0)
MCHC: 33.3 g/dL (ref 30.0–36.0)
MCV: 86.3 fL (ref 80.0–100.0)
Platelets: 242 10*3/uL (ref 150–400)
RBC: 3.86 MIL/uL — ABNORMAL LOW (ref 3.87–5.11)
RDW: 13.3 % (ref 11.5–15.5)
WBC: 9 10*3/uL (ref 4.0–10.5)
nRBC: 0 % (ref 0.0–0.2)

## 2023-08-11 LAB — BASIC METABOLIC PANEL
Anion gap: 12 (ref 5–15)
BUN: 27 mg/dL — ABNORMAL HIGH (ref 8–23)
CO2: 24 mmol/L (ref 22–32)
Calcium: 10 mg/dL (ref 8.9–10.3)
Chloride: 102 mmol/L (ref 98–111)
Creatinine, Ser: 0.93 mg/dL (ref 0.44–1.00)
GFR, Estimated: >60 mL/min (ref >60)
Glucose, Bld: 179 mg/dL — ABNORMAL HIGH (ref 70–99)
Potassium: 3.6 mmol/L (ref 3.5–5.1)
Sodium: 138 mmol/L (ref 135–145)

## 2023-08-11 LAB — GLUCOSE, CAPILLARY
Glucose-Capillary: 159 mg/dL — ABNORMAL HIGH (ref 70–99)
Glucose-Capillary: 256 mg/dL — ABNORMAL HIGH (ref 70–99)
Glucose-Capillary: 182 mg/dL — ABNORMAL HIGH (ref 70–99)
Glucose-Capillary: 207 mg/dL — ABNORMAL HIGH (ref 70–99)

## 2023-08-11 LAB — PHOSPHORUS: Phosphorus: 2.8 mg/dL (ref 2.5–4.6)

## 2023-08-11 LAB — T4, FREE: Free T4: 1.08 ng/dL (ref 0.61–1.12)

## 2023-08-11 LAB — MAGNESIUM: Magnesium: 1.7 mg/dL (ref 1.7–2.4)

## 2023-08-11 NOTE — Plan of Care (Signed)
  Problem: Education: Goal: Ability to describe self-care measures that may prevent or decrease complications (Diabetes Survival Skills Education) will improve Outcome: Progressing Goal: Individualized Educational Video(s) Outcome: Progressing   Problem: Coping: Goal: Ability to adjust to condition or change in health will improve Outcome: Progressing   Problem: Fluid Volume: Goal: Ability to maintain a balanced intake and output will improve Outcome: Progressing   Problem: Health Behavior/Discharge Planning: Goal: Ability to identify and utilize available resources and services will improve Outcome: Progressing Goal: Ability to manage health-related needs will improve Outcome: Progressing   Problem: Metabolic: Goal: Ability to maintain appropriate glucose levels will improve Outcome: Progressing   Problem: Nutritional: Goal: Progress toward achieving an optimal weight will improve Outcome: Progressing   Problem: Skin Integrity: Goal: Risk for impaired skin integrity will decrease Outcome: Progressing   Problem: Tissue Perfusion: Goal: Adequacy of tissue perfusion will improve Outcome: Progressing   Problem: Education: Goal: Knowledge of General Education information will improve Description: Including pain rating scale, medication(s)/side effects and non-pharmacologic comfort measures Outcome: Progressing   Problem: Health Behavior/Discharge Planning: Goal: Ability to manage health-related needs will improve Outcome: Progressing   Problem: Clinical Measurements: Goal: Ability to maintain clinical measurements within normal limits will improve Outcome: Progressing Goal: Will remain free from infection Outcome: Progressing Goal: Diagnostic test results will improve Outcome: Progressing Goal: Respiratory complications will improve Outcome: Progressing Goal: Cardiovascular complication will be avoided Outcome: Progressing   Problem: Activity: Goal: Risk for  activity intolerance will decrease Outcome: Progressing   Problem: Nutrition: Goal: Adequate nutrition will be maintained Outcome: Progressing   Problem: Coping: Goal: Level of anxiety will decrease Outcome: Progressing   Problem: Elimination: Goal: Will not experience complications related to bowel motility Outcome: Progressing Goal: Will not experience complications related to urinary retention Outcome: Progressing   Problem: Pain Management: Goal: General experience of comfort will improve Outcome: Progressing   Problem: Safety: Goal: Ability to remain free from injury will improve Outcome: Progressing   Problem: Skin Integrity: Goal: Risk for impaired skin integrity will decrease Outcome: Progressing   Problem: Nutritional: Goal: Maintenance of adequate nutrition will improve Outcome: Not Progressing  Pt with poor PO intake this shift even offering to feed pt and set pt's food trays up including cutting sandwich up into finger food size in order for the pt to pick up and feed herself.  Continue to offer encouragement to increase PO intake

## 2023-08-11 NOTE — Progress Notes (Signed)
PROGRESS NOTE    Lindsey [name]  MVH:[id number] DOB: Sep 01, 1951 DOA: 08/05/2023 PCP: Alba Cory, MD    Brief Narrative:   72 y.o. female with medical history significant of dementia with behavioral disturbance, type 2 diabetes, hypertension, hyperlipidemia, depression, osteopenia who lives alone who was brought in for weakness and falls.   Patient unable to provide accurate history.  History obtained by speaking with the ER physician and reviewing the medical record.  Patient lives independently and does all of her activities of daily living with family support close by.  Family noted her to be increasingly weak and confused and patient had a witnessed fall.  Unclear if this was syncopal event or not.  Patient was brought to the ED for further evaluation.   Assessment & Plan:   Principal Problem:   Fall Active Problems:   Benign essential HTN   Dementia with behavioral disturbance (HCC)   Type 2 diabetes mellitus with microalbuminuria, without long-term current use of insulin (HCC)  Ambulatory dysfunction Family reports trouble getting out of bed, slid out of chair at home as well. Normally able to ambulate without assistance but does have a walker. Mri brain negative. MRI lumbar spine no signs canal stenosis or cord compression. PT advising snf - TOC consulted for SNF  Acute urinary retention On 10/23, required I/o cath x1, today urinating spontaneously, lumbar mri as above negative, PVRs neg - monitor  Chronic L1 and L3 compression fractures On MRI this admission - pain control, PT    Dementia with behavioral disturbance, Alzheimer's type AAOx 1 at baseline, With fluctuating delirium as well Continue sertraline 50 mg daily Continue donepezil 10 mg daily Continue memantine 10 mg daily   Hyperlipidemia PTA statin   Essential hypertension controlled PTA Coreg PT Cozaar   History of gout PTA allopurinol   B12 deficiency B12 tablets   DVT prophylaxis:  Lovenox Code Status: Full Family Communication: son updated telephonically 10/23 Disposition Plan: inpt   TOC following, working on SNF placement.   Level of care: Telemetry Medical  Consultants:  None  Procedures:  None  Antimicrobials: None   Subjective: No significant overnight events, patient would like, really anything request with the help of her daughter at bedside.  Elderly complaints. Discussed with patient's daughter at bedside.   Objective: Vitals:   08/10/23 0808 08/10/23 0809 08/10/23 1952 08/11/23 1139  BP: 118/71 118/71 134/64 (!) 150/69  Pulse: (!) 107 (!) 110 (!) 109 (!) 108  Resp: 16 16 16 19   Temp: 99.1 F (37.3 C) 99.1 F (37.3 C) 98.3 F (36.8 C) 98.3 F (36.8 C)  TempSrc:      SpO2: 97% 96% 92% 96%  Weight:      Height:        Intake/Output Summary (Last 24 hours) at 08/11/2023 1306 Last data filed at 08/11/2023 1058 Gross per 24 hour  Intake --  Output 1300 ml  Net -1300 ml    Filed Weights   08/05/23 0943  Weight: 65.8 kg    Examination:  General exam: Appears calm and comfortable  Respiratory system: Clear to auscultation. Respiratory effort normal. Cardiovascular system: S1-S2 2, tachycardic, regular rhythm, no murmurs Gastrointestinal system: Soft, NT/ND  Central nervous system: Alert and oriented to self and city. Calm. Moving all 4. Distal sensation intact Extremities: warm, no edema Skin: No visible rashes, lesions or ulcers Psychiatry: pleasantly confused    Data Reviewed: I have personally reviewed following labs and imaging studies  CBC: Recent Labs  Lab 08/05/23 0950 08/06/23 0507 08/09/23 0905 08/10/23 0529 08/11/23 0605  WBC 13.4* 8.6 8.1 8.9 9.0  HGB 14.4 11.0* 11.5* 11.3* 11.1*  HCT 43.4 35.7* 34.0* 34.4* 33.3*  MCV 87.0 93.0 84.8 86.0 86.3  PLT 133* 158 215 232 242   Basic Metabolic Panel: Recent Labs  Lab 08/06/23 0507 08/08/23 0645 08/09/23 0905 08/10/23 0529 08/11/23 0605  NA 141 139  139 141 138  K 3.9 3.4* 3.6 3.7 3.6  CL 107 105 104 103 102  CO2 20* 22 25 24 24   GLUCOSE 100* 122* 137* 140* 179*  BUN 26* 20 20 21  27*  CREATININE 1.11* 0.93 0.88 0.95 0.93  CALCIUM 9.4 9.8 10.1 10.2 10.0  MG  --   --  1.7 1.7 1.7  PHOS  --   --  3.4 3.5 2.8   GFR: Estimated Creatinine Clearance: 44.4 mL/min (by C-G formula based on SCr of 0.93 mg/dL). Liver Function Tests: Recent Labs  Lab 08/05/23 0950  AST 37  ALT 44  ALKPHOS 81  BILITOT 1.0  PROT 7.6  ALBUMIN 4.0   No results for input(s): "LIPASE", "AMYLASE" in the last 168 hours. No results for input(s): "AMMONIA" in the last 168 hours. Coagulation Profile: No results for input(s): "INR", "PROTIME" in the last 168 hours. Cardiac Enzymes: No results for input(s): "CKTOTAL", "CKMB", "CKMBINDEX", "TROPONINI" in the last 168 hours. BNP (last 3 results) No results for input(s): "PROBNP" in the last 8760 hours. HbA1C: No results for input(s): "HGBA1C" in the last 72 hours. CBG: Recent Labs  Lab 08/10/23 1303 08/10/23 1643 08/10/23 2054 08/11/23 0738 08/11/23 1143  GLUCAP 222* 176* 218* 159* 256*   Lipid Profile: No results for input(s): "CHOL", "HDL", "LDLCALC", "TRIG", "CHOLHDL", "LDLDIRECT" in the last 72 hours. Thyroid Function Tests: Recent Labs    08/10/23 0529 08/11/23 0605  TSH 1.943  --   FREET4  --  1.08   Anemia Panel: No results for input(s): "VITAMINB12", "FOLATE", "FERRITIN", "TIBC", "IRON", "RETICCTPCT" in the last 72 hours. Sepsis Labs: Recent Labs  Lab 08/05/23 1146  LATICACIDVEN 1.4    Recent Results (from the past 240 hour(s))  Blood culture (routine x 2)     Status: None   Collection Time: 08/05/23 11:46 AM   Specimen: BLOOD RIGHT ARM  Result Value Ref Range Status   Specimen Description BLOOD RIGHT ARM  Final   Special Requests   Final    BOTTLES DRAWN AEROBIC AND ANAEROBIC Blood Culture results may not be optimal due to an inadequate volume of blood received in culture  bottles   Culture   Final    NO GROWTH 5 DAYS Performed at [facility] [place] [facility], [address] [place], [place] [address]    Report Status 08/10/2023 FINAL  Final  Blood culture (routine x 2)     Status: None   Collection Time: 08/05/23 11:46 AM   Specimen: BLOOD LEFT HAND  Result Value Ref Range Status   Specimen Description BLOOD LEFT HAND  Final   Special Requests   Final    BOTTLES DRAWN AEROBIC AND ANAEROBIC Blood Culture results may not be optimal due to an inadequate volume of blood received in culture bottles   Culture   Final    NO GROWTH 5 DAYS Performed at [facility], [address] [place], [place] [address]    Report Status 08/10/2023 FINAL  Final  Resp panel by RT-PCR (RSV, Flu A&B, Covid) Anterior Nasal Swab  Status: None   Collection Time: 08/05/23 11:47 AM   Specimen: Anterior Nasal Swab  Result Value Ref Range Status   SARS Coronavirus 2 by RT PCR NEGATIVE NEGATIVE Final    Comment: (NOTE) SARS-CoV-2 target nucleic acids are NOT DETECTED.  The SARS-CoV-2 RNA is generally detectable in upper respiratory specimens during the acute phase of infection. The lowest concentration of SARS-CoV-2 viral copies this assay can detect is 138 copies/mL. A negative result does not preclude SARS-Cov-2 infection and should not be used as the sole basis for treatment or other patient management decisions. A negative result may occur with  improper specimen collection/handling, submission of specimen other than nasopharyngeal swab, presence of viral mutation(s) within the areas targeted by this assay, and inadequate number of viral copies(<138 copies/mL). A negative result must be combined with clinical observations, patient history, and epidemiological information. The expected result is Negative.  Fact Sheet for Patients:  BloggerCourse.com  Fact Sheet for Healthcare Providers:  SeriousBroker.it  This  test is no t yet approved or cleared by the [place] FDA and  has been authorized for detection and/or diagnosis of SARS-CoV-2 by FDA under an Emergency Use Authorization (EUA). This EUA will remain  in effect (meaning this test can be used) for the duration of the COVID-19 declaration under Section 564(b)(1) of the Act, 21 U.S.C.section 360bbb-3(b)(1), unless the authorization is terminated  or revoked sooner.       Influenza A by PCR NEGATIVE NEGATIVE Final   Influenza B by PCR NEGATIVE NEGATIVE Final    Comment: (NOTE) The Xpert Xpress SARS-CoV-2/FLU/RSV plus assay is intended as an aid in the diagnosis of influenza from Nasopharyngeal swab specimens and should not be used as a sole basis for treatment. Nasal washings and aspirates are unacceptable for Xpert Xpress SARS-CoV-2/FLU/RSV testing.  Fact Sheet for Patients: BloggerCourse.com  Fact Sheet for Healthcare Providers: SeriousBroker.it  This test is not yet approved or cleared by the [place] FDA and has been authorized for detection and/or diagnosis of SARS-CoV-2 by FDA under an Emergency Use Authorization (EUA). This EUA will remain in effect (meaning this test can be used) for the duration of the COVID-19 declaration under Section 564(b)(1) of the Act, 21 U.S.C. section 360bbb-3(b)(1), unless the authorization is terminated or revoked.     Resp Syncytial Virus by PCR NEGATIVE NEGATIVE Final    Comment: (NOTE) Fact Sheet for Patients: BloggerCourse.com  Fact Sheet for Healthcare Providers: SeriousBroker.it  This test is not yet approved or cleared by the [place] FDA and has been authorized for detection and/or diagnosis of SARS-CoV-2 by FDA under an Emergency Use Authorization (EUA). This EUA will remain in effect (meaning this test can be used) for the duration of the COVID-19 declaration under  Section 564(b)(1) of the Act, 21 U.S.C. section 360bbb-3(b)(1), unless the authorization is terminated or revoked.  Performed at [facility], [address], [place], [place] [address]          Radiology Studies: No results found.      Scheduled Meds:  allopurinol  100 mg Oral BID   carvedilol  6.25 mg Oral BID WC   cyanocobalamin  1,000 mcg Oral Daily   donepezil  10 mg Oral Daily   feeding supplement  237 mL Oral BID BM   icosapent Ethyl  2 g Oral BID   insulin aspart  0-15 Units Subcutaneous TID WC   insulin aspart  0-5 Units Subcutaneous QHS   losartan  25 mg Oral  Daily   memantine  10 mg Oral BID   multivitamin with minerals  1 tablet Oral Q lunch   QUEtiapine  100 mg Oral TID   rosuvastatin  20 mg Oral Daily   sertraline  50 mg Oral Daily   Continuous Infusions:     LOS: 5 days    Gillis Santa, MD [facility]   If 7PM-7AM, please contact night-coverage  08/11/2023, 1:06 PM

## 2023-08-11 NOTE — TOC Progression Note (Signed)
Transition of Care [facility]) - Progression Note    Patient Details  Name: Lindsey [name] MRN: [id number] Date of Birth: 08/14/1951  Transition of Care [facility]) CM/SW Contact  Bing Quarry, RN Phone Number: 08/11/2023, 11:12 AM  Clinical Narrative:  10/27: Cherlyn Roberts went to lvl II with requested additional documents uploaded by prior CM 10/25. Checked [place] [facility] and "Screen currently running" message received in portal at 1110 am 08/11/23. TOC will continue to monitor.  Gabriel Cirri MSN RN CM  Care Management Department.  [place]  [facility] Direct Dial: [phone] Main Office Phone: [phone] Weekends Only           Expected Discharge Plan and Services                                               Social Determinants of Health (SDOH) Interventions SDOH Screenings   Food Insecurity: No Food Insecurity (08/06/2023)  Housing: Low Risk  (08/06/2023)  Transportation Needs: No Transportation Needs (08/06/2023)  Utilities: Not At Risk (08/06/2023)  Alcohol Screen: Low Risk  (08/22/2022)  Depression (PHQ2-9): Low Risk  (05/30/2023)  Financial Resource Strain: Low Risk  (01/02/2023)  Physical Activity: Unknown (01/02/2023)  Social Connections: Socially Isolated (01/02/2023)  Stress: No Stress Concern Present (01/02/2023)  Tobacco Use: Low Risk  (08/06/2023)  Recent Concern: Tobacco Use - Medium Risk (07/10/2023)   Received from [facility]    Readmission Risk Interventions     No data to display

## 2023-08-12 DIAGNOSIS — B338 Other specified viral diseases: Secondary | ICD-10-CM | POA: Diagnosis not present

## 2023-08-12 DIAGNOSIS — W19XXXD Unspecified fall, subsequent encounter: Secondary | ICD-10-CM | POA: Diagnosis not present

## 2023-08-12 LAB — CBC
HCT: 32.7 % — ABNORMAL LOW (ref 36.0–46.0)
Hemoglobin: 11.1 g/dL — ABNORMAL LOW (ref 12.0–15.0)
MCH: 28.9 pg (ref 26.0–34.0)
MCHC: 33.9 g/dL (ref 30.0–36.0)
MCV: 85.2 fL (ref 80.0–100.0)
Platelets: 246 10*3/uL (ref 150–400)
RBC: 3.84 MIL/uL — ABNORMAL LOW (ref 3.87–5.11)
RDW: 13.2 % (ref 11.5–15.5)
WBC: 10 10*3/uL (ref 4.0–10.5)
nRBC: 0 % (ref 0.0–0.2)

## 2023-08-12 LAB — GLUCOSE, CAPILLARY
Glucose-Capillary: 184 mg/dL — ABNORMAL HIGH (ref 70–99)
Glucose-Capillary: 180 mg/dL — ABNORMAL HIGH (ref 70–99)
Glucose-Capillary: 173 mg/dL — ABNORMAL HIGH (ref 70–99)
Glucose-Capillary: 224 mg/dL — ABNORMAL HIGH (ref 70–99)

## 2023-08-12 LAB — BASIC METABOLIC PANEL
Anion gap: 14 (ref 5–15)
BUN: 28 mg/dL — ABNORMAL HIGH (ref 8–23)
CO2: 24 mmol/L (ref 22–32)
Calcium: 10.3 mg/dL (ref 8.9–10.3)
Chloride: 100 mmol/L (ref 98–111)
Creatinine, Ser: 0.86 mg/dL (ref 0.44–1.00)
GFR, Estimated: >60 mL/min (ref >60)
Glucose, Bld: 199 mg/dL — ABNORMAL HIGH (ref 70–99)
Potassium: 3.6 mmol/L (ref 3.5–5.1)
Sodium: 138 mmol/L (ref 135–145)

## 2023-08-12 LAB — URINALYSIS, W/ REFLEX TO CULTURE (INFECTION SUSPECTED)
Bacteria, UA: NONE SEEN
Bilirubin Urine: NEGATIVE
Glucose, UA: >=500 mg/dL — AB
Hgb urine dipstick: NEGATIVE
Ketones, ur: 5 mg/dL — AB
Leukocytes,Ua: NEGATIVE
Nitrite: NEGATIVE
Protein, ur: 30 mg/dL — AB
RBC / HPF: >50 RBC/hpf (ref 0–5)
Specific Gravity, Urine: 1.042 — ABNORMAL HIGH (ref 1.005–1.030)
Squamous Epithelial / HPF: 0 /[HPF] (ref 0–5)
pH: 5 (ref 5.0–8.0)

## 2023-08-12 LAB — PHOSPHORUS: Phosphorus: 2.6 mg/dL (ref 2.5–4.6)

## 2023-08-12 LAB — MAGNESIUM: Magnesium: 1.8 mg/dL (ref 1.7–2.4)

## 2023-08-12 NOTE — Plan of Care (Signed)

## 2023-08-12 NOTE — Progress Notes (Signed)
PROGRESS NOTE    Lindsey [name]  RUE:[id number] DOB: 1951/03/25 DOA: 08/05/2023 PCP: Alba Cory, MD    Brief Narrative:   72 y.o. female with medical history significant of dementia with behavioral disturbance, type 2 diabetes, hypertension, hyperlipidemia, depression, osteopenia who lives alone who was brought in for weakness and falls.   Patient unable to provide accurate history.  History obtained by speaking with the ER physician and reviewing the medical record.  Patient lives independently and does all of her activities of daily living with family support close by.  Family noted her to be increasingly weak and confused and patient had a witnessed fall.  Unclear if this was syncopal event or not.  Patient was brought to the ED for further evaluation.   Assessment & Plan:   Principal Problem:   Fall Active Problems:   Benign essential HTN   Dementia with behavioral disturbance (HCC)   Type 2 diabetes mellitus with microalbuminuria, without long-term current use of insulin (HCC)  Ambulatory dysfunction Family reports trouble getting out of bed, slid out of chair at home as well. Normally able to ambulate without assistance but does have a walker. Mri brain negative. MRI lumbar spine no signs canal stenosis or cord compression. PT advising snf - TOC consulted for SNF  Acute urinary retention On 10/23, required I/o cath x1, today urinating spontaneously, lumbar mri as above negative, PVRs neg - monitor  Chronic L1 and L3 compression fractures On MRI this admission - pain control, PT    Dementia with behavioral disturbance, Alzheimer's type AAOx 1 at baseline, With fluctuating delirium as well Continue sertraline 50 mg daily Continue donepezil 10 mg daily Continue memantine 10 mg daily 10/28 palliative care consulted for goals of care discussion.  Overall poor prognosis, patient is full code.  Hyperlipidemia PTA statin   Essential hypertension controlled PTA  Coreg PT Cozaar   History of gout PTA allopurinol   B12 deficiency B12 tablets   DVT prophylaxis: Lovenox Code Status: Full Family Communication: son updated telephonically 10/23 Disposition Plan: inpt   TOC following, working on SNF placement.   Level of care: Telemetry Medical  Consultants:  Palliative care  Procedures:  None  Antimicrobials: None   Subjective: No significant overnight events, patient was resting comfortably, denied any complaints.   Objective: Vitals:   08/11/23 1139 08/11/23 1542 08/11/23 2054 08/12/23 0846  BP: (!) 150/69 128/77 135/84 (!) 139/105  Pulse: (!) 108 (!) 109 (!) 105 (!) 110  Resp: 19 18 18 18   Temp: 98.3 F (36.8 C) 98.5 F (36.9 C) 98.2 F (36.8 C) 98.1 F (36.7 C)  TempSrc:   Oral   SpO2: 96% 94% 97%   Weight:      Height:        Intake/Output Summary (Last 24 hours) at 08/12/2023 1153 Last data filed at 08/11/2023 2115 Gross per 24 hour  Intake --  Output 600 ml  Net -600 ml    Filed Weights   08/05/23 0943  Weight: 65.8 kg    Examination:  General exam: Appears calm and comfortable  Respiratory system: Clear to auscultation. Respiratory effort normal. Cardiovascular system: S1-S2 2, tachycardic, regular rhythm, no murmurs Gastrointestinal system: Soft, NT/ND  Central nervous system: AAOx 1 name only, does not remember DOB. Calm. Moving all 4. Distal sensation intact Extremities: warm, no edema Skin: No visible rashes, lesions or ulcers Psychiatry: pleasantly confused    Data Reviewed: I have personally reviewed following labs and imaging studies  CBC:  Recent Labs  Lab 08/06/23 0507 08/09/23 0905 08/10/23 0529 08/11/23 0605 08/12/23 0440  WBC 8.6 8.1 8.9 9.0 10.0  HGB 11.0* 11.5* 11.3* 11.1* 11.1*  HCT 35.7* 34.0* 34.4* 33.3* 32.7*  MCV 93.0 84.8 86.0 86.3 85.2  PLT 158 215 232 242 246   Basic Metabolic Panel: Recent Labs  Lab 08/08/23 0645 08/09/23 0905 08/10/23 0529 08/11/23 0605  08/12/23 0440  NA 139 139 141 138 138  K 3.4* 3.6 3.7 3.6 3.6  CL 105 104 103 102 100  CO2 22 25 24 24 24   GLUCOSE 122* 137* 140* 179* 199*  BUN 20 20 21  27* 28*  CREATININE 0.93 0.88 0.95 0.93 0.86  CALCIUM 9.8 10.1 10.2 10.0 10.3  MG  --  1.7 1.7 1.7 1.8  PHOS  --  3.4 3.5 2.8 2.6   GFR: Estimated Creatinine Clearance: 48 mL/min (by C-G formula based on SCr of 0.86 mg/dL). Liver Function Tests: No results for input(s): "AST", "ALT", "ALKPHOS", "BILITOT", "PROT", "ALBUMIN" in the last 168 hours.  No results for input(s): "LIPASE", "AMYLASE" in the last 168 hours. No results for input(s): "AMMONIA" in the last 168 hours. Coagulation Profile: No results for input(s): "INR", "PROTIME" in the last 168 hours. Cardiac Enzymes: No results for input(s): "CKTOTAL", "CKMB", "CKMBINDEX", "TROPONINI" in the last 168 hours. BNP (last 3 results) No results for input(s): "PROBNP" in the last 8760 hours. HbA1C: No results for input(s): "HGBA1C" in the last 72 hours. CBG: Recent Labs  Lab 08/11/23 1143 08/11/23 1634 08/11/23 2053 08/12/23 0845 08/12/23 1138  GLUCAP 256* 182* 207* 184* 180*   Lipid Profile: No results for input(s): "CHOL", "HDL", "LDLCALC", "TRIG", "CHOLHDL", "LDLDIRECT" in the last 72 hours. Thyroid Function Tests: Recent Labs    08/10/23 0529 08/11/23 0605  TSH 1.943  --   FREET4  --  1.08   Anemia Panel: No results for input(s): "VITAMINB12", "FOLATE", "FERRITIN", "TIBC", "IRON", "RETICCTPCT" in the last 72 hours. Sepsis Labs: No results for input(s): "PROCALCITON", "LATICACIDVEN" in the last 168 hours.   Recent Results (from the past 240 hour(s))  Blood culture (routine x 2)     Status: None   Collection Time: 08/05/23 11:46 AM   Specimen: BLOOD RIGHT ARM  Result Value Ref Range Status   Specimen Description BLOOD RIGHT ARM  Final   Special Requests   Final    BOTTLES DRAWN AEROBIC AND ANAEROBIC Blood Culture results may not be optimal due to an  inadequate volume of blood received in culture bottles   Culture   Final    NO GROWTH 5 DAYS Performed at [facility], [address] [place], [place] [address]    Report Status 08/10/2023 FINAL  Final  Blood culture (routine x 2)     Status: None   Collection Time: 08/05/23 11:46 AM   Specimen: BLOOD LEFT HAND  Result Value Ref Range Status   Specimen Description BLOOD LEFT HAND  Final   Special Requests   Final    BOTTLES DRAWN AEROBIC AND ANAEROBIC Blood Culture results may not be optimal due to an inadequate volume of blood received in culture bottles   Culture   Final    NO GROWTH 5 DAYS Performed at [facility], [address] [place], [place] [address]    Report Status 08/10/2023 FINAL  Final  Resp panel by RT-PCR (RSV, Flu A&B, Covid) Anterior Nasal Swab     Status: None   Collection Time: 08/05/23 11:47 AM  Specimen: Anterior Nasal Swab  Result Value Ref Range Status   SARS Coronavirus 2 by RT PCR NEGATIVE NEGATIVE Final    Comment: (NOTE) SARS-CoV-2 target nucleic acids are NOT DETECTED.  The SARS-CoV-2 RNA is generally detectable in upper respiratory specimens during the acute phase of infection. The lowest concentration of SARS-CoV-2 viral copies this assay can detect is 138 copies/mL. A negative result does not preclude SARS-Cov-2 infection and should not be used as the sole basis for treatment or other patient management decisions. A negative result may occur with  improper specimen collection/handling, submission of specimen other than nasopharyngeal swab, presence of viral mutation(s) within the areas targeted by this assay, and inadequate number of viral copies(<138 copies/mL). A negative result must be combined with clinical observations, patient history, and epidemiological information. The expected result is Negative.  Fact Sheet for Patients:  BloggerCourse.com  Fact Sheet for Healthcare Providers:   SeriousBroker.it  This test is no t yet approved or cleared by the [place] FDA and  has been authorized for detection and/or diagnosis of SARS-CoV-2 by FDA under an Emergency Use Authorization (EUA). This EUA will remain  in effect (meaning this test can be used) for the duration of the COVID-19 declaration under Section 564(b)(1) of the Act, 21 U.S.C.section 360bbb-3(b)(1), unless the authorization is terminated  or revoked sooner.       Influenza A by PCR NEGATIVE NEGATIVE Final   Influenza B by PCR NEGATIVE NEGATIVE Final    Comment: (NOTE) The Xpert Xpress SARS-CoV-2/FLU/RSV plus assay is intended as an aid in the diagnosis of influenza from Nasopharyngeal swab specimens and should not be used as a sole basis for treatment. Nasal washings and aspirates are unacceptable for Xpert Xpress SARS-CoV-2/FLU/RSV testing.  Fact Sheet for Patients: BloggerCourse.com  Fact Sheet for Healthcare Providers: SeriousBroker.it  This test is not yet approved or cleared by the [place] FDA and has been authorized for detection and/or diagnosis of SARS-CoV-2 by FDA under an Emergency Use Authorization (EUA). This EUA will remain in effect (meaning this test can be used) for the duration of the COVID-19 declaration under Section 564(b)(1) of the Act, 21 U.S.C. section 360bbb-3(b)(1), unless the authorization is terminated or revoked.     Resp Syncytial Virus by PCR NEGATIVE NEGATIVE Final    Comment: (NOTE) Fact Sheet for Patients: BloggerCourse.com  Fact Sheet for Healthcare Providers: SeriousBroker.it  This test is not yet approved or cleared by the [place] FDA and has been authorized for detection and/or diagnosis of SARS-CoV-2 by FDA under an Emergency Use Authorization (EUA). This EUA will remain in effect (meaning this test can be used) for  the duration of the COVID-19 declaration under Section 564(b)(1) of the Act, 21 U.S.C. section 360bbb-3(b)(1), unless the authorization is terminated or revoked.  Performed at [facility], [address], [place], [place] [address]          Radiology Studies: No results found.      Scheduled Meds:  allopurinol  100 mg Oral BID   carvedilol  6.25 mg Oral BID WC   cyanocobalamin  1,000 mcg Oral Daily   donepezil  10 mg Oral Daily   feeding supplement  237 mL Oral BID BM   icosapent Ethyl  2 g Oral BID   insulin aspart  0-15 Units Subcutaneous TID WC   insulin aspart  0-5 Units Subcutaneous QHS   losartan  25 mg Oral Daily   memantine  10 mg Oral BID  multivitamin with minerals  1 tablet Oral Q lunch   QUEtiapine  100 mg Oral TID   rosuvastatin  20 mg Oral Daily   sertraline  50 mg Oral Daily   Continuous Infusions:  Time spent: 35 min     LOS: 6 days    Gillis Santa, MD [facility]   If 7PM-7AM, please contact night-coverage  08/12/2023, 11:53 AM

## 2023-08-12 NOTE — Progress Notes (Signed)
Occupational Therapy Treatment Patient Details Name: Lindsey [name] MRN: [id number] DOB: 01-Jan-1951 Today's Date: 08/12/2023   History of present illness Lindsey [name] is a 72 y.o. female with medical history significant of dementia with behavioral disturbance, type 2 diabetes, hypertension, hyperlipidemia, depression, osteopenia who lives alone who was brought in for weakness and falls. Patient unable to provide accurate history.  History obtained by speaking with the ER physician and reviewing the medical record.  Patient lives independently and does all of her activities of daily living with family support close by.  Family noted her to be increasingly weak and confused and patient had a witnessed fall.  Unclear if this was syncopal event or not.   OT comments  Pt received semi-reclined in bed, leaning to the L side in bed; bed soiled with urine despite purewick in palce. Appearing lethargic and confused; willing to work with OT on changing sheets and attempting to stand; requiring lots of encouragement, as pt stating "hold on. I want to rest.". Sheets changed with assist of NA. T/f MAX A x2 to EOB with mobility specialist assisting. See flowsheet below for further details of session. Left seated EOB with PTA in room and mobility specialist; with all needs in reach. MD and RN notified of pt declining status since last session. Patient will benefit from continued OT while in acute care.       If plan is discharge home, recommend the following:  Two people to help with walking and/or transfers;Two people to help with bathing/dressing/bathroom;Assistance with cooking/housework;Assistance with feeding;Direct supervision/assist for medications management;Direct supervision/assist for financial management;Help with stairs or ramp for entrance;Assist for transportation;Supervision due to cognitive status   Equipment Recommendations  Other (comment) (defer)    Recommendations for Other Services       Precautions / Restrictions Precautions Precautions: Fall;Other (comment) Precaution Comments: AMS Restrictions Weight Bearing Restrictions: No       Mobility Bed Mobility Overal bed mobility: Needs Assistance Bed Mobility: Supine to Sit, Rolling Rolling: Max assist   Supine to sit: Max assist, +2 for physical assistance     General bed mobility comments: Pt left seated EOB with mobility specialist and PTA.    Transfers Overall transfer level: Needs assistance Equipment used: 2 person hand held assist Transfers: Sit to/from Stand Sit to Stand: Max assist, +2 physical assistance           General transfer comment: attempted stand with MAX A x2; pt not helping very much and only able to do partial stand.     Balance Overall balance assessment: Needs assistance Sitting-balance support: Bilateral upper extremity supported, Feet supported Sitting balance-Leahy Scale: Fair Sitting balance - Comments: able to maintain static sitting with close supervision for safety.                                   ADL either performed or assessed with clinical judgement   ADL Overall ADL's : Needs assistance/impaired     Grooming: Wash/dry face;Maximal assistance;Bed level;Brushing hair;Sitting Grooming Details (indicate cue type and reason): Pt requiring MAX A for grooming tasks today (washing face in bed; combing hair at EOB). pt briefly holds object, then will drop it; attempted to comb hair, but did one motion through hair, then dropped hand to lap. Upper Body Bathing: Total assistance;Bed level Upper Body Bathing Details (indicate cue type and reason): assisted with washing upper body while sidelying in bed  General ADL Comments: Anticipate MAX A for all ADLs today. Full bed change with MAX A to roll while NA assisted OT in changing bed sheets.    Extremity/Trunk Assessment Upper Extremity Assessment Upper Extremity Assessment:  Generalized weakness   Lower Extremity Assessment Lower Extremity Assessment: Generalized weakness;Defer to PT evaluation        Vision       Perception     Praxis      Cognition Arousal: Lethargic Behavior During Therapy: Flat affect, Anxious Overall Cognitive Status: Impaired/Different from baseline                                 General Comments: Not oriented, asking where her dad is, not following commands well today; appearing very fatigued/lethargic. At times difficult to understand. Big change from last week's presentation. Asking to rest; decreased volition for movement today.        Exercises      Shoulder Instructions       General Comments OT notified MD and RN of pt's decline in status since last OT session.    Pertinent Vitals/ Pain       Pain Assessment Pain Assessment: PAINAD Breathing: normal Negative Vocalization: occasional moan/groan, low speech, negative/disapproving quality Facial Expression: sad, frightened, frown Body Language: tense, distressed pacing, fidgeting Consolability: no need to console PAINAD Score: 3 Pain Intervention(s): Limited activity within patient's tolerance, Monitored during session  Home Living                                          Prior Functioning/Environment              Frequency  Min 1X/week        Progress Toward Goals  OT Goals(current goals can now be found in the care plan section)  Progress towards OT goals: Not progressing toward goals - comment (decline in function since last session)  Acute Rehab OT Goals Patient Stated Goal: did not state OT Goal Formulation: Patient unable to participate in goal setting Time For Goal Achievement: 08/20/23 Potential to Achieve Goals: Poor ADL Goals Pt Will Perform Grooming: with modified independence;standing Pt Will Perform Lower Body Dressing: with modified independence;sit to/from stand Pt Will Transfer to Toilet:  with modified independence;ambulating;regular height toilet Pt Will Perform Toileting - Clothing Manipulation and hygiene: with modified independence;sit to/from stand  Plan      Co-evaluation                 AM-PAC OT "6 Clicks" Daily Activity     Outcome Measure   Help from another person eating meals?: A Lot Help from another person taking care of personal grooming?: A Lot Help from another person toileting, which includes using toliet, bedpan, or urinal?: Total Help from another person bathing (including washing, rinsing, drying)?: Total Help from another person to put on and taking off regular upper body clothing?: Total Help from another person to put on and taking off regular lower body clothing?: Total 6 Click Score: 8    End of Session Equipment Utilized During Treatment: Gait belt  OT Visit Diagnosis: Unsteadiness on feet (R26.81);Other symptoms and signs involving cognitive function   Activity Tolerance Patient limited by fatigue;Other (comment) (limited by AMS)   Patient Left Other (comment) (seated EOB with PTA and mobility specialist)  Nurse Communication Mobility status;Other (comment) (decreased alertness)        Time: 1610-9604 OT Time Calculation (min): 28 min  Charges: OT General Charges $OT Visit: 1 Visit OT Treatments $Self Care/Home Management : 8-22 mins $Therapeutic Activity: 8-22 mins  Linward Foster, MS, OTR/L   Alvester Morin 08/12/2023, 11:34 AM

## 2023-08-12 NOTE — Progress Notes (Signed)
Physical Therapy Treatment Patient Details Name: Lindsey [name] MRN: [id number] DOB: 1951/04/05 Today's Date: 08/12/2023   History of Present Illness Lindsey [name] is a 72 y.o. female with medical history significant of dementia with behavioral disturbance, type 2 diabetes, hypertension, hyperlipidemia, depression, osteopenia who lives alone who was brought in for weakness and falls. Patient unable to provide accurate history.  History obtained by speaking with the ER physician and reviewing the medical record.  Patient lives independently and does all of her activities of daily living with family support close by.  Family noted her to be increasingly weak and confused and patient had a witnessed fall.  Unclear if this was syncopal event or not.    PT Comments  Pt received sitting EOB after finishing with OT. Pt remains lethargic, forward and Right lateral lean in sitting. Several attempts to stand at bedside with RW and MaxA of 2, Pt unable to raise buttocks off bed. Limited tolerance for activity due to lethargy and cognition compared to five days prior when she was ambulating short distances. Contacted MD through secure chat with concerns, Palliative Consult placed. Pt positioned in upright long sitting in bed with caregiver at bedside.     If plan is discharge home, recommend the following: Assistance with feeding;Assistance with cooking/housework;Direct supervision/assist for medications management;Direct supervision/assist for financial management;Assist for transportation;Help with stairs or ramp for entrance;Supervision due to cognitive status;A lot of help with walking and/or transfers;A lot of help with bathing/dressing/bathroom   Can travel by private vehicle     No  Equipment Recommendations  None recommended by PT    Recommendations for Other Services       Precautions / Restrictions Precautions Precautions: Fall;Other (comment) Precaution Comments: AMS Restrictions Weight  Bearing Restrictions: No     Mobility  Bed Mobility Overal bed mobility: Needs Assistance Bed Mobility: Supine to Sit, Rolling Rolling: Max assist     Sit to supine: Max assist, +2 for physical assistance   General bed mobility comments: Pt assisted back to bed after finishing EOB activity with OT and attempting standing with PT    Transfers Overall transfer level: Needs assistance Equipment used: Rolling walker (2 wheels) Transfers: Sit to/from Stand Sit to Stand: Max assist, +2 physical assistance           General transfer comment: Several attempts to stand at RW, Pt initiated hand placement, however could not raise buttocks off bed with MaxAx2    Ambulation/Gait               General Gait Details: Pt has not tolerated gait training since 10/23 due to lethargy and change in MS   Stairs             Wheelchair Mobility     Tilt Bed    Modified Rankin (Stroke Patients Only)       Balance Overall balance assessment: Needs assistance Sitting-balance support: Bilateral upper extremity supported, Feet supported Sitting balance-Leahy Scale: Poor   Postural control: Right lateral lean (Forward lean) Standing balance support: Bilateral upper extremity supported, Reliant on assistive device for balance Standing balance-Leahy Scale: Zero                              Cognition Arousal: Lethargic Behavior During Therapy: Flat affect, Anxious Overall Cognitive Status: Impaired/Different from baseline  General Comments: Alzheimer's type dementia        Exercises      General Comments General comments (skin integrity, edema, etc.):  (MD notified of concerns in functional and cognitive decline over last 5 days. Palliative Consulted)      Pertinent Vitals/Pain Pain Assessment Pain Assessment: No/denies pain    Home Living                          Prior Function             PT Goals (current goals can now be found in the care plan section) Acute Rehab PT Goals Patient Stated Goal: go home Progress towards PT goals: Not progressing toward goals - comment    Frequency    Min 2X/week      PT Plan      Co-evaluation              AM-PAC PT "6 Clicks" Mobility   Outcome Measure  Help needed turning from your back to your side while in a flat bed without using bedrails?: A Lot Help needed moving from lying on your back to sitting on the side of a flat bed without using bedrails?: A Lot Help needed moving to and from a bed to a chair (including a wheelchair)?: A Lot Help needed standing up from a chair using your arms (e.g., wheelchair or bedside chair)?: Total Help needed to walk in hospital room?: Total Help needed climbing 3-5 steps with a railing? : Total 6 Click Score: 9    End of Session Equipment Utilized During Treatment: Gait belt Activity Tolerance: Patient limited by lethargy Patient left: in bed;with call bell/phone within reach;with bed alarm set;with family/visitor present Nurse Communication: Mobility status PT Visit Diagnosis: Muscle weakness (generalized) (M62.81);Difficulty in walking, not elsewhere classified (R26.2)     Time: 0981-1914 PT Time Calculation (min) (ACUTE ONLY): 11 min  Charges:    $Therapeutic Activity: 8-22 mins PT General Charges $$ ACUTE PT VISIT: 1 Visit                    Zadie Cleverly, PTA  [name] 08/12/2023, 1:11 PM

## 2023-08-13 DIAGNOSIS — Z515 Encounter for palliative care: Secondary | ICD-10-CM

## 2023-08-13 DIAGNOSIS — F03918 Unspecified dementia, unspecified severity, with other behavioral disturbance: Secondary | ICD-10-CM

## 2023-08-13 DIAGNOSIS — R809 Proteinuria, unspecified: Secondary | ICD-10-CM

## 2023-08-13 DIAGNOSIS — W19XXXD Unspecified fall, subsequent encounter: Secondary | ICD-10-CM | POA: Diagnosis not present

## 2023-08-13 DIAGNOSIS — E1129 Type 2 diabetes mellitus with other diabetic kidney complication: Secondary | ICD-10-CM

## 2023-08-13 LAB — GLUCOSE, CAPILLARY
Glucose-Capillary: 167 mg/dL — ABNORMAL HIGH (ref 70–99)
Glucose-Capillary: 221 mg/dL — ABNORMAL HIGH (ref 70–99)
Glucose-Capillary: 341 mg/dL — ABNORMAL HIGH (ref 70–99)
Glucose-Capillary: 317 mg/dL — ABNORMAL HIGH (ref 70–99)

## 2023-08-13 NOTE — TOC Progression Note (Addendum)
Transition of Care [facility]) - Progression Note    Patient Details  Name: Lindsey [name] MRN: [id number] Date of Birth: [birth year]  [facility] CM/SW Contact  Allena Katz, LCSW Phone Number: 08/13/2023, 2:31 PM  Clinical Narrative:      Requested documentation uploaded to [facility]. Message left with daughter to update her on SNF recommendation. Pt disoriented X4.   2:38pm   CSW spoke with daughter regarding SNF she would like referral sent out around the [place] area with a preference for [facility]. Message sent to tiffanie at [facility].      Expected Discharge Plan and Services                                               Social Determinants of Health (SDOH) Interventions SDOH Screenings   Food Insecurity: No Food Insecurity (08/06/2023)  Housing: Low Risk  (08/06/2023)  Transportation Needs: No Transportation Needs (08/06/2023)  Utilities: Not At Risk (08/06/2023)  Alcohol Screen: Low Risk  (08/22/2022)  Depression (PHQ2-9): Low Risk  (05/30/2023)  Financial Resource Strain: Low Risk  (01/02/2023)  Physical Activity: Unknown (01/02/2023)  Social Connections: Socially Isolated (01/02/2023)  Stress: No Stress Concern Present (01/02/2023)  Tobacco Use: Low Risk  (08/06/2023)  Recent Concern: Tobacco Use - Medium Risk (07/10/2023)   Received from [facility]    Readmission Risk Interventions     No data to display

## 2023-08-13 NOTE — Consult Note (Addendum)
Consultation Note Date: 08/13/2023 at 1400  Patient Name: Lindsey [name]  DOB: [birth year]  MRN: [id number]  Age / Sex: 72 y.o., female  PCP: Alba Cory, MD Referring Physician: Gillis Santa, MD  HPI/Patient Profile: 72 y.o. female  with past medical history of dementia, type 2 diabetes, HTN, HLD, depression, osteopenia, B12 deficiency, and gout admitted on 08/05/2023 with weakness and falls.   Patient is being treated for ambulatory dysfunction, acute urinary retention, L1 and L3 chronic compression fractures, and dementia with behavioral disturbances.  MRI of brain revealed no acute intracranial abnormalities.  PMT was consulted to discuss goals of care.  Clinical Assessment and Goals of Care: Extensive chart review completed prior to meeting patient including labs, vital signs, imaging, progress notes, orders, and available advanced directive documents from current and previous encounters. I then met with patient at bedside.  She is resting in no apparent distress.  She is asleep, easily awakens, and goes back to sleep.  She is unable to participate in medical decision making or goals of care discussions independently at this time.  I counseled with dayshift RN who is in agreement that patient lacks capacity to make decisions for herself at this time.  After visiting with the patient, I spoke with her son Lindsey [name] over the phone to discuss diagnosis prognosis, GOC, EOL wishes, disposition and options.  I introduced Palliative Medicine as specialized medical care for people living with serious illness. It focuses on providing relief from the symptoms and stress of a serious illness. The goal is to improve quality of life for both the patient and the family.  We discussed a brief life review of the patient.  Patient is a mother of 3 who lives at home alone.  Lindsey [name] shares family has care provided during  the day to help with daily cares.    We discussed patient's current illness and what it means in the larger context of patient's on-going co-morbidities.  Discussed dementia as a progressive, chronic, and irreversible disease that is often exacerbated by acute illnesses and hospitalizations.  Lindsey [name] shares that she has good and bad days but that she is seems to have more significant memory loss and issues during this hospitalization than while at home PTA.  I attempted to elicit values and goals of care important to the patient.  Lindsey [name] shares her wishes always been to remain at home for as long as possible.  He shares that he and his siblings are attempting to honor her wishes for as long as possible.   Advance directives, concepts specific to code status, artificial feeding and hydration, and rehospitalization were considered and discussed.  Lindsey [name] states that his mother does not have a living will or advanced directive.  He shares he is never discussed these with her.  Full code versus DNR with limited interventions discussed in detail.Encouraged Lindsey [name] to consider DNR/DNI status understanding evidenced based poor outcomes in similar hospitalized patients, as the cause of the arrest is likely associated with chronic/terminal disease rather than a reversible acute  cardio-pulmonary event.  Shares he would want to have a discussion with his siblings before making any final decision for his mother.  Full code remains.  Education offered regarding concept specific to human mortality and the limitations of medical interventions to prolong life when the body begins to fail to thrive.  Discussed patient's functional, nutritional, and cognitive status as significant indicators the patient's overall prognosis.  Family is facing treatment option decisions, advanced directive, and anticipatory care needs.  Current plan is for patient to transfer to rehab.  Lindsey [name] shares that concerned about what patient's needs  will be like after rehab.  Therapeutic silence, active listening, and emotional support provided.   Discussed with patient/family the importance of continued conversation with family and the medical providers regarding overall plan of care and treatment options, ensuring decisions are within the context of the patient's values and GOCs.    Lindsey [name] agreeable for PMT to continue to follow-up with him.  PMT will continue to follow and support patient throughout her hospitalization.  Primary Decision Maker NEXT OF KIN  Physical Exam Vitals reviewed.  Constitutional:      General: She is not in acute distress.    Appearance: She is normal weight.  HENT:     Head: Normocephalic.     Mouth/Throat:     Mouth: Mucous membranes are moist.  Eyes:     Pupils: Pupils are equal, round, and reactive to light.  Pulmonary:     Effort: Pulmonary effort is normal.  Abdominal:     Palpations: Abdomen is soft.  Musculoskeletal:     Comments: Generalized weakness  Skin:    General: Skin is warm and dry.     Coloration: Skin is pale.  Neurological:     Mental Status: She is alert.  Psychiatric:        Behavior: Behavior normal.     Palliative Assessment/Data: 50%     Thank you for this consult. Palliative medicine will continue to follow and assist holistically.   Time Total: 75 minutes  Time spent includes: Detailed review of medical records (labs, imaging, vital signs), medically appropriate exam (mental status, respiratory, cardiac, skin), discussed with treatment team, counseling and educating patient, family and staff, documenting clinical information, medication management and coordination of care.  Signed by: Georgiann Cocker, DNP, FNP-BC Palliative Medicine   Please contact Palliative Medicine Team providers via [facility] for questions and concerns.

## 2023-08-13 NOTE — Progress Notes (Signed)
Mobility Specialist - Progress Note   08/13/23 1000  Mobility  Activity Stood at bedside;Dangled on edge of bed  Level of Assistance +2 (takes two people)  Nurse, learning disability of Motion/Exercises Active Assistive;Active;Right leg;Left leg  Activity Response Tolerated fair  $Mobility charge 1 Mobility     Pt lying in bed upon arrival, utilizing RA. Pt awakened by voice, agreeable to activity. Face cloth used for increased arousal. Pt completed bed mobility with modA +2 + task initiation. Pt a little restrictive to movement. STS x3 with modA +2 increasing to maxA +2 as pt fatigues. Noted soiled sheets upon standing. Pt returned supine, rolled L/R with maxA. New linen provided. Pt left in bed with alarm set, needs in reach.    Filiberto Pinks Mobility Specialist 08/13/23, 10:36 AM

## 2023-08-13 NOTE — Plan of Care (Signed)

## 2023-08-13 NOTE — Progress Notes (Signed)
MEWS Progress Note  Patient Details Name: Lindsey [name] MRN: [id number] DOB: [birth year] Today's Date: 08/13/2023   MEWS Flowsheet Documentation:  Assess: MEWS Score Temp: 98.7 F (37.1 C) BP: (!) 113/51 MAP (mmHg): 71 Pulse Rate: (!) 118 Resp: 18 Level of Consciousness: Alert SpO2: 94 % O2 Device: Room Air Assess: MEWS Score MEWS Temp: 0 MEWS Systolic: 0 MEWS Pulse: 2 MEWS RR: 0 MEWS LOC: 0 MEWS Score: 2 MEWS Score Color: Yellow Assess: SIRS CRITERIA SIRS Temperature : 0 SIRS Respirations : 0 SIRS Pulse: 1 SIRS WBC: 0 SIRS Score Sum : 1 SIRS Temperature : 0 SIRS Pulse: 1 SIRS Respirations : 0 SIRS WBC: 0 SIRS Score Sum : 1 Assess: if the MEWS score is Yellow or Red Were vital signs accurate and taken at a resting state?: Yes Does the patient meet 2 or more of the SIRS criteria?: No MEWS guidelines implemented : Yes, yellow Treat MEWS Interventions: Considered administering scheduled or prn medications/treatments as ordered Take Vital Signs Increase Vital Sign Frequency : Yellow: Q2hr x1, continue Q4hrs until patient remains green for 12hrs Escalate MEWS: Escalate: Yellow: Discuss with charge nurse and consider notifying provider and/or RRT Notify: Charge Nurse/RN Name of Charge Nurse/RN Notified: Annice Pih, RN Provider Notification Provider Name/Title: Lindajo Royal, MD Date Provider Notified: 08/13/23 Time Provider Notified: 2022 Method of Notification: Page (secure chat) Notification Reason: Other (Comment) (MEWS yellow d/t HR)      Ernest Mallick 08/13/2023, 8:29 PM

## 2023-08-13 NOTE — Progress Notes (Signed)
PROGRESS NOTE    Lindsey [name]  UJW:[id number] DOB: [birth year] DOA: 08/05/2023 PCP: Alba Cory, MD    Brief Narrative:   72 y.o. female with medical history significant of dementia with behavioral disturbance, type 2 diabetes, hypertension, hyperlipidemia, depression, osteopenia who lives alone who was brought in for weakness and falls.   Patient unable to provide accurate history.  History obtained by speaking with the ER physician and reviewing the medical record.  Patient lives independently and does all of her activities of daily living with family support close by.  Family noted her to be increasingly weak and confused and patient had a witnessed fall.  Unclear if this was syncopal event or not.  Patient was brought to the ED for further evaluation.   Assessment & Plan:   Principal Problem:   Fall Active Problems:   Benign essential HTN   Dementia with behavioral disturbance (HCC)   Type 2 diabetes mellitus with microalbuminuria, without long-term current use of insulin (HCC)  Ambulatory dysfunction Family reports trouble getting out of bed, slid out of chair at home as well. Normally able to ambulate without assistance but does have a walker. Mri brain negative. MRI lumbar spine no signs canal stenosis or cord compression. PT advising snf - TOC consulted for SNF  Acute urinary retention On 10/23, required I/o cath x1, today urinating spontaneously, lumbar mri as above negative, PVRs neg - monitor  Chronic L1 and L3 compression fractures On MRI this admission - pain control, PT    Dementia with behavioral disturbance, Alzheimer's type AAOx 1 at baseline, With fluctuating delirium as well Continue sertraline 50 mg daily Continue donepezil 10 mg daily Continue memantine 10 mg daily 10/28 palliative care consulted for goals of care discussion.  Overall poor prognosis, patient is full code.  Hyperlipidemia PTA statin   Essential hypertension controlled PTA  Coreg PT Cozaar   History of gout PTA allopurinol   B12 deficiency B12 tablets   DVT prophylaxis: Lovenox Code Status: Full Family Communication: son updated telephonically 10/23 Disposition Plan: inpt   TOC following, working on SNF placement.   Level of care: Telemetry Medical  Consultants:  Palliative care  Procedures:  None  Antimicrobials: None   Subjective: No significant overnight events, patient was resting comfortably, denied any complaints.   Objective: Vitals:   08/12/23 1711 08/12/23 2112 08/13/23 0636 08/13/23 0822  BP: 132/82 (!) 125/91 (!) 141/80 (!) 145/86  Pulse:  (!) 109    Resp: 16   17  Temp:  98.3 F (36.8 C) 97.7 F (36.5 C) 98 F (36.7 C)  TempSrc:  Oral Oral   SpO2: 97% 93% 94% 96%  Weight:      Height:        Intake/Output Summary (Last 24 hours) at 08/13/2023 1336 Last data filed at 08/13/2023 1018 Gross per 24 hour  Intake 120 ml  Output 501 ml  Net -381 ml    Filed Weights   08/05/23 0943  Weight: 65.8 kg    Examination:  General exam: Appears calm and comfortable  Respiratory system: Clear to auscultation. Respiratory effort normal. Cardiovascular system: S1-S2 2, tachycardic, regular rhythm, no murmurs Gastrointestinal system: Soft, NT/ND  Central nervous system: AAOx 1 name only, does not remember DOB. Calm. Moving all 4. Distal sensation intact Extremities: warm, no edema Skin: No visible rashes, lesions or ulcers Psychiatry: pleasantly confused    Data Reviewed: I have personally reviewed following labs and imaging studies  CBC: Recent Labs  Lab  08/09/23 0905 08/10/23 0529 08/11/23 0605 08/12/23 0440  WBC 8.1 8.9 9.0 10.0  HGB 11.5* 11.3* 11.1* 11.1*  HCT 34.0* 34.4* 33.3* 32.7*  MCV 84.8 86.0 86.3 85.2  PLT 215 232 242 246   Basic Metabolic Panel: Recent Labs  Lab 08/08/23 0645 08/09/23 0905 08/10/23 0529 08/11/23 0605 08/12/23 0440  NA 139 139 141 138 138  K 3.4* 3.6 3.7 3.6 3.6  CL  105 104 103 102 100  CO2 22 25 24 24 24   GLUCOSE 122* 137* 140* 179* 199*  BUN 20 20 21  27* 28*  CREATININE 0.93 0.88 0.95 0.93 0.86  CALCIUM 9.8 10.1 10.2 10.0 10.3  MG  --  1.7 1.7 1.7 1.8  PHOS  --  3.4 3.5 2.8 2.6   GFR: Estimated Creatinine Clearance: 48 mL/min (by C-G formula based on SCr of 0.86 mg/dL). Liver Function Tests: No results for input(s): "AST", "ALT", "ALKPHOS", "BILITOT", "PROT", "ALBUMIN" in the last 168 hours.  No results for input(s): "LIPASE", "AMYLASE" in the last 168 hours. No results for input(s): "AMMONIA" in the last 168 hours. Coagulation Profile: No results for input(s): "INR", "PROTIME" in the last 168 hours. Cardiac Enzymes: No results for input(s): "CKTOTAL", "CKMB", "CKMBINDEX", "TROPONINI" in the last 168 hours. BNP (last 3 results) No results for input(s): "PROBNP" in the last 8760 hours. HbA1C: No results for input(s): "HGBA1C" in the last 72 hours. CBG: Recent Labs  Lab 08/12/23 1138 08/12/23 1554 08/12/23 2119 08/13/23 0824 08/13/23 1127  GLUCAP 180* 173* 224* 167* 221*   Lipid Profile: No results for input(s): "CHOL", "HDL", "LDLCALC", "TRIG", "CHOLHDL", "LDLDIRECT" in the last 72 hours. Thyroid Function Tests: Recent Labs    08/11/23 0605  FREET4 1.08   Anemia Panel: No results for input(s): "VITAMINB12", "FOLATE", "FERRITIN", "TIBC", "IRON", "RETICCTPCT" in the last 72 hours. Sepsis Labs: No results for input(s): "PROCALCITON", "LATICACIDVEN" in the last 168 hours.   Recent Results (from the past 240 hour(s))  Blood culture (routine x 2)     Status: None   Collection Time: 08/05/23 11:46 AM   Specimen: BLOOD RIGHT ARM  Result Value Ref Range Status   Specimen Description BLOOD RIGHT ARM  Final   Special Requests   Final    BOTTLES DRAWN AEROBIC AND ANAEROBIC Blood Culture results may not be optimal due to an inadequate volume of blood received in culture bottles   Culture   Final    NO GROWTH 5 DAYS Performed at  [facility], [address] [place], [place] [address]    Report Status 08/10/2023 FINAL  Final  Blood culture (routine x 2)     Status: None   Collection Time: 08/05/23 11:46 AM   Specimen: BLOOD LEFT HAND  Result Value Ref Range Status   Specimen Description BLOOD LEFT HAND  Final   Special Requests   Final    BOTTLES DRAWN AEROBIC AND ANAEROBIC Blood Culture results may not be optimal due to an inadequate volume of blood received in culture bottles   Culture   Final    NO GROWTH 5 DAYS Performed at [facility], [address] [place], [place] [address]    Report Status 08/10/2023 FINAL  Final  Resp panel by RT-PCR (RSV, Flu A&B, Covid) Anterior Nasal Swab     Status: None   Collection Time: 08/05/23 11:47 AM   Specimen: Anterior Nasal Swab  Result Value Ref Range Status   SARS Coronavirus 2 by RT PCR NEGATIVE NEGATIVE Final  Comment: (NOTE) SARS-CoV-2 target nucleic acids are NOT DETECTED.  The SARS-CoV-2 RNA is generally detectable in upper respiratory specimens during the acute phase of infection. The lowest concentration of SARS-CoV-2 viral copies this assay can detect is 138 copies/mL. A negative result does not preclude SARS-Cov-2 infection and should not be used as the sole basis for treatment or other patient management decisions. A negative result may occur with  improper specimen collection/handling, submission of specimen other than nasopharyngeal swab, presence of viral mutation(s) within the areas targeted by this assay, and inadequate number of viral copies(<138 copies/mL). A negative result must be combined with clinical observations, patient history, and epidemiological information. The expected result is Negative.  Fact Sheet for Patients:  BloggerCourse.com  Fact Sheet for Healthcare Providers:  SeriousBroker.it  This test is no t yet approved or cleared by the [place] FDA  and  has been authorized for detection and/or diagnosis of SARS-CoV-2 by FDA under an Emergency Use Authorization (EUA). This EUA will remain  in effect (meaning this test can be used) for the duration of the COVID-19 declaration under Section 564(b)(1) of the Act, 21 U.S.C.section 360bbb-3(b)(1), unless the authorization is terminated  or revoked sooner.       Influenza A by PCR NEGATIVE NEGATIVE Final   Influenza B by PCR NEGATIVE NEGATIVE Final    Comment: (NOTE) The Xpert Xpress SARS-CoV-2/FLU/RSV plus assay is intended as an aid in the diagnosis of influenza from Nasopharyngeal swab specimens and should not be used as a sole basis for treatment. Nasal washings and aspirates are unacceptable for Xpert Xpress SARS-CoV-2/FLU/RSV testing.  Fact Sheet for Patients: BloggerCourse.com  Fact Sheet for Healthcare Providers: SeriousBroker.it  This test is not yet approved or cleared by the [place] FDA and has been authorized for detection and/or diagnosis of SARS-CoV-2 by FDA under an Emergency Use Authorization (EUA). This EUA will remain in effect (meaning this test can be used) for the duration of the COVID-19 declaration under Section 564(b)(1) of the Act, 21 U.S.C. section 360bbb-3(b)(1), unless the authorization is terminated or revoked.     Resp Syncytial Virus by PCR NEGATIVE NEGATIVE Final    Comment: (NOTE) Fact Sheet for Patients: BloggerCourse.com  Fact Sheet for Healthcare Providers: SeriousBroker.it  This test is not yet approved or cleared by the [place] FDA and has been authorized for detection and/or diagnosis of SARS-CoV-2 by FDA under an Emergency Use Authorization (EUA). This EUA will remain in effect (meaning this test can be used) for the duration of the COVID-19 declaration under Section 564(b)(1) of the Act, 21 U.S.C. section 360bbb-3(b)(1),  unless the authorization is terminated or revoked.  Performed at [facility], [address], [place], [place] [address]          Radiology Studies: No results found.      Scheduled Meds:  allopurinol  100 mg Oral BID   carvedilol  6.25 mg Oral BID WC   cyanocobalamin  1,000 mcg Oral Daily   donepezil  10 mg Oral Daily   feeding supplement  237 mL Oral BID BM   icosapent Ethyl  2 g Oral BID   insulin aspart  0-15 Units Subcutaneous TID WC   insulin aspart  0-5 Units Subcutaneous QHS   losartan  25 mg Oral Daily   memantine  10 mg Oral BID   multivitamin with minerals  1 tablet Oral Q lunch   QUEtiapine  100 mg Oral TID   rosuvastatin  20 mg Oral  Daily   sertraline  50 mg Oral Daily   Continuous Infusions:  Time spent: 25 min     LOS: 7 days    Gillis Santa, MD [facility]   If 7PM-7AM, please contact night-coverage  08/13/2023, 1:36 PM

## 2023-08-14 DIAGNOSIS — F05 Delirium due to known physiological condition: Secondary | ICD-10-CM

## 2023-08-14 DIAGNOSIS — F03918 Unspecified dementia, unspecified severity, with other behavioral disturbance: Secondary | ICD-10-CM | POA: Diagnosis not present

## 2023-08-14 DIAGNOSIS — Z515 Encounter for palliative care: Secondary | ICD-10-CM | POA: Diagnosis not present

## 2023-08-14 DIAGNOSIS — R531 Weakness: Secondary | ICD-10-CM

## 2023-08-14 LAB — GLUCOSE, CAPILLARY
Glucose-Capillary: 181 mg/dL — ABNORMAL HIGH (ref 70–99)
Glucose-Capillary: 182 mg/dL — ABNORMAL HIGH (ref 70–99)
Glucose-Capillary: 206 mg/dL — ABNORMAL HIGH (ref 70–99)
Glucose-Capillary: 217 mg/dL — ABNORMAL HIGH (ref 70–99)
Glucose-Capillary: 235 mg/dL — ABNORMAL HIGH (ref 70–99)
Glucose-Capillary: 257 mg/dL — ABNORMAL HIGH (ref 70–99)

## 2023-08-14 LAB — PHOSPHORUS: Phosphorus: 2.8 mg/dL (ref 2.5–4.6)

## 2023-08-14 LAB — BASIC METABOLIC PANEL
Anion gap: 11 (ref 5–15)
BUN: 30 mg/dL — ABNORMAL HIGH (ref 8–23)
CO2: 27 mmol/L (ref 22–32)
Calcium: 9.9 mg/dL (ref 8.9–10.3)
Chloride: 101 mmol/L (ref 98–111)
Creatinine, Ser: 0.82 mg/dL (ref 0.44–1.00)
GFR, Estimated: >60 mL/min (ref >60)
Glucose, Bld: 210 mg/dL — ABNORMAL HIGH (ref 70–99)
Potassium: 3.7 mmol/L (ref 3.5–5.1)
Sodium: 139 mmol/L (ref 135–145)

## 2023-08-14 LAB — URINE CULTURE

## 2023-08-14 LAB — MAGNESIUM: Magnesium: 1.7 mg/dL (ref 1.7–2.4)

## 2023-08-14 MED ORDER — MAGNESIUM SULFATE 2 GM/50ML IV SOLN
2.0000 g | Freq: Once | INTRAVENOUS | Status: AC
  Administered 2023-08-14: 2 g via INTRAVENOUS
  Filled 2023-08-14: qty 50

## 2023-08-14 MED ORDER — QUETIAPINE FUMARATE 25 MG PO TABS
50.0000 mg | ORAL_TABLET | ORAL | Status: DC
  Administered 2023-08-15 – 2023-08-16 (×2): 50 mg via ORAL
  Filled 2023-08-14 (×2): qty 2

## 2023-08-14 MED ORDER — SODIUM CHLORIDE 0.9 % IV SOLN: INTRAVENOUS | Status: AC

## 2023-08-14 MED ORDER — FLEET ENEMA RE ENEM: 1.0000 | ENEMA | Freq: Every day | RECTAL | Status: DC | PRN

## 2023-08-14 MED ORDER — ENOXAPARIN SODIUM 40 MG/0.4ML IJ SOSY
40.0000 mg | PREFILLED_SYRINGE | INTRAMUSCULAR | Status: DC
  Administered 2023-08-14 – 2023-08-15 (×2): 40 mg via SUBCUTANEOUS
  Filled 2023-08-14 (×2): qty 0.4

## 2023-08-14 MED ORDER — ENSURE ENLIVE PO LIQD
237.0000 mL | Freq: Three times a day (TID) | ORAL | Status: DC
  Administered 2023-08-14 – 2023-08-16 (×7): 237 mL via ORAL

## 2023-08-14 MED ORDER — QUETIAPINE FUMARATE 25 MG PO TABS
50.0000 mg | ORAL_TABLET | Freq: Every day | ORAL | Status: DC
  Administered 2023-08-14 – 2023-08-16 (×3): 50 mg via ORAL
  Filled 2023-08-14 (×2): qty 2

## 2023-08-14 MED ORDER — QUETIAPINE FUMARATE 25 MG PO TABS
100.0000 mg | ORAL_TABLET | Freq: Every day | ORAL | Status: DC
  Administered 2023-08-15: 100 mg via ORAL
  Filled 2023-08-14: qty 4

## 2023-08-14 NOTE — NC FL2 (Signed)
[place] MEDICAID FL2 LEVEL OF CARE FORM     IDENTIFICATION  Patient Name: Lindsey [name] Birthdate: 29-Jul-1951 Sex: female Admission Date (Current Location): 08/05/2023  [place] and [place] Number:  [address] [id number] Facility and Address:  [facility], [address] [place], [place] [address]      Provider Number: [id number]  Attending Physician Name and Address:  Alba Cory, MD  Relative Name and Phone Number:  Troy Sine [phone]    Current Level of Care: Hospital Recommended Level of Care: Skilled Nursing Facility Prior Approval Number:    Date Approved/Denied:   PASRR Number: [id number]  Discharge Plan: SNF    Current Diagnoses: Patient Active Problem List   Diagnosis Date Noted   Fall 08/05/2023   Dementia due to medical condition without behavioral disturbance (HCC) 01/04/2023   RSV infection 11/15/2022   Mild protein-calorie malnutrition (HCC) 05/08/2022   Stage 3a chronic kidney disease (HCC) 05/08/2022   Type 2 diabetes mellitus with microalbuminuria, without long-term current use of insulin (HCC) 05/08/2022   Pain due to onychomycosis of toenails of both feet 05/04/2019   Dementia with behavioral disturbance (HCC) 01/09/2016   Mild dementia (HCC) 11/01/2015   Auditory hallucination 07/26/2015   Anemia of chronic disease 07/05/2015   Cataract 04/06/2015   Type 2 diabetes mellitus with diabetic cataract (HCC) 04/06/2015   Benign essential HTN 04/02/2015   Controlled gout 04/02/2015   Dyslipidemia 04/02/2015   Fibromyalgia syndrome 04/02/2015   Chronic insomnia 04/02/2015   Cognitive decline 04/02/2015   Nodular basal cell carcinoma 04/02/2015   Osteopenia 04/02/2015   Calcium blood increased 04/02/2015    Orientation RESPIRATION BLADDER Height & Weight     Self  Normal Incontinent Weight: 145 lb (65.8 kg) Height:  4\' 11"  (149.9 cm)  BEHAVIORAL SYMPTOMS/MOOD NEUROLOGICAL BOWEL NUTRITION STATUS       Incontinent  (See Discharge Summary)  AMBULATORY STATUS COMMUNICATION OF NEEDS Skin   Extensive Assist Verbally Normal                       Personal Care Assistance Level of Assistance  Bathing, Feeding, Dressing Bathing Assistance: Limited assistance Feeding assistance: Limited assistance Dressing Assistance: Limited assistance     Functional Limitations Info  Sight, Hearing, Speech Sight Info: Impaired Hearing Info: Adequate Speech Info: Adequate    SPECIAL CARE FACTORS FREQUENCY  PT (By licensed PT), OT (By licensed OT)     PT Frequency: 5x weekly OT Frequency: 5x weekly            Contractures Contractures Info: Not present    Additional Factors Info  Code Status, Allergies Code Status Info: Full Code Allergies Info: Ace Inhibitors           Current Medications (08/14/2023):  This is the current hospital active medication list Current Facility-Administered Medications  Medication Dose Route Frequency Provider Last Rate Last Admin   0.9 %  sodium chloride infusion   Intravenous Continuous Regalado, Belkys A, MD 100 mL/hr at 08/14/23 0915 New Bag at 08/14/23 0915   acetaminophen (TYLENOL) tablet 650 mg  650 mg Oral Q6H PRN Lolita Patella B, MD       Or   acetaminophen (TYLENOL) suppository 650 mg  650 mg Rectal Q6H PRN Sreenath, Sudheer B, MD       albuterol (PROVENTIL) (2.5 MG/3ML) 0.083% nebulizer solution 2.5 mg  2.5 mg Nebulization Q2H PRN Georgeann Oppenheim, Jonelle Sports, MD       allopurinol (ZYLOPRIM)  tablet 100 mg  100 mg Oral BID Lolita Patella B, MD   100 mg at 08/14/23 0902   carvedilol (COREG) tablet 6.25 mg  6.25 mg Oral BID WC Sreenath, Sudheer B, MD   6.25 mg at 08/14/23 1610   cyanocobalamin (VITAMIN B12) tablet 1,000 mcg  1,000 mcg Oral Daily Georgeann Oppenheim, Sudheer B, MD   1,000 mcg at 08/14/23 0902   donepezil (ARICEPT) tablet 10 mg  10 mg Oral Daily Lolita Patella B, MD   10 mg at 08/14/23 0902   feeding supplement (ENSURE ENLIVE / ENSURE PLUS)  liquid 237 mL  237 mL Oral BID BM Kathrynn Running, MD   237 mL at 08/14/23 9604   icosapent Ethyl (VASCEPA) 1 g capsule 2 g  2 g Oral BID Lolita Patella B, MD   2 g at 08/14/23 0902   insulin aspart (novoLOG) injection 0-15 Units  0-15 Units Subcutaneous TID WC Lolita Patella B, MD   5 Units at 08/14/23 0901   insulin aspart (novoLOG) injection 0-5 Units  0-5 Units Subcutaneous QHS Lolita Patella B, MD   2 Units at 08/12/23 2144   memantine (NAMENDA) tablet 10 mg  10 mg Oral BID Lolita Patella B, MD   10 mg at 08/14/23 5409   multivitamin with minerals tablet 1 tablet  1 tablet Oral Q lunch Wouk, Wilfred Curtis, MD   1 tablet at 08/14/23 0902   ondansetron (ZOFRAN) tablet 4 mg  4 mg Oral Q6H PRN Lolita Patella B, MD       Or   ondansetron (ZOFRAN) injection 4 mg  4 mg Intravenous Q6H PRN Sreenath, Sudheer B, MD       QUEtiapine (SEROQUEL) tablet 100 mg  100 mg Oral TID Lolita Patella B, MD   100 mg at 08/14/23 0902   rosuvastatin (CRESTOR) tablet 20 mg  20 mg Oral Daily Sreenath, Sudheer B, MD   20 mg at 08/14/23 0902   sertraline (ZOLOFT) tablet 50 mg  50 mg Oral Daily Lolita Patella B, MD   50 mg at 08/14/23 0902     Discharge Medications: Please see discharge summary for a list of discharge medications.  Relevant Imaging Results:  Relevant Lab Results:   Additional Information [id number]  Allena Katz, LCSW

## 2023-08-14 NOTE — Plan of Care (Signed)
  Problem: Education: Goal: Ability to describe self-care measures that may prevent or decrease complications (Diabetes Survival Skills Education) will improve Outcome: Progressing   Problem: Coping: Goal: Ability to adjust to condition or change in health will improve 08/14/2023 0750 by Jorge Ny, RN Outcome: Progressing 08/14/2023 0750 by Jorge Ny, RN Outcome: Progressing   Problem: Fluid Volume: Goal: Ability to maintain a balanced intake and output will improve 08/14/2023 0750 by Jorge Ny, RN Outcome: Progressing 08/14/2023 0750 by Jorge Ny, RN Outcome: Progressing   Problem: Metabolic: Goal: Ability to maintain appropriate glucose levels will improve 08/14/2023 0750 by Jorge Ny, RN Outcome: Progressing 08/14/2023 0750 by Jorge Ny, RN Outcome: Progressing   Problem: Nutritional: Goal: Maintenance of adequate nutrition will improve 08/14/2023 0750 by Jorge Ny, RN Outcome: Progressing 08/14/2023 0750 by Jorge Ny, RN Outcome: Progressing Goal: Progress toward achieving an optimal weight will improve Outcome: Progressing   Problem: Skin Integrity: Goal: Risk for impaired skin integrity will decrease Outcome: Progressing   Problem: Tissue Perfusion: Goal: Adequacy of tissue perfusion will improve Outcome: Progressing   Problem: Clinical Measurements: Goal: Respiratory complications will improve Outcome: Progressing Goal: Cardiovascular complication will be avoided 08/14/2023 0750 by Jorge Ny, RN Outcome: Progressing 08/14/2023 0750 by Jorge Ny, RN Outcome: Progressing   Problem: Activity: Goal: Risk for activity intolerance will decrease 08/14/2023 0750 by Jorge Ny, RN Outcome: Progressing 08/14/2023 0750 by Jorge Ny, RN Outcome: Progressing

## 2023-08-14 NOTE — Plan of Care (Signed)
  Problem: Coping: Goal: Ability to adjust to condition or change in health will improve Outcome: Progressing   Problem: Fluid Volume: Goal: Ability to maintain a balanced intake and output will improve Outcome: Progressing   Problem: Metabolic: Goal: Ability to maintain appropriate glucose levels will improve Outcome: Progressing   Problem: Nutritional: Goal: Maintenance of adequate nutrition will improve Outcome: Progressing Goal: Progress toward achieving an optimal weight will improve Outcome: Progressing   Problem: Skin Integrity: Goal: Risk for impaired skin integrity will decrease Outcome: Progressing   Problem: Clinical Measurements: Goal: Respiratory complications will improve Outcome: Progressing Goal: Cardiovascular complication will be avoided Outcome: Progressing   Problem: Activity: Goal: Risk for activity intolerance will decrease Outcome: Progressing

## 2023-08-14 NOTE — TOC Progression Note (Signed)
Transition of Care [facility]) - Progression Note    Patient Details  Name: Lindsey [name] MRN: [id number] Date of Birth: [birth year]  [facility]) CM/SW Contact  Allena Katz, LCSW Phone Number: 08/14/2023, 12:24 PM  Clinical Narrative:   CSW left voicemail with daughter to discuss bed offers. MD reports medically ready to start auth.         Expected Discharge Plan and Services                                               Social Determinants of Health (SDOH) Interventions SDOH Screenings   Food Insecurity: No Food Insecurity (08/06/2023)  Housing: Low Risk  (08/06/2023)  Transportation Needs: No Transportation Needs (08/06/2023)  Utilities: Not At Risk (08/06/2023)  Alcohol Screen: Low Risk  (08/22/2022)  Depression (PHQ2-9): Low Risk  (05/30/2023)  Financial Resource Strain: Low Risk  (01/02/2023)  Physical Activity: Unknown (01/02/2023)  Social Connections: Socially Isolated (01/02/2023)  Stress: No Stress Concern Present (01/02/2023)  Tobacco Use: Low Risk  (08/06/2023)  Recent Concern: Tobacco Use - Medium Risk (07/10/2023)   Received from [facility]    Readmission Risk Interventions     No data to display

## 2023-08-14 NOTE — Progress Notes (Addendum)
PROGRESS NOTE    Lindsey [name]  EXB:[id number] DOB: 01-Jan-1951 DOA: 08/05/2023 PCP: Alba Cory, MD   Brief Narrative: 72 year old with past medical history significant for dementia with behavioral disturbance, diabetes type 2, hypertension, hyperlipidemia, depression, osteopenia who lives alone who was brought in for weakness and fall.  Patient unable to provide accurate history.  History obtained from chart review.  Patient lives independently and does all her activities of daily living with family support close by.  Family noted that she has been increasingly weak and confused and had [name] witnessed fall.  Unclear if this was [name] syncopal episode or now.  Patient was admitted for further evaluation.   Assessment & Plan:   Principal Problem:   Fall Active Problems:   Benign essential HTN   Dementia with behavioral disturbance (HCC)   Type 2 diabetes mellitus with microalbuminuria, without long-term current use of insulin (HCC)   1-Ambulatory dysfunction Patient presented with progressive weakness, trouble getting out of the bed she slid out of the chair at home as well. MRI brain negative.  MRI of the lumbar spine no signs of canal stenosis or cord compression. PT recommended SNF awaiting insurance approval  Poor oral intake: Tachycardia: Start IV fluids for 24 hours  Acute urinary retention Developed urinary retention 10/23 required in and out cath for 1 day.  She has been urinating spontaneously  Chronic L1 and L3 compression fractures Pain management.  Follow-up with PT  Dementia with behavioral disturbance and Alzheimer Delirium  She has been having some hospital delirium. Continue sertraline, donepezil, Namenda. I have reduced Seroquel to 50 mg during the days and continue with 100 mg at night Palliative care consulted.  Poor oral intake.   DM type 2;  Continue with SSI.   Hyperlipidemia; Continue with [name] statin  Essential hypertension Continue with Coreg.    History of gout Continue with allopurinol  History of B12 deficiency Continue with B12 supplements Pyuria: Repeat urine culture  Hypomagnesemia; replete IV>  Nutrition Problem: Increased nutrient needs Etiology: chronic illness (dementia)    Signs/Symptoms: estimated needs    Interventions: Ensure Enlive (each supplement provides 350kcal and 20 grams of protein), MVI  Estimated body mass index is 29.29 kg/m as calculated from the following:   Height as of this encounter: 4\' 11"  (1.499 m).   Weight as of this encounter: 65.8 kg.   DVT prophylaxis: Start Lovenox Code Status: Full code Family Communication: No family at bedside Disposition Plan:  Status is: Inpatient Remains inpatient appropriate because: Awaiting SNF    Consultants:  Palliative  Procedures:    Antimicrobials:    Subjective: She keeps eyes close. Would answer few questions.   Objective: Vitals:   08/13/23 1942 08/13/23 2130 08/14/23 0130 08/14/23 0649  BP: (!) 113/51 105/67 117/71 114/64  Pulse: (!) 118 (!) 117 (!) 114 (!) 110  Resp: 18 18 17 17   Temp: 98.7 F (37.1 C) 98.2 F (36.8 C) 98.8 F (37.1 C) 98.3 F (36.8 C)  TempSrc:      SpO2: 94% 98% 96% 95%  Weight:      Height:        Intake/Output Summary (Last 24 hours) at 08/14/2023 0739 Last data filed at 08/13/2023 1430 Gross per 24 hour  Intake 120 ml  Output --  Net 120 ml   Filed Weights   08/05/23 0943  Weight: 65.8 kg    Examination:  General exam: Appears calm and comfortable  Respiratory system: Clear to auscultation. Respiratory effort  normal. Cardiovascular system: S1 & S2 heard, RRR. No JVD, murmurs, rubs, gallops or clicks. No pedal edema. Gastrointestinal system: Abdomen is nondistended, soft and nontender. No organomegaly or masses felt. Normal bowel sounds heard. Central nervous system:sleepy  Extremities: no edema  Data Reviewed: I have personally reviewed following labs and imaging  studies  CBC: Recent Labs  Lab 08/09/23 0905 08/10/23 0529 08/11/23 0605 08/12/23 0440  WBC 8.1 8.9 9.0 10.0  HGB 11.5* 11.3* 11.1* 11.1*  HCT 34.0* 34.4* 33.3* 32.7*  MCV 84.8 86.0 86.3 85.2  PLT 215 232 242 246   Basic Metabolic Panel: Recent Labs  Lab 08/08/23 0645 08/09/23 0905 08/10/23 0529 08/11/23 0605 08/12/23 0440  NA 139 139 141 138 138  K 3.4* 3.6 3.7 3.6 3.6  CL 105 104 103 102 100  CO2 22 25 24 24 24   GLUCOSE 122* 137* 140* 179* 199*  BUN 20 20 21  27* 28*  CREATININE 0.93 0.88 0.95 0.93 0.86  CALCIUM 9.8 10.1 10.2 10.0 10.3  MG  --  1.7 1.7 1.7 1.8  PHOS  --  3.4 3.5 2.8 2.6   GFR: Estimated Creatinine Clearance: 48 mL/min (by C-G formula based on SCr of 0.86 mg/dL). Liver Function Tests: No results for input(s): "AST", "ALT", "ALKPHOS", "BILITOT", "PROT", "ALBUMIN" in the last 168 hours. No results for input(s): "LIPASE", "AMYLASE" in the last 168 hours. No results for input(s): "AMMONIA" in the last 168 hours. Coagulation Profile: No results for input(s): "INR", "PROTIME" in the last 168 hours. Cardiac Enzymes: No results for input(s): "CKTOTAL", "CKMB", "CKMBINDEX", "TROPONINI" in the last 168 hours. BNP (last 3 results) No results for input(s): "PROBNP" in the last 8760 hours. HbA1C: No results for input(s): "HGBA1C" in the last 72 hours. CBG: Recent Labs  Lab 08/13/23 1127 08/13/23 1538 08/13/23 1842 08/14/23 0008 08/14/23 0016  GLUCAP 221* 341* 317* 182* 181*   Lipid Profile: No results for input(s): "CHOL", "HDL", "LDLCALC", "TRIG", "CHOLHDL", "LDLDIRECT" in the last 72 hours. Thyroid Function Tests: No results for input(s): "TSH", "T4TOTAL", "FREET4", "T3FREE", "THYROIDAB" in the last 72 hours. Anemia Panel: No results for input(s): "VITAMINB12", "FOLATE", "FERRITIN", "TIBC", "IRON", "RETICCTPCT" in the last 72 hours. Sepsis Labs: No results for input(s): "PROCALCITON", "LATICACIDVEN" in the last 168 hours.  Recent Results (from  the past 240 hour(s))  Blood culture (routine x 2)     Status: None   Collection Time: 08/05/23 11:46 AM   Specimen: BLOOD RIGHT ARM  Result Value Ref Range Status   Specimen Description BLOOD RIGHT ARM  Final   Special Requests   Final    BOTTLES DRAWN AEROBIC AND ANAEROBIC Blood Culture results may not be optimal due to an inadequate volume of blood received in culture bottles   Culture   Final    NO GROWTH 5 DAYS Performed at [facility], [address] [place], [place] [address]    Report Status 08/10/2023 FINAL  Final  Blood culture (routine x 2)     Status: None   Collection Time: 08/05/23 11:46 AM   Specimen: BLOOD LEFT HAND  Result Value Ref Range Status   Specimen Description BLOOD LEFT HAND  Final   Special Requests   Final    BOTTLES DRAWN AEROBIC AND ANAEROBIC Blood Culture results may not be optimal due to an inadequate volume of blood received in culture bottles   Culture   Final    NO GROWTH 5 DAYS Performed at [facility], [address]  [place], [place] [address]    Report Status 08/10/2023 FINAL  Final  Resp panel by RT-PCR (RSV, Flu [name]&B, Covid) Anterior Nasal Swab     Status: None   Collection Time: 08/05/23 11:47 AM   Specimen: Anterior Nasal Swab  Result Value Ref Range Status   SARS Coronavirus 2 by RT PCR NEGATIVE NEGATIVE Final    Comment: (NOTE) SARS-CoV-2 target nucleic acids are NOT DETECTED.  The SARS-CoV-2 RNA is generally detectable in upper respiratory specimens during the acute phase of infection. The lowest concentration of SARS-CoV-2 viral copies this assay can detect is 138 copies/mL. [name] negative result does not preclude SARS-Cov-2 infection and should not be used as the sole basis for treatment or other patient management decisions. [name] negative result may occur with  improper specimen collection/handling, submission of specimen other than nasopharyngeal swab, presence of viral mutation(s) within the areas targeted by  this assay, and inadequate number of viral copies(<138 copies/mL). [name] negative result must be combined with clinical observations, patient history, and epidemiological information. The expected result is Negative.  Fact Sheet for Patients:  BloggerCourse.com  Fact Sheet for Healthcare Providers:  SeriousBroker.it  This test is no t yet approved or cleared by the [place] FDA and  has been authorized for detection and/or diagnosis of SARS-CoV-2 by FDA under an Emergency Use Authorization (EUA). This EUA will remain  in effect (meaning this test can be used) for the duration of the COVID-19 declaration under Section 564(b)(1) of the Act, 21 U.S.C.section 360bbb-3(b)(1), unless the authorization is terminated  or revoked sooner.       Influenza [name] by PCR NEGATIVE NEGATIVE Final   Influenza B by PCR NEGATIVE NEGATIVE Final    Comment: (NOTE) The Xpert Xpress SARS-CoV-2/FLU/RSV plus assay is intended as an aid in the diagnosis of influenza from Nasopharyngeal swab specimens and should not be used as [name] sole basis for treatment. Nasal washings and aspirates are unacceptable for Xpert Xpress SARS-CoV-2/FLU/RSV testing.  Fact Sheet for Patients: BloggerCourse.com  Fact Sheet for Healthcare Providers: SeriousBroker.it  This test is not yet approved or cleared by the [place] FDA and has been authorized for detection and/or diagnosis of SARS-CoV-2 by FDA under an Emergency Use Authorization (EUA). This EUA will remain in effect (meaning this test can be used) for the duration of the COVID-19 declaration under Section 564(b)(1) of the Act, 21 U.S.C. section 360bbb-3(b)(1), unless the authorization is terminated or revoked.     Resp Syncytial Virus by PCR NEGATIVE NEGATIVE Final    Comment: (NOTE) Fact Sheet for Patients: BloggerCourse.com  Fact Sheet  for Healthcare Providers: SeriousBroker.it  This test is not yet approved or cleared by the [place] FDA and has been authorized for detection and/or diagnosis of SARS-CoV-2 by FDA under an Emergency Use Authorization (EUA). This EUA will remain in effect (meaning this test can be used) for the duration of the COVID-19 declaration under Section 564(b)(1) of the Act, 21 U.S.C. section 360bbb-3(b)(1), unless the authorization is terminated or revoked.  Performed at [facility], [address] [place], [place] [address]          Radiology Studies: No results found.      Scheduled Meds:  allopurinol  100 mg Oral BID   carvedilol  6.25 mg Oral BID WC   cyanocobalamin  1,000 mcg Oral Daily   donepezil  10 mg Oral Daily   feeding supplement  237 mL Oral BID BM   icosapent Ethyl  2 g  Oral BID   insulin aspart  0-15 Units Subcutaneous TID WC   insulin aspart  0-5 Units Subcutaneous QHS   losartan  25 mg Oral Daily   memantine  10 mg Oral BID   multivitamin with minerals  1 tablet Oral Q lunch   QUEtiapine  100 mg Oral TID   rosuvastatin  20 mg Oral Daily   sertraline  50 mg Oral Daily   Continuous Infusions:   LOS: 8 days    Time spent: 35 minutes    Lindsey [name] Kalena Mander, MD [facility]   If 7PM-7AM, please contact night-coverage [email or web address]  08/14/2023, 7:39 AM

## 2023-08-14 NOTE — TOC Progression Note (Signed)
Transition of Care [facility]) - Progression Note    Patient Details  Name: Lindsey [name] MRN: [id number] Date of Birth: 1951-10-13  Transition of [facility]) CM/SW Contact  Allena Katz, LCSW Phone Number: 08/14/2023, 12:31 PM  Clinical Narrative:   CSW spoke with Daughter who would like patient to go to [facility].  CSW to start auth.          Expected Discharge Plan and Services                                               Social Determinants of Health (SDOH) Interventions SDOH Screenings   Food Insecurity: No Food Insecurity (08/06/2023)  Housing: Low Risk  (08/06/2023)  Transportation Needs: No Transportation Needs (08/06/2023)  Utilities: Not At Risk (08/06/2023)  Alcohol Screen: Low Risk  (08/22/2022)  Depression (PHQ2-9): Low Risk  (05/30/2023)  Financial Resource Strain: Low Risk  (01/02/2023)  Physical Activity: Unknown (01/02/2023)  Social Connections: Socially Isolated (01/02/2023)  Stress: No Stress Concern Present (01/02/2023)  Tobacco Use: Low Risk  (08/06/2023)  Recent Concern: Tobacco Use - Medium Risk (07/10/2023)   Received from [facility]    Readmission Risk Interventions     No data to display

## 2023-08-14 NOTE — Progress Notes (Signed)
Occupational Therapy Treatment Patient Details Name: Lindsey [name] MRN: [id number] DOB: 04/24/1951 Today's Date: 08/14/2023   History of present illness Lindsey [name] is a 72 y.o. female with medical history significant of dementia with behavioral disturbance, type 2 diabetes, hypertension, hyperlipidemia, depression, osteopenia who lives alone who was brought in for weakness and falls. Patient unable to provide accurate history.  History obtained by speaking with the ER physician and reviewing the medical record.  Patient lives independently and does all of her activities of daily living with family support close by.  Family noted her to be increasingly weak and confused and patient had a witnessed fall.  Unclear if this was syncopal event or not.   OT comments  Chart reviewed prior to tx session. Pt seen for OT treatment on this date. Upon arrival to room pt asleep in bed, consistent verbal/tactile stimuli to wake. Tx session targeted improved ADL activity tolerance. Pt was alert once awake, oriented x1 required 2 attempts to recite first name. Pt requires MAXA + 2 for STS at EOB, 2 attempts STS with RW, second attempt Pt unable to clear bed. Pt able to comb hair at EOB with set up + cues to initiate task, MOD A to complete. TOTAL + 2 assist for bed mobility on this date. Vss throughout. Pt notable decline in Pt progress toward goals, will reassess goals in future session. Discharge recommendation remains appropriate. OT will follow acutely.           If plan is discharge home, recommend the following:  Two people to help with walking and/or transfers;Two people to help with bathing/dressing/bathroom;Assistance with cooking/housework;Assistance with feeding;Direct supervision/assist for medications management;Direct supervision/assist for financial management;Help with stairs or ramp for entrance;Assist for transportation;Supervision due to cognitive status   Equipment Recommendations  Other  (comment)    Recommendations for Other Services      Precautions / Restrictions Precautions Precautions: Fall Restrictions Weight Bearing Restrictions: No       Mobility Bed Mobility Overal bed mobility: Needs Assistance Bed Mobility: Supine to Sit, Sit to Supine     Supine to sit: Total, +2 for physical assistance Sit to supine: Total, +2 for physical assistance   General bed mobility comments: Pt confused and hesitant when returning to bed, able to consoule    Transfers Overall transfer level: Needs assistance Equipment used: Rolling walker (2 wheels) Transfers: Sit to/from Stand Sit to Stand: Max assist, +2 physical assistance           General transfer comment: 2 attempts STS with RW at EOB, MAX +2. Second attempt pt unable to clear bed.     Balance Overall balance assessment: Needs assistance Sitting-balance support: Bilateral upper extremity supported, Feet supported Sitting balance-Leahy Scale: Poor     Standing balance support: Bilateral upper extremity supported, Reliant on assistive device for balance Standing balance-Leahy Scale: Zero                             ADL either performed or assessed with clinical judgement   ADL Overall ADL's : Needs assistance/impaired     Grooming: Wash/dry face;Bed level;Maximal assistance;Brushing hair;Moderate assistance Grooming Details (indicate cue type and reason): Attempted to comb hair, unable to reach aside from front of few pieces             Lower Body Dressing: Total assistance;Bed level Lower Body Dressing Details (indicate cue type and reason): Donning socks  Extremity/Trunk Assessment              Vision       Perception     Praxis      Cognition Arousal: Alert (lethargic at start of session, awaken with consistant tactlile and verbal stimuli) Behavior During Therapy: Flat affect Overall Cognitive Status: History of cognitive impairments - at  baseline Area of Impairment: Orientation, Safety/judgement, Awareness, Following commands, Memory, Problem solving                 Orientation Level: Disoriented to, Place, Time, Situation   Memory: Decreased short-term memory Following Commands: Follows one step commands inconsistently Safety/Judgement: Decreased awareness of safety, Decreased awareness of deficits Awareness: Intellectual Problem Solving: Decreased initiation, Difficulty sequencing, Requires verbal cues, Requires tactile cues, Slow processing          Exercises      Shoulder Instructions       General Comments      Pertinent Vitals/ Pain       Pain Assessment Pain Assessment: PAINAD Breathing: normal Negative Vocalization: occasional moan/groan, low speech, negative/disapproving quality Facial Expression: smiling or inexpressive Body Language: relaxed Consolability: no need to console PAINAD Score: 1 Pain Intervention(s): Limited activity within patient's tolerance, Monitored during session, Repositioned  Home Living                                          Prior Functioning/Environment              Frequency  Min 1X/week        Progress Toward Goals  OT Goals(current goals can now be found in the care plan section)  Progress towards OT goals: Not progressing toward goals - comment (will continue to assess)     Plan      Co-evaluation                 AM-PAC OT "6 Clicks" Daily Activity     Outcome Measure   Help from another person eating meals?: A Lot Help from another person taking care of personal grooming?: Total Help from another person toileting, which includes using toliet, bedpan, or urinal?: Total Help from another person bathing (including washing, rinsing, drying)?: Total Help from another person to put on and taking off regular upper body clothing?: Total Help from another person to put on and taking off regular lower body clothing?:  Total 6 Click Score: 7    End of Session Equipment Utilized During Treatment: Gait belt;Rolling walker (2 wheels)  OT Visit Diagnosis: Unsteadiness on feet (R26.81);Other symptoms and signs involving cognitive function;Other abnormalities of gait and mobility (R26.89)   Activity Tolerance Patient limited by fatigue;Other (comment)   Patient Left in bed;with call bell/phone within reach;with bed alarm set   Nurse Communication          Time: [phone] OT Time Calculation (min): 21 min  Charges: OT General Charges $OT Visit: 1 Visit OT Treatments $Therapeutic Activity: 8-22 mins  Glenard Haring, OTS

## 2023-08-14 NOTE — Progress Notes (Signed)
Nutrition Follow-up  DOCUMENTATION CODES:   Not applicable  INTERVENTION:   -Continue dysphagia 2 diet -Continue MVI with minerals daily -Increase Ensure Enlive po TID, each supplement provides 350 kcal and 20 grams of protein -Magic cup TID with meals, each supplement provides 290 kcal and 9 grams of protein  -Feeding assistance with meals   NUTRITION DIAGNOSIS:   Increased nutrient needs related to chronic illness (dementia) as evidenced by estimated needs.  Ongoing  GOAL:   Patient will meet greater than or equal to 90% of their needs  Progressing   MONITOR:   PO intake, Supplement acceptance  REASON FOR ASSESSMENT:   Consult Assessment of nutrition requirement/status  ASSESSMENT:   Pt with medical history significant of dementia with behavioral disturbance, type 2 diabetes, hypertension, hyperlipidemia, depression, osteopenia who lives alone who was brought in for weakness and falls.  Reviewed I/O's: +120 ml x 24 hours and -3.4 L since admission  UOP: 300 ml x 24 hours   Pt sitting up in bed at time of visit. Pt alert, however, unable to answer questions appropriate. Pt replied "that sounds good" to all questions asked. Meal tray at bedside untouched.   Case discussed with RN, who reports mentation hs improved over the past 3 days. Pt with difficulty taking pills and PO's. SLP eval has been ordered as pt often holds foods and liquids in her mouth. Case discussed with SLP; plan to downgrade to a dysphagia 2 diet for ease of intake.   Noted meal completions 0-20%. Pt is taking Ensure supplements well. RN reports pt may also benefit from [employer]. RD to order.   No new wt since admission.   Medications reviewed and include vitamin B-12.   Palliative care following for goals of care discussions. Per TOC notes, plan for SNF at discharge.   Labs reviewed: CBGS: 181-341 (inpatient orders for glycemic control are 0-15 units insulin aspart TID with meals and 0-5  units insulin aspart daily at bedtime). Per DM coordinator notes, recommending meal coverage at meals if consuming at least 50%.   Diet Order:   Diet Order             DIET DYS 2 Room service appropriate? Yes with Assist; Fluid consistency: Thin  Diet effective now                   EDUCATION NEEDS:   Education needs have been addressed  Skin:  Skin Assessment: Reviewed RN Assessment  Last BM:  08/08/23  Height:   Ht Readings from Last 1 Encounters:  08/05/23 4\' 11"  (1.499 m)    Weight:   Wt Readings from Last 1 Encounters:  08/05/23 65.8 kg    Ideal Body Weight:  44.5 kg  BMI:  Body mass index is 29.29 kg/m.  Estimated Nutritional Needs:   Kcal:  1300-1500  Protein:  55-70 grams  Fluid:  > 1.3 L    Levada Schilling, RD, LDN, CDCES Registered Dietitian III Certified Diabetes Care and Education Specialist Please refer to [facility] for RD and/or RD on-call/weekend/after hours pager

## 2023-08-14 NOTE — Progress Notes (Signed)
Palliative Care Progress Note, Assessment & Plan   Patient Name: Lindsey [name]       Date: 08/14/2023 DOB: 1951-03-12  Age: 72 y.o. MRN#: [id number] Attending Physician: Alba Cory, MD Primary Care Physician: Alba Cory, MD Admit Date: 08/05/2023  Subjective: Patient is lying in bed in no apparent distress.  IVF and electrolyte replacements are running through her IV.  She is sleeping, awakens easily, and returns back to sleep.  Her RN is at bedside during my visit.  No family or friends present during my visit.  HPI: 72 y.o. female  with past medical history of dementia, type 2 diabetes, HTN, HLD, depression, osteopenia, B12 deficiency, and gout admitted on 08/05/2023 with weakness and falls.    Patient is being treated for ambulatory dysfunction, acute urinary retention, L1 and L3 chronic compression fractures, and dementia with behavioral disturbances.   MRI of brain revealed no acute intracranial abnormalities.   PMT was consulted to discuss goals of care.  Summary of counseling/coordination of care: Extensive chart review completed prior to meeting patient including labs, vital signs, imaging, progress notes, orders, and available advanced directive documents from current and previous encounters.   After reviewing the patient's chart and assessing the patient at bedside, I spoke with dayshift RN in regards to patient's symptom management.  He endorses that she has no complaints of pain but that she is also not able to fully communicate her needs.  Nonverbal signs of discomfort or pain not observed.  No adjustment to [facility] needed.  After meeting with the patient, I attempted to speak with her son Berna Spare over the phone.  No answer.  HIPAA compliant voicemail left.  I conveyed that a MOST  form is left at bedside for his review. I conveyed that the purpose of the MOST form is to further outline patient's goals and wishes as plans are in the works for her to transfer an different medical facility.   PMT will continue to follow and support patient and family throughout her hospitalization.  Physical Exam Vitals reviewed.  Constitutional:      General: She is not in acute distress.    Appearance: She is normal weight. She is not ill-appearing.  HENT:     Mouth/Throat:     Mouth: Mucous membranes are moist.  Pulmonary:     Effort: Pulmonary effort is normal.  Abdominal:     Palpations: Abdomen is soft.  Musculoskeletal:     Comments: Generalized weakness  Skin:    General: Skin is warm and dry.     Coloration: Skin is pale.             Total Time 25 minutes   Time spent includes: Detailed review of medical records (labs, imaging, vital signs), medically appropriate exam (mental status, respiratory, cardiac, skin), discussed with treatment team, counseling and educating patient, family and staff, documenting clinical information, medication management and coordination of care.  Samara Deist L. Bonita Quin, DNP, FNP-BC Palliative Medicine Team

## 2023-08-14 NOTE — Inpatient Diabetes Management (Signed)
Inpatient Diabetes Program Recommendations  AACE/ADA: New Consensus Statement on Inpatient Glycemic Control (2015)  Target Ranges:  Prepandial:   less than 140 mg/dL      Peak postprandial:   less than 180 mg/dL (1-2 hours)      Critically ill patients:  140 - 180 mg/dL   Lab Results  Component Value Date   GLUCAP 206 (H) 08/14/2023   HGBA1C 7.2 (A) 05/06/2023    Review of Glycemic Control  Latest Reference Range & Units 08/13/23 08:24 08/13/23 11:27 08/13/23 15:38 08/13/23 18:42 08/14/23 00:08 08/14/23 00:16 08/14/23 08:38  Glucose-Capillary 70 - 99 mg/dL 161 (H) 096 (H) 045 (H) 317 (H) 182 (H) 181 (H) 206 (H)  (H): Data is abnormally high  Diabetes history: DM2 Outpatient Diabetes medications: Januvia 50 mg daily,  Jardiance 25 mg daily, Metformin 1000 BID  Current orders for Inpatient glycemic control: Novolog 0-15 units TID and 0-5 units QHS  Inpatient Diabetes Program Recommendations:    Postprandials are elevated.  Might consider:  Novolog 2-3 units TID with meals if she consumes at least 50%.    Will continue to follow while inpatient.  Thank you, Dulce Sellar, MSN, CDCES Diabetes Coordinator Inpatient Diabetes Program [phone] (team pager from 8a-5p)

## 2023-08-14 NOTE — Evaluation (Signed)
Clinical/Bedside Swallow Evaluation Patient Details  Name: Lindsey [name] MRN: [id number] Date of Birth: 1951-10-01  Today's Date: 08/14/2023 Time: SLP Start Time (ACUTE ONLY): 1045 SLP Stop Time (ACUTE ONLY): 1140 SLP Time Calculation (min) (ACUTE ONLY): 55 min  Past Medical History:  Past Medical History:  Diagnosis Date   Acute gout    Allergy ace inhibitors   Anemia of chronic disease    Anxiety    Basal cell carcinoma    Cataract    Depression, major, recurrent, mild (HCC)    Diabetes mellitus without complication (HCC)    Fibromyalgia syndrome    Hyperlipidemia    Hypertension    Osteoporosis    Retinopathy    Stage 3a chronic kidney disease (HCC) 05/08/2022   Past Surgical History:  Past Surgical History:  Procedure Laterality Date   CESAREAN SECTION     TONSILECTOMY, ADENOIDECTOMY, BILATERAL MYRINGOTOMY AND TUBES     TUBAL LIGATION     HPI:  Lindsey [name] is a 72 y.o. female with medical history significant of Dementia with behavioral disturbance, type 2 diabetes, hypertension, hyperlipidemia, depression, osteopenia who lives alone who was brought in for weakness and falls. Patient unable to provide accurate history.  History obtained by speaking with the ER physician and reviewing the medical record.  Patient lives independently and does all of her activities of daily living with family support close by.  Family noted her to be increasingly weak and confused and patient had a witnessed fall.   MRI: No acute intracranial abnormality.  2. Findings of chronic small vessel ischemia and generalized volume  loss.    CXR: no active disease.    Assessment / Plan / Recommendation  Clinical Impression   Lindsey [name] seen for BSE today. Lindsey [name] resting in bed; MOD++ support to sit more upright and midline. Lindsey [name] mumbled responses intemittently but confusion noted in responses. Lindsey [name] has a Baseline dx of Dementia per chart. Lives alone? Per Outpatient Neurology note 06/2023: "Last memory score on file was 8/30.  Symptoms most consistent with moderate-severe dementia.". Increased leve of care was recommended by Neurologist then d/t Lindsey [name] only having caregivers in the home at times during the day but no direct Supervision at night, per chart note.  On RA, WBC WNL. Afebrile.  Lindsey [name] appears to present w/ grossly functional pharyngeal phase swallowing w/ No pharyngeal phase dysphagia noted, No gross neuromuscular deficits noted. Lindsey [name] exhibits min+ increased oral phase time w/ increased textured foods -- suspect direct impact from Baseline Dementia and Cognitive decline in setting of illness. ANY Cognitive decline can impact overall awareness/timing of swallow and safety during po tasks which increases risk for aspiration, choking. It can also impact Lindsey [name]'s desire for oral intake.  Lindsey [name]'s risk for aspiration can be reduced when following general aspiration precautions and using a modified diet consistency of broken down foods moistened for easy oral phase mastication/management/clearing.   Lindsey [name] consumed po trials w/ No overt, clinical s/s of aspiration during po trials. Lindsey [name] has challenging factors that could impact her oropharyngeal swallowing to include Baseline Dementia/Cognitive decline, need for support w/ feeding/oral intake, deconditioning/weakness, and more sedentary status currently.    During po trials, Lindsey [name] consumed consistencies w/ no overt coughing, decline in vocal quality, or change in respiratory presentation during/post trials. O2 sats remained upper 90s when checked. Oral phase appeared grossly [facility] w/ timely bolus management and control of bolus propulsion for A-P transfer for swallowing of liquids and purees. However, noted min+ increased oral phase time w/ mastication Time/effort  of increased textured trials -- softened solids in a puree. Given Time, oral clearing achieved w/ all trial consistencies. NSG endorsed issues attempting to chew solid foods at breakfast meal. Suspect direct impact of Cognitive decline on  attention to tasks. OM Exam appeared [facility] during bolus management/oral clearing and speech w/ no unilateral weakness noted. Speech mumbled often. Lindsey [name] could help to Hold Cup to feed self w/ mod+ setup support -- this increases safety w/ drinking.    Recommend a more MINCED consistency diet(dys. Level 2) w/ well-moistened foods for ease of oral phase chewing/Time; Thin liquids. Lindsey [name] should Hold Cup when drinking. Recommend general aspiration precautions, reduce distractions during meals. Engage Lindsey [name] in self feeding as much as possible; straw use ok if no increaed coughing noted. Tray setup and sitting fully upright for all oral intake. Pills CRUSHED in Puree for safer, easier swallowing -- encouraged now and for D/C.  Education given on Pills in Puree; food consistencies and easy to eat options; general aspiration precautions to Lindsey [name] and NSG. No further skilled ST services indicated as this diet consistency will best support safer oral intake and hopefully increase oral intake. Further ST services could be had at next venue of care if indicated. MD/NSG to reconsult if any new needs arise during admit. NSG updated, agreed. MD updated. Handouts posted. Recommend Dietician f/u for support. Recommend Palliative Care f/u w/ GOC discussion moving forward d/t impact of Cognitive decline on swallowing and oral intake.  SLP Visit Diagnosis: Dysphagia, oral phase (R13.11) (suspect impact from Baseline Dementia)    Aspiration Risk  Mild aspiration risk;Risk for inadequate nutrition/hydration (reduced when following general aspiration precautions and using a modified diet consistency for ease of oral phase)    Diet Recommendation   Thin;Dysphagia 2 (chopped) = a more MINCED consistency diet(dys. Level 2) w/ well-moistened foods for ease of oral phase chewing/Time; Thin liquids. Lindsey [name] should Hold Cup when drinking. Recommend general aspiration precautions, reduce distractions during meals. Engage Lindsey [name] in self feeding as much as  possible; straw use ok if no increaed coughing noted. Tray setup and sitting fully upright for all oral intake.   Medication Administration: Crushed with puree    Other  Recommendations Recommended Consults:  (Dietician f/u; Palliative Care f/u for GOC) Oral Care Recommendations: Oral care BID;Oral care before and after PO;Staff/trained caregiver to provide oral care    Recommendations for follow up therapy are one component of a multi-disciplinary discharge planning process, led by the attending physician.  Recommendations may be updated based on patient status, additional functional criteria and insurance authorization.  Follow up Recommendations No SLP follow up      Assistance Recommended at Discharge  Full d/t Dementia  Functional Status Assessment Patient has had a recent decline in their functional status and/or demonstrates limited ability to make significant improvements in function in a reasonable and predictable amount of time  Frequency and Duration  (n/a)   (n/a)       Prognosis Prognosis for improved oropharyngeal function: Fair Barriers to Reach Goals: Cognitive deficits;Language deficits;Time post onset;Severity of deficits;Behavior Barriers/Prognosis Comment: baseline Dementia; dependency for feeding      Swallow Study   General Date of Onset: 08/05/23 HPI: Lindsey [name] is a 72 y.o. female with medical history significant of Dementia with behavioral disturbance, type 2 diabetes, hypertension, hyperlipidemia, depression, osteopenia who lives alone who was brought in for weakness and falls. Patient unable to provide accurate history.  History obtained by speaking with the ER physician and reviewing the medical  record.  Patient lives independently and does all of her activities of daily living with family support close by.  Family noted her to be increasingly weak and confused and patient had a witnessed fall.   MRI: No acute intracranial abnormality.  2. Findings of chronic small  vessel ischemia and generalized volume  loss.    CXR: no active disease. Type of Study: Bedside Swallow Evaluation Previous Swallow Assessment: none Diet Prior to this Study: Regular;Thin liquids (Level 0) Temperature Spikes Noted: No (wbc 10.0) Respiratory Status: Room air History of Recent Intubation: No Behavior/Cognition: Alert;Cooperative;Confused;Pleasant mood;Distractible;Requires cueing (dementia baseline) Oral Cavity Assessment: Within Functional Limits Oral Care Completed by SLP: Yes (attempted) Oral Cavity - Dentition: Missing dentition (few) Vision:  (n/a) Self-Feeding Abilities: Total assist (did not attempt) Patient Positioning: Upright in bed (needed full positioning support) Baseline Vocal Quality: Low vocal intensity (mumbled speech) Volitional Cough: Cognitively unable to elicit Volitional Swallow: Unable to elicit    Oral/Motor/Sensory Function Overall Oral Motor/Sensory Function: Within functional limits (w/ bolus management and oral clearing)   Ice Chips Ice chips: Within functional limits Presentation: Spoon (fed; 2 trials)   Thin Liquid Thin Liquid: Within functional limits Presentation: Straw (fed and monitored straw use: 10+ trials) Other Comments: seemed to ejoy sipping drinks    Nectar Thick Nectar Thick Liquid: Not tested   Honey Thick Honey Thick Liquid: Not tested   Puree Puree: Within functional limits Presentation: Spoon (fed; 10 trials)   Solid     Solid: Impaired Presentation: Spoon (fed; 7 trials) Oral Phase Impairments: Impaired mastication;Poor awareness of bolus (increased time) Oral Phase Functional Implications: Impaired mastication;Prolonged oral transit (increased time) Pharyngeal Phase Impairments:  (none)        Jerilynn Som, MS, CCC-SLP Speech Language Pathologist Rehab Services; [facility] [place] [phone] (ascom) Giuliano Preece 08/14/2023,3:14 PM

## 2023-08-15 ENCOUNTER — Inpatient Hospital Stay: Payer: 59

## 2023-08-15 DIAGNOSIS — F05 Delirium due to known physiological condition: Secondary | ICD-10-CM | POA: Diagnosis not present

## 2023-08-15 DIAGNOSIS — R531 Weakness: Secondary | ICD-10-CM | POA: Diagnosis not present

## 2023-08-15 DIAGNOSIS — F03918 Unspecified dementia, unspecified severity, with other behavioral disturbance: Secondary | ICD-10-CM | POA: Diagnosis not present

## 2023-08-15 DIAGNOSIS — Z515 Encounter for palliative care: Secondary | ICD-10-CM | POA: Diagnosis not present

## 2023-08-15 LAB — GLUCOSE, CAPILLARY
Glucose-Capillary: 194 mg/dL — ABNORMAL HIGH (ref 70–99)
Glucose-Capillary: 218 mg/dL — ABNORMAL HIGH (ref 70–99)
Glucose-Capillary: 354 mg/dL — ABNORMAL HIGH (ref 70–99)
Glucose-Capillary: 209 mg/dL — ABNORMAL HIGH (ref 70–99)

## 2023-08-15 MED ORDER — BLISTEX MEDICATED EX OINT
1.0000 | TOPICAL_OINTMENT | CUTANEOUS | Status: DC | PRN
  Filled 2023-08-15: qty 6.3

## 2023-08-15 MED ORDER — SODIUM CHLORIDE 0.9 % IV SOLN
2.0000 g | INTRAVENOUS | Status: DC
  Administered 2023-08-15 – 2023-08-16 (×2): 2 g via INTRAVENOUS
  Filled 2023-08-15 (×2): qty 20

## 2023-08-15 MED ORDER — SODIUM CHLORIDE 0.9 % IV SOLN: INTRAVENOUS | Status: AC

## 2023-08-15 MED ORDER — THIAMINE HCL 100 MG/ML IJ SOLN
500.0000 mg | INTRAVENOUS | Status: DC
  Administered 2023-08-15 – 2023-08-16 (×2): 500 mg via INTRAVENOUS
  Filled 2023-08-15 (×2): qty 5

## 2023-08-15 MED ORDER — FLUCONAZOLE 100 MG PO TABS
100.0000 mg | ORAL_TABLET | Freq: Every day | ORAL | Status: DC
  Administered 2023-08-15 – 2023-08-16 (×2): 100 mg via ORAL
  Filled 2023-08-15 (×2): qty 1

## 2023-08-15 MED ORDER — BISACODYL 10 MG RE SUPP
10.0000 mg | Freq: Once | RECTAL | Status: AC
  Administered 2023-08-15: 10 mg via RECTAL
  Filled 2023-08-15: qty 1

## 2023-08-15 MED ORDER — LACTULOSE 10 GM/15ML PO SOLN
20.0000 g | Freq: Once | ORAL | Status: AC
  Administered 2023-08-15: 20 g via ORAL
  Filled 2023-08-15: qty 30

## 2023-08-15 NOTE — TOC Progression Note (Signed)
Transition of Care [facility]) - Progression Note    Patient Details  Name: Lindsey [name] MRN: [id number] Date of Birth: 10-12-1951  [facility] of [facility]) CM/SW Contact  Allena Katz, LCSW Phone Number: 08/15/2023, 10:18 AM  Clinical Narrative:    Berkley Harvey started for [facility]. Deborah notified.         Expected Discharge Plan and Services                                               Social Determinants of Health (SDOH) Interventions SDOH Screenings   Food Insecurity: No Food Insecurity (08/06/2023)  Housing: Low Risk  (08/06/2023)  Transportation Needs: No Transportation Needs (08/06/2023)  Utilities: Not At Risk (08/06/2023)  Alcohol Screen: Low Risk  (08/22/2022)  Depression (PHQ2-9): Low Risk  (05/30/2023)  Financial Resource Strain: Low Risk  (01/02/2023)  Physical Activity: Unknown (01/02/2023)  Social Connections: Socially Isolated (01/02/2023)  Stress: No Stress Concern Present (01/02/2023)  Tobacco Use: Low Risk  (08/06/2023)  Recent Concern: Tobacco Use - Medium Risk (07/10/2023)   Received from [facility]    Readmission Risk Interventions     No data to display

## 2023-08-15 NOTE — Care Management Important Message (Signed)
Important Message  Patient Details  Name: Lindsey [name] MRN: [id number] Date of Birth: [birth year]   Important Message Given:  Other (see comment)  Called to review the form but family wanted to talk with the care manager as they want to transfer to another hospital versus [facility].     Olegario Messier A Quetzal Meany 08/15/2023, 3:39 PM

## 2023-08-15 NOTE — Progress Notes (Signed)
MEWS Progress Note  Patient Details Name: Lindsey [name] MRN: [id number] DOB: 1951/03/03 Today's Date: 08/15/2023   MEWS Flowsheet Documentation:  Assess: MEWS Score Temp: 97.9 F (36.6 C) BP: 95/83 MAP (mmHg): 89 Pulse Rate: (!) 112 Resp: 17 Level of Consciousness: Alert SpO2: 98 % O2 Device: Room Air Assess: MEWS Score MEWS Temp: 0 MEWS Systolic: 1 MEWS Pulse: 2 MEWS RR: 0 MEWS LOC: 0 MEWS Score: 3 MEWS Score Color: Yellow Assess: SIRS CRITERIA SIRS Temperature : 0 SIRS Respirations : 0 SIRS Pulse: 1 SIRS WBC: 0 SIRS Score Sum : 1 SIRS Temperature : 0 SIRS Pulse: 1 SIRS Respirations : 0 SIRS WBC: 0 SIRS Score Sum : 1 Assess: if the MEWS score is Yellow or Red Were vital signs accurate and taken at a resting state?: Yes Does the patient meet 2 or more of the SIRS criteria?: No MEWS guidelines implemented : No, previously yellow, continue vital signs every 4 hours Treat MEWS Interventions: Considered administering scheduled or prn medications/treatments as ordered Take Vital Signs Increase Vital Sign Frequency : Yellow: Q2hr x1, continue Q4hrs until patient remains green for 12hrs Escalate MEWS: Escalate: Yellow: Discuss with charge nurse and consider notifying provider and/or RRT Notify: Charge Nurse/RN Name of Charge Nurse/RN Notified: Annice Pih, RN      Ernest Mallick 08/15/2023, 12:16 AM

## 2023-08-15 NOTE — Plan of Care (Signed)
Lindsey [name]/duaghter called to request transfer to [facility]. Even gave me the # for Dr. to call. Dr. Informed and will address as able and place a request at call [phone].

## 2023-08-15 NOTE — Progress Notes (Addendum)
PROGRESS NOTE    Lindsey [name]  WJX:[id number] DOB: 1951/09/11 DOA: 08/05/2023 PCP: Alba Cory, MD   Brief Narrative: 72 year old with past medical history significant for dementia with behavioral disturbance, diabetes type 2, hypertension, hyperlipidemia, depression, osteopenia who lives alone who was brought in for weakness and fall.  Patient unable to provide accurate history.  History obtained from chart review.  Patient lives independently and does all her activities of daily living with family support close by.  Family noted that she has been increasingly weak and confused and had a witnessed fall.  Unclear if this was a syncopal episode or now.  Patient was admitted for further evaluation.   Assessment & Plan:   Principal Problem:   Fall Active Problems:   Benign essential HTN   Dementia with behavioral disturbance (HCC)   Type 2 diabetes mellitus with microalbuminuria, without long-term current use of insulin (HCC)   1-Ambulatory dysfunction, FTT Patient presented with progressive weakness, trouble getting out of the bed she slid out of the chair at home as well. MRI brain negative.  MRI of the lumbar spine no signs of canal stenosis or cord compression. PT recommended SNF awaiting insurance approval Treat for UTI.  Started Thiamine IV  Poor oral intake: Tachycardia: Started  IV fluids for 24 hours Seem more alert today.  Check KUB ruled out obstruction.   Acute urinary retention Developed urinary retention 10/23 required in and out cath for 1 day.  She has been urinating spontaneously Monitor urine out put, bladder scan.   Chronic L1 and L3 compression fractures Pain management.  Follow-up with PT  Dementia with behavioral disturbance and Alzheimer Delirium  She has been having some hospital delirium. Continue sertraline, donepezil, Namenda. I have reduced Seroquel to 50 mg during the days and continue with 100 mg at night Palliative care consulted.  Poor  oral intake.  She is more alert today. Discussed with Nursing staff if she gets more agitated we could increase Seroquel to 75 mg Will treat for UTI empirically with 3 days antibiotics. Will tx yeast  DM type 2;  Continue with SSI.   Hyperlipidemia; Continue with a statin  Essential hypertension Continue with Coreg.   History of gout Continue with allopurinol  History of B12 deficiency Continue with B12 supplements   Hypomagnesemia;Replaced.   Addendum;  Family request transfer to [facility]. I called [facility] they  are at capacity . Medical Director needs to review case. If patient is accepted she will be in waiting list.  Waiting call back.   Nutrition Problem: Increased nutrient needs Etiology: chronic illness (dementia)    Signs/Symptoms: estimated needs    Interventions: Ensure Enlive (each supplement provides 350kcal and 20 grams of protein), MVI  Estimated body mass index is 29.29 kg/m as calculated from the following:   Height as of this encounter: 4\' 11"  (1.499 m).   Weight as of this encounter: 65.8 kg.   DVT prophylaxis: Start Lovenox Code Status: Full code Family Communication: Updated Daughter over phone 10/31. Disposition Plan:  Status is: Inpatient Remains inpatient appropriate because: Awaiting SNF    Consultants:  Palliative  Procedures:    Antimicrobials:    Subjective: She is more alert today, answer questions, pleasantly confuse.   Objective: Vitals:   08/14/23 2009 08/14/23 2347 08/15/23 0430 08/15/23 0758  BP: 116/78 95/83 118/73 114/75  Pulse: (!) 109 (!) 112 (!) 103 (!) 108  Resp:  17 18 16   Temp: (!) 97.5 F (36.4 C) 97.9 F (36.6  C) 98.2 F (36.8 C) (!) 97.3 F (36.3 C)  TempSrc: Oral Oral Oral   SpO2: 99% 98% 97% 99%  Weight:      Height:        Intake/Output Summary (Last 24 hours) at 08/15/2023 0915 Last data filed at 08/15/2023 0431 Gross per 24 hour  Intake 1864.51 ml  Output 600 ml  Net 1264.51 ml   Filed  Weights   08/05/23 0943  Weight: 65.8 kg    Examination:  General exam: NAD Respiratory system: CTA Cardiovascular system: S 1, S 2 RRR Gastrointestinal system: BS present, soft, nt Central nervous system: Alert Extremities: no edema  Data Reviewed: I have personally reviewed following labs and imaging studies  CBC: Recent Labs  Lab 08/09/23 0905 08/10/23 0529 08/11/23 0605 08/12/23 0440  WBC 8.1 8.9 9.0 10.0  HGB 11.5* 11.3* 11.1* 11.1*  HCT 34.0* 34.4* 33.3* 32.7*  MCV 84.8 86.0 86.3 85.2  PLT 215 232 242 246   Basic Metabolic Panel: Recent Labs  Lab 08/09/23 0905 08/10/23 0529 08/11/23 0605 08/12/23 0440 08/14/23 0800  NA 139 141 138 138 139  K 3.6 3.7 3.6 3.6 3.7  CL 104 103 102 100 101  CO2 25 24 24 24 27   GLUCOSE 137* 140* 179* 199* 210*  BUN 20 21 27* 28* 30*  CREATININE 0.88 0.95 0.93 0.86 0.82  CALCIUM 10.1 10.2 10.0 10.3 9.9  MG 1.7 1.7 1.7 1.8 1.7  PHOS 3.4 3.5 2.8 2.6 2.8   GFR: Estimated Creatinine Clearance: 50.4 mL/min (by C-G formula based on SCr of 0.82 mg/dL). Liver Function Tests: No results for input(s): "AST", "ALT", "ALKPHOS", "BILITOT", "PROT", "ALBUMIN" in the last 168 hours. No results for input(s): "LIPASE", "AMYLASE" in the last 168 hours. No results for input(s): "AMMONIA" in the last 168 hours. Coagulation Profile: No results for input(s): "INR", "PROTIME" in the last 168 hours. Cardiac Enzymes: No results for input(s): "CKTOTAL", "CKMB", "CKMBINDEX", "TROPONINI" in the last 168 hours. BNP (last 3 results) No results for input(s): "PROBNP" in the last 8760 hours. HbA1C: No results for input(s): "HGBA1C" in the last 72 hours. CBG: Recent Labs  Lab 08/14/23 0838 08/14/23 1129 08/14/23 1646 08/14/23 2008 08/15/23 0759  GLUCAP 206* 235* 257* 217* 194*   Lipid Profile: No results for input(s): "CHOL", "HDL", "LDLCALC", "TRIG", "CHOLHDL", "LDLDIRECT" in the last 72 hours. Thyroid Function Tests: No results for input(s):  "TSH", "T4TOTAL", "FREET4", "T3FREE", "THYROIDAB" in the last 72 hours. Anemia Panel: No results for input(s): "VITAMINB12", "FOLATE", "FERRITIN", "TIBC", "IRON", "RETICCTPCT" in the last 72 hours. Sepsis Labs: No results for input(s): "PROCALCITON", "LATICACIDVEN" in the last 168 hours.  Recent Results (from the past 240 hour(s))  Blood culture (routine x 2)     Status: None   Collection Time: 08/05/23 11:46 AM   Specimen: BLOOD RIGHT ARM  Result Value Ref Range Status   Specimen Description BLOOD RIGHT ARM  Final   Special Requests   Final    BOTTLES DRAWN AEROBIC AND ANAEROBIC Blood Culture results may not be optimal due to an inadequate volume of blood received in culture bottles   Culture   Final    NO GROWTH 5 DAYS Performed at [facility], [address] [place], [place] [address]    Report Status 08/10/2023 FINAL  Final  Blood culture (routine x 2)     Status: None   Collection Time: 08/05/23 11:46 AM   Specimen: BLOOD LEFT HAND  Result Value Ref Range Status  Specimen Description BLOOD LEFT HAND  Final   Special Requests   Final    BOTTLES DRAWN AEROBIC AND ANAEROBIC Blood Culture results may not be optimal due to an inadequate volume of blood received in culture bottles   Culture   Final    NO GROWTH 5 DAYS Performed at [facility], [address] [place], [place] [address]    Report Status 08/10/2023 FINAL  Final  Resp panel by RT-PCR (RSV, Flu A&B, Covid) Anterior Nasal Swab     Status: None   Collection Time: 08/05/23 11:47 AM   Specimen: Anterior Nasal Swab  Result Value Ref Range Status   SARS Coronavirus 2 by RT PCR NEGATIVE NEGATIVE Final    Comment: (NOTE) SARS-CoV-2 target nucleic acids are NOT DETECTED.  The SARS-CoV-2 RNA is generally detectable in upper respiratory specimens during the acute phase of infection. The lowest concentration of SARS-CoV-2 viral copies this assay can detect is 138 copies/mL. A negative result does not  preclude SARS-Cov-2 infection and should not be used as the sole basis for treatment or other patient management decisions. A negative result may occur with  improper specimen collection/handling, submission of specimen other than nasopharyngeal swab, presence of viral mutation(s) within the areas targeted by this assay, and inadequate number of viral copies(<138 copies/mL). A negative result must be combined with clinical observations, patient history, and epidemiological information. The expected result is Negative.  Fact Sheet for Patients:  BloggerCourse.com  Fact Sheet for Healthcare Providers:  SeriousBroker.it  This test is no t yet approved or cleared by the [place] FDA and  has been authorized for detection and/or diagnosis of SARS-CoV-2 by FDA under an Emergency Use Authorization (EUA). This EUA will remain  in effect (meaning this test can be used) for the duration of the COVID-19 declaration under Section 564(b)(1) of the Act, 21 U.S.C.section 360bbb-3(b)(1), unless the authorization is terminated  or revoked sooner.       Influenza A by PCR NEGATIVE NEGATIVE Final   Influenza B by PCR NEGATIVE NEGATIVE Final    Comment: (NOTE) The Xpert Xpress SARS-CoV-2/FLU/RSV plus assay is intended as an aid in the diagnosis of influenza from Nasopharyngeal swab specimens and should not be used as a sole basis for treatment. Nasal washings and aspirates are unacceptable for Xpert Xpress SARS-CoV-2/FLU/RSV testing.  Fact Sheet for Patients: BloggerCourse.com  Fact Sheet for Healthcare Providers: SeriousBroker.it  This test is not yet approved or cleared by the [place] FDA and has been authorized for detection and/or diagnosis of SARS-CoV-2 by FDA under an Emergency Use Authorization (EUA). This EUA will remain in effect (meaning this test can be used) for the  duration of the COVID-19 declaration under Section 564(b)(1) of the Act, 21 U.S.C. section 360bbb-3(b)(1), unless the authorization is terminated or revoked.     Resp Syncytial Virus by PCR NEGATIVE NEGATIVE Final    Comment: (NOTE) Fact Sheet for Patients: BloggerCourse.com  Fact Sheet for Healthcare Providers: SeriousBroker.it  This test is not yet approved or cleared by the [place] FDA and has been authorized for detection and/or diagnosis of SARS-CoV-2 by FDA under an Emergency Use Authorization (EUA). This EUA will remain in effect (meaning this test can be used) for the duration of the COVID-19 declaration under Section 564(b)(1) of the Act, 21 U.S.C. section 360bbb-3(b)(1), unless the authorization is terminated or revoked.  Performed at [facility], [address] [place], [place] [address]   Urine Culture  Status: Abnormal   Collection Time: 08/12/23  9:40 PM   Specimen: Urine, Random  Result Value Ref Range Status   Specimen Description   Final    URINE, RANDOM Performed at [facility], [address] [place], [place] [address]    Special Requests   Final    NONE Reflexed from [id number] Performed at [facility], [address] [place], [place] [address]    Culture MULTIPLE SPECIES PRESENT, SUGGEST RECOLLECTION (A)  Final   Report Status 08/14/2023 FINAL  Final         Radiology Studies: No results found.      Scheduled Meds:  allopurinol  100 mg Oral BID   carvedilol  6.25 mg Oral BID WC   cyanocobalamin  1,000 mcg Oral Daily   donepezil  10 mg Oral Daily   enoxaparin (LOVENOX) injection  40 mg Subcutaneous Q24H   feeding supplement  237 mL Oral TID BM   icosapent Ethyl  2 g Oral BID   insulin aspart  0-15 Units Subcutaneous TID WC   insulin aspart  0-5 Units Subcutaneous QHS   memantine  10 mg Oral BID   multivitamin with minerals  1 tablet Oral Q lunch    QUEtiapine  100 mg Oral QHS   QUEtiapine  50 mg Oral BH-q7a   QUEtiapine  50 mg Oral Daily   rosuvastatin  20 mg Oral Daily   sertraline  50 mg Oral Daily   Continuous Infusions:  cefTRIAXone (ROCEPHIN)  IV       LOS: 9 days    Time spent: 35 minutes    Ziona Wickens A Maryam Feely, MD [facility]   If 7PM-7AM, please contact night-coverage [email or web address]  08/15/2023, 9:15 AM

## 2023-08-15 NOTE — Progress Notes (Addendum)
Palliative Care Progress Note, Assessment & Plan   Patient Name: Lindsey [name]       Date: 08/15/2023 DOB: 1951/09/16  Age: 72 y.o. MRN#: [id number] Attending Physician: Alba Cory, MD Primary Care Physician: Alba Cory, MD Admit Date: 08/05/2023  Subjective: Patient is lying in bed on her right side in no apparent distress.  She is asleep but is easily awakened.  She acknowledges my presence and is able to make her wishes known.  She complemented me, saying that I looked "very nice today".  No family or friends present during my visit.  HPI: 72 y.o. female  with past medical history of dementia, type 2 diabetes, HTN, HLD, depression, osteopenia, B12 deficiency, and gout admitted on 08/05/2023 with weakness and falls.    Patient is being treated for ambulatory dysfunction, acute urinary retention, L1 and L3 chronic compression fractures, and dementia with behavioral disturbances.   MRI of brain revealed no acute intracranial abnormalities.   PMT was consulted to discuss goals of care.  Summary of counseling/coordination of care: Extensive chart review completed prior to meeting patient including labs, vital signs, imaging, progress notes, orders, and available advanced directive documents from current and previous encounters.   After reviewing the patient's chart and assessing the patient at bedside, I spoke with patient in regards to symptom management and plan of care.  Symptoms assessed.  Patient denies discomfort, pain, N/V/D, headache, or chest pain at this time.  She shares she feels well and just wants to rest.  No adjustment to [facility] needed at this time.  I attempted to discuss boundaries and goals of care with patient.  She stared at me and had no response.  Patient unable to  participate in goals of care discussions independently at this time.  I counseled with attending Dr. Sunnie Nielsen and dayshift RN.  As per chart review, plan is for [facility] to [facility] to [facility] once insurance authorization is received.  No acute palliative needs at this time.  Patient's family has PMT contact info and was encouraged to call with any acute palliative questions or needs.  PMT will continue to follow and support patient and family throughout her hospitalization.  Physical Exam Vitals reviewed.  Constitutional:      General: She is not in acute distress.    Appearance: She is normal weight.  HENT:     Head: Normocephalic.     Mouth/Throat:     Mouth: Mucous membranes are moist.  Eyes:     Pupils: Pupils are equal, round, and reactive to light.  Pulmonary:     Effort: Pulmonary effort is normal.  Abdominal:     Palpations: Abdomen is soft.  Musculoskeletal:     Comments: Generalized weakness  Skin:    General: Skin is warm and dry.     Coloration: Skin is pale.  Neurological:     Mental Status: She is alert.     Comments: Oriented to self  Psychiatric:        Mood and Affect: Mood normal.             Total Time 25 minutes   Time spent includes: Detailed review of medical records (labs, imaging,  vital signs), medically appropriate exam (mental status, respiratory, cardiac, skin), discussed with treatment team, counseling and educating patient, family and staff, documenting clinical information, medication management and coordination of care.  Samara Deist L. Bonita Quin, DNP, FNP-BC Palliative Medicine Team

## 2023-08-15 NOTE — Progress Notes (Addendum)
No BM charted since 08/08/23. Patient has active BS in all 4 quadrants, abdomen slightly distended, soft. Non-tender.   RN notified Dr. Valente David of the above information.  Verbal order received: Fleet enema daily prn.   RN attempted to administer enema. Patient declined the enema. RN rechecked with the patient x2 to see if this RN could adminster the enema, the patient states it would be okay for the enema to be administered and then when the RN brings the enema into the room the patient declines again.

## 2023-08-15 NOTE — Progress Notes (Signed)
Troy Sine, patient's daughter, would like a phone call from the physician regarding yeast in urine on lab report.   Phone number: [phone]

## 2023-08-15 NOTE — Plan of Care (Signed)

## 2023-08-15 NOTE — Progress Notes (Signed)
Physical Therapy Treatment Patient Details Name: Lindsey [name] MRN: [id number] DOB: 08-30-1951 Today's Date: 08/15/2023   History of Present Illness Lindsey [name] is a 72 y.o. female with medical history significant of dementia with behavioral disturbance, type 2 diabetes, hypertension, hyperlipidemia, depression, osteopenia who lives alone who was brought in for weakness and falls. Patient unable to provide accurate history.  History obtained by speaking with the ER physician and reviewing the medical record.  Patient lives independently and does all of her activities of daily living with family support close by.  Family noted her to be increasingly weak and confused and patient had a witnessed fall.  Unclear if this was syncopal event or not.    PT Comments  Patient received in bed, she is confused. Speaking but not making sense. Unable to tell me where she is. Patient required max assist for supine to sit. She was able to help some this session. Able to sit unsupported at edge of  bed initially, but then required cues to lean forward due to increasing posterior lean. Patient was able to stand with +2 hand held assist and took 1 side step at edge of bed. She will continue to benefit from skilled PT to improve safety and independence.    If plan is discharge home, recommend the following: Assistance with feeding;Assistance with cooking/housework;Direct supervision/assist for medications management;Direct supervision/assist for financial management;Assist for transportation;Help with stairs or ramp for entrance;Supervision due to cognitive status;A lot of help with walking and/or transfers;A lot of help with bathing/dressing/bathroom   Can travel by private vehicle      No  Equipment Recommendations  None recommended by PT    Recommendations for Other Services       Precautions / Restrictions Precautions Precautions: Fall Restrictions Weight Bearing Restrictions: No     Mobility  Bed  Mobility Overal bed mobility: Needs Assistance Bed Mobility: Supine to Sit, Sit to Supine     Supine to sit: Max assist, HOB elevated Sit to supine: Max assist, +2 for physical assistance   General bed mobility comments: Patient occasionally resistant to movement    Transfers Overall transfer level: Needs assistance Equipment used: 2 person hand held assist Transfers: Sit to/from Stand Sit to Stand: Max assist, +2 physical assistance           General transfer comment: Attempted STS x 2, able to get fully standing on second attempt. Able to take 1 side step    Ambulation/Gait               General Gait Details: unable   Stairs             Wheelchair Mobility     Tilt Bed    Modified Rankin (Stroke Patients Only)       Balance Overall balance assessment: Needs assistance Sitting-balance support: Feet supported Sitting balance-Leahy Scale: Fair Sitting balance - Comments: Occasionally posterior leaning in sitting, otherwise supervision for sitting balance at edge of bed Postural control: Posterior lean Standing balance support: Bilateral upper extremity supported Standing balance-Leahy Scale: Poor                              Cognition Arousal: Alert Behavior During Therapy: Flat affect Overall Cognitive Status: History of cognitive impairments - at baseline Area of Impairment: Orientation, Following commands, Safety/judgement, Awareness, Problem solving                 Orientation  Level: Disoriented to, Place, Time, Situation   Memory: Decreased short-term memory Following Commands: Follows one step commands inconsistently Safety/Judgement: Decreased awareness of safety, Decreased awareness of deficits Awareness: Intellectual Problem Solving: Decreased initiation, Difficulty sequencing, Requires verbal cues, Requires tactile cues, Slow processing General Comments: Alzheimer's type dementia        Exercises       General Comments        Pertinent Vitals/Pain Pain Assessment Breathing: normal Negative Vocalization: none Facial Expression: smiling or inexpressive Body Language: relaxed Consolability: no need to console PAINAD Score: 0    Home Living                          Prior Function            PT Goals (current goals can now be found in the care plan section) Acute Rehab PT Goals Patient Stated Goal: go home PT Goal Formulation: With patient Time For Goal Achievement: 08/19/23 Potential to Achieve Goals: Fair Progress towards PT goals: Progressing toward goals    Frequency    Min 1X/week      PT Plan      Co-evaluation              AM-PAC PT "6 Clicks" Mobility   Outcome Measure  Help needed turning from your back to your side while in a flat bed without using bedrails?: A Lot Help needed moving from lying on your back to sitting on the side of a flat bed without using bedrails?: A Lot Help needed moving to and from a bed to a chair (including a wheelchair)?: Total Help needed standing up from a chair using your arms (e.g., wheelchair or bedside chair)?: A Lot Help needed to walk in hospital room?: Total Help needed climbing 3-5 steps with a railing? : Total 6 Click Score: 9    End of Session Equipment Utilized During Treatment: Gait belt Activity Tolerance: Patient limited by fatigue Patient left: in bed;with call bell/phone within reach;with bed alarm set Nurse Communication: Mobility status PT Visit Diagnosis: Muscle weakness (generalized) (M62.81);Other abnormalities of gait and mobility (R26.89)     Time: 4034-7425 PT Time Calculation (min) (ACUTE ONLY): 18 min  Charges:    $Therapeutic Activity: 8-22 mins PT General Charges $$ ACUTE PT VISIT: 1 Visit                     Mikenzie Mccannon, PT, GCS 08/15/23,10:05 AM

## 2023-08-16 DIAGNOSIS — R2681 Unsteadiness on feet: Secondary | ICD-10-CM | POA: Diagnosis not present

## 2023-08-16 DIAGNOSIS — R10819 Abdominal tenderness, unspecified site: Secondary | ICD-10-CM | POA: Diagnosis not present

## 2023-08-16 DIAGNOSIS — E119 Type 2 diabetes mellitus without complications: Secondary | ICD-10-CM | POA: Diagnosis not present

## 2023-08-16 DIAGNOSIS — R4701 Aphasia: Secondary | ICD-10-CM | POA: Diagnosis not present

## 2023-08-16 DIAGNOSIS — K5900 Constipation, unspecified: Secondary | ICD-10-CM | POA: Diagnosis not present

## 2023-08-16 DIAGNOSIS — D649 Anemia, unspecified: Secondary | ICD-10-CM | POA: Diagnosis not present

## 2023-08-16 DIAGNOSIS — E1129 Type 2 diabetes mellitus with other diabetic kidney complication: Secondary | ICD-10-CM | POA: Diagnosis not present

## 2023-08-16 DIAGNOSIS — Z743 Need for continuous supervision: Secondary | ICD-10-CM | POA: Diagnosis not present

## 2023-08-16 DIAGNOSIS — R531 Weakness: Secondary | ICD-10-CM | POA: Diagnosis not present

## 2023-08-16 DIAGNOSIS — Z7401 Bed confinement status: Secondary | ICD-10-CM | POA: Diagnosis not present

## 2023-08-16 DIAGNOSIS — E569 Vitamin deficiency, unspecified: Secondary | ICD-10-CM | POA: Diagnosis not present

## 2023-08-16 DIAGNOSIS — M109 Gout, unspecified: Secondary | ICD-10-CM | POA: Diagnosis not present

## 2023-08-16 DIAGNOSIS — F03918 Unspecified dementia, unspecified severity, with other behavioral disturbance: Secondary | ICD-10-CM | POA: Diagnosis not present

## 2023-08-16 DIAGNOSIS — E559 Vitamin D deficiency, unspecified: Secondary | ICD-10-CM | POA: Diagnosis not present

## 2023-08-16 DIAGNOSIS — S32010D Wedge compression fracture of first lumbar vertebra, subsequent encounter for fracture with routine healing: Secondary | ICD-10-CM | POA: Diagnosis not present

## 2023-08-16 DIAGNOSIS — R627 Adult failure to thrive: Secondary | ICD-10-CM | POA: Diagnosis not present

## 2023-08-16 DIAGNOSIS — R809 Proteinuria, unspecified: Secondary | ICD-10-CM | POA: Diagnosis not present

## 2023-08-16 DIAGNOSIS — N390 Urinary tract infection, site not specified: Secondary | ICD-10-CM | POA: Diagnosis not present

## 2023-08-16 DIAGNOSIS — M6281 Muscle weakness (generalized): Secondary | ICD-10-CM | POA: Diagnosis not present

## 2023-08-16 DIAGNOSIS — Z515 Encounter for palliative care: Secondary | ICD-10-CM | POA: Diagnosis not present

## 2023-08-16 DIAGNOSIS — N1831 Chronic kidney disease, stage 3a: Secondary | ICD-10-CM | POA: Diagnosis not present

## 2023-08-16 DIAGNOSIS — S32030A Wedge compression fracture of third lumbar vertebra, initial encounter for closed fracture: Secondary | ICD-10-CM | POA: Diagnosis not present

## 2023-08-16 DIAGNOSIS — I10 Essential (primary) hypertension: Secondary | ICD-10-CM | POA: Diagnosis not present

## 2023-08-16 DIAGNOSIS — R1311 Dysphagia, oral phase: Secondary | ICD-10-CM | POA: Diagnosis not present

## 2023-08-16 DIAGNOSIS — R404 Transient alteration of awareness: Secondary | ICD-10-CM | POA: Diagnosis not present

## 2023-08-16 DIAGNOSIS — E785 Hyperlipidemia, unspecified: Secondary | ICD-10-CM | POA: Diagnosis not present

## 2023-08-16 DIAGNOSIS — W19XXXA Unspecified fall, initial encounter: Secondary | ICD-10-CM | POA: Diagnosis not present

## 2023-08-16 LAB — BASIC METABOLIC PANEL
Anion gap: 8 (ref 5–15)
BUN: 20 mg/dL (ref 8–23)
CO2: 25 mmol/L (ref 22–32)
Calcium: 9.6 mg/dL (ref 8.9–10.3)
Chloride: 108 mmol/L (ref 98–111)
Creatinine, Ser: 0.71 mg/dL (ref 0.44–1.00)
GFR, Estimated: >60 mL/min (ref >60)
Glucose, Bld: 183 mg/dL — ABNORMAL HIGH (ref 70–99)
Potassium: 3.3 mmol/L — ABNORMAL LOW (ref 3.5–5.1)
Sodium: 141 mmol/L (ref 135–145)

## 2023-08-16 LAB — CBC
HCT: 30.7 % — ABNORMAL LOW (ref 36.0–46.0)
Hemoglobin: 10 g/dL — ABNORMAL LOW (ref 12.0–15.0)
MCH: 28.7 pg (ref 26.0–34.0)
MCHC: 32.6 g/dL (ref 30.0–36.0)
MCV: 88 fL (ref 80.0–100.0)
Platelets: 252 10*3/uL (ref 150–400)
RBC: 3.49 MIL/uL — ABNORMAL LOW (ref 3.87–5.11)
RDW: 13.1 % (ref 11.5–15.5)
WBC: 8 10*3/uL (ref 4.0–10.5)
nRBC: 0 % (ref 0.0–0.2)

## 2023-08-16 LAB — GLUCOSE, CAPILLARY
Glucose-Capillary: 192 mg/dL — ABNORMAL HIGH (ref 70–99)
Glucose-Capillary: 391 mg/dL — ABNORMAL HIGH (ref 70–99)

## 2023-08-16 MED ORDER — CEPHALEXIN 500 MG PO CAPS: 500.0000 mg | ORAL_CAPSULE | Freq: Three times a day (TID) | ORAL | 0 refills | Status: AC

## 2023-08-16 MED ORDER — THIAMINE HCL 100 MG PO TABS: 100.0000 mg | ORAL_TABLET | Freq: Every day | ORAL | 0 refills | Status: DC

## 2023-08-16 MED ORDER — QUETIAPINE FUMARATE 50 MG PO TABS: 50.0000 mg | ORAL_TABLET | Freq: Every day | ORAL | 0 refills | Status: DC

## 2023-08-16 MED ORDER — ENSURE ENLIVE PO LIQD: 237.0000 mL | Freq: Three times a day (TID) | ORAL | 12 refills | Status: DC

## 2023-08-16 MED ORDER — QUETIAPINE FUMARATE 50 MG PO TABS: 50.0000 mg | ORAL_TABLET | ORAL | 0 refills | Status: DC

## 2023-08-16 MED ORDER — POTASSIUM CHLORIDE CRYS ER 20 MEQ PO TBCR
40.0000 meq | EXTENDED_RELEASE_TABLET | Freq: Once | ORAL | Status: AC
Start: 2023-08-16 — End: 2023-08-16
  Administered 2023-08-16: 40 meq via ORAL
  Filled 2023-08-16 (×2): qty 2

## 2023-08-16 MED ORDER — QUETIAPINE FUMARATE 100 MG PO TABS: 100.0000 mg | ORAL_TABLET | Freq: Every day | ORAL | 0 refills | Status: DC

## 2023-08-16 MED ORDER — FLUCONAZOLE 100 MG PO TABS: 100.0000 mg | ORAL_TABLET | Freq: Every day | ORAL | 0 refills | Status: DC

## 2023-08-16 NOTE — TOC Progression Note (Signed)
Transition of Care [facility]) - Progression Note    Patient Details  Name: Lindsey [name] MRN: [id number] Date of Birth: Mar 01, 1951  [facility]) CM/SW Contact  Allena Katz, LCSW Phone Number: 08/16/2023, 11:21 AM  Clinical Narrative:   [facility] approved for [employer], until 11/4.          Expected Discharge Plan and Services                                               Social Determinants of Health (SDOH) Interventions SDOH Screenings   Food Insecurity: No Food Insecurity (08/06/2023)  Housing: Low Risk  (08/06/2023)  Transportation Needs: No Transportation Needs (08/06/2023)  Utilities: Not At Risk (08/06/2023)  Alcohol Screen: Low Risk  (08/22/2022)  Depression (PHQ2-9): Low Risk  (05/30/2023)  Financial Resource Strain: Low Risk  (01/02/2023)  Physical Activity: Unknown (01/02/2023)  Social Connections: Socially Isolated (01/02/2023)  Stress: No Stress Concern Present (01/02/2023)  Tobacco Use: Low Risk  (08/06/2023)  Recent Concern: Tobacco Use - Medium Risk (07/10/2023)   Received from [facility]    Readmission Risk Interventions     No data to display

## 2023-08-16 NOTE — TOC Transition Note (Signed)
Transition of Care [facility]) - CM/SW Discharge Note   Patient Details  Name: Lindsey [name] MRN: [id number] Date of Birth: June 17, 1951  Transition of Care [facility]) CM/SW Contact:  Allena Katz, LCSW Phone Number: 08/16/2023, 12:02 PM   Clinical Narrative:   Pt has orders to discharge to [facility]. Daughter notified. DC summary to be sent once in. RN given number for report. Medical necessity printed to unit.     Final next level of care: Group Home Barriers to Discharge: Barriers Resolved   Patient Goals and CMS Choice CMS Medicare.gov Compare Post Acute Care list provided to:: Patient Represenative (must comment) (Daughter)    Discharge Placement                Patient chooses bed at: [facility] Patient to be transferred to facility by: [facility] Name of family member notified: Daugher michelle Patient and family notified of of transfer: 08/16/23  Discharge Plan and Services Additional resources added to the After Visit Summary for                                       Social Determinants of Health (SDOH) Interventions SDOH Screenings   Food Insecurity: No Food Insecurity (08/06/2023)  Housing: Low Risk  (08/06/2023)  Transportation Needs: No Transportation Needs (08/06/2023)  Utilities: Not At Risk (08/06/2023)  Alcohol Screen: Low Risk  (08/22/2022)  Depression (PHQ2-9): Low Risk  (05/30/2023)  Financial Resource Strain: Low Risk  (01/02/2023)  Physical Activity: Unknown (01/02/2023)  Social Connections: Socially Isolated (01/02/2023)  Stress: No Stress Concern Present (01/02/2023)  Tobacco Use: Low Risk  (08/06/2023)  Recent Concern: Tobacco Use - Medium Risk (07/10/2023)   Received from [facility]     Readmission Risk Interventions     No data to display

## 2023-08-16 NOTE — Inpatient Diabetes Management (Signed)
Inpatient Diabetes Program Recommendations  AACE/ADA: New Consensus Statement on Inpatient Glycemic Control (2015)  Target Ranges:  Prepandial:   less than 140 mg/dL      Peak postprandial:   less than 180 mg/dL (1-2 hours)      Critically ill patients:  140 - 180 mg/dL   Lab Results  Component Value Date   GLUCAP 192 (H) 08/16/2023   HGBA1C 7.2 (A) 05/06/2023    Review of Glycemic Control  Latest Reference Range & Units 08/15/23 07:59 08/15/23 11:20 08/15/23 16:39 08/15/23 21:11 08/16/23 07:33  Glucose-Capillary 70 - 99 mg/dL 161 (H) 096 (H) 045 (H) 209 (H) 192 (H)  (H): Data is abnormally high  Diabetes history: DM2 Outpatient Diabetes medications: Januvia 50 mg daily,  Jardiance 25 mg daily, Metformin 1000 BID  Current orders for Inpatient glycemic control: Novolog 0-15 units TID and 0-5 units QHS   Inpatient Diabetes Program Recommendations:     Postprandials are elevated.  Might consider:   Novolog 2-3 units TID with meals if she consumes at least 50%.     Will continue to follow while inpatient.   Thank you, Dulce Sellar, MSN, CDCES Diabetes Coordinator Inpatient Diabetes Program [phone] (team pager from 8a-5p)

## 2023-08-16 NOTE — Consult Note (Signed)
[facility] Liaison Note  08/16/2023  NANCE MCCOMBS 29-Jul-1951 [id number]  Location: RN Hospital Liaison screened the patient remotely at [facility].  Insurance: [employer]   Laurrie Toppin Stetzel is a 72 y.o. female who is a Primary Care Patient of Alba Cory, MD- [facility]. The patient was screened for readmission hospitalization with noted medium risk score for unplanned readmission risk with 1 IP in 6 months.  The patient was assessed for potential Care Management service needs for post hospital transition for care coordination. Review of patient's electronic medical record reveals patient was admitted for Fall. Pt will transition to [facility] for SNF level of care. Facility will address pt's ongoing needs for post hospital care management services.   [facility] Care Management/Population Health does not replace or interfere with any arrangements made by the Inpatient Transition of Care team.   For questions contact:   Elliot Cousin, RN, [facility] Liaison [place]   [facility], Population Health Office Hours MTWF  8:00 am-6:00 pm Direct Dial: [phone] mobile [phone] [Office toll free line] Office Hours are M-F 8:30 - 5 pm [email or web address]

## 2023-08-16 NOTE — Plan of Care (Signed)
  Problem: Coping: Goal: Ability to adjust to condition or change in health will improve Outcome: Progressing   Problem: Fluid Volume: Goal: Ability to maintain a balanced intake and output will improve Outcome: Progressing   Problem: Metabolic: Goal: Ability to maintain appropriate glucose levels will improve Outcome: Progressing   Problem: Nutritional: Goal: Maintenance of adequate nutrition will improve Outcome: Progressing   Problem: Skin Integrity: Goal: Risk for impaired skin integrity will decrease Outcome: Progressing   Problem: Tissue Perfusion: Goal: Adequacy of tissue perfusion will improve Outcome: Progressing   Problem: Education: Goal: Knowledge of General Education information will improve Description: Including pain rating scale, medication(s)/side effects and non-pharmacologic comfort measures Outcome: Progressing   Problem: Activity: Goal: Risk for activity intolerance will decrease Outcome: Progressing   Problem: Nutrition: Goal: Adequate nutrition will be maintained Outcome: Progressing   Problem: Coping: Goal: Level of anxiety will decrease Outcome: Progressing   Problem: Elimination: Goal: Will not experience complications related to bowel motility Outcome: Progressing   Problem: Pain Management: Goal: General experience of comfort will improve Outcome: Progressing   Problem: Safety: Goal: Ability to remain free from injury will improve Outcome: Progressing   Problem: Skin Integrity: Goal: Risk for impaired skin integrity will decrease Outcome: Progressing

## 2023-08-16 NOTE — Progress Notes (Addendum)
                                                     Palliative Care Progress Note, Assessment & Plan   Patient Name: AMIL MOSEMAN       Date: 08/16/2023 DOB: 03/24/1951  Age: 72 y.o. MRN#: [id number] Attending Physician: No att. providers found Primary Care Physician: Alba Cory, MD Admit Date: 08/05/2023  Subjective: Patient is lying in bed on her right side.  She is asleep but easily awakens.  She acknowledges my presence and is able to make her wishes known.  She shares that she has cooking food and getting the laundry ready for her children.  She endorses that she is 30 years old and loves being a mom.  No family or friends present during my visit.  HPI: 72 y.o. female  with past medical history of dementia, type 2 diabetes, HTN, HLD, depression, osteopenia, B12 deficiency, and gout admitted on 08/05/2023 with weakness and falls.    Patient is being treated for ambulatory dysfunction, acute urinary retention, L1 and L3 chronic compression fractures, and dementia with behavioral disturbances.   MRI of brain revealed no acute intracranial abnormalities.   PMT was consulted to discuss goals of care.  Summary of counseling/coordination of care: Extensive chart review completed prior to meeting patient including labs, vital signs, imaging, progress notes, orders, and available advanced directive documents from current and previous encounters.   After reviewing the patient's chart and assessing the patient at bedside, I spoke with patient regards to symptom management and plan of care.  Patient denies pain, discomfort, chest pain, headache, and/V/D, or other acute ailments at this time.  No adjustment to [facility] needed.  I attempted to discuss next steps with patient.  Patient shares that she has not received an update from a nurse or a doctor in several  days.  Patient continues to not have capacity to make medical decision making independently.  Discharge summary is in place.  Plan is for patient to discharge to [facility] today.  No further palliative needs at this time.  Physical Exam Vitals reviewed.  Constitutional:      General: She is not in acute distress.    Appearance: She is obese.  HENT:     Head: Normocephalic.     Mouth/Throat:     Mouth: Mucous membranes are moist.  Eyes:     Pupils: Pupils are equal, round, and reactive to light.  Pulmonary:     Effort: Pulmonary effort is normal.  Abdominal:     Palpations: Abdomen is soft.  Skin:    General: Skin is warm and dry.     Coloration: Skin is pale.  Neurological:     Mental Status: She is alert.     Comments: Oriented to self  Psychiatric:        Judgment: Judgment normal.             Total Time 25 minutes   Time spent includes: Detailed review of medical records (labs, imaging, vital signs), medically appropriate exam (mental status, respiratory, cardiac, skin), discussed with treatment team, counseling and educating patient, family and staff, documenting clinical information, medication management and coordination of care.  Samara Deist L. Bonita Quin, DNP, FNP-BC Palliative Medicine Team

## 2023-08-16 NOTE — Discharge Summary (Addendum)
Physician Discharge Summary   Patient: Lindsey [name] MRN: [id number] DOB: May 23, 1951  Admit date:     08/05/2023  Discharge date: 08/16/23  Discharge Physician: Alba Cory   PCP: Alba Cory, MD   Recommendations at discharge:   Needs Follow up with Palliative further discussion of Goals of care.  Monitor B-met.  Patient needs to be feed.  Monitor CBG. Might need to hold DM meds if not eating.   Discharge Diagnoses: Principal Problem:   Fall Active Problems:   Benign essential HTN   Dementia with behavioral disturbance (HCC)   Type 2 diabetes mellitus with microalbuminuria, without long-term current use of insulin (HCC)  Resolved Problems:   * No resolved hospital problems. *  Hospital Course: 72 year old with past medical history significant for dementia with behavioral disturbance, diabetes type 2, hypertension, hyperlipidemia, depression, osteopenia who lives alone who was brought in for weakness and fall.  Patient unable to provide accurate history.  History obtained from chart review.  Patient lives independently and does all her activities of daily living with family support close by.  Family noted that she has been increasingly weak and confused and had a witnessed fall.  Unclear if this was a syncopal episode or now.  Patient was admitted for further evaluation.         Assessment and Plan: 1-Ambulatory dysfunction, FTT Patient presented with progressive weakness, trouble getting out of the bed she slid out of the chair at home as well. MRI brain negative.  MRI of the lumbar spine no signs of canal stenosis or cord compression. PT recommended SNF  Treat for UTI. Will complete total 5 days antibiotics, complete 3 days of keflex at discharge.  Received  Thiamine IV. Discharge on oral supplement.    Poor oral intake: Tachycardia: Started  IV fluids for 24 hours Seem more alert today.  KUB negative  obstruction. Constipation. Received suppository. Had BM     Acute urinary retention Developed urinary retention 10/23 required in and out cath for 1 day.  She has been urinating spontaneously Urinating.    Chronic L1 and L3 compression fractures Pain management.  Follow-up with PT   Dementia with behavioral disturbance and Alzheimer Delirium  She has been having some hospital delirium. Continue sertraline, donepezil, Namenda. I have reduced Seroquel to 50 mg during the days and continue with 100 mg at night Palliative care consulted.  Poor oral intake.  She is more alert since Seroquel day dose reduce.  Will treat for UTI empirically with antibiotics. Will tx yeast Family doesn't think patient would want tube feeding.  Plan to discharge to rehab.   DM type 2;  Continue with SSI.  Resume home regimen.   Hyperlipidemia; Continue with a statin   Essential hypertension Continue with Coreg.    History of gout Continue with allopurinol   History of B12 deficiency Continue with B12 supplements     Hypomagnesemia;Replaced.    [facility] declined transfer due to capacity.   Nutrition Problem: Increased nutrient needs Etiology: chronic illness (dementia)       Signs/Symptoms: estimated needs       Interventions: Ensure Enlive (each supplement provides 350kcal and 20 grams of protein), MVI   Estimated body mass index is 29.29 kg/m as calculated from the following:   Height as of this encounter: 4\' 11"  (1.499 m).   Weight as of this encounter: 65.8 kg.          Consultants: Palliative Procedures performed: None Disposition: Skilled nursing  facility Diet recommendation:  Carb modified diet DISCHARGE MEDICATION: Allergies as of 08/16/2023       Reactions   Ace Inhibitors         Medication List     STOP taking these medications    losartan 25 MG tablet Commonly known as: COZAAR   Melatonin 5 MG Caps   sitaGLIPtin 50 MG tablet Commonly known as: Januvia       TAKE these medications    allopurinol  100 MG tablet Commonly known as: ZYLOPRIM Take 1 tablet by mouth twice daily   carvedilol 6.25 MG tablet Commonly known as: COREG Take 1 tablet (6.25 mg total) by mouth 2 (two) times daily with a meal.   cephALEXin 500 MG capsule Commonly known as: KEFLEX Take 1 capsule (500 mg total) by mouth 3 (three) times daily for 3 days.   cholecalciferol 25 MCG (1000 UNIT) tablet Commonly known as: VITAMIN D3 Take 1 tablet (1,000 Units total) by mouth daily.   cyanocobalamin 1000 MCG tablet Commonly known as: VITAMIN B12 Take 1,000 mcg by mouth daily.   donepezil 10 MG tablet Commonly known as: ARICEPT Take 1 tablet (10 mg total) by mouth at bedtime. What changed:  when to take this additional instructions   feeding supplement Liqd Take 237 mLs by mouth 3 (three) times daily between meals.   fluconazole 100 MG tablet Commonly known as: DIFLUCAN Take 1 tablet (100 mg total) by mouth daily. Start taking on: August 17, 2023   GLUCOSE BLOOD VI   Jardiance 25 MG Tabs tablet Generic drug: empagliflozin Take 1 tablet by mouth once daily   memantine 10 MG tablet Commonly known as: NAMENDA Take 10 mg by mouth 2 (two) times daily.   metFORMIN 500 MG 24 hr tablet Commonly known as: GLUCOPHAGE-XR Take 2 tablets (1,000 mg total) by mouth 2 (two) times daily with a meal.   MULTIPLE VITAMIN PO Take 1 tablet by mouth daily.   QUEtiapine 100 MG tablet Commonly known as: SEROQUEL Take 1 tablet (100 mg total) by mouth at bedtime. What changed:  when to take this additional instructions   QUEtiapine 50 MG tablet Commonly known as: SEROQUEL Take 1 tablet (50 mg total) by mouth daily. Take in the afternoon at 2 o'clock. What changed: You were already taking a medication with the same name, and this prescription was added. Make sure you understand how and when to take each.   QUEtiapine 50 MG tablet Commonly known as: SEROQUEL Take 1 tablet (50 mg total) by mouth every  morning. Start taking on: August 17, 2023 What changed: You were already taking a medication with the same name, and this prescription was added. Make sure you understand how and when to take each.   rosuvastatin 20 MG tablet Commonly known as: CRESTOR Take 1 tablet (20 mg total) by mouth daily.   sertraline 50 MG tablet Commonly known as: ZOLOFT Take 50 mg by mouth every evening.   thiamine 100 MG tablet Commonly known as: VITAMIN B1 Take 1 tablet (100 mg total) by mouth daily.   Vascepa 1 g capsule Generic drug: icosapent Ethyl Take 2 capsules (2 g total) by mouth 2 (two) times daily.        Contact information for after-discharge care     Destination     [facility]   Service: Skilled Paramedic information: [address] [place] [address] [phone]  Discharge Exam: Filed Weights   08/05/23 0943  Weight: 65.8 kg   General; Alert, answer questions, confuse Condition at discharge: stable  The results of significant diagnostics from this hospitalization (including imaging, microbiology, ancillary and laboratory) are listed below for reference.   Imaging Studies: DG Abd 1 View  Result Date: 08/15/2023 CLINICAL DATA:  Constipation EXAM: ABDOMEN - 1 VIEW COMPARISON:  None Available. FINDINGS: No abnormal bowel dilatation is noted. Mild amount of stool is seen in the transverse and right colon with moderate amount of stool seen in the rectum. No radio-opaque calculi or other significant radiographic abnormality are seen. IMPRESSION: Mild to moderate stool burden.  No abnormal bowel dilatation. Electronically Signed   By: Lupita Raider M.D.   On: 08/15/2023 12:48   MR LUMBAR SPINE WO CONTRAST  Result Date: 08/08/2023 CLINICAL DATA:  72 year old female with weakness and urinary retention. Fall. EXAM: MRI LUMBAR SPINE WITHOUT CONTRAST TECHNIQUE: Multiplanar, multisequence MR imaging of the  lumbar spine was performed. No intravenous contrast was administered. COMPARISON:  Lumbar spine CT 08/16/2022. FINDINGS: Segmentation: Normal with hypoplastic ribs at T12 on the comparison. Alignment: Mildly exaggerated lumbar lordosis is stable from last year. No significant scoliosis or spondylolisthesis. Vertebrae: Chronic moderate to severe L1 and L3 compression fractures appears stable and configuration from last year, and there is No marrow edema or evidence of acute osseous abnormality. Normal background bone marrow signal. Maintained other lumbar vertebral height. Anterior T11 benign vertebral hemangioma partially visible (series 7, image 9). Intact visible sacrum and SI joints. Conus medullaris and cauda equina: Conus extends to the T12-L1 level. No lower spinal cord or conus signal abnormality. Motion degraded axial images despite repeated imaging attempts. Cauda equina nerve roots appear grossly normal. Paraspinal and other soft tissues: Motion degraded axial images despite repeated imaging attempts. Grossly negative visualized abdominal viscera and paraspinal soft tissues. Disc levels: Motion degraded axial imaging limits detail, despite repeated axial imaging attempts. Still, there is no significant lumbar spinal stenosis identified despite multilevel disc degeneration, disc bulging, facet and ligament flavum hypertrophy. However, there is degenerative lumbar neural foraminal stenosis as follows: L1-L2: Moderate to severe left L1 neural foraminal stenosis appears primarily related to broad-based foraminal disc. L2-L3: Moderate to severe bilateral L2 neural foraminal stenosis appears related to bulky foraminal and/or far lateral disc (e.g. Series 5, image 3 on the right). L3-L4: Mild to moderate left and moderate to severe right L3 foraminal stenosis also appears primarily related to foraminal disc (series 5, image 3 on the right). L4-L5: Moderate to severe left and moderate right L4 foraminal stenosis  with similar foraminal disc. IMPRESSION: 1. No acute osseous abnormality. Chronic L1 and L3 compression fractures. 2. Motion degraded axial lumbar images despite repeated imaging attempts. No significant lumbar spinal stenosis identified. However, multilevel moderate to severe neural foraminal stenosis which appears primarily related to foraminal disc herniations: Left L1, bilateral L2, right greater than left L3, left greater than right L4 nerve levels. Electronically Signed   By: Odessa Fleming M.D.   On: 08/08/2023 04:30   MR BRAIN WO CONTRAST  Result Date: 08/05/2023 CLINICAL DATA:  Fall EXAM: MRI HEAD WITHOUT CONTRAST TECHNIQUE: Multiplanar, multiecho pulse sequences of the brain and surrounding structures were obtained without intravenous contrast. COMPARISON:  None Available. FINDINGS: Brain: No acute infarct, mass effect or extra-axial collection. No acute or chronic hemorrhage. There is multifocal hyperintense T2-weighted signal within the white matter. Generalized volume loss. The midline structures are normal. Vascular: Normal flow voids. Skull  and upper cervical spine: Normal calvarium and skull base. Visualized upper cervical spine and soft tissues are normal. Sinuses/Orbits:No paranasal sinus fluid levels or advanced mucosal thickening. No mastoid or middle ear effusion. Normal orbits. IMPRESSION: 1. No acute intracranial abnormality. 2. Findings of chronic small vessel ischemia and generalized volume loss. If Electronically Signed   By: Deatra Robinson M.D.   On: 08/05/2023 21:53   DG Chest Portable 1 View  Result Date: 08/05/2023 CLINICAL DATA:  Weakness.  Recent fall. EXAM: PORTABLE CHEST 1 VIEW COMPARISON:  11/15/2022. FINDINGS: Low lung volume. Bilateral lung fields are clear. Bilateral costophrenic angles are clear. Stable cardio-mediastinal silhouette. No acute osseous abnormalities. The soft tissues are within normal limits. IMPRESSION: No active disease. Electronically Signed   By: Jules Schick M.D.   On: 08/05/2023 14:22   CT HEAD WO CONTRAST ( )  Result Date: 08/05/2023 CLINICAL DATA:  ams EXAM: CT HEAD WITHOUT CONTRAST TECHNIQUE: Contiguous axial images were obtained from the base of the skull through the vertex without intravenous contrast. RADIATION DOSE REDUCTION: This exam was performed according to the departmental dose-optimization program which includes automated exposure control, adjustment of the mA and/or kV according to patient size and/or use of iterative reconstruction technique. COMPARISON:  CT Head 11/15/22 FINDINGS: Brain: No evidence of acute infarction, hemorrhage, hydrocephalus, extra-axial collection or mass lesion/mass effect. There is sequela of moderate chronic microvascular ischemic change. Vascular: No hyperdense vessel or unexpected calcification. Skull: Normal. Negative for fracture or focal lesion. Sinuses/Orbits: No middle ear or mastoid effusion. Paranasal sinuses clear. Orbits are unremarkable. Other: None. IMPRESSION: No acute intracranial abnormality. Electronically Signed   By: Lorenza Cambridge M.D.   On: 08/05/2023 12:20    Microbiology: Results for orders placed or performed during the hospital encounter of 08/05/23  Blood culture (routine x 2)     Status: None   Collection Time: 08/05/23 11:46 AM   Specimen: BLOOD RIGHT ARM  Result Value Ref Range Status   Specimen Description BLOOD RIGHT ARM  Final   Special Requests   Final    BOTTLES DRAWN AEROBIC AND ANAEROBIC Blood Culture results may not be optimal due to an inadequate volume of blood received in culture bottles   Culture   Final    NO GROWTH 5 DAYS Performed at [facility], [address] [place], [place] [address]    Report Status 08/10/2023 FINAL  Final  Blood culture (routine x 2)     Status: None   Collection Time: 08/05/23 11:46 AM   Specimen: BLOOD LEFT HAND  Result Value Ref Range Status   Specimen Description BLOOD LEFT HAND  Final   Special Requests   Final     BOTTLES DRAWN AEROBIC AND ANAEROBIC Blood Culture results may not be optimal due to an inadequate volume of blood received in culture bottles   Culture   Final    NO GROWTH 5 DAYS Performed at [facility], [address] [place], [place] [address]    Report Status 08/10/2023 FINAL  Final  Resp panel by RT-PCR (RSV, Flu A&B, Covid) Anterior Nasal Swab     Status: None   Collection Time: 08/05/23 11:47 AM   Specimen: Anterior Nasal Swab  Result Value Ref Range Status   SARS Coronavirus 2 by RT PCR NEGATIVE NEGATIVE Final    Comment: (NOTE) SARS-CoV-2 target nucleic acids are NOT DETECTED.  The SARS-CoV-2 RNA is generally detectable in upper respiratory specimens during the acute phase of infection. The lowest  concentration of SARS-CoV-2 viral copies this assay can detect is 138 copies/mL. A negative result does not preclude SARS-Cov-2 infection and should not be used as the sole basis for treatment or other patient management decisions. A negative result may occur with  improper specimen collection/handling, submission of specimen other than nasopharyngeal swab, presence of viral mutation(s) within the areas targeted by this assay, and inadequate number of viral copies(<138 copies/mL). A negative result must be combined with clinical observations, patient history, and epidemiological information. The expected result is Negative.  Fact Sheet for Patients:  BloggerCourse.com  Fact Sheet for Healthcare Providers:  SeriousBroker.it  This test is no t yet approved or cleared by the [place] FDA and  has been authorized for detection and/or diagnosis of SARS-CoV-2 by FDA under an Emergency Use Authorization (EUA). This EUA will remain  in effect (meaning this test can be used) for the duration of the COVID-19 declaration under Section 564(b)(1) of the Act, 21 U.S.C.section 360bbb-3(b)(1), unless the authorization is  terminated  or revoked sooner.       Influenza A by PCR NEGATIVE NEGATIVE Final   Influenza B by PCR NEGATIVE NEGATIVE Final    Comment: (NOTE) The Xpert Xpress SARS-CoV-2/FLU/RSV plus assay is intended as an aid in the diagnosis of influenza from Nasopharyngeal swab specimens and should not be used as a sole basis for treatment. Nasal washings and aspirates are unacceptable for Xpert Xpress SARS-CoV-2/FLU/RSV testing.  Fact Sheet for Patients: BloggerCourse.com  Fact Sheet for Healthcare Providers: SeriousBroker.it  This test is not yet approved or cleared by the [place] FDA and has been authorized for detection and/or diagnosis of SARS-CoV-2 by FDA under an Emergency Use Authorization (EUA). This EUA will remain in effect (meaning this test can be used) for the duration of the COVID-19 declaration under Section 564(b)(1) of the Act, 21 U.S.C. section 360bbb-3(b)(1), unless the authorization is terminated or revoked.     Resp Syncytial Virus by PCR NEGATIVE NEGATIVE Final    Comment: (NOTE) Fact Sheet for Patients: BloggerCourse.com  Fact Sheet for Healthcare Providers: SeriousBroker.it  This test is not yet approved or cleared by the [place] FDA and has been authorized for detection and/or diagnosis of SARS-CoV-2 by FDA under an Emergency Use Authorization (EUA). This EUA will remain in effect (meaning this test can be used) for the duration of the COVID-19 declaration under Section 564(b)(1) of the Act, 21 U.S.C. section 360bbb-3(b)(1), unless the authorization is terminated or revoked.  Performed at [facility], [address] [place], [place] [address]   Urine Culture     Status: Abnormal   Collection Time: 08/12/23  9:40 PM   Specimen: Urine, Random  Result Value Ref Range Status   Specimen Description   Final    URINE, RANDOM Performed  at [facility], [address] [place], [place] [address]    Special Requests   Final    NONE Reflexed from [phone] Performed at [facility], [address] [place], [place] [address]    Culture MULTIPLE SPECIES PRESENT, SUGGEST RECOLLECTION (A)  Final   Report Status 08/14/2023 FINAL  Final    Labs: CBC: Recent Labs  Lab 08/10/23 0529 08/11/23 0605 08/12/23 0440 08/16/23 0718  WBC 8.9 9.0 10.0 8.0  HGB 11.3* 11.1* 11.1* 10.0*  HCT 34.4* 33.3* 32.7* 30.7*  MCV 86.0 86.3 85.2 88.0  PLT 232 242 246 252   Basic Metabolic Panel: Recent Labs  Lab 08/10/23 0529 08/11/23 0605 08/12/23  0440 08/14/23 0800 08/16/23 0718  NA 141 138 138 139 141  K 3.7 3.6 3.6 3.7 3.3*  CL 103 102 100 101 108  CO2 24 24 24 27 25   GLUCOSE 140* 179* 199* 210* 183*  BUN 21 27* 28* 30* 20  CREATININE 0.95 0.93 0.86 0.82 0.71  CALCIUM 10.2 10.0 10.3 9.9 9.6  MG 1.7 1.7 1.8 1.7  --   PHOS 3.5 2.8 2.6 2.8  --    Liver Function Tests: No results for input(s): "AST", "ALT", "ALKPHOS", "BILITOT", "PROT", "ALBUMIN" in the last 168 hours. CBG: Recent Labs  Lab 08/15/23 1120 08/15/23 1639 08/15/23 2111 08/16/23 0733 08/16/23 1219  GLUCAP 218* 354* 209* 192* 391*    Discharge time spent: greater than 30 minutes.  Signed: Alba Cory, MD [facility] 08/16/2023

## 2023-08-19 DIAGNOSIS — E559 Vitamin D deficiency, unspecified: Secondary | ICD-10-CM | POA: Diagnosis not present

## 2023-08-19 DIAGNOSIS — M109 Gout, unspecified: Secondary | ICD-10-CM | POA: Diagnosis not present

## 2023-08-19 DIAGNOSIS — E785 Hyperlipidemia, unspecified: Secondary | ICD-10-CM | POA: Diagnosis not present

## 2023-08-19 DIAGNOSIS — N390 Urinary tract infection, site not specified: Secondary | ICD-10-CM | POA: Diagnosis not present

## 2023-08-19 DIAGNOSIS — E119 Type 2 diabetes mellitus without complications: Secondary | ICD-10-CM | POA: Diagnosis not present

## 2023-08-19 DIAGNOSIS — E569 Vitamin deficiency, unspecified: Secondary | ICD-10-CM | POA: Diagnosis not present

## 2023-08-19 DIAGNOSIS — I10 Essential (primary) hypertension: Secondary | ICD-10-CM | POA: Diagnosis not present

## 2023-08-19 LAB — VITAMIN B1: Vitamin B1 (Thiamine): 140.1 nmol/L (ref 66.5–200.0)

## 2023-08-21 DIAGNOSIS — I10 Essential (primary) hypertension: Secondary | ICD-10-CM | POA: Diagnosis not present

## 2023-08-21 DIAGNOSIS — S32030A Wedge compression fracture of third lumbar vertebra, initial encounter for closed fracture: Secondary | ICD-10-CM | POA: Diagnosis not present

## 2023-08-21 DIAGNOSIS — N390 Urinary tract infection, site not specified: Secondary | ICD-10-CM | POA: Diagnosis not present

## 2023-08-21 DIAGNOSIS — E119 Type 2 diabetes mellitus without complications: Secondary | ICD-10-CM | POA: Diagnosis not present

## 2023-08-21 DIAGNOSIS — R627 Adult failure to thrive: Secondary | ICD-10-CM | POA: Diagnosis not present

## 2023-08-21 DIAGNOSIS — M109 Gout, unspecified: Secondary | ICD-10-CM | POA: Diagnosis not present

## 2023-08-21 DIAGNOSIS — S32010D Wedge compression fracture of first lumbar vertebra, subsequent encounter for fracture with routine healing: Secondary | ICD-10-CM | POA: Diagnosis not present

## 2023-08-22 DIAGNOSIS — R10819 Abdominal tenderness, unspecified site: Secondary | ICD-10-CM | POA: Diagnosis not present

## 2023-08-26 DIAGNOSIS — D649 Anemia, unspecified: Secondary | ICD-10-CM | POA: Diagnosis not present

## 2023-08-26 DIAGNOSIS — E119 Type 2 diabetes mellitus without complications: Secondary | ICD-10-CM | POA: Diagnosis not present

## 2023-08-26 DIAGNOSIS — E569 Vitamin deficiency, unspecified: Secondary | ICD-10-CM | POA: Diagnosis not present

## 2023-08-26 DIAGNOSIS — E785 Hyperlipidemia, unspecified: Secondary | ICD-10-CM | POA: Diagnosis not present

## 2023-08-28 ENCOUNTER — Other Ambulatory Visit: Payer: Self-pay | Admitting: Family Medicine

## 2023-08-28 DIAGNOSIS — E785 Hyperlipidemia, unspecified: Secondary | ICD-10-CM

## 2023-09-02 ENCOUNTER — Telehealth: Payer: Self-pay

## 2023-09-02 NOTE — Transitions of Care (Post Inpatient/ED Visit) (Signed)
   09/02/2023  Name: Lindsey [name] MRN: [id number] DOB: [birth year]  Today's TOC FU Call Status: Today's TOC FU Call Status:: Unsuccessful Call (1st Attempt) Unsuccessful Call (1st Attempt) Date: 09/02/23  Attempted to reach the patient regarding the most recent Inpatient/ED visit.  Follow Up Plan: Additional outreach attempts will be made to reach the patient to complete the Transitions of Care (Post Inpatient/ED visit) call.   Signature Karena Addison, LPN [facility] Nurse Health Advisor Direct Dial [phone]

## 2023-09-02 NOTE — Transitions of Care (Post Inpatient/ED Visit) (Signed)
09/02/2023  Name: Lindsey [name] MRN: [id number] DOB: 05/03/1951  Today's TOC FU Call Status: Today's TOC FU Call Status:: Successful TOC FU Call Completed Unsuccessful Call (1st Attempt) Date: 09/02/23 [facility] FU Call Complete Date: 09/02/23 Patient's Name and Date of Birth confirmed.  Transition Care Management Follow-up Telephone Call Date of Discharge: 08/31/23 Discharge Facility: Other [facility]) Name of Other ([facility]) [facility] Type of Discharge: Inpatient Admission Primary Inpatient Discharge Diagnosis:: vit B def How have you been since you were released from the hospital?: Better Any questions or concerns?: No  Items Reviewed: Did you receive and understand the discharge instructions provided?: Yes Medications obtained,verified, and reconciled?: Yes (Medications Reviewed) Any new allergies since your discharge?: No Dietary orders reviewed?: Yes Do you have support at home?: Yes People in Home: child(ren), adult Name of Support/Comfort Primary Source: caregivers  Medications Reviewed Today: Medications Reviewed Today     Reviewed by Karena Addison, LPN (Licensed Practical Nurse) on 09/02/23 at 1509  Med List Status: <None>   Medication Order Taking? Sig Documenting Provider Last Dose Status Informant  allopurinol (ZYLOPRIM) 100 MG tablet [id number] No Take 1 tablet by mouth twice daily Alba Cory, MD 08/04/2023 Active Child, Pharmacy Records  carvedilol (COREG) 6.25 MG tablet [id number] No Take 1 tablet (6.25 mg total) by mouth 2 (two) times daily with a meal. Alba Cory, MD 08/04/2023 Active Child, Pharmacy Records  cholecalciferol (VITAMIN D3) 25 MCG (1000 UNIT) tablet [id number] No Take 1 tablet (1,000 Units total) by mouth daily. Kathrynn Running, MD 08/04/2023 Active Child, Pharmacy Records  cyanocobalamin (VITAMIN B12) 1000 MCG tablet [id number] No Take 1,000 mcg by mouth daily. [provider] 08/04/2023 Active  Child, Pharmacy Records  donepezil (ARICEPT) 10 MG tablet [id number] No Take 1 tablet (10 mg total) by mouth at bedtime.  Patient taking differently: Take 10 mg by mouth daily. In the morning   Alba Cory, MD 08/04/2023 Active Child, Pharmacy Records  feeding supplement (ENSURE ENLIVE / ENSURE PLUS) LIQD [id number]  Take 237 mLs by mouth 3 (three) times daily between meals. Regalado, Belkys A, MD  Active   fluconazole (DIFLUCAN) 100 MG tablet [id number]  Take 1 tablet (100 mg total) by mouth daily. Alba Cory, MD  Active   GLUCOSE BLOOD VI [id number] No  [provider] Taking Active Child, Pharmacy Records           Med Note Reather Littler D   Tue Aug 15, 2021  3:42 PM)    JARDIANCE 25 MG TABS tablet [id number] No Take 1 tablet by mouth once daily Alba Cory, MD 08/04/2023 Active Child, Pharmacy Records  memantine [facility]) 10 MG tablet [id number] No Take 10 mg by mouth 2 (two) times daily. [provider] 08/04/2023 Active Child, Pharmacy Records  metFORMIN (GLUCOPHAGE-XR) 500 MG 24 hr tablet [id number] No Take 2 tablets (1,000 mg total) by mouth 2 (two) times daily with a meal. Berniece Salines, FNP 08/04/2023 Active Child, Pharmacy Records  MULTIPLE VITAMIN PO [id number] No Take 1 tablet by mouth daily. [provider] Taking Active Child, Pharmacy Records           Med Note Dollene Primrose   Tue May 02, 2021  3:19 PM)    QUEtiapine (SEROQUEL) 100 MG tablet [id number]  Take 1 tablet (100 mg total) by mouth at bedtime. Regalado, Belkys A, MD  Active   QUEtiapine (SEROQUEL) 50 MG tablet [id number]  Take 1 tablet (50 mg total) by mouth every morning.  Regalado, Belkys A, MD  Active   QUEtiapine (SEROQUEL) 50 MG tablet [id number]  Take 1 tablet (50 mg total) by mouth daily. Take in the afternoon at 2 o'clock. Regalado, Belkys A, MD  Active   rosuvastatin (CRESTOR) 20 MG tablet [id number]  Take 1 tablet by mouth once daily Alba Cory, MD  Active   sertraline  (ZOLOFT) 50 MG tablet [id number] No Take 50 mg by mouth every evening. [provider] 08/04/2023 Active Child, Pharmacy Records  thiamine (VITAMIN B1) 100 MG tablet [id number]  Take 1 tablet (100 mg total) by mouth daily. Regalado, Belkys A, MD  Active   VASCEPA 1 g capsule [id number] No Take 2 capsules (2 g total) by mouth 2 (two) times daily. Alba Cory, MD 08/04/2023 Active Child, Pharmacy Records            Home Care and Equipment/Supplies: Were Home Health Services Ordered?: NA Any new equipment or medical supplies ordered?: NA  Functional Questionnaire: Do you need assistance with bathing/showering or dressing?: Yes Do you need assistance with meal preparation?: Yes Do you need assistance with eating?: No Do you have difficulty maintaining continence: No Do you need assistance with getting out of bed/getting out of a chair/moving?: Yes Do you have difficulty managing or taking your medications?: No  Follow up appointments reviewed: PCP Follow-up appointment confirmed?: Yes Date of PCP follow-up appointment?: 09/05/23 Follow-up Provider: [facility] Follow-up appointment confirmed?: NA Do you need transportation to your follow-up appointment?: No Do you understand care options if your condition(s) worsen?: Yes-patient verbalized understanding    SIGNATURE Karena Addison, LPN [facility] Nurse Health Advisor Direct Dial [phone]

## 2023-09-05 NOTE — Progress Notes (Signed)
Name: Lindsey [name]   MRN: [id number]    DOB: Jan 18, 1951   Date:09/06/2023       Progress Note  Subjective  Chief Complaint  Follow Up  HPI  DMII:   HgbA1C today is up to 7.4 %   She does not seem to have polyphagia, polydipsia or polyuria.  She has obesity , CKI stage III and dyslipidemia. She is on statin therapy not on ARB - low bp. Taking Metformin 500 mg bid and we will add actos today since she is losing weight   Auditory Hallucination/Dementia/Depression: seen by Dr. Malvin Johns and also  by  Dr. Chrys Racer - geriatrician, who is living the practice soon . Since October 2024 seroquel dose changed to 50 mg am, 50 mg at lunch and 100 mg at night. She is still on   Aricept, and Namenda twice daily and zoloft as prescribed. Daughter thinks she is doing well on current regiment    HTN:  She denies chest pain, palpitation or dizziness. BP today is towards low end of normal, we will decrease dose again from 6.25 mg to 3.125 mg    Hyperlipidemia: taking Crestor and Vascepa, no side effects. LDL is at goal  She still lives alone, daughter manages  her bills, doctors visits, and she also dispenses her medications, she has an Aid that spends the mornings with her, family also hires an aid for the afternoon, at night either daughter or aid stays with her now. She forgets to eat or picks at her food,  but weight is trending up again.  She needs assistance bathing, dressing, taking medications, no longer cooks or cleans. She is unable to use microwave or even open pre package meals by herself. Since recent visit to [facility] and staying in a facility she needs more care and daughter is trying to get approval for 24 hours supervision so she does not need to spend the night at her house. Daughter tried to take her to her home but she is unable to sleep when not at her own home  Malnutrition: she continues to lose weight, daughter states she was not eating much at [facility] but appetite has improved since  Low potassium and  anemia during hospital stay, we will recheck labs   Patient Active Problem List   Diagnosis Date Noted   Fall 08/05/2023   Dementia due to medical condition without behavioral disturbance (HCC) 01/04/2023   RSV infection 11/15/2022   Mild protein-calorie malnutrition (HCC) 05/08/2022   Stage 3a chronic kidney disease (HCC) 05/08/2022   Type 2 diabetes mellitus with microalbuminuria, without long-term current use of insulin (HCC) 05/08/2022   Pain due to onychomycosis of toenails of both feet 05/04/2019   Dementia with behavioral disturbance (HCC) 01/09/2016   Mild dementia (HCC) 11/01/2015   Auditory hallucination 07/26/2015   Anemia of chronic disease 07/05/2015   Cataract 04/06/2015   Type 2 diabetes mellitus with diabetic cataract (HCC) 04/06/2015   Benign essential HTN 04/02/2015   Controlled gout 04/02/2015   Dyslipidemia 04/02/2015   Fibromyalgia syndrome 04/02/2015   Chronic insomnia 04/02/2015   Cognitive decline 04/02/2015   Nodular basal cell carcinoma 04/02/2015   Osteopenia 04/02/2015   Calcium blood increased 04/02/2015    Past Surgical History:  Procedure Laterality Date   CESAREAN SECTION     TONSILECTOMY, ADENOIDECTOMY, BILATERAL MYRINGOTOMY AND TUBES     TUBAL LIGATION      Family History  Problem Relation Age of Onset   Hypertension Mother  Osteoporosis Mother    Cancer Mother        brain tumor   Hodgkin's lymphoma Father    Hypertension Sister    Cancer Sister        unknown   Hypertension Sister    Diabetes Sister    Cancer Sister 97       breast   Breast cancer Sister 50   Cancer Maternal Uncle     Social History   Tobacco Use   Smoking status: Never   Smokeless tobacco: Never   Tobacco comments:    smoking cessation materials not required  Substance Use Topics   Alcohol use: No     Current Outpatient Medications:    allopurinol (ZYLOPRIM) 100 MG tablet, Take 1 tablet by mouth twice daily, Disp: 180 tablet, Rfl: 0    cholecalciferol (VITAMIN D3) 25 MCG (1000 UNIT) tablet, Take 1 tablet (1,000 Units total) by mouth daily., Disp: , Rfl:    cyanocobalamin (VITAMIN B12) 1000 MCG tablet, Take 1,000 mcg by mouth daily., Disp: , Rfl:    donepezil (ARICEPT) 10 MG tablet, Take 1 tablet (10 mg total) by mouth at bedtime. (Patient taking differently: Take 10 mg by mouth daily. In the morning), Disp: 90 tablet, Rfl: 1   feeding supplement (ENSURE ENLIVE / ENSURE PLUS) LIQD, Take 237 mLs by mouth 3 (three) times daily between meals., Disp: 237 mL, Rfl: 12   fluconazole (DIFLUCAN) 100 MG tablet, Take 1 tablet (100 mg total) by mouth daily., Disp: 5 tablet, Rfl: 0   GLUCOSE BLOOD VI, , Disp: , Rfl:    memantine (NAMENDA) 10 MG tablet, Take 10 mg by mouth 2 (two) times daily., Disp: , Rfl:    MULTIPLE VITAMIN PO, Take 1 tablet by mouth daily., Disp: , Rfl:    QUEtiapine (SEROQUEL) 100 MG tablet, Take 1 tablet (100 mg total) by mouth at bedtime., Disp: 30 tablet, Rfl: 0   QUEtiapine (SEROQUEL) 50 MG tablet, Take 1 tablet (50 mg total) by mouth daily. Take in the afternoon at 2 o'clock., Disp: 30 tablet, Rfl: 0   sertraline (ZOLOFT) 50 MG tablet, Take 50 mg by mouth every evening., Disp: , Rfl:    thiamine (VITAMIN B1) 100 MG tablet, Take 1 tablet (100 mg total) by mouth daily., Disp: 30 tablet, Rfl: 0   VASCEPA 1 g capsule, Take 2 capsules (2 g total) by mouth 2 (two) times daily., Disp: 360 capsule, Rfl: 1   carvedilol (COREG) 3.125 MG tablet, Take 1 tablet (3.125 mg total) by mouth 2 (two) times daily with a meal., Disp: 180 tablet, Rfl: 0   empagliflozin (JARDIANCE) 25 MG TABS tablet, Take 1 tablet (25 mg total) by mouth daily., Disp: 90 tablet, Rfl: 1   metFORMIN (GLUCOPHAGE-XR) 500 MG 24 hr tablet, Take 1 tablet (500 mg total) by mouth 2 (two) times daily with a meal., Disp: 180 tablet, Rfl: 1   rosuvastatin (CRESTOR) 20 MG tablet, Take 1 tablet (20 mg total) by mouth daily., Disp: 90 tablet, Rfl: 1  Allergies  Allergen  Reactions   Ace Inhibitors     I personally reviewed active problem list, medication list, allergies, family history, social history, health maintenance with the patient/caregiver today.   ROS  Ten systems reviewed and is negative except as mentioned in HPI    Objective  Vitals:   09/06/23 1153  BP: 118/72  Pulse: 98  Resp: 14  Temp: 98 F (36.7 C)  TempSrc: Oral  SpO2: 100%  Weight: 139 lb 3.2 oz (63.1 kg)  Height: 4\' 11"  (1.499 m)    Body mass index is 28.11 kg/m.  Physical Exam  Constitutional: Patient appears well-developed and well-nourished.  No distress.  HEENT: head atraumatic, normocephalic, pupils equal and reactive to light, neck supple Cardiovascular: Normal rate, regular rhythm and normal heart sounds.  No murmur heard. No BLE edema. Pulmonary/Chest: Effort normal and breath sounds normal. No respiratory distress. Abdominal: Soft.  There is no tenderness. Psychiatric: Patient has a normal mood and affect. behavior is normal. Judgment and thought content normal.    PHQ2/9:    09/06/2023   11:54 AM 05/30/2023   11:18 AM 05/06/2023   11:46 AM 01/04/2023    2:23 PM 11/21/2022    3:29 PM  Depression screen PHQ 2/9  Decreased Interest 0 0 0 0 0  Down, Depressed, Hopeless 0 0 0 0 0  PHQ - 2 Score 0 0 0 0 0  Altered sleeping 0 0 0 0 0  Tired, decreased energy 0 0 0 0 0  Change in appetite 0 0 0 0 0  Feeling bad or failure about yourself  0 0 0 0 0  Trouble concentrating 0 0 0 0 0  Moving slowly or fidgety/restless 0 0 0 0 0  Suicidal thoughts 0 0 0 0 0  PHQ-9 Score 0 0 0 0 0    phq 9 is negative   Fall Risk:    09/06/2023   11:54 AM 05/30/2023   11:18 AM 05/06/2023   11:46 AM 01/04/2023    2:23 PM 11/21/2022    3:29 PM  Fall Risk   Falls in the past year? 0 0 0 1 1  Number falls in past yr:  0  1 1  Injury with Fall?  0  0 0  Risk for fall due to : No Fall Risks No Fall Risks No Fall Risks History of fall(s) Impaired balance/gait;History of  fall(s)  Follow up Falls prevention discussed Falls prevention discussed Falls prevention discussed;Education provided;Falls evaluation completed Falls prevention discussed;Education provided;Falls evaluation completed       Assessment & Plan  1. Type 2 diabetes mellitus with microalbuminuria, without long-term current use of insulin (HCC)  - POCT HgB A1C - COMPLETE METABOLIC PANEL WITH GFR - metFORMIN (GLUCOPHAGE-XR) 500 MG 24 hr tablet; Take 1 tablet (500 mg total) by mouth 2 (two) times daily with a meal.  Dispense: 180 tablet; Refill: 1 - empagliflozin (JARDIANCE) 25 MG TABS tablet; Take 1 tablet (25 mg total) by mouth daily.  Dispense: 90 tablet; Refill: 1 - pioglitazone (ACTOS) 15 MG tablet; Take 1 tablet (15 mg total) by mouth daily.  Dispense: 90 tablet; Refill: 1  2. Dementia with behavioral disturbance (HCC)  stable  3. Mild protein-calorie malnutrition (HCC)  Discussed protein shake   4. Stage 3a chronic kidney disease (HCC)  Recheck labs, on SGL-2 agonist  5. Need for immunization against influenza  - Flu Vaccine Trivalent High Dose (Fluad)  6. Hypokalemia  - COMPLETE METABOLIC PANEL WITH GFR  7. Anemia, unspecified type  - CBC with Differential/Platelet - Iron, TIBC and Ferritin Panel  8. Dyslipidemia  - rosuvastatin (CRESTOR) 20 MG tablet; Take 1 tablet (20 mg total) by mouth daily.  Dispense: 90 tablet; Refill: 1  9. Benign essential HTN  - carvedilol (COREG) 3.125 MG tablet; Take 1 tablet (3.125 mg total) by mouth 2 (two) times daily with a meal.  Dispense: 180 tablet; Refill: 0

## 2023-09-05 NOTE — Telephone Encounter (Signed)
Copied from CRM [id number]. Topic: General - Other >> Sep 05, 2023  1:13 PM Ja-Kwan M wrote: Reason for CRM: Aurelio Jew with [facility] reports that she went out for start of care and the pt has returned to prior level of function. Pot is able to walk without assistance and dress herself. Pt has 24 hour caregiver and does not need home health care services. Cb# [phone]

## 2023-09-06 ENCOUNTER — Ambulatory Visit (INDEPENDENT_AMBULATORY_CARE_PROVIDER_SITE_OTHER): Payer: 59 | Admitting: Family Medicine

## 2023-09-06 ENCOUNTER — Encounter: Payer: Self-pay | Admitting: Family Medicine

## 2023-09-06 VITALS — BP 118/72 | HR 98 | Temp 98.0°F | Resp 14 | Ht 59.0 in | Wt 139.2 lb

## 2023-09-06 DIAGNOSIS — E876 Hypokalemia: Secondary | ICD-10-CM

## 2023-09-06 DIAGNOSIS — I10 Essential (primary) hypertension: Secondary | ICD-10-CM

## 2023-09-06 DIAGNOSIS — Z7984 Long term (current) use of oral hypoglycemic drugs: Secondary | ICD-10-CM

## 2023-09-06 DIAGNOSIS — Z23 Encounter for immunization: Secondary | ICD-10-CM | POA: Diagnosis not present

## 2023-09-06 DIAGNOSIS — F03918 Unspecified dementia, unspecified severity, with other behavioral disturbance: Secondary | ICD-10-CM

## 2023-09-06 DIAGNOSIS — R809 Proteinuria, unspecified: Secondary | ICD-10-CM

## 2023-09-06 DIAGNOSIS — E785 Hyperlipidemia, unspecified: Secondary | ICD-10-CM

## 2023-09-06 DIAGNOSIS — D649 Anemia, unspecified: Secondary | ICD-10-CM

## 2023-09-06 DIAGNOSIS — N1831 Chronic kidney disease, stage 3a: Secondary | ICD-10-CM

## 2023-09-06 DIAGNOSIS — E1129 Type 2 diabetes mellitus with other diabetic kidney complication: Secondary | ICD-10-CM

## 2023-09-06 DIAGNOSIS — E441 Mild protein-calorie malnutrition: Secondary | ICD-10-CM

## 2023-09-06 LAB — POCT GLYCOSYLATED HEMOGLOBIN (HGB A1C): Hemoglobin A1C: 7.4 % — AB (ref 4.0–5.6)

## 2023-09-06 MED ORDER — CARVEDILOL 3.125 MG PO TABS: 3.1250 mg | ORAL_TABLET | Freq: Two times a day (BID) | ORAL | 0 refills | Status: DC

## 2023-09-06 MED ORDER — METFORMIN HCL ER 500 MG PO TB24: 500.0000 mg | ORAL_TABLET | Freq: Two times a day (BID) | ORAL | 1 refills | Status: DC

## 2023-09-06 MED ORDER — ROSUVASTATIN CALCIUM 20 MG PO TABS: 20.0000 mg | ORAL_TABLET | Freq: Every day | ORAL | 1 refills | Status: DC

## 2023-09-06 MED ORDER — EMPAGLIFLOZIN 25 MG PO TABS: 25.0000 mg | ORAL_TABLET | Freq: Every day | ORAL | 1 refills | Status: DC

## 2023-09-06 MED ORDER — PIOGLITAZONE HCL 15 MG PO TABS: 15.0000 mg | ORAL_TABLET | Freq: Every day | ORAL | 1 refills | Status: DC

## 2023-09-07 LAB — CBC WITH DIFFERENTIAL/PLATELET
Absolute Lymphocytes: 1901 {cells}/uL (ref 850–3900)
Absolute Monocytes: 429 {cells}/uL (ref 200–950)
Basophils Absolute: 73 {cells}/uL (ref 0–200)
Basophils Relative: 1.1 %
Eosinophils Absolute: 132 {cells}/uL (ref 15–500)
Eosinophils Relative: 2 %
HCT: 38 % (ref 35.0–45.0)
Hemoglobin: 12 g/dL (ref 11.7–15.5)
MCH: 27.8 pg (ref 27.0–33.0)
MCHC: 31.6 g/dL — ABNORMAL LOW (ref 32.0–36.0)
MCV: 88 fL (ref 80.0–100.0)
MPV: 12.5 fL (ref 7.5–12.5)
Monocytes Relative: 6.5 %
Neutro Abs: 4066 {cells}/uL (ref 1500–7800)
Neutrophils Relative %: 61.6 %
Platelets: 223 10*3/uL (ref 140–400)
RBC: 4.32 10*6/uL (ref 3.80–5.10)
RDW: 13.6 % (ref 11.0–15.0)
Total Lymphocyte: 28.8 %
WBC: 6.6 10*3/uL (ref 3.8–10.8)

## 2023-09-07 LAB — COMPLETE METABOLIC PANEL WITH GFR
AG Ratio: 1.7 (calc) (ref 1.0–2.5)
ALT: 12 U/L (ref 6–29)
AST: 17 U/L (ref 10–35)
Albumin: 4 g/dL (ref 3.6–5.1)
Alkaline phosphatase (APISO): 75 U/L (ref 37–153)
BUN: 13 mg/dL (ref 7–25)
CO2: 32 mmol/L (ref 20–32)
Calcium: 10.2 mg/dL (ref 8.6–10.4)
Chloride: 103 mmol/L (ref 98–110)
Creat: 0.91 mg/dL (ref 0.60–1.00)
Globulin: 2.3 g/dL (ref 1.9–3.7)
Glucose, Bld: 185 mg/dL — ABNORMAL HIGH (ref 65–99)
Potassium: 4.1 mmol/L (ref 3.5–5.3)
Sodium: 142 mmol/L (ref 135–146)
Total Bilirubin: 0.5 mg/dL (ref 0.2–1.2)
Total Protein: 6.3 g/dL (ref 6.1–8.1)
eGFR: 66 mL/min/{1.73_m2} (ref >=60)

## 2023-09-07 LAB — IRON,TIBC AND FERRITIN PANEL
%SAT: 14 % — ABNORMAL LOW (ref 16–45)
Ferritin: 40 ng/mL (ref 16–288)
Iron: 51 ug/dL (ref 45–160)
TIBC: 358 ug/dL (ref 250–450)

## 2023-10-07 ENCOUNTER — Telehealth: Payer: Self-pay | Admitting: Family Medicine

## 2023-10-07 NOTE — Telephone Encounter (Signed)
Patient dropped off document  for Long Term Care Services , to be filled out by provider. Patient requested to pick it up any time in January.  Document is located in providers green folder. Marcelino Duster (daughter) can be reached on her mobile number if you have any questions.

## 2023-10-07 NOTE — Telephone Encounter (Signed)
Called primary number no answer unable to leave vm due to being full. Paperwork is filled out ready up front to be picked up.

## 2023-10-07 NOTE — Telephone Encounter (Signed)
Will review forms with Dr.Sowles and reach out to patient for any updates

## 2023-10-13 ENCOUNTER — Other Ambulatory Visit: Payer: Self-pay | Admitting: Nurse Practitioner

## 2023-10-13 DIAGNOSIS — E1129 Type 2 diabetes mellitus with other diabetic kidney complication: Secondary | ICD-10-CM

## 2023-10-17 NOTE — Telephone Encounter (Signed)
 Requested Prescriptions  Pending Prescriptions Disp Refills   metFORMIN  (GLUCOPHAGE -XR) 500 MG 24 hr tablet [Pharmacy Med Name: metFORMIN  HCl ER 500 MG Oral Tablet Extended Release 24 Hour] 120 tablet 0    Sig: TAKE 2 TABLETS BY MOUTH TWICE DAILY WITH A MEAL     Endocrinology:  Diabetes - Biguanides Failed - 10/17/2023  2:18 PM      Failed - B12 Level in normal range and within 720 days    Vitamin B-12  Date Value Ref Range Status  01/04/2023 1,817 (H) 200 - 1,100 pg/mL Final         Passed - Cr in normal range and within 360 days    Creat  Date Value Ref Range Status  09/06/2023 0.91 0.60 - 1.00 mg/dL Final   Creatinine, Urine  Date Value Ref Range Status  01/04/2023 30 20 - 275 mg/dL Final         Passed - HBA1C is between 0 and 7.9 and within 180 days    Hemoglobin A1C  Date Value Ref Range Status  09/06/2023 7.4 (A) 4.0 - 5.6 % Final   HbA1c, POC (controlled diabetic range)  Date Value Ref Range Status  04/22/2019 7.4 (A) 0.0 - 7.0 % Final   Hgb A1c MFr Bld  Date Value Ref Range Status  10/30/2019 6.9 (H) <5.7 % of total Hgb Final    Comment:    For someone without known diabetes, a hemoglobin A1c value of 6.5% or greater indicates that they may have  diabetes and this should be confirmed with a follow-up  test. . For someone with known diabetes, a value <7% indicates  that their diabetes is well controlled and a value  greater than or equal to 7% indicates suboptimal  control. A1c targets should be individualized based on  duration of diabetes, age, comorbid conditions, and  other considerations. . Currently, no consensus exists regarding use of hemoglobin A1c for diagnosis of diabetes for children. .          Passed - eGFR in normal range and within 360 days    GFR, Est African American  Date Value Ref Range Status  08/29/2020 66 > OR = 60 mL/min/1.20m2 Final   GFR, Est Non African American  Date Value Ref Range Status  08/29/2020 57 (L) > OR = 60  mL/min/1.64m2 Final   GFR, Estimated  Date Value Ref Range Status  08/16/2023 >60 >60 mL/min Final    Comment:    (NOTE) Calculated using the CKD-EPI Creatinine Equation (2021)    eGFR  Date Value Ref Range Status  09/06/2023 66 > OR = 60 mL/min/1.84m2 Final         Passed - Valid encounter within last 6 months    Recent Outpatient Visits           1 month ago Type 2 diabetes mellitus with microalbuminuria, without long-term current use of insulin  [facility])   [place] [facility] Glenard Mire, MD   4 months ago Benign essential HTN   [place] [facility] [place], Mire, MD   5 months ago Type 2 diabetes mellitus with microalbuminuria, without long-term current use of insulin  [facility])   [place] [facility] Glenard Mire, MD   9 months ago Type 2 diabetes mellitus with microalbuminuria, without long-term current use of insulin  [facility])   [facility] Glenard Mire, MD   11 months ago Hospital discharge follow-up   [facility] Glenard Mire, MD       Future Appointments             In 2 months Glenard, Krichna, MD [facility]            Passed - CBC within normal limits and completed in the last 12 months    WBC  Date Value Ref Range Status  09/06/2023 6.6 3.8 - 10.8 Thousand/uL Final   RBC  Date Value Ref Range Status  09/06/2023 4.32 3.80 - 5.10 Million/uL Final   Hemoglobin  Date Value Ref Range Status  09/06/2023 12.0 11.7 - 15.5 g/dL Final  96/97/7982 89.0 (L) 11.1 - 15.9 g/dL Final   HCT  Date Value Ref Range Status  09/06/2023 38.0 35.0 - 45.0 % Final   Hematocrit  Date Value Ref Range Status  12/15/2015 33.7 (L) 34.0 - 46.6 % Final   MCHC  Date Value Ref Range Status  09/06/2023 31.6 (L) 32.0 - 36.0 g/dL Final    Comment:    For adults, a slight decrease in the calculated MCHC value (in the range of 30 to 32  g/dL) is most likely not clinically significant; however, it should be interpreted with caution in correlation with other red cell parameters and the patient's clinical condition.    [facility]  Date Value Ref Range Status  09/06/2023 27.8 27.0 - 33.0 pg Final   MCV  Date Value Ref Range Status  09/06/2023 88.0 80.0 - 100.0 fL Final  12/15/2015 84 79 - 97 fL Final   No results found for: PLTCOUNTKUC, LABPLAT, POCPLA RDW  Date Value Ref Range Status  09/06/2023 13.6 11.0 - 15.0 % Final  12/15/2015 14.8 12.3 - 15.4 % Final          JANUVIA  50 MG tablet [Pharmacy Med Name: Januvia  50 MG Oral Tablet] 30 tablet 0    Sig: Take 1 tablet by mouth once daily     Endocrinology:  Diabetes - DPP-4 Inhibitors Passed - 10/17/2023  2:18 PM      Passed - HBA1C is between 0 and 7.9 and within 180 days    Hemoglobin A1C  Date Value Ref Range Status  09/06/2023 7.4 (A) 4.0 - 5.6 % Final   HbA1c, POC (controlled diabetic range)  Date Value Ref Range Status  04/22/2019 7.4 (A) 0.0 - 7.0 % Final   Hgb A1c MFr Bld  Date Value Ref Range Status  10/30/2019 6.9 (H) <5.7 % of total Hgb Final    Comment:    For someone without known diabetes, a hemoglobin A1c value of 6.5% or greater indicates that they may have  diabetes and this should be confirmed with a follow-up  test. . For someone with known diabetes, a value <7% indicates  that their diabetes is well controlled and a value  greater than or equal to 7% indicates suboptimal  control. A1c targets should be individualized based on  duration of diabetes, age, comorbid conditions, and  other considerations. . Currently, no consensus exists regarding use of hemoglobin A1c for diagnosis of diabetes for children. .          Passed - Cr in normal range and within 360 days    Creat  Date Value Ref Range Status  09/06/2023 0.91 0.60 - 1.00 mg/dL Final   Creatinine, Urine  Date Value Ref Range Status  01/04/2023 30 20 - 275 mg/dL  Final         Passed -  Valid encounter within last 6 months    Recent Outpatient Visits           1 month ago Type 2 diabetes mellitus with microalbuminuria, without long-term current use of insulin  [facility])   [place] [facility] Glenard Mire, MD   4 months ago Benign essential HTN   [place] [facility], Mire, MD   5 months ago Type 2 diabetes mellitus with microalbuminuria, without long-term current use of insulin  [facility])   [place] [facility] Glenard Mire, MD   9 months ago Type 2 diabetes mellitus with microalbuminuria, without long-term current use of insulin  [facility])   [facility] Sowles, Krichna, MD   11 months ago Hospital discharge follow-up   [facility] Glenard Mire, MD       Future Appointments             In 2 months Sowles, Krichna, MD [facility]

## 2023-10-27 ENCOUNTER — Other Ambulatory Visit: Payer: Self-pay | Admitting: Family Medicine

## 2023-10-27 DIAGNOSIS — E1129 Type 2 diabetes mellitus with other diabetic kidney complication: Secondary | ICD-10-CM

## 2023-10-27 DIAGNOSIS — M109 Gout, unspecified: Secondary | ICD-10-CM

## 2023-11-15 NOTE — Telephone Encounter (Signed)
Forms in PCP folder to review and sign

## 2023-11-20 ENCOUNTER — Ambulatory Visit (INDEPENDENT_AMBULATORY_CARE_PROVIDER_SITE_OTHER): Payer: 59 | Admitting: Podiatry

## 2023-11-20 ENCOUNTER — Encounter: Payer: Self-pay | Admitting: Podiatry

## 2023-11-20 DIAGNOSIS — B351 Tinea unguium: Secondary | ICD-10-CM | POA: Diagnosis not present

## 2023-11-20 DIAGNOSIS — M79674 Pain in right toe(s): Secondary | ICD-10-CM

## 2023-11-20 DIAGNOSIS — M79675 Pain in left toe(s): Secondary | ICD-10-CM | POA: Diagnosis not present

## 2023-11-20 NOTE — Progress Notes (Signed)
  Subjective:  Patient ID: Lindsey [name], female    DOB: Jun 18, 1951,  MRN: [id number]  Chief Complaint  Patient presents with   Diabetes    She has some ingrown toenails and they need to be clipped.  That toenail has put a place on her toe.    73 y.o. female presents with the above complaint. History confirmed with patient.   Objective:  Physical Exam: warm, good capillary refill, no trophic changes or ulcerative lesions, normal DP and PT pulses, and normal sensory exam. Left Foot: dystrophic yellowed discolored nail plates with subungual debris and ingrowing left second medial border, medial second PIPJ has scab that is healed with no signs of infection or open ulceration Right Foot: dystrophic yellowed discolored nail plates with subungual debris   Assessment:   1. Pain due to onychomycosis of toenails of both feet      Plan:  Patient was evaluated and treated and all questions answered.  Discussed the etiology and treatment options for the condition in detail with the patient.  Recommended debridement of the nails today. Sharp and mechanical debridement performed of all painful and mycotic nails today. Nails debrided in length and thickness using a nail nipper to level of comfort.  Diabetes is well-controlled.  Expect this should resolve uneventfully.  I recommended doing Epsom salt and warm water soaks for 15 to 20 minutes once a week for the next week.  Notify me if signs of infection develop but should heal uneventfully at this point.    Return in about 3 months (around 02/17/2024) for at risk diabetic foot care.

## 2023-11-30 ENCOUNTER — Other Ambulatory Visit: Payer: Self-pay | Admitting: Family Medicine

## 2023-11-30 DIAGNOSIS — I10 Essential (primary) hypertension: Secondary | ICD-10-CM

## 2023-12-02 ENCOUNTER — Encounter: Payer: Self-pay | Admitting: Family Medicine

## 2023-12-26 ENCOUNTER — Telehealth: Payer: Self-pay

## 2023-12-26 NOTE — Telephone Encounter (Signed)
 A user error has taken place: orders placed in error, not carried out on this patient.

## 2024-01-06 ENCOUNTER — Ambulatory Visit: Payer: 59 | Admitting: Family Medicine

## 2024-01-06 ENCOUNTER — Other Ambulatory Visit: Payer: Self-pay | Admitting: Family Medicine

## 2024-01-06 ENCOUNTER — Encounter: Payer: Self-pay | Admitting: Family Medicine

## 2024-01-06 VITALS — BP 126/78 | HR 104 | Resp 18 | Ht 59.0 in | Wt 138.8 lb

## 2024-01-06 DIAGNOSIS — E785 Hyperlipidemia, unspecified: Secondary | ICD-10-CM

## 2024-01-06 DIAGNOSIS — E1129 Type 2 diabetes mellitus with other diabetic kidney complication: Secondary | ICD-10-CM | POA: Diagnosis not present

## 2024-01-06 DIAGNOSIS — E559 Vitamin D deficiency, unspecified: Secondary | ICD-10-CM

## 2024-01-06 DIAGNOSIS — E441 Mild protein-calorie malnutrition: Secondary | ICD-10-CM

## 2024-01-06 DIAGNOSIS — N1831 Chronic kidney disease, stage 3a: Secondary | ICD-10-CM

## 2024-01-06 DIAGNOSIS — R809 Proteinuria, unspecified: Secondary | ICD-10-CM

## 2024-01-06 DIAGNOSIS — I10 Essential (primary) hypertension: Secondary | ICD-10-CM

## 2024-01-06 DIAGNOSIS — M109 Gout, unspecified: Secondary | ICD-10-CM

## 2024-01-06 DIAGNOSIS — F03918 Unspecified dementia, unspecified severity, with other behavioral disturbance: Secondary | ICD-10-CM

## 2024-01-06 DIAGNOSIS — E538 Deficiency of other specified B group vitamins: Secondary | ICD-10-CM

## 2024-01-06 LAB — POCT GLYCOSYLATED HEMOGLOBIN (HGB A1C): Hemoglobin A1C: 8 % — AB (ref 4.0–5.6)

## 2024-01-06 MED ORDER — PIOGLITAZONE HCL 15 MG PO TABS: 15.0000 mg | ORAL_TABLET | Freq: Every day | ORAL | 1 refills | Status: DC

## 2024-01-06 MED ORDER — METFORMIN HCL ER 500 MG PO TB24: 500.0000 mg | ORAL_TABLET | Freq: Two times a day (BID) | ORAL | 1 refills | Status: DC

## 2024-01-06 MED ORDER — CARVEDILOL 3.125 MG PO TABS: 3.1250 mg | ORAL_TABLET | Freq: Two times a day (BID) | ORAL | 1 refills | Status: DC

## 2024-01-06 MED ORDER — ROSUVASTATIN CALCIUM 20 MG PO TABS: 20.0000 mg | ORAL_TABLET | Freq: Every day | ORAL | 1 refills | Status: DC

## 2024-01-06 MED ORDER — EMPAGLIFLOZIN 25 MG PO TABS: 25.0000 mg | ORAL_TABLET | Freq: Every day | ORAL | 1 refills | Status: DC

## 2024-01-06 MED ORDER — LOSARTAN POTASSIUM 25 MG PO TABS: 25.0000 mg | ORAL_TABLET | Freq: Every day | ORAL | 1 refills | Status: DC

## 2024-01-06 NOTE — Patient Instructions (Addendum)
 Glucerna  Stop sweet beverages   Get Shingrix and RSV only at local pharmacy

## 2024-01-06 NOTE — Progress Notes (Signed)
 Name: Lindsey [name]   MRN: [id number]    DOB: [birth year]   Date:01/06/2024       Progress Note  Subjective  Chief Complaint  Chief Complaint  Patient presents with   Medical Management of Chronic Issues   Hypertension   Diabetes   Hyperlipidemia   HPI   She came in with her Margaretha Glassing - cousin   DMII: HgbA1C today is up from  7.4 % to 8%   She does not seem to have polyphagia, polydipsia or polyuria. She has CKI stage III and dyslipidemia. She is on statin therapy and is back on ARB . Taking Metformin 500 mg bid and but not taking Actos as prescribed on her last visit. She has been drinking juice also advised to get Glucerna instead of Boost    Auditory Hallucination/Dementia/Depression: seen by Dr. Malvin Johns and also a geriatrician  She still lives alone, daughter manages  her bills, doctors visits, and she also dispenses her medications, she has an Aid that spends the mornings with her, family also hires an aid for the afternoon, at night either daughter or aid stays with her now. She forgets to eat or picks at her food, weight has finally stabilized. She needs assistance bathing, dressing, taking medications, no longer cooks or cleans. She is unable to use microwave or even open pre package meals by herself. Since recent visit to [facility] and staying in a facility she needs more care and daughter is trying to get approval for 24 hours supervision so she does not need to spend the night at her house. Daughter tried to take her to her home but she is unable to sleep when not at her own home. She seems to be progressing, she has to take seroquel 50 in , 150 at 2 and 100 mg at night due to sundowning . Explained she cannot be left at her house alone. Must have 24 hour care. Explained that living her mother at home without supervision is like leaving a 4-5 year child without a caregiver. She will not be able to leave if there is a fire or an accident happens   HTN/CKI stage IIIa:  She denies chest pain,  palpitation or dizziness. BP today is towards low end of normal,, she is back on losartan 25 mg and is taking carvedilol 3.125 mg bid. Denies pruritus, still has good urine output   Hyperlipidemia: taking Crestor and Vascepa, no side effects. LDL is at goal, she is unable to swallow vascepa and we will stop medication since she has advanced dementia and comfort is more important at this time   Malnutrition: she continues to lose weight, daughter states she was not eating much , caregivers have to motivate her to eat, advised to switch from Ensure to Glucerna due to increase in her A1C level. Stop fruit juices, choose high caloric meals with low sugar like cottage cheese with non added sugar apple sauce, cheese, peanut butter ( without added sugar) , sweet potatoes..    Patient Active Problem List   Diagnosis Date Noted   Fall 08/05/2023   Dementia due to medical condition without behavioral disturbance (HCC) 01/04/2023   RSV infection 11/15/2022   Mild protein-calorie malnutrition (HCC) 05/08/2022   Stage 3a chronic kidney disease (HCC) 05/08/2022   Type 2 diabetes mellitus with microalbuminuria, without long-term current use of insulin (HCC) 05/08/2022   Pain due to onychomycosis of toenails of both feet 05/04/2019   Dementia with behavioral disturbance (HCC) 01/09/2016  Mild dementia (HCC) 11/01/2015   Auditory hallucination 07/26/2015   Anemia of chronic disease 07/05/2015   Cataract 04/06/2015   Type 2 diabetes mellitus with diabetic cataract (HCC) 04/06/2015   Benign essential HTN 04/02/2015   Controlled gout 04/02/2015   Dyslipidemia 04/02/2015   Fibromyalgia syndrome 04/02/2015   Chronic insomnia 04/02/2015   Cognitive decline 04/02/2015   Nodular basal cell carcinoma 04/02/2015   Osteopenia 04/02/2015   Calcium blood increased 04/02/2015    Past Surgical History:  Procedure Laterality Date   CESAREAN SECTION     TONSILECTOMY, ADENOIDECTOMY, BILATERAL MYRINGOTOMY AND TUBES      TUBAL LIGATION      Family History  Problem Relation Age of Onset   Hypertension Mother    Osteoporosis Mother    Cancer Mother        brain tumor   Hodgkin's lymphoma Father    Hypertension Sister    Cancer Sister        unknown   Hypertension Sister    Diabetes Sister    Cancer Sister 69       breast   Breast cancer Sister 21   Cancer Maternal Uncle     Social History   Tobacco Use   Smoking status: Never   Smokeless tobacco: Never   Tobacco comments:    smoking cessation materials not required  Substance Use Topics   Alcohol use: No     Current Outpatient Medications:    allopurinol (ZYLOPRIM) 100 MG tablet, Take 1 tablet by mouth twice daily, Disp: 180 tablet, Rfl: 0   cholecalciferol (VITAMIN D3) 25 MCG (1000 UNIT) tablet, Take 1 tablet (1,000 Units total) by mouth daily., Disp: , Rfl:    cyanocobalamin (VITAMIN B12) 1000 MCG tablet, Take 1,000 mcg by mouth daily., Disp: , Rfl:    donepezil (ARICEPT) 10 MG tablet, Take 1 tablet (10 mg total) by mouth at bedtime. (Patient taking differently: Take 10 mg by mouth daily. In the morning), Disp: 90 tablet, Rfl: 1   feeding supplement (ENSURE ENLIVE / ENSURE PLUS) LIQD, Take 237 mLs by mouth 3 (three) times daily between meals., Disp: 237 mL, Rfl: 12   GLUCOSE BLOOD VI, , Disp: , Rfl:    losartan (COZAAR) 25 MG tablet, Take 1 tablet (25 mg total) by mouth daily., Disp: 90 tablet, Rfl: 1   memantine (NAMENDA) 10 MG tablet, Take 10 mg by mouth 2 (two) times daily., Disp: , Rfl:    MULTIPLE VITAMIN PO, Take 1 tablet by mouth daily., Disp: , Rfl:    pioglitazone (ACTOS) 15 MG tablet, Take 1 tablet (15 mg total) by mouth daily., Disp: 90 tablet, Rfl: 1   QUEtiapine (SEROQUEL) 100 MG tablet, Take 1 tablet (100 mg total) by mouth at bedtime., Disp: 30 tablet, Rfl: 0   sertraline (ZOLOFT) 50 MG tablet, Take 50 mg by mouth every evening., Disp: , Rfl:    carvedilol (COREG) 3.125 MG tablet, Take 1 tablet (3.125 mg total) by  mouth 2 (two) times daily with a meal., Disp: 180 tablet, Rfl: 1   empagliflozin (JARDIANCE) 25 MG TABS tablet, Take 1 tablet (25 mg total) by mouth daily., Disp: 90 tablet, Rfl: 1   metFORMIN (GLUCOPHAGE-XR) 500 MG 24 hr tablet, Take 1 tablet (500 mg total) by mouth 2 (two) times daily with a meal., Disp: 180 tablet, Rfl: 1   rosuvastatin (CRESTOR) 20 MG tablet, Take 1 tablet (20 mg total) by mouth daily., Disp: 90 tablet, Rfl: 1  Allergies  Allergen Reactions   Ace Inhibitors     I personally reviewed active problem list, medication list, allergies with the patient/caregiver today.   ROS  Ten systems reviewed and is negative except as mentioned in HPI    Objective  Vitals:   01/06/24 1517  BP: 126/78  Pulse: (!) 104  Resp: 18  SpO2: 98%  Weight: 138 lb 12.8 oz (63 kg)  Height: 4\' 11"  (1.499 m)    Body mass index is 28.03 kg/m.  Physical Exam  Constitutional: Patient appears well-developed and malnourished .  No distress.  HEENT: head atraumatic, normocephalic, pupils equal and reactive to light, neck supple Cardiovascular: Normal rate, regular rhythm and normal heart sounds.  No murmur heard. No BLE edema. Pulmonary/Chest: Effort normal and breath sounds normal. No respiratory distress. Abdominal: Soft.  There is no tenderness. Psychiatric: cooperative, blank stare.   Diabetic Foot Exam:     PHQ2/9:    01/06/2024    3:18 PM 09/06/2023   11:54 AM 05/30/2023   11:18 AM 05/06/2023   11:46 AM 01/04/2023    2:23 PM  Depression screen PHQ 2/9  Decreased Interest 0 0 0 0 0  Down, Depressed, Hopeless 0 0 0 0 0  PHQ - 2 Score 0 0 0 0 0  Altered sleeping 0 0 0 0 0  Tired, decreased energy 0 0 0 0 0  Change in appetite 0 0 0 0 0  Feeling bad or failure about yourself  0 0 0 0 0  Trouble concentrating 0 0 0 0 0  Moving slowly or fidgety/restless 0 0 0 0 0  Suicidal thoughts 0 0 0 0 0  PHQ-9 Score 0 0 0 0 0  Difficult doing work/chores Not difficult at all         phq 9 is negative  Fall Risk:    01/06/2024    3:18 PM 09/06/2023   11:54 AM 05/30/2023   11:18 AM 05/06/2023   11:46 AM 01/04/2023    2:23 PM  Fall Risk   Falls in the past year? 0 0 0 0 1  Number falls in past yr: 0  0  1  Injury with Fall? 0  0  0  Risk for fall due to :  No Fall Risks No Fall Risks No Fall Risks History of fall(s)  Follow up Falls evaluation completed Falls prevention discussed Falls prevention discussed Falls prevention discussed;Education provided;Falls evaluation completed Falls prevention discussed;Education provided;Falls evaluation completed     Assessment & Plan  1. Type 2 diabetes mellitus with microalbuminuria, without long-term current use of insulin (HCC) (Primary)  - POCT HgB A1C - metFORMIN (GLUCOPHAGE-XR) 500 MG 24 hr tablet; Take 1 tablet (500 mg total) by mouth 2 (two) times daily with a meal.  Dispense: 180 tablet; Refill: 1 - pioglitazone (ACTOS) 15 MG tablet; Take 1 tablet (15 mg total) by mouth daily.  Dispense: 90 tablet; Refill: 1 - empagliflozin (JARDIANCE) 25 MG TABS tablet; Take 1 tablet (25 mg total) by mouth daily.  Dispense: 90 tablet; Refill: 1 - losartan (COZAAR) 25 MG tablet; Take 1 tablet (25 mg total) by mouth daily.  Dispense: 90 tablet; Refill: 1  2. Stage 3a chronic kidney disease (HCC)  - empagliflozin (JARDIANCE) 25 MG TABS tablet; Take 1 tablet (25 mg total) by mouth daily.  Dispense: 90 tablet; Refill: 1 - losartan (COZAAR) 25 MG tablet; Take 1 tablet (25 mg total) by mouth daily.  Dispense: 90 tablet; Refill: 1  3.  Dementia with behavioral disturbance [facility])  Seeing neurologist  4. Mild protein-calorie malnutrition (HCC)  Change to Glucerna  5. Dyslipidemia  - Lipid panel - rosuvastatin (CRESTOR) 20 MG tablet; Take 1 tablet (20 mg total) by mouth daily.  Dispense: 90 tablet; Refill: 1  6. Controlled gout  Continue medication   7. B12 deficiency  - B12 and Folate Panel  8. Benign essential HTN  -  carvedilol (COREG) 3.125 MG tablet; Take 1 tablet (3.125 mg total) by mouth 2 (two) times daily with a meal.  Dispense: 180 tablet; Refill: 1  9. Vitamin D deficiency  - VITAMIN D 25 Hydroxy (Vit-D Deficiency, Fractures)

## 2024-01-07 ENCOUNTER — Encounter: Payer: Self-pay | Admitting: Family Medicine

## 2024-01-07 LAB — LIPID PANEL
Cholesterol: 110 mg/dL (ref <200)
HDL: 42 mg/dL — ABNORMAL LOW (ref >=50)
LDL Cholesterol (Calc): 45 mg/dL
Non-HDL Cholesterol (Calc): 68 mg/dL (ref <130)
Total CHOL/HDL Ratio: 2.6 (calc) (ref <5.0)
Triglycerides: 144 mg/dL (ref <150)

## 2024-01-07 LAB — VITAMIN D 25 HYDROXY (VIT D DEFICIENCY, FRACTURES): Vit D, 25-Hydroxy: 59 ng/mL (ref 30–100)

## 2024-01-07 LAB — B12 AND FOLATE PANEL
Folate: 20.4 ng/mL
Vitamin B-12: >2000 pg/mL — ABNORMAL HIGH (ref 200–1100)

## 2024-01-07 NOTE — Addendum Note (Signed)
 Addended by: Alba Cory F on: 01/07/2024 12:44 PM   Modules accepted: Level of Service

## 2024-01-09 LAB — MICROALBUMIN / CREATININE URINE RATIO
Creatinine, Urine: 48 mg/dL (ref 20–275)
Microalb Creat Ratio: 69 mg/g{creat} — ABNORMAL HIGH (ref <30)
Microalb, Ur: 3.3 mg/dL

## 2024-01-24 ENCOUNTER — Other Ambulatory Visit: Payer: Self-pay | Admitting: Family Medicine

## 2024-01-24 DIAGNOSIS — M109 Gout, unspecified: Secondary | ICD-10-CM

## 2024-02-12 LAB — HM DIABETES EYE EXAM

## 2024-02-20 ENCOUNTER — Encounter: Payer: Self-pay | Admitting: Podiatry

## 2024-02-20 ENCOUNTER — Ambulatory Visit (INDEPENDENT_AMBULATORY_CARE_PROVIDER_SITE_OTHER): Payer: 59 | Admitting: Podiatry

## 2024-02-20 DIAGNOSIS — B351 Tinea unguium: Secondary | ICD-10-CM | POA: Diagnosis not present

## 2024-02-20 DIAGNOSIS — M79675 Pain in left toe(s): Secondary | ICD-10-CM | POA: Diagnosis not present

## 2024-02-20 DIAGNOSIS — M79674 Pain in right toe(s): Secondary | ICD-10-CM

## 2024-02-20 NOTE — Patient Instructions (Signed)
 To Family of Lindsey [name]:  Nike slip on sneakers are a little snug due to the neon green elastic material. This is causing indentions in her skin. No open wounds noted today. Recommend Skechers Slip ins with stretchable uppers.   She will follow up with us  in 3 months.   Respectfully,  Dr. Luella Sager

## 2024-02-20 NOTE — Progress Notes (Signed)
  Subjective:  Patient ID: Lindsey [name], female    DOB: November 29, 1950,  MRN: [id number]  73 y.o. female presents painful elongated mycotic toenails 1-5 bilaterally which are tender when wearing enclosed shoe gear. Pain is relieved with periodic professional debridement. Her caregiver is present on today's visit. Caregiver has concern current pair of shoes may be too tight. Chief Complaint  Patient presents with   Diabetes    "Trim her toenails."  Dr. Arleen Lacer - 01/06/2024; A1c - 8.0    New problem(s): None   PCP is Arleen Lacer, MD , and last visit was .  Allergies  Allergen Reactions   Ace Inhibitors    Review of Systems: Negative except as noted in the HPI.   Objective:  Lindsey [name] is a pleasant 73 y.o. female WD, WN in NAD. AAO x 3.  Vascular Examination: Vascular status intact b/l with palpable pedal pulses. CFT immediate b/l. Pedal hair present. Trace edema b/l feet. No pain with calf compression b/l. Skin temperature gradient WNL b/l. No varicosities noted. No cyanosis or clubbing noted.  Neurological Examination: Sensation grossly intact b/l with 10 gram monofilament. Vibratory sensation intact b/l.  Dermatological Examination: Pedal skin with normal turgor, texture and tone b/l. No open wounds nor interdigital macerations noted. Toenails 1-5 b/l thick, discolored, elongated with subungual debris and pain on dorsal palpation. No hyperkeratotic lesions noted b/l.   Musculoskeletal Examination: Current pair of shoe gear has neon green bungee threads which are causing an indentation of her feet as she has some trace edema of both feet. Muscle strength 5/5 to b/l LE.  No pain, crepitus noted b/l. No gross pedal deformities. Patient ambulates independently without assistive aids.   Radiographs: None  Last A1c:      Latest Ref Rng & Units 01/06/2024    3:45 PM 09/06/2023   11:56 AM 05/06/2023   11:49 AM  Hemoglobin A1C  Hemoglobin-A1c 4.0 - 5.6 % 8.0  7.4  7.2     Assessment:   1. Pain due to onychomycosis of toenails of both feet    Plan:  -Caregiver/provider present with patient on today's visit. -Consent given for treatment as described below: -Examined patient. -Toenails 1-5 b/l were debrided in length and girth with sterile nail nippers and dremel without iatrogenic bleeding.  -Sent printed communication addressed to family regarding current pair of sneakers. Recommended Skechers slip ins with stretchable uppers. -Patient/POA to call should there be question/concern in the interim.  Return in about 3 months (around 05/22/2024).  Luella Sager, DPM      [place] LOCATION: [address] [place] [address]                   Office [phone]   [facility] LOCATION: [address] [place], [place] [address] Office [phone]

## 2024-02-24 ENCOUNTER — Encounter: Payer: Self-pay | Admitting: Podiatry

## 2024-04-16 ENCOUNTER — Ambulatory Visit: Admitting: Nurse Practitioner

## 2024-04-16 ENCOUNTER — Ambulatory Visit: Payer: Self-pay

## 2024-04-16 ENCOUNTER — Encounter: Payer: Self-pay | Admitting: Nurse Practitioner

## 2024-04-16 VITALS — BP 140/82 | HR 98 | Temp 98.8°F | Resp 18 | Ht 59.0 in

## 2024-04-16 DIAGNOSIS — R443 Hallucinations, unspecified: Secondary | ICD-10-CM | POA: Diagnosis not present

## 2024-04-16 LAB — POCT URINALYSIS DIPSTICK
Bilirubin, UA: NEGATIVE
Glucose, UA: NEGATIVE
Ketones, UA: NEGATIVE
Leukocytes, UA: NEGATIVE
Nitrite, UA: NEGATIVE
Odor: NORMAL
Protein, UA: NEGATIVE
Spec Grav, UA: 1.02 (ref 1.010–1.025)
Urobilinogen, UA: 0.2 U/dL
pH, UA: 6.5 (ref 5.0–8.0)

## 2024-04-16 NOTE — Progress Notes (Signed)
 BP (!) 140/82   Pulse 98   Temp 98.8 F (37.1 C)   Resp 18   Ht 4' 11 (1.499 m)   SpO2 98%   BMI 28.03 kg/m    Subjective:    Patient ID: Lindsey [name], female    DOB: 1951/08/19, 73 y.o.   MRN: [id number]  HPI: Lindsey [name] is a 73 y.o. female  Chief Complaint  Patient presents with   Hallucinations    W/ urinary symptoms cloudy and odor    Discussed the use of AI scribe software for clinical note transcription with the patient, who gave verbal consent to proceed.  History of Present Illness Lindsey [name] is a 73 year old female with auditory hallucinations and dementia who presents with increased hallucinations and possible urinary tract infection with care giver.  She has a history of auditory hallucinations and dementia, with a recent increase in hallucinations beyond her baseline, characterized by talking to things that are not there.  There is a concern for a urinary tract infection as her urine was noted to be cloudy with a slight smell this morning. Last week, her aide suspected a UTI, but the urine was clear and had no smell at that time. A urine dip test showed no signs of infection.  She experienced a fall from bed this morning and was found wet. There is a noted difficulty in her getting up, although she ate a snack before the visit. She denies any injury.   She received pneumonia and shingles vaccinations on the previous Saturday.  No pain, recent illness, or nervousness.  No changes in her urine output. No fever.         01/06/2024    3:18 PM 09/06/2023   11:54 AM 05/30/2023   11:18 AM  Depression screen PHQ 2/9  Decreased Interest 0 0 0  Down, Depressed, Hopeless 0 0 0  PHQ - 2 Score 0 0 0  Altered sleeping 0 0 0  Tired, decreased energy 0 0 0  Change in appetite 0 0 0  Feeling bad or failure about yourself  0 0 0  Trouble concentrating 0 0 0  Moving slowly or fidgety/restless 0 0 0  Suicidal thoughts 0 0 0  PHQ-9 Score 0 0 0  Difficult  doing work/chores Not difficult at all      Relevant past medical, surgical, family and social history reviewed and updated as indicated. Interim medical history since our last visit reviewed. Allergies and medications reviewed and updated.  Review of Systems  Ten systems reviewed and is negative except as mentioned in HPI      Objective:     BP (!) 140/82   Pulse 98   Temp 98.8 F (37.1 C)   Resp 18   Ht 4' 11 (1.499 m)   SpO2 98%   BMI 28.03 kg/m    Wt Readings from Last 3 Encounters:  01/06/24 138 lb 12.8 oz (63 kg)  09/06/23 139 lb 3.2 oz (63.1 kg)  08/05/23 145 lb (65.8 kg)    Physical Exam Physical Exam VITALS: BP- 168/ GENERAL: Alert, cooperative, well developed, no acute distress. HEENT: Normocephalic, normal oropharynx, moist mucous membranes. CHEST: Clear to auscultation bilaterally, no wheezes, rhonchi, or crackles. CARDIOVASCULAR: Normal heart rate and rhythm, S1 and S2 normal without murmurs. ABDOMEN: Soft, no tenderness on palpation, non-distended, without organomegaly, normal bowel sounds. NO CVA tenderness EXTREMITIES: No cyanosis or edema. NEUROLOGICAL: Cranial nerves grossly intact, moves all extremities without  gross motor or sensory deficit, motor function intact.   Results for orders placed or performed in visit on 04/16/24  POCT Urinalysis Dipstick   Collection Time: 04/16/24  3:46 PM  Result Value Ref Range   Color, UA yellow    Clarity, UA clear    Glucose, UA Negative Negative   Bilirubin, UA neg    Ketones, UA neg    Spec Grav, UA 1.020 1.010 - 1.025   Blood, UA trace    pH, UA 6.5 5.0 - 8.0   Protein, UA Negative Negative   Urobilinogen, UA 0.2 0.2 or 1.0 E.U./dL   Nitrite, UA neg    Leukocytes, UA Negative Negative   Appearance clear    Odor normal           Assessment & Plan:   Problem List Items Addressed This Visit   None Visit Diagnoses       Hallucinations    -  Primary   Relevant Orders   POCT Urinalysis  Dipstick (Completed)        Assessment and Plan Assessment & Plan Dementia with worsening auditory hallucinations Worsening auditory hallucinations with increased frequency. No clear etiology identified. Urine dip negative for infection. Blood sugar checked to rule out hypoglycemia. - Monitor for changes in mental status. -recommend going to er,  caregiver declined due to prior experience at [facility]. - Consider alternative hospital if emergency care is needed.  Recent fall Fall from bed, possibly related to altered mental status or hallucinations. No acute injuries reported. Urine dip negative for infection. Blood sugar levels checked to rule out hypoglycemia. - Monitor for signs of injury or complications from the fall.        Follow up plan: Return if symptoms worsen or fail to improve.

## 2024-04-16 NOTE — Telephone Encounter (Signed)
 FYI Only or Action Required?: FYI only for provider.  Patient was last seen in primary care on 01/06/2024 by Glenard Mire, MD. Called Nurse Triage reporting No chief complaint on file.. Symptoms began yesterday. Interventions attempted: Nothing. Symptoms are: rapidly worsening.  Triage Disposition: See HCP Within 4 Hours (Or PCP Triage)  Patient/caregiver understands and will follow disposition?: Yes   Copied from CRM [id number]. Topic: Clinical - Red Word Triage >> Apr 16, 2024  2:48 PM Donee H wrote: [facility] that prompted transfer to Nurse Triage: Patient's daughter Rosaline calling in stating patient is delirious, having trouble walking and urine is cloudy.  Reason for Disposition  [1] Longstanding confusion (e.g., dementia, stroke) AND [2] worsening  Answer Assessment - Initial Assessment Questions 1. LEVEL OF CONSCIOUSNESS: How is he (she, the patient) acting right now? (e.g., alert-oriented, confused, lethargic, stuporous, comatose)     Hallucinations  2. ONSET: When did the confusion start?  (minutes, hours, days)     Last night to today  3. PATTERN Does this come and go, or has it been constant since it started?  Is it present now?     Constant  4. ALCOHOL or DRUGS: Has he been drinking alcohol or taking any drugs?    No     5. NARCOTIC MEDICINES: Has he been receiving any narcotic medications? (e.g., morphine, Vicodin)     No  6. CAUSE: What do you think is causing the confusion?      Unsure  7. OTHER SYMPTOMS: Are there any other symptoms? (e.g., difficulty breathing, headache, fever, weakness)     Urine cloudy, malodorous, Gait Changes (Standing is difficult)  Protocols used: Confusion - Delirium-A-AH

## 2024-04-16 NOTE — Telephone Encounter (Signed)
 Called phone number on file, mailbox full cannot except calls

## 2024-04-16 NOTE — Telephone Encounter (Signed)
Appointment at 3.20 today

## 2024-04-17 LAB — URINE CULTURE
MICRO NUMBER:: 16659202
Result:: NO GROWTH
SPECIMEN QUALITY:: ADEQUATE

## 2024-04-20 ENCOUNTER — Ambulatory Visit: Payer: Self-pay | Admitting: Nurse Practitioner

## 2024-04-22 ENCOUNTER — Other Ambulatory Visit: Payer: Self-pay | Admitting: Family Medicine

## 2024-04-22 DIAGNOSIS — M109 Gout, unspecified: Secondary | ICD-10-CM

## 2024-04-22 LAB — HEMOGLOBIN A1C: Hemoglobin A1C: 7.5

## 2024-05-01 ENCOUNTER — Telehealth: Payer: Self-pay

## 2024-05-01 ENCOUNTER — Encounter: Payer: Self-pay | Admitting: Family Medicine

## 2024-05-01 ENCOUNTER — Ambulatory Visit (INDEPENDENT_AMBULATORY_CARE_PROVIDER_SITE_OTHER): Admitting: Family Medicine

## 2024-05-01 VITALS — BP 134/74 | HR 99 | Resp 16 | Ht 59.0 in | Wt 140.5 lb

## 2024-05-01 DIAGNOSIS — R809 Proteinuria, unspecified: Secondary | ICD-10-CM

## 2024-05-01 DIAGNOSIS — F03918 Unspecified dementia, unspecified severity, with other behavioral disturbance: Secondary | ICD-10-CM | POA: Diagnosis not present

## 2024-05-01 DIAGNOSIS — N281 Cyst of kidney, acquired: Secondary | ICD-10-CM

## 2024-05-01 DIAGNOSIS — E1129 Type 2 diabetes mellitus with other diabetic kidney complication: Secondary | ICD-10-CM

## 2024-05-01 DIAGNOSIS — N1831 Chronic kidney disease, stage 3a: Secondary | ICD-10-CM

## 2024-05-01 DIAGNOSIS — K862 Cyst of pancreas: Secondary | ICD-10-CM

## 2024-05-01 DIAGNOSIS — K5909 Other constipation: Secondary | ICD-10-CM

## 2024-05-01 DIAGNOSIS — I739 Peripheral vascular disease, unspecified: Secondary | ICD-10-CM | POA: Diagnosis not present

## 2024-05-01 DIAGNOSIS — I700 Atherosclerosis of aorta: Secondary | ICD-10-CM | POA: Diagnosis not present

## 2024-05-01 DIAGNOSIS — I10 Essential (primary) hypertension: Secondary | ICD-10-CM

## 2024-05-01 DIAGNOSIS — E559 Vitamin D deficiency, unspecified: Secondary | ICD-10-CM

## 2024-05-01 MED ORDER — POLYETHYLENE GLYCOL 3350 17 GM/SCOOP PO POWD: 17.0000 g | Freq: Every day | ORAL | 1 refills | Status: AC

## 2024-05-01 NOTE — Telephone Encounter (Signed)
 Copied from CRM [id number]. Topic: Clinical - Home Health Verbal Orders >> May 01, 2024 10:28 AM Antwanette L wrote: Caller/Agency: Katie from [facility] Callback Number: [phone] Service Requested: Physical Therapy Frequency: 1x  for one week and 2x for 3 weeks  Any new concerns about the patient? No.

## 2024-05-01 NOTE — Progress Notes (Signed)
 Name: Lindsey [name]   MRN: [id number]    DOB: 06-01-1951   Date:05/01/2024       Progress Note  Subjective  Chief Complaint  Chief Complaint  Patient presents with   Medical Management of Chronic Issues   Discussed the use of AI scribe software for clinical note transcription with the patient, who gave verbal consent to proceed.  History of Present Illness Lindsey [name] is a 73 year old female with dementia and chronic kidney disease who presents with weakness and falls. She is accompanied by her caregiver.  She experienced atypical weakness in her legs, leading to multiple falls, including slipping out of bed twice. On the third occasion, her caregiver sought medical attention. Initial urine tests for a urinary tract infection were negative, but due to persistent symptoms, she was admitted to [facility] from July 8th to July 12th.  During her hospital stay, her blood pressure was initially high but later stabilized. She experienced confusion and auditory hallucinations, with worsening symptoms of dementia. A repeat urine culture was conducted due to contamination, and she was treated with ciprofloxacin 500 mg for two days for a possible UTI. She also received Senokot S for constipation, taking two tablets by mouth once a day.  Additional diagnostic workup included a lipase test, thyroid  function test, CBC, and A1c, which showed improvement in diabetes management. Imaging studies revealed cystic pancreatic lesions, thyroid  nodules, and a large cyst on the right kidney. A CT scan showed thickening of the bladder wall and a known compression fracture of the spine. An MRI indicated small vessel ischemia of the brain.  She has a history of chronic kidney disease with a GFR of 53 in July. Her medications include Jardiance  and losartan  for kidney protection, pioglitazone , metformin , and sertraline  for diabetes and anxiety associated with dementia. Her blood pressure medications include  losartan  and carvedilol . She is on Seroquel  for behavioral symptoms related to dementia, taking 50 mg in the morning, 150 mg in the evening, and 100 mg at night.  Her caregiver reports that her strength and bladder function have returned to normal, and she has not experienced recent episodes of gout. She is supervised for daily activities due to dementia but is not agitated or wandering. She has a Comptroller with her 24/7 for safety.   DMII with microalbuminuria, HTN and dyslipidemia, she is taking medications as prescribed and no side effects. A1C done at [facility] was down to 7.5 %  Atherosclerosis of Aorta and small vessel disease on statin therapy  HTN it was elevated on arrival but normalized before she went home, denies chest pain   Patient Active Problem List   Diagnosis Date Noted   Pancreatic cyst 05/01/2024   Chronic constipation 05/01/2024   Renal cyst, right 05/01/2024   Vitamin D  deficiency 05/01/2024   Small vessel disease (HCC) 05/01/2024   Atherosclerosis of aorta (HCC) 05/01/2024   Fall 08/05/2023   Dementia due to medical condition without behavioral disturbance (HCC) 01/04/2023   RSV infection 11/15/2022   Mild protein-calorie malnutrition (HCC) 05/08/2022   Stage 3a chronic kidney disease (HCC) 05/08/2022   Type 2 diabetes mellitus with microalbuminuria, without long-term current use of insulin  (HCC) 05/08/2022   Pain due to onychomycosis of toenails of both feet 05/04/2019   Mild dementia (HCC) 11/01/2015   Auditory hallucination 07/26/2015   Anemia of chronic disease 07/05/2015   Cataract 04/06/2015   Type 2 diabetes mellitus with diabetic cataract (HCC) 04/06/2015   Benign essential HTN  04/02/2015   Controlled gout 04/02/2015   Dyslipidemia 04/02/2015   Fibromyalgia syndrome 04/02/2015   Chronic insomnia 04/02/2015   Cognitive decline 04/02/2015   Nodular basal cell carcinoma 04/02/2015   Osteopenia 04/02/2015   Calcium  blood increased 04/02/2015    Past  Surgical History:  Procedure Laterality Date   CESAREAN SECTION     TONSILECTOMY, ADENOIDECTOMY, BILATERAL MYRINGOTOMY AND TUBES     TUBAL LIGATION      Family History  Problem Relation Age of Onset   Hypertension Mother    Osteoporosis Mother    Cancer Mother        brain tumor   Hodgkin's lymphoma Father    Early death Father    Hypertension Sister    Cancer Sister        unknown   Hypertension Sister    Diabetes Sister    Cancer Sister 10       breast   Breast cancer Sister 27   Cancer Maternal Uncle     Social History   Tobacco Use   Smoking status: Never   Smokeless tobacco: Never   Tobacco comments:    smoking cessation materials not required  Substance Use Topics   Alcohol use: No     Current Outpatient Medications:    allopurinol  (ZYLOPRIM ) 100 MG tablet, Take 1 tablet by mouth twice daily, Disp: 60 tablet, Rfl: 0   carvedilol  (COREG ) 3.125 MG tablet, Take 1 tablet (3.125 mg total) by mouth 2 (two) times daily with a meal., Disp: 180 tablet, Rfl: 1   cholecalciferol (VITAMIN D3) 25 MCG (1000 UNIT) tablet, Take 1 tablet (1,000 Units total) by mouth daily., Disp: , Rfl:    donepezil  (ARICEPT ) 10 MG tablet, Take 1 tablet (10 mg total) by mouth at bedtime. (Patient taking differently: Take 10 mg by mouth daily. In the morning), Disp: 90 tablet, Rfl: 1   empagliflozin  (JARDIANCE ) 25 MG TABS tablet, Take 1 tablet (25 mg total) by mouth daily., Disp: 90 tablet, Rfl: 1   feeding supplement (ENSURE ENLIVE / ENSURE PLUS) LIQD, Take 237 mLs by mouth 3 (three) times daily between meals., Disp: 237 mL, Rfl: 12   losartan  (COZAAR ) 25 MG tablet, Take 1 tablet (25 mg total) by mouth daily., Disp: 90 tablet, Rfl: 1   memantine  (NAMENDA ) 10 MG tablet, Take 10 mg by mouth 2 (two) times daily., Disp: , Rfl:    metFORMIN  (GLUCOPHAGE -XR) 500 MG 24 hr tablet, Take 1 tablet (500 mg total) by mouth 2 (two) times daily with a meal., Disp: 180 tablet, Rfl: 1   MULTIPLE VITAMIN PO, Take 1  tablet by mouth daily., Disp: , Rfl:    pioglitazone  (ACTOS ) 15 MG tablet, Take 1 tablet (15 mg total) by mouth daily., Disp: 90 tablet, Rfl: 1   polyethylene glycol powder (GLYCOLAX /MIRALAX ) 17 GM/SCOOP powder, Take 17 g by mouth daily., Disp: 3350 g, Rfl: 1   QUEtiapine  (SEROQUEL ) 100 MG tablet, Take 1 tablet (100 mg total) by mouth at bedtime., Disp: 30 tablet, Rfl: 0   rosuvastatin  (CRESTOR ) 20 MG tablet, Take 1 tablet (20 mg total) by mouth daily., Disp: 90 tablet, Rfl: 1   sertraline  (ZOLOFT ) 50 MG tablet, Take 50 mg by mouth every evening., Disp: , Rfl:   Allergies  Allergen Reactions   Ace Inhibitors     I personally reviewed active problem list, medication list, allergies, family history with the patient/caregiver today.   ROS  Ten systems reviewed and is negative except as mentioned  in HPI    Objective Physical Exam CONSTITUTIONAL: Patient appears well-developed and well-nourished.  No distress. HEENT: Head atraumatic, normocephalic, neck supple. CARDIOVASCULAR: Normal rate, regular rhythm and normal heart sounds.  No murmur heard. No BLE edema. PULMONARY: Effort normal and breath sounds normal. No respiratory distress. ABDOMINAL: There is no tenderness or distention. MUSCULOSKELETAL: Normal gait. Without gross motor or sensory deficit. PSYCHIATRIC: Patient has a normal mood and affect. behavior is normal. Judgment and thought content normal.  Vitals:   05/01/24 1004  BP: (!) 178/96  Pulse: 99  Resp: 16  SpO2: 97%  Weight: 140 lb 8 oz (63.7 kg)  Height: 4' 11 (1.499 m)    Body mass index is 28.38 kg/m.  Recent Results (from the past 2160 hours)  HM DIABETES EYE EXAM     Status: None   Collection Time: 02/12/24  8:16 AM  Result Value Ref Range   HM Diabetic Eye Exam No Retinopathy No Retinopathy    Comment: ABS BY HIM  POCT Urinalysis Dipstick     Status: None   Collection Time: 04/16/24  3:46 PM  Result Value Ref Range   Color, UA yellow    Clarity, UA  clear    Glucose, UA Negative Negative   Bilirubin, UA neg    Ketones, UA neg    Spec Grav, UA 1.020 1.010 - 1.025   Blood, UA trace    pH, UA 6.5 5.0 - 8.0   Protein, UA Negative Negative   Urobilinogen, UA 0.2 0.2 or 1.0 E.U./dL   Nitrite, UA neg    Leukocytes, UA Negative Negative   Appearance clear    Odor normal   Urine Culture     Status: None   Collection Time: 04/16/24  4:07 PM  Result Value Ref Range   MICRO NUMBER: [id number]    SPECIMEN QUALITY: Adequate    Sample Source URINE    STATUS: FINAL    Result: No Growth     Diabetic Foot Exam:     PHQ2/9:    05/01/2024    9:58 AM 01/06/2024    3:18 PM 09/06/2023   11:54 AM 05/30/2023   11:18 AM 05/06/2023   11:46 AM  Depression screen PHQ 2/9  Decreased Interest 0 0 0 0 0  Down, Depressed, Hopeless 0 0 0 0 0  PHQ - 2 Score 0 0 0 0 0  Altered sleeping  0 0 0 0  Tired, decreased energy  0 0 0 0  Change in appetite  0 0 0 0  Feeling bad or failure about yourself   0 0 0 0  Trouble concentrating  0 0 0 0  Moving slowly or fidgety/restless  0 0 0 0  Suicidal thoughts  0 0 0 0  PHQ-9 Score  0 0 0 0  Difficult doing work/chores  Not difficult at all       phq 9 is negative  Fall Risk:    05/01/2024    9:58 AM 01/06/2024    3:18 PM 09/06/2023   11:54 AM 05/30/2023   11:18 AM 05/06/2023   11:46 AM  Fall Risk   Falls in the past year? 1 0 0 0 0  Number falls in past yr: 1 0  0   Injury with Fall? 1 0  0   Risk for fall due to : Impaired balance/gait  No Fall Risks No Fall Risks No Fall Risks  Follow up Falls evaluation completed Falls evaluation completed Falls prevention  discussed Falls prevention discussed Falls prevention discussed;Education provided;Falls evaluation completed     Assessment & Plan Dementia with behavioral disturbance and recent encephalopathy Dementia with recent encephalopathy likely due to possible UTI and constipation. Hallucinations improved. CT unremarkable, MRI showed small vessel  ischemia. - Continue Seroquel  50 mg morning, 150 mg evening, 100 mg night. - Continue sertraline  for anxiety. -Continue Namenda  and Aricept  - Cancel follow-up unless blood pressure is high.  Type 2 diabetes mellitus with microalbuminuria hypertension, and dyslipidemia Type 2 diabetes with improved A1c. CKD stage 3 with stable GFR. Hypertension managed with losartan  and carvedilol . Dyslipidemia management discussed. Blood pressure high today, likely due to rushed medication intake. - Continue metformin  500 mg twice daily. - Continue Jardiance  and losartan  for kidney protection. - Continue carvedilol  3.125 mg for hypertension. - Monitor blood pressure; follow up sooner if high. - Cancel follow-up unless blood pressure is high.  Aortic atherosclerosis and small vessel disase Aortic atherosclerosis with plaque buildup. Current treatment preventive for heart attack and stroke. - Continue current management for atherosclerosis and blood pressure control.  Osteoporosis with vertebral compression fracture Osteoporosis with vertebral compression fracture.  Chronic constipation Chronic constipation likely exacerbated by medications and age. - Discontinue Senokot S. - Start Miralax  as needed.  Renal cyst, right kidney Renal cyst on right kidney identified. No further investigation planned.  Cystic lesions of pancreas Cystic lesions of pancreas identified. No intervention planned due to condition and potential risks.  Thyroid  nodules Thyroid  nodules identified. No intervention planned as asymptomatic.  General Health Maintenance B12 supplementation discontinued due to high levels. - Discontinue B12 supplementation. - Continue vitamin D3 supplementation.

## 2024-05-06 ENCOUNTER — Ambulatory Visit: Payer: Self-pay

## 2024-05-06 ENCOUNTER — Observation Stay
Admission: EM | Admit: 2024-05-06 | Discharge: 2024-05-07 | Disposition: A | Discharge status: Home / Self Care | Attending: Internal Medicine | Admitting: Internal Medicine

## 2024-05-06 ENCOUNTER — Emergency Department

## 2024-05-06 ENCOUNTER — Other Ambulatory Visit: Payer: Self-pay

## 2024-05-06 DIAGNOSIS — K449 Diaphragmatic hernia without obstruction or gangrene: Secondary | ICD-10-CM | POA: Insufficient documentation

## 2024-05-06 DIAGNOSIS — Z79899 Other long term (current) drug therapy: Secondary | ICD-10-CM | POA: Insufficient documentation

## 2024-05-06 DIAGNOSIS — Z7984 Long term (current) use of oral hypoglycemic drugs: Secondary | ICD-10-CM | POA: Insufficient documentation

## 2024-05-06 DIAGNOSIS — K862 Cyst of pancreas: Secondary | ICD-10-CM | POA: Insufficient documentation

## 2024-05-06 DIAGNOSIS — R531 Weakness: Secondary | ICD-10-CM | POA: Diagnosis present

## 2024-05-06 DIAGNOSIS — N3000 Acute cystitis without hematuria: Secondary | ICD-10-CM

## 2024-05-06 DIAGNOSIS — Z9181 History of falling: Secondary | ICD-10-CM | POA: Diagnosis not present

## 2024-05-06 DIAGNOSIS — I4581 Long QT syndrome: Secondary | ICD-10-CM | POA: Diagnosis not present

## 2024-05-06 DIAGNOSIS — G9341 Metabolic encephalopathy: Secondary | ICD-10-CM | POA: Insufficient documentation

## 2024-05-06 DIAGNOSIS — Z85828 Personal history of other malignant neoplasm of skin: Secondary | ICD-10-CM | POA: Diagnosis not present

## 2024-05-06 DIAGNOSIS — E1122 Type 2 diabetes mellitus with diabetic chronic kidney disease: Secondary | ICD-10-CM | POA: Insufficient documentation

## 2024-05-06 DIAGNOSIS — I129 Hypertensive chronic kidney disease with stage 1 through stage 4 chronic kidney disease, or unspecified chronic kidney disease: Secondary | ICD-10-CM | POA: Diagnosis not present

## 2024-05-06 DIAGNOSIS — F32A Depression, unspecified: Secondary | ICD-10-CM | POA: Insufficient documentation

## 2024-05-06 DIAGNOSIS — F0390 Unspecified dementia without behavioral disturbance: Secondary | ICD-10-CM | POA: Insufficient documentation

## 2024-05-06 DIAGNOSIS — F419 Anxiety disorder, unspecified: Secondary | ICD-10-CM | POA: Insufficient documentation

## 2024-05-06 DIAGNOSIS — F418 Other specified anxiety disorders: Secondary | ICD-10-CM | POA: Diagnosis present

## 2024-05-06 DIAGNOSIS — Z6828 Body mass index (BMI) 28.0-28.9, adult: Secondary | ICD-10-CM | POA: Diagnosis not present

## 2024-05-06 DIAGNOSIS — E663 Overweight: Secondary | ICD-10-CM | POA: Insufficient documentation

## 2024-05-06 DIAGNOSIS — R4182 Altered mental status, unspecified: Principal | ICD-10-CM

## 2024-05-06 DIAGNOSIS — E785 Hyperlipidemia, unspecified: Secondary | ICD-10-CM | POA: Diagnosis not present

## 2024-05-06 DIAGNOSIS — I10 Essential (primary) hypertension: Secondary | ICD-10-CM | POA: Diagnosis not present

## 2024-05-06 DIAGNOSIS — N390 Urinary tract infection, site not specified: Principal | ICD-10-CM | POA: Insufficient documentation

## 2024-05-06 DIAGNOSIS — F03918 Unspecified dementia, unspecified severity, with other behavioral disturbance: Secondary | ICD-10-CM | POA: Diagnosis not present

## 2024-05-06 DIAGNOSIS — N1831 Chronic kidney disease, stage 3a: Secondary | ICD-10-CM | POA: Insufficient documentation

## 2024-05-06 DIAGNOSIS — M109 Gout, unspecified: Secondary | ICD-10-CM | POA: Insufficient documentation

## 2024-05-06 DIAGNOSIS — R9431 Abnormal electrocardiogram [ECG] [EKG]: Secondary | ICD-10-CM | POA: Diagnosis present

## 2024-05-06 DIAGNOSIS — E1129 Type 2 diabetes mellitus with other diabetic kidney complication: Secondary | ICD-10-CM | POA: Diagnosis present

## 2024-05-06 LAB — COMPREHENSIVE METABOLIC PANEL WITH GFR
ALT: 28 U/L (ref 0–44)
AST: 35 U/L (ref 15–41)
Albumin: 3.4 g/dL — ABNORMAL LOW (ref 3.5–5.0)
Alkaline Phosphatase: 89 U/L (ref 38–126)
Anion gap: 13 (ref 5–15)
BUN: 22 mg/dL (ref 8–23)
CO2: 25 mmol/L (ref 22–32)
Calcium: 10.1 mg/dL (ref 8.9–10.3)
Chloride: 103 mmol/L (ref 98–111)
Creatinine, Ser: 0.85 mg/dL (ref 0.44–1.00)
GFR, Estimated: >60 mL/min (ref >60)
Glucose, Bld: 195 mg/dL — ABNORMAL HIGH (ref 70–99)
Potassium: 4.1 mmol/L (ref 3.5–5.1)
Sodium: 141 mmol/L (ref 135–145)
Total Bilirubin: 0.5 mg/dL (ref 0.0–1.2)
Total Protein: 6.8 g/dL (ref 6.5–8.1)

## 2024-05-06 LAB — CBC WITH DIFFERENTIAL/PLATELET
Abs Immature Granulocytes: 0.02 K/uL (ref 0.00–0.07)
Basophils Absolute: 0.1 K/uL (ref 0.0–0.1)
Basophils Relative: 1 %
Eosinophils Absolute: 0.1 K/uL (ref 0.0–0.5)
Eosinophils Relative: 1 %
HCT: 33.4 % — ABNORMAL LOW (ref 36.0–46.0)
Hemoglobin: 10.5 g/dL — ABNORMAL LOW (ref 12.0–15.0)
Immature Granulocytes: 0 %
Lymphocytes Relative: 22 %
Lymphs Abs: 1.5 K/uL (ref 0.7–4.0)
MCH: 28.2 pg (ref 26.0–34.0)
MCHC: 31.4 g/dL (ref 30.0–36.0)
MCV: 89.8 fL (ref 80.0–100.0)
Monocytes Absolute: 0.6 K/uL (ref 0.1–1.0)
Monocytes Relative: 8 %
Neutro Abs: 4.9 K/uL (ref 1.7–7.7)
Neutrophils Relative %: 68 %
Platelets: 221 K/uL (ref 150–400)
RBC: 3.72 MIL/uL — ABNORMAL LOW (ref 3.87–5.11)
RDW: 13 % (ref 11.5–15.5)
WBC: 7.2 K/uL (ref 4.0–10.5)
nRBC: 0 % (ref 0.0–0.2)

## 2024-05-06 LAB — URINALYSIS, ROUTINE W REFLEX MICROSCOPIC
Bilirubin Urine: NEGATIVE
Glucose, UA: >=500 mg/dL — AB
Hgb urine dipstick: NEGATIVE
Ketones, ur: NEGATIVE mg/dL
Nitrite: NEGATIVE
Protein, ur: 30 mg/dL — AB
RBC / HPF: >50 RBC/hpf (ref 0–5)
Specific Gravity, Urine: 1.03 (ref 1.005–1.030)
Squamous Epithelial / HPF: 0 /HPF (ref 0–5)
WBC, UA: >50 WBC/hpf (ref 0–5)
pH: 5 (ref 5.0–8.0)

## 2024-05-06 LAB — PHOSPHORUS: Phosphorus: 2.8 mg/dL (ref 2.5–4.6)

## 2024-05-06 LAB — TROPONIN I (HIGH SENSITIVITY): Troponin I (High Sensitivity): 5 ng/L (ref <18)

## 2024-05-06 LAB — MAGNESIUM: Magnesium: 1.6 mg/dL — ABNORMAL LOW (ref 1.7–2.4)

## 2024-05-06 LAB — CBG MONITORING, ED: Glucose-Capillary: 149 mg/dL — ABNORMAL HIGH (ref 70–99)

## 2024-05-06 LAB — LACTIC ACID, PLASMA: Lactic Acid, Venous: 1.8 mmol/L (ref 0.5–1.9)

## 2024-05-06 LAB — LIPASE, BLOOD: Lipase: 73 U/L — ABNORMAL HIGH (ref 11–51)

## 2024-05-06 MED ORDER — MEMANTINE HCL 5 MG PO TABS
10.0000 mg | ORAL_TABLET | Freq: Two times a day (BID) | ORAL | Status: DC
  Administered 2024-05-06 – 2024-05-07 (×2): 10 mg via ORAL
  Filled 2024-05-06 (×2): qty 2

## 2024-05-06 MED ORDER — ADULT MULTIVITAMIN W/MINERALS CH
1.0000 | ORAL_TABLET | Freq: Every day | ORAL | Status: DC
  Administered 2024-05-07: 1 via ORAL
  Filled 2024-05-06: qty 1

## 2024-05-06 MED ORDER — QUETIAPINE FUMARATE 25 MG PO TABS: 100.0000 mg | ORAL_TABLET | Freq: Every day | ORAL | Status: DC

## 2024-05-06 MED ORDER — INSULIN ASPART 100 UNIT/ML IJ SOLN: 0.0000 [IU] | Freq: Three times a day (TID) | INTRAMUSCULAR | Status: DC

## 2024-05-06 MED ORDER — ALLOPURINOL 100 MG PO TABS
100.0000 mg | ORAL_TABLET | Freq: Two times a day (BID) | ORAL | Status: DC
  Administered 2024-05-06 – 2024-05-07 (×2): 100 mg via ORAL
  Filled 2024-05-06 (×2): qty 1

## 2024-05-06 MED ORDER — LACTATED RINGERS IV BOLUS
500.0000 mL | Freq: Once | INTRAVENOUS | Status: AC
  Administered 2024-05-06: 500 mL via INTRAVENOUS

## 2024-05-06 MED ORDER — MAGNESIUM SULFATE 2 GM/50ML IV SOLN
2.0000 g | Freq: Once | INTRAVENOUS | Status: AC
  Administered 2024-05-06: 2 g via INTRAVENOUS
  Filled 2024-05-06: qty 50

## 2024-05-06 MED ORDER — SODIUM CHLORIDE 0.9 % IV SOLN: 2.0000 g | INTRAVENOUS | Status: DC

## 2024-05-06 MED ORDER — ENOXAPARIN SODIUM 40 MG/0.4ML IJ SOSY
40.0000 mg | PREFILLED_SYRINGE | INTRAMUSCULAR | Status: DC
  Administered 2024-05-06: 40 mg via SUBCUTANEOUS
  Filled 2024-05-06: qty 0.4

## 2024-05-06 MED ORDER — CARVEDILOL 6.25 MG PO TABS
3.1250 mg | ORAL_TABLET | Freq: Two times a day (BID) | ORAL | Status: DC
  Administered 2024-05-07: 3.125 mg via ORAL
  Filled 2024-05-06: qty 1

## 2024-05-06 MED ORDER — SODIUM CHLORIDE 0.9 % IV SOLN
2.0000 g | Freq: Once | INTRAVENOUS | Status: AC
  Administered 2024-05-06: 2 g via INTRAVENOUS
  Filled 2024-05-06: qty 20

## 2024-05-06 MED ORDER — IOHEXOL 300 MG/ML  SOLN
100.0000 mL | Freq: Once | INTRAMUSCULAR | Status: AC | PRN
  Administered 2024-05-06: 100 mL via INTRAVENOUS

## 2024-05-06 MED ORDER — MORPHINE SULFATE (PF) 2 MG/ML IV SOLN: 1.0000 mg | INTRAVENOUS | Status: DC | PRN

## 2024-05-06 MED ORDER — DIPHENHYDRAMINE HCL 50 MG/ML IJ SOLN: 12.5000 mg | Freq: Three times a day (TID) | INTRAMUSCULAR | Status: DC | PRN

## 2024-05-06 MED ORDER — VITAMIN D 25 MCG (1000 UNIT) PO TABS
1000.0000 [IU] | ORAL_TABLET | Freq: Every day | ORAL | Status: DC
  Administered 2024-05-07: 1000 [IU] via ORAL
  Filled 2024-05-06: qty 1

## 2024-05-06 MED ORDER — LACTATED RINGERS IV BOLUS
1000.0000 mL | Freq: Once | INTRAVENOUS | Status: AC
  Administered 2024-05-06: 1000 mL via INTRAVENOUS

## 2024-05-06 MED ORDER — SERTRALINE HCL 50 MG PO TABS: 50.0000 mg | ORAL_TABLET | Freq: Every evening | ORAL | Status: DC

## 2024-05-06 MED ORDER — INSULIN ASPART 100 UNIT/ML IJ SOLN: 0.0000 [IU] | Freq: Every day | INTRAMUSCULAR | Status: DC

## 2024-05-06 MED ORDER — DONEPEZIL HCL 5 MG PO TABS: 10.0000 mg | ORAL_TABLET | Freq: Every day | ORAL | Status: DC

## 2024-05-06 MED ORDER — ROSUVASTATIN CALCIUM 20 MG PO TABS
20.0000 mg | ORAL_TABLET | Freq: Every day | ORAL | Status: DC
  Administered 2024-05-07: 20 mg via ORAL
  Filled 2024-05-06: qty 1

## 2024-05-06 MED ORDER — POLYETHYLENE GLYCOL 3350 17 G PO PACK
17.0000 g | PACK | Freq: Every day | ORAL | Status: DC | PRN
  Administered 2024-05-07: 17 g via ORAL
  Filled 2024-05-06: qty 1

## 2024-05-06 MED ORDER — OXYCODONE-ACETAMINOPHEN 5-325 MG PO TABS: 1.0000 | ORAL_TABLET | ORAL | Status: DC | PRN

## 2024-05-06 NOTE — Progress Notes (Signed)
 CODE SEPSIS - PHARMACY COMMUNICATION  **Broad Spectrum Antibiotics should be administered within 1 hour of Sepsis diagnosis**  Time Code Sepsis Called/Page Received: 1823  Antibiotics Ordered: Ceftriaxone    Time of 1st antibiotic administration: 1850  Additional action taken by pharmacy: N/A  If necessary, Name of Provider/Nurse Contacted: N/A   Estill CHRISTELLA Lutes, PharmD, BCPS Clinical Pharmacist 05/06/2024 6:23 PM

## 2024-05-06 NOTE — Telephone Encounter (Signed)
 FYI Only or Action Required?: FYI only for provider.  Patient was last seen in primary care on 05/01/2024 by Glenard Mire, MD.  Called Nurse Triage reporting Fatigue and Extremity Weakness.  Symptoms began 2 days ago.  Interventions attempted: Nothing.  Symptoms are: generalized weakness, leaning to her right side, unable to sit straight without leaning to the right unchanged.  Triage Disposition: Call EMS 911 Now  Patient/caregiver understands and will follow disposition?: Yes           Copied from CRM #[id number]. Topic: Clinical - Red Word Triage >> May 06, 2024  3:39 PM Avram MATSU wrote: Red Word that prompted transfer to Nurse Triage: weakness/ decline in motor skills. Reason for Disposition  [1] Weakness (i.e., paralysis, loss of muscle strength) of the face, arm / hand, or leg / foot on one side of the body AND [2] sudden onset AND [3] present now  (Exception: Bell's palsy suspected: weakness on one side of the face developing over hours to days, with no other symptoms.)  Answer Assessment - Initial Assessment Questions Daughter, Rosaline, on the phone for triage and she states she is not with the patient (patient has dementia and has difficulty reporting symptoms).   1. SYMPTOM: What is the main symptom you are concerned about? (e.g., weakness, numbness)     All over weakness/fatigue, leaning to the right side, patient not feeding herself (past couple of days she has had to be spoon fed). She states the PT home health came by and was having a hard time working with patient, she is not at her baseline.  2. ONSET: When did this start? (e.g., minutes, hours, days; while sleeping)     X 2 days, improved slightly today.  3. LAST NORMAL: When was the last time you (the patient) were normal (no symptoms)?     Friday when patient saw PCP.  4. PATTERN Does this come and go, or has it been constant since it started?  Is it present now?     Constant since  Monday.  5. CARDIAC SYMPTOMS: Have you had any of the following symptoms: chest pain, difficulty breathing, palpitations?     Denies difficulty breathing. Daughter states the patient has touched her chest, not clutched it but is unsure. She states the patient has dementia so it is hard to get accurate symptoms.  6. NEUROLOGIC SYMPTOMS: Have you had any of the following symptoms: headache, dizziness, vision loss, double vision, changes in speech, unsteady on your feet?     Denies changes in speech, changes in vision, headache, unsteady gait or dizziness, numbness and tingling.  7. OTHER SYMPTOMS: Do you have any other symptoms?     No.  8. PREGNANCY: Is there any chance you are pregnant? When was your last menstrual period?     N/A.  Protocols used: Neurologic Deficit-A-AH

## 2024-05-06 NOTE — Telephone Encounter (Signed)
 Pt's daughter states that they called EMS to come for her mother. States EMS is out at the house with her mother but requesting that the Nurse call EMS to give them more information on the patient. NT called EMS back to clarify, was instructed that the fire dept was there and EMS was en route and no further info was needed.

## 2024-05-06 NOTE — Sepsis Progress Note (Signed)
 Code Sepsis protocol being monitored by eLink.

## 2024-05-06 NOTE — ED Provider Notes (Signed)
 [facility] Provider Note    Event Date/Time   First MD Initiated Contact with Patient 05/06/24 1648     (approximate)   History   Chief Complaint Weakness and Emesis   HPI  Lindsey [name] is a 73 y.o. female with past medical history of hypertension, hyperlipidemia, diabetes, CKD, and dementia who presents to the ED complaining of weakness and vomiting.  Per EMS, daughter has noticed over the past 2 days that patient has been more lethargic than usual and seem to be leaning towards the right.  Daughter reported concern for stroke, also stated that patient was recently admitted to [facility] for UTI.  Patient reportedly at her baseline mental status but slower to respond than usual.  On arrival to the ED, she complains of abdominal pain and nausea, states she has been vomiting today but denies any diarrhea.  She is not aware of any fevers and denies any dysuria, flank pain, cough, chest pain, or shortness of breath.  Patient initially with low BP with EMS, improved with IV fluids.     Physical Exam   Triage Vital Signs: ED Triage Vitals  Encounter Vitals Group     BP --      Girls Systolic BP Percentile --      Girls Diastolic BP Percentile --      Boys Systolic BP Percentile --      Boys Diastolic BP Percentile --      Pulse --      Resp --      Temp --      Temp src --      SpO2 --      Weight 05/06/24 1657 142 lb (64.4 kg)     Height 05/06/24 1657 4' 11 (1.499 m)     Head Circumference --      Peak Flow --      Pain Score 05/06/24 1655 0     Pain Loc --      Pain Education --      Exclude from Growth Chart --     Most recent vital signs: Vitals:   05/06/24 1900 05/06/24 1930  BP: 106/61 121/60  Pulse: 100 (!) 104  Resp:  15  Temp:    SpO2: 98% 99%    Constitutional: Alert and oriented to person, but not place, time, or situation. Eyes: Conjunctivae are normal. Head: Atraumatic. Nose: No congestion/rhinnorhea. Mouth/Throat: Mucous  membranes are moist.  Cardiovascular: Normal rate, regular rhythm. Grossly normal heart sounds.  2+ radial pulses bilaterally. Respiratory: Normal respiratory effort.  No retractions. Lungs CTAB. Gastrointestinal: Soft and diffusely tender to palpation with mild distention. Musculoskeletal: No lower extremity tenderness nor edema.  Neurologic: Slow to respond but otherwise normal speech and language.  Global weakness with no gross focal neurologic deficits appreciated.    ED Results / Procedures / Treatments   Labs (all labs ordered are listed, but only abnormal results are displayed) Labs Reviewed  CBC WITH DIFFERENTIAL/PLATELET - Abnormal; Notable for the following components:      Result Value   RBC 3.72 (*)    Hemoglobin 10.5 (*)    HCT 33.4 (*)    All other components within normal limits  COMPREHENSIVE METABOLIC PANEL WITH GFR - Abnormal; Notable for the following components:   Glucose, Bld 195 (*)    Albumin 3.4 (*)    All other components within normal limits  LIPASE, BLOOD - Abnormal; Notable for the following components:  Lipase 73 (*)    All other components within normal limits  URINALYSIS, ROUTINE W REFLEX MICROSCOPIC - Abnormal; Notable for the following components:   Color, Urine YELLOW (*)    APPearance TURBID (*)    Glucose, UA >=500 (*)    Protein, ur 30 (*)    Leukocytes,Ua LARGE (*)    Bacteria, UA RARE (*)    All other components within normal limits  MAGNESIUM  - Abnormal; Notable for the following components:   Magnesium  1.6 (*)    All other components within normal limits  CULTURE, BLOOD (ROUTINE X 2)  CULTURE, BLOOD (ROUTINE X 2)  URINE CULTURE  LACTIC ACID, PLASMA  PHOSPHORUS  TROPONIN I (HIGH SENSITIVITY)     EKG  ED ECG REPORT I, Carlin Palin, the attending physician, personally viewed and interpreted this ECG.   Date: 05/06/2024  EKG Time: 17:24  Rate: 131  Rhythm: sinus tachycardia  Axis: Normal  Intervals:Prolonged QT  ST&T  Change: None  RADIOLOGY CT head reviewed and interpreted by me with no hemorrhage or midline shift.  PROCEDURES:  Critical Care performed: No  Procedures   MEDICATIONS ORDERED IN ED: Medications  magnesium  sulfate IVPB 2 g 50 mL (2 g Intravenous New Bag/Given 05/06/24 1852)  diphenhydrAMINE  (BENADRYL ) injection 12.5 mg (has no administration in time range)  morphine  (PF) 2 MG/ML injection 1 mg (has no administration in time range)  oxyCODONE -acetaminophen  (PERCOCET/ROXICET) 5-325 MG per tablet 1 tablet (has no administration in time range)  lactated ringers  bolus 1,000 mL (0 mLs Intravenous Stopped 05/06/24 1933)  cefTRIAXone  (ROCEPHIN ) 2 g in sodium chloride  0.9 % 100 mL IVPB (0 g Intravenous Stopped 05/06/24 1920)  iohexol  (OMNIPAQUE ) 300 MG/ML solution 100 mL (100 mLs Intravenous Contrast Given 05/06/24 1828)     IMPRESSION / MDM / ASSESSMENT AND PLAN / ED COURSE  I reviewed the triage vital signs and the nursing notes.                              73 y.o. female with past medical history of hypertension, hyperlipidemia, diabetes, CKD, and dementia who presents to the ED with increasing lethargy over the past 2 days, has had abdominal pain and vomiting today.  Patient's presentation is most consistent with acute presentation with potential threat to life or bodily function.  Differential diagnosis includes, but is not limited to, sepsis, UTI, pneumonia, bowel obstruction, dehydration, electrolyte abnormality, AKI, pancreatitis, hepatitis, cholecystitis, biliary colic, appendicitis, diverticulitis.  Patient chronically ill but nontoxic-appearing and in no acute distress, vital signs are unremarkable.  Sepsis workup initiated but with reassuring vital signs, will hold off on IV antibiotics and hydrate with IV fluids for now.  Plan to check CT head and CT of her abdomen/pelvis, chest x-ray and urinalysis also pending.  Patient declines pain or nausea medication at this time.  CT head  is negative for acute process, CT imaging of the abdomen/pelvis shows bladder wall thickening as well as constipation, no other acute finding noted.  Urinalysis concerning for UTI, will send for culture and treat with IV Rocephin .  Labs without significant anemia, leukocytosis, lactate abnormality, or AKI.  LFTs are unremarkable and lactic acid within normal limits.  Patient does have some hypomagnesemia that was repleted, case discussed with hospitalist for admission.      FINAL CLINICAL IMPRESSION(S) / ED DIAGNOSES   Final diagnoses:  Altered mental status, unspecified altered mental status type  Acute cystitis without  hematuria     Rx / DC Orders   ED Discharge Orders     None        Note:  This document was prepared using Dragon voice recognition software and may include unintentional dictation errors.   Willo Dunnings, MD 05/06/24 [phone]

## 2024-05-06 NOTE — H&P (Signed)
 History and Physical    Lindsey [name] FMW:[id number] DOB: [birth year] DOA: 05/06/2024  Referring MD/NP/PA:   PCP: Sowles, Krichna, MD   Patient coming from:  The patient is coming from home.     Chief Complaint: Weakness  HPI: Lindsey [name] is a 73 y.o. female with medical history significant of HTN, HLD, DM, gout, mild dementia, pancreatic cyst, depression with anxiety, CKD-3A, fibromyalgia, insomnia, overweight, who presents with weakness.  Per her son and daughter-in-law at the bedside, patient was recently hospitalized from 7/8 - 7/12 to [facility] due to UTI and constipation.  After discharge, patient has been doing fine until 2 days ago when she started feeling weak.  Her weakness has been progressively worsening.  No fall. Per her daughter-in-law, patient has been leaning toward her right side, making her worried that patient may have stroke.  Patient has been lethargic.  Patient has nausea and vomited once earlier.  Currently no nausea, vomiting, diarrhea or abdominal pain.  Not sure if patient has symptoms of UTI due to dementia.  Patient does not have cough, chest pain or SOB per her daughter in law.  Per EMS report, patient had soft blood pressure 90/60, IV fluid dose given by EMS (not sure how much IV fluid).  When I saw patient in ED, patient is lethargic, knows her own name, not orientated to the person and the place.  Per her daughter-in-law, patient is not orientated to the time and place at her baseline.  Her mental status seem to be close to baseline, with lethargy.  She moves all extremities normally.  No facial droop or slurred speech.    Data reviewed independently and ED Course: pt was found to have WBC 7.2, UA (turbid appearance, large amount of leukocyte, rare bacteria, WBC> 50), magnesium  1.6, potassium 4.1, troponin 5, lactic acid 1.6, GFR> 60, normal liver function, lipase normal 73.  Temperature 99.6, blood pressure 121/60, heart rate 106, RR 22, oxygen  saturation 99% on room air.  Chest x-ray negative.  CT of head negative.  Chest x-ray negative for infiltration.  Patient is placed in telemetry bed for observation.  CT of abdomen/pelvis: 1. Mild diffuse bladder wall thickening and enhancement, suspicious for cystitis. 2. Cystic pancreatic lesions, also described on imaging earlier this month. Consensus guidelines recommend six-month follow-up MRI/MRCP. 3. Wall thickening of the distal esophagus with minimal hiatal hernia, can be seen with reflux or esophagitis. 4. Moderate to large volume of stool in the colon with colonic redundancy. Stool distends the rectum. Query constipation. 5. Indeterminate hypodense lesion in the mid right kidney measuring 14 mm, unchanged from earlier this month. 6. T11 compression fracture was described on outside exam earlier this month. L1 and L3 compression fractures are chronic.   Aortic Atherosclerosis (ICD10-I70.0).    EKG: I have personally reviewed.  Sinus rhythm, QTc 564, LAE, borderline LAD, low voltage.   Review of Systems: Could not be reviewed accurately due to dementia.   Allergy:  Allergies  Allergen Reactions   Ace Inhibitors     Past Medical History:  Diagnosis Date   Acute gout    Allergy ace inhibitors   Anemia of chronic disease    Anxiety    Basal cell carcinoma    Cataract    Depression, major, recurrent, mild (HCC)    Diabetes mellitus without complication (HCC)    Fibromyalgia syndrome    Hyperlipidemia    Hypertension    Osteoporosis    Retinopathy  Stage 3a chronic kidney disease (HCC) 05/08/2022    Past Surgical History:  Procedure Laterality Date   CESAREAN SECTION     TONSILECTOMY, ADENOIDECTOMY, BILATERAL MYRINGOTOMY AND TUBES     TUBAL LIGATION      Social History:  reports that she has never smoked. She has never used smokeless tobacco. She reports that she does not drink alcohol and does not use drugs.  Family History:  Family History  Problem  Relation Age of Onset   Hypertension Mother    Osteoporosis Mother    Cancer Mother        brain tumor   Hodgkin's lymphoma Father    Early death Father    Hypertension Sister    Cancer Sister        unknown   Hypertension Sister    Diabetes Sister    Cancer Sister 20       breast   Breast cancer Sister 44   Cancer Maternal Uncle      Prior to Admission medications   Medication Sig Start Date End Date Taking? Authorizing Provider  allopurinol  (ZYLOPRIM ) 100 MG tablet Take 1 tablet by mouth twice daily 04/22/24   Sowles, Krichna, MD  carvedilol  (COREG ) 3.125 MG tablet Take 1 tablet (3.125 mg total) by mouth 2 (two) times daily with a meal. 01/06/24   Glenard, Dorette, MD  cholecalciferol  (VITAMIN D3) 25 MCG (1000 UNIT) tablet Take 1 tablet (1,000 Units total) by mouth daily. 11/17/22   Wouk, Devaughn Sayres, MD  donepezil  (ARICEPT ) 10 MG tablet Take 1 tablet (10 mg total) by mouth at bedtime. Patient taking differently: Take 10 mg by mouth daily. In the morning 07/04/18   Sowles, Krichna, MD  empagliflozin  (JARDIANCE ) 25 MG TABS tablet Take 1 tablet (25 mg total) by mouth daily. 01/06/24   Sowles, Krichna, MD  feeding supplement (ENSURE ENLIVE / ENSURE PLUS) LIQD Take 237 mLs by mouth 3 (three) times daily between meals. 08/16/23   Regalado, Belkys A, MD  losartan  (COZAAR ) 25 MG tablet Take 1 tablet (25 mg total) by mouth daily. 01/06/24   Sowles, Krichna, MD  memantine  (NAMENDA ) 10 MG tablet Take 10 mg by mouth 2 (two) times daily. 06/14/20   [provider]  metFORMIN  (GLUCOPHAGE -XR) 500 MG 24 hr tablet Take 1 tablet (500 mg total) by mouth 2 (two) times daily with a meal. 01/06/24   Glenard, Dorette, MD  MULTIPLE VITAMIN PO Take 1 tablet by mouth daily. 12/28/10   [provider]  pioglitazone  (ACTOS ) 15 MG tablet Take 1 tablet (15 mg total) by mouth daily. 01/06/24   Sowles, Krichna, MD  polyethylene glycol powder (GLYCOLAX /MIRALAX ) 17 GM/SCOOP powder Take 17 g by mouth daily.  05/01/24   Sowles, Krichna, MD  QUEtiapine  (SEROQUEL ) 100 MG tablet Take 1 tablet (100 mg total) by mouth at bedtime. 08/16/23   Regalado, Belkys A, MD  rosuvastatin  (CRESTOR ) 20 MG tablet Take 1 tablet (20 mg total) by mouth daily. 01/06/24   Sowles, Krichna, MD  sertraline  (ZOLOFT ) 50 MG tablet Take 50 mg by mouth every evening.    [provider]    Physical Exam: Vitals:   05/06/24 1802 05/06/24 1854 05/06/24 1900 05/06/24 1930  BP: 129/68 120/61 106/61 121/60  Pulse: (!) 103 99 100 (!) 104  Resp: 20 (!) 22  15  Temp:      TempSrc:      SpO2: 98% 98% 98% 99%  Weight:      Height:  General: Not in acute distress HEENT:       Eyes: PERRL, EOMI, no jaundice       ENT: No discharge from the ears and nose       Neck: No JVD, no bruit, no mass felt. Heme: No neck lymph node enlargement. Cardiac: S1/S2, RRR, No murmurs, No gallops or rubs. Respiratory: No rales, wheezing, rhonchi or rubs. GI: Soft, nondistended, nontender, no organomegaly, BS present. GU: No hematuria Ext: No pitting leg edema bilaterally. 1+DP/PT pulse bilaterally. Musculoskeletal: No joint deformities, No joint redness or warmth, no limitation of ROM in spin. Skin: No rashes.  Neuro: Lethargic, partially following command, cranial nerves II-XII grossly intact, moves all extremities normally. Psych: Patient is not psychotic, no suicidal or hemocidal ideation.  Labs on Admission: I have personally reviewed following labs and imaging studies  CBC: Recent Labs  Lab 05/06/24 1712  WBC 7.2  NEUTROABS 4.9  HGB 10.5*  HCT 33.4*  MCV 89.8  PLT 221   Basic Metabolic Panel: Recent Labs  Lab 05/06/24 1712  NA 141  K 4.1  CL 103  CO2 25  GLUCOSE 195*  BUN 22  CREATININE 0.85  CALCIUM  10.1  MG 1.6*  PHOS 2.8   GFR: Estimated Creatinine Clearance: 47.4 mL/min (by C-G formula based on SCr of 0.85 mg/dL). Liver Function Tests: Recent Labs  Lab 05/06/24 1712  AST 35  ALT 28  ALKPHOS 89   BILITOT 0.5  PROT 6.8  ALBUMIN 3.4*   Recent Labs  Lab 05/06/24 1712  LIPASE 73*   No results for input(s): AMMONIA in the last 168 hours. Coagulation Profile: No results for input(s): INR, PROTIME in the last 168 hours. Cardiac Enzymes: No results for input(s): CKTOTAL, CKMB, CKMBINDEX, TROPONINI in the last 168 hours. BNP (last 3 results) No results for input(s): PROBNP in the last 8760 hours. HbA1C: No results for input(s): HGBA1C in the last 72 hours. CBG: Recent Labs  Lab 05/06/24 2049  GLUCAP 149*   Lipid Profile: No results for input(s): CHOL, HDL, LDLCALC, TRIG, CHOLHDL, LDLDIRECT in the last 72 hours. Thyroid  Function Tests: No results for input(s): TSH, T4TOTAL, FREET4, T3FREE, THYROIDAB in the last 72 hours. Anemia Panel: No results for input(s): VITAMINB12, FOLATE, FERRITIN, TIBC, IRON, RETICCTPCT in the last 72 hours. Urine analysis:    Component Value Date/Time   COLORURINE YELLOW (A) 05/06/2024 1712   APPEARANCEUR TURBID (A) 05/06/2024 1712   LABSPEC 1.030 05/06/2024 1712   PHURINE 5.0 05/06/2024 1712   GLUCOSEU >=500 (A) 05/06/2024 1712   HGBUR NEGATIVE 05/06/2024 1712   BILIRUBINUR NEGATIVE 05/06/2024 1712   BILIRUBINUR neg 04/16/2024 1546   KETONESUR NEGATIVE 05/06/2024 1712   PROTEINUR 30 (A) 05/06/2024 1712   UROBILINOGEN 0.2 04/16/2024 1546   NITRITE NEGATIVE 05/06/2024 1712   LEUKOCYTESUR LARGE (A) 05/06/2024 1712   Sepsis Labs: @LABRCNTIP (procalcitonin:4,lacticidven:4) )No results found for this or any previous visit (from the past 240 hours).   Radiological Exams on Admission:   Assessment/Plan Principal Problem:   UTI (urinary tract infection) Active Problems:   Pancreatic cyst   HTN (hypertension)   Type II diabetes mellitus with renal manifestations (HCC)   Dyslipidemia   Stage 3a chronic kidney disease (HCC)   Hypomagnesemia   Prolonged QT interval   Dementia with  behavioral disturbance (HCC)   Depression with anxiety   Overweight (BMI 25.0-29.9)   Assessment and Plan:   UTI (urinary tract infection): Patient does not have fever or leukocytosis, clinically not septic.  Lactic acid normal 1.8.  -Placed in telemetry bed for observation - IV Rocephin  - Follow-up blood culture urine culture - IV fluid: 1.5 L LR  Pancreatic cyst: This is known issue. -Patient need to follow-up with PCP as outpatient  HTN (hypertension) -IV hydralazine as needed - Continue Coreg  - Hold Cozaar  due to softer blood pressure  Type II diabetes mellitus with renal manifestations [facility]): Recent A1c 7.5, poorly controlled.  Patient is taking Jardiance , metformin  and Actos  -SSI  Dyslipidemia -Crestor   Stage 3a chronic kidney disease (HCC): Renal function stable.  GFR> 60 today - Follow-up with BMP  Hypomagnesemia: Magnesium  2.8, potassium 4.1, phosphorus 2.8. -Give 2 g magnesium  sulfate IV  Prolonged QT interval: QTc 564, likely due to hypomagnesemia. -Replete magnesium  - Hold donepezil   Dementia with behavioral disturbance (HCC) -Aamenda -Seroquel  - Hold donepezil  due to QT prolonation  Depression with anxiety - Seroquel   Overweight (BMI 25.0-29.9): Body weight 64.4 kg, BMI 28.68 - Encourage losing weight - Exercise healthy diet      DVT ppx: SQ Lovenox   Code Status: Full code    Family Communication:     Yes, patient's son and daughter-in-law at bed side.        Disposition Plan:  Anticipate discharge back to previous environment  Consults called:  none  Admission status and Level of care: Telemetry Medical:    for obs    Dispo: The patient is from: Home              Anticipated d/c is to: Home              Anticipated d/c date is: 1 day              Patient currently is not medically stable to d/c.    Severity of Illness:  The appropriate patient status for this patient is OBSERVATION. Observation status is judged to be  reasonable and necessary in order to provide the required intensity of service to ensure the patient's safety. The patient's presenting symptoms, physical exam findings, and initial radiographic and laboratory data in the context of their medical condition is felt to place them at decreased risk for further clinical deterioration. Furthermore, it is anticipated that the patient will be medically stable for discharge from the hospital within 2 midnights of admission.        Date of Service 05/06/2024    Caleb Exon [facility]   If 7PM-7AM, please contact night-coverage [email or web address] 05/06/2024, 8:53 PM

## 2024-05-06 NOTE — ED Triage Notes (Signed)
 Pt to ED via [facility] from home. Pt's daughter called out for possible stroke due to pt leaning to R side x 2 days. Upon EMS arrival pt as lethargic and vomiting. Pt's BP initially 90/60. Pt was recently at [facility] for UTI and received abx. Pt has dementia at baseline and is at her baseline mental status.  EMS vitals BP 115/76 HR 120 SPO2 92% RA  97.2 F Axillary

## 2024-05-06 NOTE — ED Notes (Signed)
 Fall bundle put in place at this time.

## 2024-05-07 DIAGNOSIS — K862 Cyst of pancreas: Secondary | ICD-10-CM | POA: Diagnosis not present

## 2024-05-07 DIAGNOSIS — F03918 Unspecified dementia, unspecified severity, with other behavioral disturbance: Secondary | ICD-10-CM | POA: Diagnosis not present

## 2024-05-07 DIAGNOSIS — N390 Urinary tract infection, site not specified: Secondary | ICD-10-CM | POA: Diagnosis not present

## 2024-05-07 DIAGNOSIS — G9341 Metabolic encephalopathy: Secondary | ICD-10-CM

## 2024-05-07 DIAGNOSIS — N3000 Acute cystitis without hematuria: Secondary | ICD-10-CM | POA: Diagnosis not present

## 2024-05-07 LAB — CBC
HCT: 31.3 % — ABNORMAL LOW (ref 36.0–46.0)
Hemoglobin: 9.8 g/dL — ABNORMAL LOW (ref 12.0–15.0)
MCH: 28.9 pg (ref 26.0–34.0)
MCHC: 31.3 g/dL (ref 30.0–36.0)
MCV: 92.3 fL (ref 80.0–100.0)
Platelets: 187 K/uL (ref 150–400)
RBC: 3.39 MIL/uL — ABNORMAL LOW (ref 3.87–5.11)
RDW: 13.2 % (ref 11.5–15.5)
WBC: 5.9 K/uL (ref 4.0–10.5)
nRBC: 0 % (ref 0.0–0.2)

## 2024-05-07 LAB — URINE CULTURE: Culture: NO GROWTH

## 2024-05-07 LAB — BASIC METABOLIC PANEL WITH GFR
Anion gap: 7 (ref 5–15)
BUN: 19 mg/dL (ref 8–23)
CO2: 29 mmol/L (ref 22–32)
Calcium: 9.6 mg/dL (ref 8.9–10.3)
Chloride: 106 mmol/L (ref 98–111)
Creatinine, Ser: 0.82 mg/dL (ref 0.44–1.00)
GFR, Estimated: >60 mL/min (ref >60)
Glucose, Bld: 121 mg/dL — ABNORMAL HIGH (ref 70–99)
Potassium: 4 mmol/L (ref 3.5–5.1)
Sodium: 142 mmol/L (ref 135–145)

## 2024-05-07 LAB — CBG MONITORING, ED: Glucose-Capillary: 100 mg/dL — ABNORMAL HIGH (ref 70–99)

## 2024-05-07 MED ORDER — SULFAMETHOXAZOLE-TRIMETHOPRIM 800-160 MG PO TABS: 1.0000 | ORAL_TABLET | Freq: Two times a day (BID) | ORAL | 0 refills | Status: DC

## 2024-05-07 MED ORDER — SULFAMETHOXAZOLE-TRIMETHOPRIM 400-80 MG PO TABS: 1.0000 | ORAL_TABLET | ORAL | 0 refills | Status: DC

## 2024-05-07 NOTE — Evaluation (Signed)
 Occupational Therapy Evaluation Patient Details Name: Lindsey [name] MRN: [id number] DOB: April 20, 1951 Today's Date: 05/07/2024   History of Present Illness   Lindsey [name] is a 73 y.o. female with medical history significant of HTN, HLD, DM, gout, mild dementia, pancreatic cyst, depression with anxiety, CKD-3A, fibromyalgia, insomnia, overweight, who presents with weakness.     Clinical Impressions Upon entering the room, pt supine in ED stretcher bed with daughter present in room. Pt is agreeable to OT intervention. Her daughter lives with her and is her caregiver. She reports she works during the day but pt has a PCA from 9am-6pm that assists with self care needs. Pt is only alone at home from 8-9am. Pt with cognitive deficits at baseline. Pt performs bed mobility with assistance and sits on chair in room for LB dressing with min A. Pt ambulates 100' with min guard progressing to close supervision. Daughter in room reports her  mother appears to be at baseline for mobility and self care needs. OT to complete orders at this time.      If plan is discharge home, recommend the following:   A little help with walking and/or transfers;A little help with bathing/dressing/bathroom;Assistance with cooking/housework;Direct supervision/assist for medications management;Direct supervision/assist for financial management;Supervision due to cognitive status;Assist for transportation;Help with stairs or ramp for entrance      Equipment Recommendations   None recommended by OT      Precautions/Restrictions   Precautions Precautions: Fall     Mobility Bed Mobility Overal bed mobility: Needs Assistance Bed Mobility: Supine to Sit     Supine to sit: Min assist     General bed mobility comments: from elevated stretcher    Transfers   Equipment used: 1 person hand held assist, None               General transfer comment: pt progressing from min guard to supervision overall       Balance Overall balance assessment: Needs assistance Sitting-balance support: Feet supported Sitting balance-Leahy Scale: Good     Standing balance support: No upper extremity supported Standing balance-Leahy Scale: Fair                             ADL either performed or assessed with clinical judgement   ADL Overall ADL's : At baseline                                       General ADL Comments: Pt needing min A for LB clothing management during session.     Vision Baseline Vision/History: 1 Wears glasses Patient Visual Report: No change from baseline              Pertinent Vitals/Pain Pain Assessment Pain Assessment: No/denies pain     Extremity/Trunk Assessment Upper Extremity Assessment Upper Extremity Assessment: Overall WFL for tasks assessed   Lower Extremity Assessment Lower Extremity Assessment: Overall WFL for tasks assessed       Communication Communication Communication: No apparent difficulties   Cognition Arousal: Alert Behavior During Therapy: Flat affect Cognition: History of cognitive impairments             OT - Cognition Comments: Daughter in room reports cognitive deficits at baseline                 Following commands: Impaired Following commands impaired: Follows one  step commands with increased time     Cueing  General Comments   Cueing Techniques: Verbal cues;Gestural cues;Tactile cues              Home Living Family/patient expects to be discharged to:: Private residence Living Arrangements: Children Available Help at Discharge: Personal care attendant;Family Type of Home: House Home Access: Stairs to enter [employer] of Steps: 2-3 Entrance Stairs-Rails: Right;Left Home Layout: One level     Bathroom Shower/Tub: Walk-in shower         Home Equipment: Agricultural consultant (2 wheels);Rollator (4 wheels)          Prior Functioning/Environment Prior Level of  Function : Needs assist             Mobility Comments: Pt ambulates without use of AD but someone is always with her ADLs Comments: Pt has aides 9am-6pm to assist with ADLs during the day and daughter at home to assist after they leave. She is only alone from 8-9 am.            OT Goals(Current goals can be found in the care plan section)   Acute Rehab OT Goals Patient Stated Goal: to go home OT Goal Formulation: With patient/family Time For Goal Achievement: 05/07/24 Potential to Achieve Goals: Good   AM-PAC OT 6 Clicks Daily Activity     Outcome Measure Help from another person eating meals?: None Help from another person taking care of personal grooming?: None Help from another person toileting, which includes using toliet, bedpan, or urinal?: A Little Help from another person bathing (including washing, rinsing, drying)?: A Little Help from another person to put on and taking off regular upper body clothing?: A Little Help from another person to put on and taking off regular lower body clothing?: A Little 6 Click Score: 20   End of Session Nurse Communication: Mobility status  Activity Tolerance: Patient tolerated treatment well Patient left: in chair;with call bell/phone within reach;with family/visitor present                   Time: 1015-1029 OT Time Calculation (min): 14 min Charges:  OT General Charges $OT Visit: 1 Visit OT Evaluation $OT Eval Low Complexity: 1 Low  Izetta Claude, MS, OTR/L , CBIS ascom [phone]  05/07/24, 10:40 AM

## 2024-05-07 NOTE — Progress Notes (Signed)
 PT Cancellation Note  Patient Details Name: Lindsey [name] MRN: [id number] DOB: 05/20/1951   Cancelled Treatment:    Reason Eval/Treat Not Completed: PT screened, no needs identified, will sign off (Consult received and chart reviewed. Per discussion with OT, patient at baseline level of functional ability; no acute PT needs.  Discharge order noted.  Will complete initial order; please reconsult should needs change.)   ,Keywon Mestre H. Delores, PT, DPT, NCS 05/07/24, 11:27 AM [phone]

## 2024-05-07 NOTE — ED Notes (Signed)
 Pt soiled linens. Brief changed, linens changed, gown changed. Pt tolerated well.

## 2024-05-07 NOTE — Discharge Summary (Signed)
 Physician Discharge Summary   Patient: Lindsey [name] MRN: [id number] DOB: 08-28-1951  Admit date:     05/06/2024  Discharge date: 05/07/24  Discharge Physician: Carliss LELON Canales   PCP: Sowles, Krichna, MD   Recommendations at discharge:    Pt to be discharged home.   If you experience worsening fever, chills, chest pain, shortness of breath, or other concerning symptoms, please call your PCP or go to the emergency department immediately.  Discharge Diagnoses: Principal Problem:   UTI (urinary tract infection) Active Problems:   Pancreatic cyst   HTN (hypertension)   Type II diabetes mellitus with renal manifestations (HCC)   Dyslipidemia   Stage 3a chronic kidney disease (HCC)   Hypomagnesemia   Prolonged QT interval   Dementia with behavioral disturbance (HCC)   Depression with anxiety   Overweight (BMI 25.0-29.9)  Resolved Problems:   * No resolved hospital problems. *   Hospital Course:  73 y.o. female with medical history significant of HTN, HLD, DM, gout, mild dementia, pancreatic cyst, depression with anxiety, CKD-3A, fibromyalgia, insomnia, overweight, who presents with weakness.   Per her son and daughter-in-law at the bedside, patient was recently hospitalized from 7/8 - 7/12 to [facility] due to UTI and constipation.  After discharge, patient has been doing fine until 2 days ago when she started feeling weak.  Her weakness has been progressively worsening.  No fall. Per her daughter-in-law, patient has been leaning toward her right side, making her worried that patient may have stroke.  Patient has been lethargic.  Patient has nausea and vomited once earlier.  Currently no nausea, vomiting, diarrhea or abdominal pain.  Not sure if patient has symptoms of UTI due to dementia.  Patient does not have cough, chest pain or SOB per her daughter in law.  Per EMS report, patient had soft blood pressure 90/60, IV fluid dose given by EMS (not sure how much IV fluid).    When I saw patient in ED, patient is lethargic, knows her own name, not orientated to the person and the place.  Per her daughter-in-law, patient is not orientated to the time and place at her baseline.  Her mental status seem to be close to baseline, with lethargy.  She moves all extremities normally.  No facial droop or slurred speech.  Assessment and Plan:  Acute metabolic encephalopathy with underlying dementia - Mild lethargy and weakness likely secondary to UTI.  Appears to be resolved this morning.  Resume home donepezil , Namenda , Seroquel .  Likely urinary tract infection - No fever nor leukocytosis however UA showing LE, WBCs, bacteria.  CT noting bladder wall thickening suspicious for cystitis.  Initiated on IV fluids and IV Rocephin  empirically.  Patient showing movement overnight.  Will transition patient to p.o. Bactrim  DS to take for the next 3 days.  Will also recommend patient take prophylaxis dose of Bactrim  weekly.  Recommend follow-up PCP in the outpatient setting.  Pancreatic cyst - Known issue.  Follow-up with PCP in outpatient setting.  Diabetes mellitus - A1c 7.5 suggesting only moderate control.  Continue home medication regimen.  Hypomagnesemia - Resolved after IV magnesium  replacement.   Consultants: None Procedures performed: None Disposition: Home Diet recommendation:  Discharge Diet Orders (From admission, onward)     Start     Ordered   05/07/24 0000  Diet - low sodium heart healthy        05/07/24 1007           Cardiac diet  DISCHARGE MEDICATION: Allergies as of 05/07/2024       Reactions   Ace Inhibitors         Medication List     TAKE these medications    allopurinol  100 MG tablet Commonly known as: ZYLOPRIM  Take 1 tablet by mouth twice daily   carvedilol  3.125 MG tablet Commonly known as: COREG  Take 1 tablet (3.125 mg total) by mouth 2 (two) times daily with a meal.   cholecalciferol  25 MCG (1000 UNIT) tablet Commonly  known as: VITAMIN D3 Take 1 tablet (1,000 Units total) by mouth daily.   donepezil  10 MG tablet Commonly known as: ARICEPT  Take 1 tablet (10 mg total) by mouth at bedtime. What changed:  when to take this additional instructions   empagliflozin  25 MG Tabs tablet Commonly known as: Jardiance  Take 1 tablet (25 mg total) by mouth daily.   feeding supplement Liqd Take 237 mLs by mouth 3 (three) times daily between meals.   losartan  25 MG tablet Commonly known as: COZAAR  Take 1 tablet (25 mg total) by mouth daily.   memantine  10 MG tablet Commonly known as: NAMENDA  Take 10 mg by mouth 2 (two) times daily.   metFORMIN  500 MG 24 hr tablet Commonly known as: GLUCOPHAGE -XR Take 1 tablet (500 mg total) by mouth 2 (two) times daily with a meal.   MULTIPLE VITAMIN PO Take 1 tablet by mouth daily.   pioglitazone  15 MG tablet Commonly known as: Actos  Take 1 tablet (15 mg total) by mouth daily.   polyethylene glycol powder 17 GM/SCOOP powder Commonly known as: GLYCOLAX /MIRALAX  Take 17 g by mouth daily.   QUEtiapine  100 MG tablet Commonly known as: SEROQUEL  Take 1 tablet (100 mg total) by mouth at bedtime. What changed: additional instructions   rosuvastatin  20 MG tablet Commonly known as: CRESTOR  Take 1 tablet (20 mg total) by mouth daily.   sertraline  50 MG tablet Commonly known as: ZOLOFT  Take 50 mg by mouth every evening.   sulfamethoxazole -trimethoprim  800-160 MG tablet Commonly known as: Bactrim  DS Take 1 tablet by mouth 2 (two) times daily.   sulfamethoxazole -trimethoprim  400-80 MG tablet Commonly known as: Bactrim  Take 1 tablet by mouth once a week.         Discharge Exam: Filed Weights   05/06/24 1657  Weight: 64.4 kg    GENERAL:  Alert, pleasant, no acute distress  HEENT:  EOMI CARDIOVASCULAR:  RRR, no murmurs appreciated RESPIRATORY:  Clear to auscultation, no wheezing, rales, or rhonchi GASTROINTESTINAL:  Soft, nontender,  nondistended EXTREMITIES:  No LE edema bilaterally NEURO:  No new focal deficits appreciated SKIN:  No rashes noted PSYCH:  Appropriate mood and affect     Condition at discharge: improving  The results of significant diagnostics from this hospitalization (including imaging, microbiology, ancillary and laboratory) are listed below for reference.   Imaging Studies: CT ABDOMEN PELVIS W CONTRAST Result Date: 05/06/2024 CLINICAL DATA:  Acute abdominal pain. Recent urinary tract infection. EXAM: CT ABDOMEN AND PELVIS WITH CONTRAST TECHNIQUE: Multidetector CT imaging of the abdomen and pelvis was performed using the standard protocol following bolus administration of intravenous contrast. RADIATION DOSE REDUCTION: This exam was performed according to the departmental dose-optimization program which includes automated exposure control, adjustment of the mA and/or kV according to patient size and/or use of iterative reconstruction technique. CONTRAST:  100mL OMNIPAQUE  IOHEXOL  300 MG/ML  SOLN COMPARISON:  Report from abdominopelvic CT 04/21/2024 at an outside institution, images unavailable. FINDINGS: Lower chest: The heart is enlarged. There are coronary  artery calcifications. Scattered atelectasis in the lung bases. Hepatobiliary: The liver is prominent size spanning 17.8 cm cranial caudal. No focal liver abnormality. Partially distended gallbladder. No calcified gallstone. No pericholecystic inflammation. No biliary dilatation. Pancreas: No ductal dilatation or inflammation. Septated cystic lesion in the mid pancreas measures 3.2 x 2.9 x 3.9 cm, series 2, image 27. Adjacent 12 mm pancreatic cyst series 2, image 27. There is no ductal dilatation. No pancreatic inflammation. Spleen: Normal in size without focal abnormality. Adrenals/Urinary Tract: No adrenal nodule. No hydronephrosis or evidence of renal inflammation. No renal calculi. Indeterminate hypodense lesion in the mid right kidney measuring 14 mm,  series 2, image 33. Mild diffuse bladder wall thickening and enhancement. Stomach/Bowel: Wall thickening of the distal esophagus with minimal hiatal hernia. Small duodenal diverticulum. No small bowel obstruction or inflammation. High-riding cecum in the mid abdomen. Appendix is not definitively seen. Moderate to large volume of stool in the colon with colonic redundancy. Left colonic diverticula, no diverticulitis. There is stool distending the rectum, 6.1 cm. Vascular/Lymphatic: Aortic atherosclerosis without aneurysm. Patent portal vein. No acute vascular findings. No abdominopelvic adenopathy. Reproductive: Uterus and bilateral adnexa are unremarkable. Other: No ascites or free air.  No abdominopelvic collection. Musculoskeletal: Chronic compression deformities of L1 and L3, unchanged from October lumbar spine exam. T11 superior endplate compression fracture with 35% loss of height centrally was described on CT earlier this month. IMPRESSION: 1. Mild diffuse bladder wall thickening and enhancement, suspicious for cystitis. 2. Cystic pancreatic lesions, also described on imaging earlier this month. Consensus guidelines recommend six-month follow-up MRI/MRCP. 3. Wall thickening of the distal esophagus with minimal hiatal hernia, can be seen with reflux or esophagitis. 4. Moderate to large volume of stool in the colon with colonic redundancy. Stool distends the rectum. Query constipation. 5. Indeterminate hypodense lesion in the mid right kidney measuring 14 mm, unchanged from earlier this month. 6. T11 compression fracture was described on outside exam earlier this month. L1 and L3 compression fractures are chronic. Aortic Atherosclerosis (ICD10-I70.0). Electronically Signed   By: Andrea Gasman M.D.   On: 05/06/2024 19:22   CT Head Wo Contrast Result Date: 05/06/2024 CLINICAL DATA:  Mental status change lethargy EXAM: CT HEAD WITHOUT CONTRAST TECHNIQUE: Contiguous axial images were obtained from the base of  the skull through the vertex without intravenous contrast. RADIATION DOSE REDUCTION: This exam was performed according to the departmental dose-optimization program which includes automated exposure control, adjustment of the mA and/or kV according to patient size and/or use of iterative reconstruction technique. COMPARISON:  MRI 08/05/2023, CT brain 08/05/2023 FINDINGS: Brain: No acute territorial infarction, hemorrhage or intracranial mass. Atrophy and chronic small vessel ischemic changes of the white matter. Stable ventricle size. Vascular: No hyperdense vessels. Vertebral and carotid vascular calcification Skull: Normal. Negative for fracture or focal lesion. Sinuses/Orbits: No acute finding. Other: None IMPRESSION: 1. No CT evidence for acute intracranial abnormality. 2. Atrophy and chronic small vessel ischemic changes of the white matter. Electronically Signed   By: Luke Bun M.D.   On: 05/06/2024 19:10   DG Chest Portable 1 View Result Date: 05/06/2024 CLINICAL DATA:  Weakness EXAM: PORTABLE CHEST 1 VIEW COMPARISON:  08/05/2023 FINDINGS: Hypoventilatory changes. Bandlike atelectasis or scarring at the bases. Upper normal cardiac size. No pleural effusion or pneumothorax IMPRESSION: Hypoventilatory changes with bandlike atelectasis or scarring at the bases. Electronically Signed   By: Luke Bun M.D.   On: 05/06/2024 17:30    Microbiology: Results for orders placed or performed during  the hospital encounter of 05/06/24  Culture, blood (routine x 2)     Status: None (Preliminary result)   Collection Time: 05/06/24  5:11 PM   Specimen: BLOOD  Result Value Ref Range Status   Specimen Description BLOOD LEFT ANTECUBITAL  Final   Special Requests   Final    BOTTLES DRAWN AEROBIC AND ANAEROBIC Blood Culture results may not be optimal due to an inadequate volume of blood received in culture bottles   Culture   Final    NO GROWTH < 24 HOURS Performed at [facility], [address] [place], [place] [address]    Report Status PENDING  Incomplete  Culture, blood (routine x 2)     Status: None (Preliminary result)   Collection Time: 05/06/24  5:12 PM   Specimen: BLOOD  Result Value Ref Range Status   Specimen Description BLOOD BLOOD RIGHT FOREARM  Final   Special Requests   Final    BOTTLES DRAWN AEROBIC AND ANAEROBIC Blood Culture results may not be optimal due to an inadequate volume of blood received in culture bottles   Culture   Final    NO GROWTH < 24 HOURS Performed at [facility], [address] [place], [place] [address]    Report Status PENDING  Incomplete    Labs: CBC: Recent Labs  Lab 05/06/24 1712 05/07/24 0404  WBC 7.2 5.9  NEUTROABS 4.9  --   HGB 10.5* 9.8*  HCT 33.4* 31.3*  MCV 89.8 92.3  PLT 221 187   Basic Metabolic Panel: Recent Labs  Lab 05/06/24 1712 05/07/24 0404  NA 141 142  K 4.1 4.0  CL 103 106  CO2 25 29  GLUCOSE 195* 121*  BUN 22 19  CREATININE 0.85 0.82  CALCIUM  10.1 9.6  MG 1.6*  --   PHOS 2.8  --    Liver Function Tests: Recent Labs  Lab 05/06/24 1712  AST 35  ALT 28  ALKPHOS 89  BILITOT 0.5  PROT 6.8  ALBUMIN 3.4*   CBG: Recent Labs  Lab 05/06/24 2049 05/07/24 0729  GLUCAP 149* 100*    Discharge time spent: 35 minutes.  Length of inpatient stay: 0 days  Signed: Carliss LELON Canales, DO [facility] 05/07/2024

## 2024-05-08 ENCOUNTER — Ambulatory Visit: Admitting: Family Medicine

## 2024-05-11 LAB — CULTURE, BLOOD (ROUTINE X 2)
Culture: NO GROWTH
Culture: NO GROWTH

## 2024-05-20 ENCOUNTER — Other Ambulatory Visit: Payer: Self-pay | Admitting: Family Medicine

## 2024-05-20 DIAGNOSIS — M109 Gout, unspecified: Secondary | ICD-10-CM

## 2024-05-21 ENCOUNTER — Telehealth: Payer: Self-pay

## 2024-05-21 NOTE — Telephone Encounter (Signed)
 Copied from CRM #[id number]. Topic: Clinical - Home Health Verbal Orders >> May 21, 2024  3:09 PM Tiffini S wrote: Caller/Agency: Dorothyann Jenny with [facility]  Callback Number: [phone] Service Requested: Physical Therapy Frequency: 1 W 2  Any new concerns about the patient? No

## 2024-05-22 NOTE — Telephone Encounter (Signed)
 Left detailed vm

## 2024-05-25 ENCOUNTER — Encounter: Payer: Self-pay | Admitting: Podiatry

## 2024-05-25 ENCOUNTER — Ambulatory Visit (INDEPENDENT_AMBULATORY_CARE_PROVIDER_SITE_OTHER): Admitting: Podiatry

## 2024-05-25 DIAGNOSIS — M79674 Pain in right toe(s): Secondary | ICD-10-CM | POA: Diagnosis not present

## 2024-05-25 DIAGNOSIS — M79675 Pain in left toe(s): Secondary | ICD-10-CM | POA: Diagnosis not present

## 2024-05-25 DIAGNOSIS — B351 Tinea unguium: Secondary | ICD-10-CM | POA: Diagnosis not present

## 2024-05-31 NOTE — Progress Notes (Signed)
  Subjective:  Patient ID: Lindsey [name], female    DOB: Feb 26, 1951,  MRN: [id number]  Lindsey [name] presents to clinic today for: painful thick toenails that are difficult to trim. Pain interferes with ambulation. Aggravating factors include wearing enclosed shoe gear. Pain is relieved with periodic professional debridement.   PCP is Sowles, Krichna, MD.  Allergies  Allergen Reactions   Ace Inhibitors     Review of Systems: Negative except as noted in the HPI.  Objective: No changes noted in today's physical examination. There were no vitals filed for this visit.  Lindsey [name] is a pleasant 73 y.o. female WD, WN in NAD. AAO x 3.  Vascular Examination: Capillary refill time <3 seconds b/l LE. Palpable pedal pulses b/l LE. Digital hair present b/l. No pedal edema b/l. Skin temperature gradient WNL b/l. No varicosities b/l. No cyanosis or clubbing. No ischemia or gangrene. Trace edema noted BLE.Lindsey [name]  Dermatological Examination: Pedal skin with normal turgor, texture and tone b/l. No open wounds. No interdigital macerations b/l. Toenails 1-5 b/l thickened, discolored, dystrophic with subungual debris. There is pain on palpation to dorsal aspect of nailplates. No hyperkeratotic nor porokeratotic lesions. No corns, calluses, nor porokeratotic lesions.  Neurological Examination: Protective sensation intact with 10 gram monofilament b/l LE. Vibratory sensation intact b/l LE.   Musculoskeletal Examination: Muscle strength 5/5 to all lower extremity muscle groups bilaterally. No pain, crepitus or joint limitation noted with ROM bilateral LE.     04/22/2024   12:00 AM 01/06/2024    3:45 PM 09/06/2023   11:56 AM  Hemoglobin A1C  Hemoglobin-A1c 7.5     8.0  7.4      This result is from an external source.   Assessment/Plan: 1. Pain due to onychomycosis of toenails of both feet    Patient was evaluated and treated. All patient's and/or POA's questions/concerns addressed on today's visit.  Mycotic toenails 1-5 debrided in length and girth without incident. Continue soft, supportive shoe gear daily. Report any pedal injuries to medical professional. Call office if there are any quesitons/concerns. -Patient/POA to call should there be question/concern in the interim.   Return in about 3 months (around 08/25/2024).  Lindsey [name], DPM      [place] LOCATION: [address] [place] [address]                   Office [phone]   [facility] LOCATION: [address] [place], [place] [address] Office [phone]

## 2024-06-18 ENCOUNTER — Other Ambulatory Visit: Payer: Self-pay | Admitting: Internal Medicine

## 2024-06-18 DIAGNOSIS — M109 Gout, unspecified: Secondary | ICD-10-CM

## 2024-06-18 NOTE — Telephone Encounter (Signed)
 Requested medications are due for refill today.  yes  Requested medications are on the active medications list.  yes  Last refill. 05/20/2024 #60 0 rf  Future visit scheduled.   yes  Notes to clinic.  Expired labs.    Requested Prescriptions  Pending Prescriptions Disp Refills   allopurinol  (ZYLOPRIM ) 100 MG tablet [Pharmacy Med Name: Allopurinol  100 MG Oral Tablet] 60 tablet 0    Sig: Take 1 tablet by mouth twice daily     Endocrinology:  Gout Agents - allopurinol  Failed - 06/18/2024  2:24 PM      Failed - Uric Acid in normal range and within 360 days    Uric Acid, Serum  Date Value Ref Range Status  01/04/2023 3.4 2.5 - 7.0 mg/dL Final    Comment:    Therapeutic target for gout patients: <6.0 mg/dL .    Uric Acid  Date Value Ref Range Status  12/15/2015 8.1 (H) 2.5 - 7.1 mg/dL Final    Comment:               Therapeutic target for gout patients: <6.0         Passed - Cr in normal range and within 360 days    Creat  Date Value Ref Range Status  09/06/2023 0.91 0.60 - 1.00 mg/dL Final   Creatinine, Ser  Date Value Ref Range Status  05/07/2024 0.82 0.44 - 1.00 mg/dL Final   Creatinine, Urine  Date Value Ref Range Status  01/08/2024 48 20 - 275 mg/dL Final         Passed - Valid encounter within last 12 months    Recent Outpatient Visits           1 month ago Dementia with behavioral disturbance [facility])   [facility] Glenard Mire, MD   2 months ago Hallucinations   [facility] Gareth Clarity F, FNP   5 months ago Type 2 diabetes mellitus with microalbuminuria, without long-term current use of insulin  [facility])   [facility] Glenard Mire, MD       Future Appointments             In 2 months Glenard, Krichna, MD [facility] [place]            Passed - CBC within normal limits and completed in the last 12 months    WBC  Date Value Ref Range  Status  05/07/2024 5.9 4.0 - 10.5 K/uL Final   RBC  Date Value Ref Range Status  05/07/2024 3.39 (L) 3.87 - 5.11 MIL/uL Final   Hemoglobin  Date Value Ref Range Status  05/07/2024 9.8 (L) 12.0 - 15.0 g/dL Final  96/97/7982 89.0 (L) 11.1 - 15.9 g/dL Final   HCT  Date Value Ref Range Status  05/07/2024 31.3 (L) 36.0 - 46.0 % Final   Hematocrit  Date Value Ref Range Status  12/15/2015 33.7 (L) 34.0 - 46.6 % Final   MCHC  Date Value Ref Range Status  05/07/2024 31.3 30.0 - 36.0 g/dL Final   [facility]  Date Value Ref Range Status  05/07/2024 28.9 26.0 - 34.0 pg Final   MCV  Date Value Ref Range Status  05/07/2024 92.3 80.0 - 100.0 fL Final  12/15/2015 84 79 - 97 fL Final   No results found for: PLTCOUNTKUC, LABPLAT, POCPLA RDW  Date Value Ref Range Status  05/07/2024 13.2 11.5 - 15.5 % Final  12/15/2015 14.8  12.3 - 15.4 % Final

## 2024-07-01 ENCOUNTER — Encounter: Payer: Self-pay | Admitting: Family Medicine

## 2024-07-02 ENCOUNTER — Other Ambulatory Visit: Payer: Self-pay | Admitting: Family Medicine

## 2024-07-02 DIAGNOSIS — E1129 Type 2 diabetes mellitus with other diabetic kidney complication: Secondary | ICD-10-CM

## 2024-07-02 DIAGNOSIS — N1831 Chronic kidney disease, stage 3a: Secondary | ICD-10-CM

## 2024-07-17 ENCOUNTER — Other Ambulatory Visit: Payer: Self-pay | Admitting: Family Medicine

## 2024-07-17 DIAGNOSIS — M109 Gout, unspecified: Secondary | ICD-10-CM

## 2024-08-11 ENCOUNTER — Encounter: Payer: Self-pay | Admitting: Family Medicine

## 2024-08-20 ENCOUNTER — Other Ambulatory Visit: Payer: Self-pay | Admitting: Family Medicine

## 2024-08-20 DIAGNOSIS — M109 Gout, unspecified: Secondary | ICD-10-CM

## 2024-08-23 ENCOUNTER — Other Ambulatory Visit: Payer: Self-pay | Admitting: Family Medicine

## 2024-08-23 DIAGNOSIS — I10 Essential (primary) hypertension: Secondary | ICD-10-CM

## 2024-08-24 NOTE — Telephone Encounter (Signed)
 Has appt 11/25

## 2024-08-31 ENCOUNTER — Encounter: Payer: Self-pay | Admitting: Podiatry

## 2024-08-31 ENCOUNTER — Ambulatory Visit (INDEPENDENT_AMBULATORY_CARE_PROVIDER_SITE_OTHER): Admitting: Podiatry

## 2024-08-31 DIAGNOSIS — M79674 Pain in right toe(s): Secondary | ICD-10-CM

## 2024-08-31 DIAGNOSIS — M79675 Pain in left toe(s): Secondary | ICD-10-CM | POA: Diagnosis not present

## 2024-08-31 DIAGNOSIS — E119 Type 2 diabetes mellitus without complications: Secondary | ICD-10-CM

## 2024-08-31 DIAGNOSIS — B351 Tinea unguium: Secondary | ICD-10-CM | POA: Diagnosis not present

## 2024-08-31 DIAGNOSIS — Z0189 Encounter for other specified special examinations: Secondary | ICD-10-CM

## 2024-08-31 DIAGNOSIS — E1122 Type 2 diabetes mellitus with diabetic chronic kidney disease: Secondary | ICD-10-CM

## 2024-08-31 DIAGNOSIS — N1831 Chronic kidney disease, stage 3a: Secondary | ICD-10-CM

## 2024-09-01 ENCOUNTER — Ambulatory Visit: Admitting: Family Medicine

## 2024-09-06 NOTE — Progress Notes (Signed)
  Subjective:  Patient ID: Lindsey [name], female    DOB: Nov 13, 1950,  MRN: [id number]  Lindsey [name] presents to clinic today for for annual diabetic foot examination and painful thick toenails that are difficult to trim. Pain interferes with ambulation. Aggravating factors include wearing enclosed shoe gear. Pain is relieved with periodic professional debridement.  Chief Complaint  Patient presents with   Diabetes    DFC. Dr. Glenard is her PCP. Last visit in July. A1c 7.5   New problem(s): None.   PCP is Sowles, Krichna, MD.  Allergies  Allergen Reactions   Ace Inhibitors     Review of Systems: Negative except as noted in the HPI.  Objective: No changes noted in today's physical examination. There were no vitals filed for this visit. Lindsey [name] is a pleasant 73 y.o. female WD, WN in NAD. AAO x 3.   Diabetic foot exam was performed with the following findings:   Normal sensation of 10g monofilament Intact posterior tibialis and dorsalis pedis pulses Vascular Examination: Capillary refill time immediate b/l. Palpable pedal pulses. Pedal hair present b/l. Pedal edema trace b/l. No pain with calf compression b/l. Skin temperature gradient WNL b/l. No cyanosis or clubbing b/l. No ischemia or gangrene noted b/l.   Neurological Examination: Sensation grossly intact b/l with 10 gram monofilament. Vibratory sensation intact b/l.   Dermatological Examination: Pedal skin with normal turgor, texture and tone b/l.  No open wounds. No interdigital macerations.   Toenails 1-5 b/l thick, discolored, elongated with subungual debris and pain on dorsal palpation.   No hyperkeratotic nor porokeratotic lesions.  Musculoskeletal Examination: Muscle strength 5/5 to all lower extremity muscle groups bilaterally. No pain, crepitus or joint limitation noted with ROM bilateral LE. No gross bony deformities bilaterally.  Radiographs: None     Assessment/Plan: 1. Pain due to onychomycosis of  toenails of both feet   2. Type 2 diabetes mellitus with stage 3a chronic kidney disease, without long-term current use of insulin  (HCC)   3. Encounter for diabetic foot exam (HCC)   Diabetic foot examination performed today. All patient's and/or POA's questions/concerns addressed on today's visit. Toenails 1-5 b/l debrided in length and girth without incident. Continue foot and shoe inspections daily. Monitor blood glucose per PCP/Endocrinologist's recommendations. Continue soft, supportive shoe gear daily. Report any pedal injuries to medical professional. Call office if there are any questions/concerns. -Patient/POA to call should there be question/concern in the interim.   Return in about 3 months (around 12/01/2024).  Lindsey [name], DPM      [place] LOCATION: [address] [place] [address]                   Office [phone]   [facility] LOCATION: [address] [place], [place] [address] Office [phone]

## 2024-09-08 ENCOUNTER — Encounter: Payer: Self-pay | Admitting: Family Medicine

## 2024-09-08 ENCOUNTER — Ambulatory Visit: Admitting: Family Medicine

## 2024-09-08 VITALS — BP 132/84 | HR 97 | Temp 97.9°F | Resp 18 | Ht 59.0 in | Wt 147.2 lb

## 2024-09-08 DIAGNOSIS — Z7984 Long term (current) use of oral hypoglycemic drugs: Secondary | ICD-10-CM

## 2024-09-08 DIAGNOSIS — E1169 Type 2 diabetes mellitus with other specified complication: Secondary | ICD-10-CM

## 2024-09-08 DIAGNOSIS — N390 Urinary tract infection, site not specified: Secondary | ICD-10-CM

## 2024-09-08 DIAGNOSIS — I129 Hypertensive chronic kidney disease with stage 1 through stage 4 chronic kidney disease, or unspecified chronic kidney disease: Secondary | ICD-10-CM

## 2024-09-08 DIAGNOSIS — E1122 Type 2 diabetes mellitus with diabetic chronic kidney disease: Secondary | ICD-10-CM

## 2024-09-08 DIAGNOSIS — M109 Gout, unspecified: Secondary | ICD-10-CM

## 2024-09-08 DIAGNOSIS — N1831 Chronic kidney disease, stage 3a: Secondary | ICD-10-CM

## 2024-09-08 DIAGNOSIS — F03918 Unspecified dementia, unspecified severity, with other behavioral disturbance: Secondary | ICD-10-CM

## 2024-09-08 DIAGNOSIS — E785 Hyperlipidemia, unspecified: Secondary | ICD-10-CM

## 2024-09-08 LAB — POCT GLYCOSYLATED HEMOGLOBIN (HGB A1C): Hemoglobin A1C: 6.9 % — AB (ref 4.0–5.6)

## 2024-09-08 MED ORDER — EMPAGLIFLOZIN 25 MG PO TABS: 25.0000 mg | ORAL_TABLET | Freq: Every day | ORAL | 1 refills | Status: AC

## 2024-09-08 MED ORDER — METFORMIN HCL ER 500 MG PO TB24
500.0000 mg | ORAL_TABLET | Freq: Two times a day (BID) | ORAL | 1 refills | Status: AC
Start: 2024-09-08 — End: ?

## 2024-09-08 MED ORDER — LOSARTAN POTASSIUM 25 MG PO TABS: 25.0000 mg | ORAL_TABLET | Freq: Every day | ORAL | 1 refills | Status: AC

## 2024-09-08 MED ORDER — CARVEDILOL 6.25 MG PO TABS: 6.2500 mg | ORAL_TABLET | Freq: Two times a day (BID) | ORAL | 1 refills | Status: AC

## 2024-09-08 MED ORDER — SULFAMETHOXAZOLE-TRIMETHOPRIM 400-80 MG PO TABS: 1.0000 | ORAL_TABLET | ORAL | 0 refills | Status: AC

## 2024-09-08 MED ORDER — PIOGLITAZONE HCL 15 MG PO TABS: 15.0000 mg | ORAL_TABLET | Freq: Every day | ORAL | 1 refills | Status: AC

## 2024-09-08 MED ORDER — ALLOPURINOL 100 MG PO TABS
100.0000 mg | ORAL_TABLET | Freq: Two times a day (BID) | ORAL | 1 refills | Status: AC
Start: 2024-09-08 — End: ?

## 2024-09-08 MED ORDER — ROSUVASTATIN CALCIUM 20 MG PO TABS: 20.0000 mg | ORAL_TABLET | Freq: Every day | ORAL | 1 refills | Status: AC

## 2024-09-08 NOTE — Progress Notes (Signed)
 Name: Lindsey [name]   MRN: [id number]    DOB: 09-16-1951   Date:09/08/2024       Progress Note  Subjective  Chief Complaint  Chief Complaint  Patient presents with   Medical Management of Chronic Issues   Discussed the use of AI scribe software for clinical note transcription with the patient, who gave verbal consent to proceed.  History of Present Illness Lindsey [name] is a 73 year old female with dementia, type 2 diabetes, and hypertension who presents for a follow-up visit.  She has a history of dementia with behavioral disturbances and is currently taking Namenda  10 mg twice a day and Aricept  10 mg daily. Seroquel  100 mg is administered three times a week, which has helped reduce her visual hallucinations. She tolerates the medication well and is not wandering. She has a 24-hour aide to assist her and follows up with neurology at [facility] via telemedicine and at the local clinic for medication management.  Her type 2 diabetes is managed with Jardiance  25 mg, metformin  500 mg XR twice daily, and pioglitazone . She is not experiencing any side effects from these medications. Her diabetes is associated with dyslipidemia, for which she takes rosuvastatin . No muscle aches or issues related to this medication are reported.  Her hypertension is managed with carvedilol  3.125 mg and losartan  25 mg. Her blood pressure was 132/84 mmHg today, and in September it was 136/80 mmHg. She recently refilled her carvedilol  and has losartan  available for refill.  She has a history of gout, controlled with allopurinol  twice daily, and has not had any recent gout attacks. She was hospitalized previously for UTIs and is currently taking Bactrim  once a week.  She is also on sertraline , which is managed by her neurology team. She is voiding well. She received her flu and pneumonia vaccines at [facility].    Patient Active Problem List   Diagnosis Date Noted   UTI (urinary tract infection) 05/06/2024   HTN  (hypertension) 05/06/2024   Gout 05/06/2024   Depression with anxiety 05/06/2024   Overweight (BMI 25.0-29.9) 05/06/2024   Hypomagnesemia 05/06/2024   Prolonged QT interval 05/06/2024   Type II diabetes mellitus with renal manifestations (HCC) 05/06/2024   Pancreatic cyst 05/01/2024   Chronic constipation 05/01/2024   Renal cyst, right 05/01/2024   Vitamin D  deficiency 05/01/2024   Small vessel disease 05/01/2024   Atherosclerosis of aorta 05/01/2024   Fall 08/05/2023   RSV infection 11/15/2022   Mild protein-calorie malnutrition 05/08/2022   Stage 3a chronic kidney disease (HCC) 05/08/2022   Type 2 diabetes mellitus with microalbuminuria, without long-term current use of insulin  (HCC) 05/08/2022   Pain due to onychomycosis of toenails of both feet 05/04/2019   Dementia with behavioral disturbance (HCC) 01/09/2016   Mild dementia (HCC) 11/01/2015   Auditory hallucination 07/26/2015   Anemia of chronic disease 07/05/2015   Cataract 04/06/2015   Type 2 diabetes mellitus with diabetic cataract (HCC) 04/06/2015   Benign essential HTN 04/02/2015   Controlled gout 04/02/2015   Dyslipidemia 04/02/2015   Fibromyalgia syndrome 04/02/2015   Chronic insomnia 04/02/2015   Cognitive decline 04/02/2015   Nodular basal cell carcinoma 04/02/2015   Osteopenia 04/02/2015   Calcium  blood increased 04/02/2015    Past Surgical History:  Procedure Laterality Date   CESAREAN SECTION     TONSILECTOMY, ADENOIDECTOMY, BILATERAL MYRINGOTOMY AND TUBES     TUBAL LIGATION      Family History  Problem Relation Age of Onset   Hypertension Mother  Osteoporosis Mother    Cancer Mother        brain tumor   Hodgkin's lymphoma Father    Early death Father    Hypertension Sister    Cancer Sister        unknown   Hypertension Sister    Diabetes Sister    Cancer Sister 72       breast   Breast cancer Sister 76   Cancer Maternal Uncle     Social History   Tobacco Use   Smoking status: Never    Smokeless tobacco: Never   Tobacco comments:    smoking cessation materials not required  Substance Use Topics   Alcohol use: No     Current Outpatient Medications:    allopurinol  (ZYLOPRIM ) 100 MG tablet, Take 1 tablet by mouth twice daily, Disp: 60 tablet, Rfl: 0   carvedilol  (COREG ) 3.125 MG tablet, TAKE 1 TABLET BY MOUTH TWICE DAILY WITH MEALS, Disp: 60 tablet, Rfl: 0   cholecalciferol  (VITAMIN D3) 25 MCG (1000 UNIT) tablet, Take 1 tablet (1,000 Units total) by mouth daily., Disp: , Rfl:    donepezil  (ARICEPT ) 10 MG tablet, Take 1 tablet (10 mg total) by mouth at bedtime. (Patient taking differently: Take 10 mg by mouth daily. In the morning), Disp: 90 tablet, Rfl: 1   empagliflozin  (JARDIANCE ) 25 MG TABS tablet, Take 1 tablet (25 mg total) by mouth daily., Disp: 90 tablet, Rfl: 1   losartan  (COZAAR ) 25 MG tablet, Take 1 tablet by mouth once daily, Disp: 90 tablet, Rfl: 0   memantine  (NAMENDA ) 10 MG tablet, Take 10 mg by mouth 2 (two) times daily., Disp: , Rfl:    metFORMIN  (GLUCOPHAGE -XR) 500 MG 24 hr tablet, Take 1 tablet (500 mg total) by mouth 2 (two) times daily with a meal., Disp: 180 tablet, Rfl: 1   MULTIPLE VITAMIN PO, Take 1 tablet by mouth daily., Disp: , Rfl:    pioglitazone  (ACTOS ) 15 MG tablet, Take 1 tablet (15 mg total) by mouth daily., Disp: 90 tablet, Rfl: 1   polyethylene glycol powder (GLYCOLAX /MIRALAX ) 17 GM/SCOOP powder, Take 17 g by mouth daily., Disp: 3350 g, Rfl: 1   QUEtiapine  (SEROQUEL ) 100 MG tablet, Take 1 tablet (100 mg total) by mouth at bedtime. (Patient taking differently: Take 100 mg by mouth at bedtime. Morning pt take 50 mg, early evening 150 mg, and at the bedtime 100 mg.), Disp: 30 tablet, Rfl: 0   rosuvastatin  (CRESTOR ) 20 MG tablet, Take 1 tablet (20 mg total) by mouth daily., Disp: 90 tablet, Rfl: 1   sertraline  (ZOLOFT ) 50 MG tablet, Take 50 mg by mouth every evening., Disp: , Rfl:    sulfamethoxazole -trimethoprim  (BACTRIM ) 400-80 MG tablet,  Take 1 tablet by mouth once a week., Disp: 15 tablet, Rfl: 0   feeding supplement (ENSURE ENLIVE / ENSURE PLUS) LIQD, Take 237 mLs by mouth 3 (three) times daily between meals., Disp: 237 mL, Rfl: 12   sulfamethoxazole -trimethoprim  (BACTRIM  DS) 800-160 MG tablet, Take 1 tablet by mouth 2 (two) times daily., Disp: 6 tablet, Rfl: 0  Allergies  Allergen Reactions   Ace Inhibitors     I personally reviewed active problem list, medication list, allergies, family history with the patient/caregiver today.   ROS  Ten systems reviewed and is negative except as mentioned in HPI    Objective Physical Exam VITALS: BP- 132/84 MEASUREMENTS: Weight- 147, BMI- 29.73. CONSTITUTIONAL: Patient appears well-developed and well-nourished. No distress. HEENT: Head atraumatic, normocephalic, neck supple. CARDIOVASCULAR: Normal rate,  regular rhythm and normal heart sounds. No murmur heard. No BLE edema. Toenail infection on big toe, non-harmful. PULMONARY: Effort normal and breath sounds normal. No respiratory distress. Lungs clear to auscultation, no wheezing. ABDOMINAL: There is no tenderness or distention. MUSCULOSKELETAL: Normal gait. Without gross motor or sensory deficit. PSYCHIATRIC: Patient has a normal mood and affect. Behavior is normal. Judgment and thought content normal.  Vitals:   09/08/24 0956  BP: 132/84  Pulse: 97  Resp: 18  Temp: 97.9 F (36.6 C)  TempSrc: Oral  SpO2: 100%  Weight: 147 lb 3.2 oz (66.8 kg)  Height: 4' 11 (1.499 m)    Body mass index is 29.73 kg/m.  Recent Results (from the past 2160 hours)  POCT glycosylated hemoglobin (Hb A1C)     Status: Abnormal   Collection Time: 09/08/24 10:01 AM  Result Value Ref Range   Hemoglobin A1C 6.9 (A) 4.0 - 5.6 %   HbA1c POC (<> result, manual entry)     HbA1c, POC (prediabetic range)     HbA1c, POC (controlled diabetic range)      Diabetic Foot Exam:  Diabetic foot exam was performed with the following findings:    Normal sensation of 10g monofilament Intact posterior tibialis and dorsalis pedis pulses Onychomycosis first toes bilaterally       PHQ2/9:    09/08/2024    9:55 AM 05/01/2024    9:58 AM 01/06/2024    3:18 PM 09/06/2023   11:54 AM 05/30/2023   11:18 AM  Depression screen PHQ 2/9  Decreased Interest 0 0 0 0 0  Down, Depressed, Hopeless 0 0 0 0 0  PHQ - 2 Score 0 0 0 0 0  Altered sleeping 0  0 0 0  Tired, decreased energy 0  0 0 0  Change in appetite 0  0 0 0  Feeling bad or failure about yourself  0  0 0 0  Trouble concentrating 0  0 0 0  Moving slowly or fidgety/restless 0  0 0 0  Suicidal thoughts 0  0 0 0  PHQ-9 Score 0  0  0  0   Difficult doing work/chores Not difficult at all  Not difficult at all       Data saved with a previous flowsheet row definition    phq 9 is negative  Fall Risk:    09/08/2024    9:55 AM 05/01/2024    9:58 AM 01/06/2024    3:18 PM 09/06/2023   11:54 AM 05/30/2023   11:18 AM  Fall Risk   Falls in the past year? 0 1 0 0 0  Number falls in past yr: 0 1 0  0  Injury with Fall? 0 1 0  0  Risk for fall due to : No Fall Risks Impaired balance/gait  No Fall Risks No Fall Risks  Follow up Falls evaluation completed Falls evaluation completed Falls evaluation completed Falls prevention discussed Falls prevention discussed      Assessment & Plan Dementia with behavioral disturbance Dementia stable, no significant progression. Visual hallucinations improved with Seroquel . - Continue Namenda  10 mg twice daily. - Continue Aricept  10 mg daily. - Continue Seroquel  100 mg three times a week.  Type 2 diabetes mellitus with dyslipidemia Diabetes managed with Jardiance , metformin , and pioglitazone . Medications chosen for kidney protection. - Continue Jardiance  25 mg daily. - Continue metformin  XR twice daily. - Continue pioglitazone  as prescribed.  Hypertension with chronic kidney disease stage 3a Blood pressure slightly above goal. Heart  rate  slightly elevated. Losartan  preferred for kidney protection. Carvedilol  increased for heart rate control. - Increased carvedilol  to 6.25 mg daily after current supply is finished. - Continue losartan  25 mg daily.  Gout, controlled Gout well-controlled with allopurinol . - Continue allopurinol  twice daily.  Recurrent urinary tract infections Recurrent UTIs managed effectively with Bactrim . - Continue Bactrim  once a week.  General Health Maintenance

## 2024-09-23 ENCOUNTER — Encounter: Payer: Self-pay | Admitting: Family Medicine

## 2024-09-24 ENCOUNTER — Other Ambulatory Visit: Payer: Self-pay | Admitting: Family Medicine

## 2024-09-24 DIAGNOSIS — I10 Essential (primary) hypertension: Secondary | ICD-10-CM

## 2024-09-25 NOTE — Telephone Encounter (Signed)
 Too soon for refill, refilled 09/08/24.  Requested Prescriptions  Pending Prescriptions Disp Refills   carvedilol  (COREG ) 3.125 MG tablet [Pharmacy Med Name: Carvedilol  3.125 MG Oral Tablet] 60 tablet 0    Sig: TAKE 1 TABLET BY MOUTH TWICE DAILY WITH MEALS     Cardiovascular: Beta Blockers 3 Passed - 09/25/2024  3:28 PM      Passed - Cr in normal range and within 360 days    Creat  Date Value Ref Range Status  09/06/2023 0.91 0.60 - 1.00 mg/dL Final   Creatinine, Ser  Date Value Ref Range Status  05/07/2024 0.82 0.44 - 1.00 mg/dL Final   Creatinine, Urine  Date Value Ref Range Status  01/08/2024 48 20 - 275 mg/dL Final         Passed - AST in normal range and within 360 days    AST  Date Value Ref Range Status  05/06/2024 35 15 - 41 U/L Final         Passed - ALT in normal range and within 360 days    ALT  Date Value Ref Range Status  05/06/2024 28 0 - 44 U/L Final         Passed - Last BP in normal range    BP Readings from Last 1 Encounters:  09/08/24 132/84         Passed - Last Heart Rate in normal range    Pulse Readings from Last 1 Encounters:  09/08/24 97         Passed - Valid encounter within last 6 months    Recent Outpatient Visits           2 weeks ago Dementia with behavioral disturbance [facility])   [facility] Glenard Mire, MD   4 months ago Dementia with behavioral disturbance [facility])   [facility] Glenard Mire, MD   5 months ago Hallucinations   [facility] Gareth Clarity F, FNP   8 months ago Type 2 diabetes mellitus with microalbuminuria, without long-term current use of insulin  [facility])   [facility] Glenard Mire, MD

## 2024-10-23 ENCOUNTER — Ambulatory Visit

## 2024-10-23 DIAGNOSIS — Z0000 Encounter for general adult medical examination without abnormal findings: Secondary | ICD-10-CM | POA: Diagnosis not present

## 2024-10-23 NOTE — Patient Instructions (Addendum)
 [name],  Thank you for taking the time for your Medicare Wellness Visit. I appreciate your continued commitment to your health goals. Please review the care plan we discussed, and feel free to reach out if I can assist you further.  Please note that Annual Wellness Visits do not include a physical exam. Some assessments may be limited, especially if the visit was conducted virtually. If needed, we may recommend an in-person follow-up with your provider.  Ongoing Care Seeing your primary care provider every 3 to 6 months helps us  monitor your health and provide consistent, personalized care. APPT ON 03/09/25 @ 1:40 W/ DR.SOWLES  Referrals If a referral was made during today's visit and you haven't received any updates within two weeks, please contact the referred provider directly to check on the status.  Recommended Screenings:  Health Maintenance  Topic Date Due   COVID-19 Vaccine (6 - 2025-26 season) 06/15/2024   DTaP/Tdap/Td vaccine (3 - Td or Tdap) 11/19/2024   Yearly kidney health urinalysis for diabetes  01/07/2025   Eye exam for diabetics  02/11/2025   Hemoglobin A1C  03/08/2025   Yearly kidney function blood test for diabetes  05/07/2025   Complete foot exam   08/31/2025   Medicare Annual Wellness Visit  10/23/2025   Osteoporosis screening with Bone Density Scan  03/18/2028   Pneumococcal Vaccine for age over 27  Completed   Flu Shot  Completed   Hepatitis C Screening  Completed   Zoster (Shingles) Vaccine  Completed   Meningitis B Vaccine  Aged Out   Breast Cancer Screening  Discontinued   Colon Cancer Screening  Discontinued     Vision: Annual vision screenings are recommended for early detection of glaucoma, cataracts, and diabetic retinopathy. These exams can also reveal signs of chronic conditions such as diabetes and high blood pressure.  Dental: Annual dental screenings help detect early signs of oral cancer, gum disease, and other conditions linked to overall  health, including heart disease and diabetes.  Please see the attached documents for additional preventive care recommendations.   NEXT AWV 10/29/25 @ 11:30 AM BY VIDEO W/ DAUGHTER

## 2024-10-23 NOTE — Progress Notes (Signed)
 "  No chief complaint on file.    Subjective:   Lindsey [name] is a 74 y.o. female who presents for a Medicare Annual Wellness Visit.  Visit info / Clinical Intake: Medicare Wellness Visit Type:: Subsequent Annual Wellness Visit Persons participating in visit and providing information:: patient & caregiver Medicare Wellness Visit Mode:: Telephone If telephone:: video declined Since this visit was completed virtually, some vitals may be partially provided or unavailable. Missing vitals are due to the limitations of the virtual format.: Unable to obtain vitals - no equipment If Telephone or Video please confirm:: I connected with patient using audio/video enable telemedicine. I verified patient identity with two identifiers, discussed telehealth limitations, and patient agreed to proceed. Patient Location:: HOME Provider Location:: OFFICE Interpreter Needed?: No Pre-visit prep was completed: yes AWV questionnaire completed by patient prior to visit?: no Living arrangements:: with family/others Patient's Overall Health Status Rating: (!) fair Typical amount of pain: some Does pain affect daily life?: no Are you currently prescribed opioids?: no  Dietary Habits and Nutritional Risks How many meals a day?: 3 (SNACKS IN BETWEEN) Eats fruit and vegetables daily?: yes Most meals are obtained by: having others provide food In the last 2 weeks, have you had any of the following?: none Diabetic:: (!) yes Any non-healing wounds?: no How often do you check your BS?: 0 Would you like to be referred to a Nutritionist or for Diabetic Management? : no  Functional Status Activities of Daily Living (to include ambulation/medication): (!) Needs Assist Feeding: Independent Dressing/Grooming: Needs assistance Bathing: Needs assistance Toileting: Needs assistance Transfer: Independent Ambulation: Needs Assistance Medication Administration: Dependent Is this a change from baseline?: Pre-admission  baseline Home Management (perform basic housework or laundry): Dependent Manage your own finances?: (!) no Primary transportation is: family / friends Concerns about vision?: no *vision screening is required for [facility]* ([facility]ES [facility]- [facility]) Concerns about hearing?: no  Fall Screening Falls in the past year?: 1 Number of falls in past year: 0 Was there an injury with Fall?: 0 Fall Risk Category Calculator: 1 Patient Fall Risk Level: Low Fall Risk  Fall Risk Patient at Risk for Falls Due to: History of fall(s); Impaired mobility; Impaired balance/gait Fall risk Follow up: Falls evaluation completed; Falls prevention discussed  Home and Transportation Safety: All rugs have non-skid backing?: N/A, no rugs All stairs or steps have railings?: yes Grab bars in the bathtub or shower?: yes Have non-skid surface in bathtub or shower?: yes Good home lighting?: yes Regular seat belt use?: yes Hospital stays in the last year:: (!) yes How many hospital stays:: 2 Reason: UTI W/ FALL; UTI FOR 2ND ADMISSION  Cognitive Assessment Difficulty concentrating, remembering, or making decisions? : yes Will 6CIT or Mini Cog be Completed: no 6CIT or Mini Cog Declined: patient has a diagnosis of dementia or cognitive impairment  Advance Directives (For Healthcare) Does Patient Have a Medical Advance Directive?: No Would patient like information on creating a medical advance directive?: No - Patient declined  Reviewed/Updated  Reviewed/Updated: Reviewed All (Medical, Surgical, Family, Medications, Allergies, Care Teams, Patient Goals)    Allergies (verified) Ace inhibitors   Current Medications (verified) Outpatient Encounter Medications as of 10/23/2024  Medication Sig   allopurinol  (ZYLOPRIM ) 100 MG tablet Take 1 tablet (100 mg total) by mouth 2 (two) times daily.   carvedilol  (COREG ) 6.25 MG tablet Take 1 tablet (6.25 mg total) by mouth 2 (two) times daily with a meal.    cholecalciferol  (VITAMIN D3) 25 MCG (  1000 UNIT) tablet Take 1 tablet (1,000 Units total) by mouth daily.   donepezil  (ARICEPT ) 10 MG tablet Take 1 tablet (10 mg total) by mouth at bedtime.   empagliflozin  (JARDIANCE ) 25 MG TABS tablet Take 1 tablet (25 mg total) by mouth daily.   losartan  (COZAAR ) 25 MG tablet Take 1 tablet (25 mg total) by mouth daily.   memantine  (NAMENDA ) 10 MG tablet Take 10 mg by mouth 2 (two) times daily.   metFORMIN  (GLUCOPHAGE -XR) 500 MG 24 hr tablet Take 1 tablet (500 mg total) by mouth 2 (two) times daily with a meal.   MULTIPLE VITAMIN PO Take 1 tablet by mouth daily.   pioglitazone  (ACTOS ) 15 MG tablet Take 1 tablet (15 mg total) by mouth daily.   polyethylene glycol powder (GLYCOLAX /MIRALAX ) 17 GM/SCOOP powder Take 17 g by mouth daily.   QUEtiapine  (SEROQUEL ) 100 MG tablet Take 1 tablet (100 mg total) by mouth at bedtime.   rosuvastatin  (CRESTOR ) 20 MG tablet Take 1 tablet (20 mg total) by mouth daily.   sertraline  (ZOLOFT ) 50 MG tablet Take 50 mg by mouth every evening.   sulfamethoxazole -trimethoprim  (BACTRIM ) 400-80 MG tablet Take 1 tablet by mouth once a week.   No facility-administered encounter medications on file as of 10/23/2024.    History: Past Medical History:  Diagnosis Date   Acute gout    Allergy ace inhibitors   Anemia of chronic disease    Anxiety    Basal cell carcinoma    Cataract    Depression, major, recurrent, mild    Diabetes mellitus without complication (HCC)    Fibromyalgia syndrome    Hyperlipidemia    Hypertension    Osteoporosis    Retinopathy    Stage 3a chronic kidney disease (HCC) 05/08/2022   Past Surgical History:  Procedure Laterality Date   CESAREAN SECTION     TONSILECTOMY, ADENOIDECTOMY, BILATERAL MYRINGOTOMY AND TUBES     TUBAL LIGATION     Family History  Problem Relation Age of Onset   Hypertension Mother    Osteoporosis Mother    Cancer Mother        brain tumor   Hodgkin's lymphoma Father    Early  death Father    Hypertension Sister    Cancer Sister        unknown   Hypertension Sister    Diabetes Sister    Cancer Sister 86       breast   Breast cancer Sister 63   Cancer Maternal Uncle    Social History   Occupational History    Employer: RETIRED  Tobacco Use   Smoking status: Never   Smokeless tobacco: Never   Tobacco comments:    smoking cessation materials not required  Vaping Use   Vaping status: Never Used  Substance and Sexual Activity   Alcohol use: No   Drug use: No   Sexual activity: Not Currently   Tobacco Counseling Counseling given: Not Answered Tobacco comments: smoking cessation materials not required  SDOH Screenings   Food Insecurity: No Food Insecurity (09/04/2024)  Housing: Unknown (09/04/2024)  Transportation Needs: No Transportation Needs (09/04/2024)  Utilities: Not At Risk (04/21/2024)   Received from [facility]  Alcohol Screen: Low Risk (08/22/2022)  Depression (PHQ2-9): Low Risk (10/23/2024)  Financial Resource Strain: Low Risk (09/04/2024)  Physical Activity: Insufficiently Active (09/04/2024)  Social Connections: Socially Isolated (09/04/2024)  Stress: No Stress Concern Present (10/23/2024)  Tobacco Use: Low Risk (09/08/2024)  Recent Concern: Tobacco Use -  Medium Risk (07/07/2024)   Received from [facility]   See flowsheets for full screening details  Depression Screen PHQ 2 & 9 Depression Scale- Over the past 2 weeks, how often have you been bothered by any of the following problems? Little interest or pleasure in doing things: 0 Feeling down, depressed, or hopeless (PHQ Adolescent also includes...irritable): 0 PHQ-2 Total Score: 0 Trouble falling or staying asleep, or sleeping too much: 0 Feeling tired or having little energy: 0 Poor appetite or overeating (PHQ Adolescent also includes...weight loss): 0 Feeling bad about yourself - or that you are a failure or have let yourself or your family  down: 0 Trouble concentrating on things, such as reading the newspaper or watching television (PHQ Adolescent also includes...like school work): 0 Moving or speaking so slowly that other people could have noticed. Or the opposite - being so fidgety or restless that you have been moving around a lot more than usual: 0 Thoughts that you would be better off dead, or of hurting yourself in some way: 0 PHQ-9 Total Score: 0 If you checked off any problems, how difficult have these problems made it for you to do your work, take care of things at home, or get along with other people?: Not difficult at all     Goals Addressed             This Visit's Progress    DIET - EAT MORE FRUITS AND VEGETABLES               Objective:    There were no vitals filed for this visit. There is no height or weight on file to calculate BMI.  Hearing/Vision screen Hearing Screening - Comments:: NO AIDS Vision Screening - Comments:: GLASSES- [facility]- YEARLY  Immunizations and Health Maintenance Health Maintenance  Topic Date Due   COVID-19 Vaccine (6 - 2025-26 season) 06/15/2024   DTaP/Tdap/Td (3 - Td or Tdap) 11/19/2024   Diabetic kidney evaluation - Urine ACR  01/07/2025   OPHTHALMOLOGY EXAM  02/11/2025   HEMOGLOBIN A1C  03/08/2025   Diabetic kidney evaluation - eGFR measurement  05/07/2025   FOOT EXAM  08/31/2025   Medicare Annual Wellness (AWV)  10/23/2025   Pneumococcal Vaccine: 50+ Years  Completed   Influenza Vaccine  Completed   Bone Density Scan  Completed   Hepatitis C Screening  Completed   Zoster Vaccines- Shingrix  Completed   Meningococcal B Vaccine  Aged Out   Mammogram  Discontinued   Colonoscopy  Discontinued        Assessment/Plan:  This is a routine wellness examination for Lindsey [name].  Patient Care Team: Sowles, Krichna, MD as PCP - General (Family Medicine) Odis Helayne MATSU, MD as Consulting Physician (Psychiatry) Lane Arthea BRAVO, MD as Consulting Physician  (Neurology) [facility], [facility])  I have personally reviewed and noted the following in the patients chart:   Medical and social history Use of alcohol, tobacco or illicit drugs  Current medications and supplements including opioid prescriptions. Functional ability and status Nutritional status Physical activity Advanced directives List of other physicians Hospitalizations, surgeries, and ER visits in previous 12 months Vitals Screenings to include cognitive, depression, and falls Referrals and appointments  No orders of the defined types were placed in this encounter.  In addition, I have reviewed and discussed with patient certain preventive protocols, quality metrics, and best practice recommendations. A written personalized care plan for preventive services as well as general preventive health recommendations  were provided to patient.   Jhonnie GORMAN Das, LPN   8/0/7973   Return in 1 year (on 10/23/2025).  After Visit Summary: (MyChart) Due to this being a telephonic visit, the after visit summary with patients personalized plan was offered to patient via MyChart   Nurse Notes: UTD ON SHOTS; AGED OUT OF MAMMOGRAM, COLONOSCOPY; UTD ON BDS   "

## 2024-10-25 ENCOUNTER — Emergency Department

## 2024-10-25 ENCOUNTER — Other Ambulatory Visit: Payer: Self-pay

## 2024-10-25 ENCOUNTER — Inpatient Hospital Stay
Admission: EM | Admit: 2024-10-25 | Discharge: 2024-11-03 | DRG: 071 | Disposition: A | Discharge status: Skilled Nursing Facility | Attending: Internal Medicine | Admitting: Internal Medicine

## 2024-10-25 DIAGNOSIS — D631 Anemia in chronic kidney disease: Secondary | ICD-10-CM | POA: Diagnosis present

## 2024-10-25 DIAGNOSIS — I10 Essential (primary) hypertension: Secondary | ICD-10-CM | POA: Diagnosis present

## 2024-10-25 DIAGNOSIS — Z85828 Personal history of other malignant neoplasm of skin: Secondary | ICD-10-CM

## 2024-10-25 DIAGNOSIS — N281 Cyst of kidney, acquired: Secondary | ICD-10-CM | POA: Diagnosis present

## 2024-10-25 DIAGNOSIS — G9340 Encephalopathy, unspecified: Secondary | ICD-10-CM | POA: Diagnosis present

## 2024-10-25 DIAGNOSIS — K863 Pseudocyst of pancreas: Secondary | ICD-10-CM | POA: Diagnosis present

## 2024-10-25 DIAGNOSIS — Z79899 Other long term (current) drug therapy: Secondary | ICD-10-CM

## 2024-10-25 DIAGNOSIS — G9349 Other encephalopathy: Principal | ICD-10-CM | POA: Diagnosis present

## 2024-10-25 DIAGNOSIS — E860 Dehydration: Secondary | ICD-10-CM | POA: Diagnosis present

## 2024-10-25 DIAGNOSIS — F330 Major depressive disorder, recurrent, mild: Secondary | ICD-10-CM | POA: Diagnosis present

## 2024-10-25 DIAGNOSIS — R531 Weakness: Principal | ICD-10-CM

## 2024-10-25 DIAGNOSIS — F0391 Unspecified dementia with behavioral disturbance: Secondary | ICD-10-CM

## 2024-10-25 DIAGNOSIS — E119 Type 2 diabetes mellitus without complications: Secondary | ICD-10-CM | POA: Diagnosis present

## 2024-10-25 DIAGNOSIS — I272 Pulmonary hypertension, unspecified: Secondary | ICD-10-CM | POA: Diagnosis present

## 2024-10-25 DIAGNOSIS — E1169 Type 2 diabetes mellitus with other specified complication: Secondary | ICD-10-CM

## 2024-10-25 DIAGNOSIS — Z7984 Long term (current) use of oral hypoglycemic drugs: Secondary | ICD-10-CM

## 2024-10-25 DIAGNOSIS — N1831 Chronic kidney disease, stage 3a: Secondary | ICD-10-CM

## 2024-10-25 DIAGNOSIS — K5909 Other constipation: Secondary | ICD-10-CM

## 2024-10-25 DIAGNOSIS — E1122 Type 2 diabetes mellitus with diabetic chronic kidney disease: Secondary | ICD-10-CM

## 2024-10-25 DIAGNOSIS — E872 Acidosis, unspecified: Secondary | ICD-10-CM | POA: Diagnosis present

## 2024-10-25 DIAGNOSIS — F0390 Unspecified dementia without behavioral disturbance: Secondary | ICD-10-CM

## 2024-10-25 DIAGNOSIS — Z888 Allergy status to other drugs, medicaments and biological substances status: Secondary | ICD-10-CM

## 2024-10-25 DIAGNOSIS — E785 Hyperlipidemia, unspecified: Secondary | ICD-10-CM | POA: Diagnosis present

## 2024-10-25 DIAGNOSIS — F03918 Unspecified dementia, unspecified severity, with other behavioral disturbance: Secondary | ICD-10-CM | POA: Diagnosis present

## 2024-10-25 DIAGNOSIS — A419 Sepsis, unspecified organism: Secondary | ICD-10-CM

## 2024-10-25 DIAGNOSIS — M797 Fibromyalgia: Secondary | ICD-10-CM | POA: Diagnosis present

## 2024-10-25 DIAGNOSIS — I3139 Other pericardial effusion (noninflammatory): Secondary | ICD-10-CM | POA: Diagnosis present

## 2024-10-25 DIAGNOSIS — Z1152 Encounter for screening for COVID-19: Secondary | ICD-10-CM

## 2024-10-25 DIAGNOSIS — F419 Anxiety disorder, unspecified: Secondary | ICD-10-CM | POA: Insufficient documentation

## 2024-10-25 DIAGNOSIS — F0393 Unspecified dementia, unspecified severity, with mood disturbance: Secondary | ICD-10-CM | POA: Diagnosis present

## 2024-10-25 DIAGNOSIS — E870 Hyperosmolality and hypernatremia: Secondary | ICD-10-CM | POA: Diagnosis present

## 2024-10-25 DIAGNOSIS — Z8249 Family history of ischemic heart disease and other diseases of the circulatory system: Secondary | ICD-10-CM

## 2024-10-25 DIAGNOSIS — Z833 Family history of diabetes mellitus: Secondary | ICD-10-CM

## 2024-10-25 DIAGNOSIS — M109 Gout, unspecified: Secondary | ICD-10-CM | POA: Diagnosis present

## 2024-10-25 LAB — CBC WITH DIFFERENTIAL/PLATELET
Abs Immature Granulocytes: 0.02 K/uL (ref 0.00–0.07)
Basophils Absolute: 0 K/uL (ref 0.0–0.1)
Basophils Relative: 1 %
Eosinophils Absolute: 0.1 K/uL (ref 0.0–0.5)
Eosinophils Relative: 1 %
HCT: 36.7 % (ref 36.0–46.0)
Hemoglobin: 11.4 g/dL — ABNORMAL LOW (ref 12.0–15.0)
Immature Granulocytes: 0 %
Lymphocytes Relative: 26 %
Lymphs Abs: 2 K/uL (ref 0.7–4.0)
MCH: 28.6 pg (ref 26.0–34.0)
MCHC: 31.1 g/dL (ref 30.0–36.0)
MCV: 92.2 fL (ref 80.0–100.0)
Monocytes Absolute: 0.7 K/uL (ref 0.1–1.0)
Monocytes Relative: 9 %
Neutro Abs: 4.9 K/uL (ref 1.7–7.7)
Neutrophils Relative %: 63 %
Platelets: 174 K/uL (ref 150–400)
RBC: 3.98 MIL/uL (ref 3.87–5.11)
RDW: 14.6 % (ref 11.5–15.5)
WBC: 7.6 K/uL (ref 4.0–10.5)
nRBC: 0 % (ref 0.0–0.2)

## 2024-10-25 LAB — URINALYSIS, W/ REFLEX TO CULTURE (INFECTION SUSPECTED)
Bilirubin Urine: NEGATIVE
Glucose, UA: >=500 mg/dL — AB
Hgb urine dipstick: NEGATIVE
Ketones, ur: NEGATIVE mg/dL
Leukocytes,Ua: NEGATIVE
Nitrite: NEGATIVE
Protein, ur: NEGATIVE mg/dL
Specific Gravity, Urine: 1.027 (ref 1.005–1.030)
pH: 5 (ref 5.0–8.0)

## 2024-10-25 LAB — RESP PANEL BY RT-PCR (RSV, FLU A&B, COVID)  RVPGX2
Influenza A by PCR: NEGATIVE
Influenza B by PCR: NEGATIVE
Resp Syncytial Virus by PCR: NEGATIVE
SARS Coronavirus 2 by RT PCR: NEGATIVE

## 2024-10-25 LAB — COMPREHENSIVE METABOLIC PANEL WITH GFR
ALT: 16 U/L (ref 0–44)
AST: 22 U/L (ref 15–41)
Albumin: 4.4 g/dL (ref 3.5–5.0)
Alkaline Phosphatase: 97 U/L (ref 38–126)
Anion gap: 15 (ref 5–15)
BUN: 26 mg/dL — ABNORMAL HIGH (ref 8–23)
CO2: 25 mmol/L (ref 22–32)
Calcium: 10.9 mg/dL — ABNORMAL HIGH (ref 8.9–10.3)
Chloride: 107 mmol/L (ref 98–111)
Creatinine, Ser: 1.07 mg/dL — ABNORMAL HIGH (ref 0.44–1.00)
GFR, Estimated: 54 mL/min — ABNORMAL LOW
Glucose, Bld: 189 mg/dL — ABNORMAL HIGH (ref 70–99)
Potassium: 4 mmol/L (ref 3.5–5.1)
Sodium: 147 mmol/L — ABNORMAL HIGH (ref 135–145)
Total Bilirubin: 0.3 mg/dL (ref 0.0–1.2)
Total Protein: 7.4 g/dL (ref 6.5–8.1)

## 2024-10-25 LAB — PROTIME-INR
INR: 1 (ref 0.8–1.2)
Prothrombin Time: 13.4 s (ref 11.4–15.2)

## 2024-10-25 LAB — CBG MONITORING, ED: Glucose-Capillary: 181 mg/dL — ABNORMAL HIGH (ref 70–99)

## 2024-10-25 LAB — LACTIC ACID, PLASMA: Lactic Acid, Venous: 2.6 mmol/L (ref 0.5–1.9)

## 2024-10-25 MED ORDER — SODIUM CHLORIDE 0.9 % IV BOLUS: 1000.0000 mL | Freq: Once | INTRAVENOUS | Status: DC

## 2024-10-25 MED ORDER — SODIUM CHLORIDE 0.9 % IV BOLUS
1000.0000 mL | Freq: Once | INTRAVENOUS | Status: AC
  Administered 2024-10-25: 1000 mL via INTRAVENOUS

## 2024-10-25 NOTE — ED Triage Notes (Signed)
 Pt presents to ED from home accompanied by daughter/legal guardian for increasing altered mental status, lethargy, frequently falling asleep, generalized weakness, gradually worsening over the last week.

## 2024-10-25 NOTE — ED Provider Notes (Incomplete)
 "  [facility] Provider Note    Event Date/Time   First MD Initiated Contact with Patient 10/25/24 1812     (approximate)   History   Chief Complaint: Altered Mental Status   HPI  Lindsey [name] is a 74 y.o. female with a history of diabetes, hypertension, CKD, dementia who is brought to the ED by her caregiver due to increased confusion and decreased energy level for the past 10 days.  She reports that usually when this happens the patient has an infection.  No fever.  Patient has had poor oral intake.  No vomiting or diarrhea, no specific pain complaint.        Past Medical History:  Diagnosis Date   Acute gout    Allergy ace inhibitors   Anemia of chronic disease    Anxiety    Basal cell carcinoma    Cataract    Depression, major, recurrent, mild    Diabetes mellitus without complication (HCC)    Fibromyalgia syndrome    Hyperlipidemia    Hypertension    Osteoporosis    Retinopathy    Stage 3a chronic kidney disease (HCC) 05/08/2022    Current Outpatient Rx   Order #: [id number] Class: Normal   Order #: [id number] Class: Normal   Order #: [id number] Class: No Print   Order #: [id number] Class: No Print   Order #: [id number] Class: Normal   Order #: [id number] Class: Normal   Order #: [id number] Class: Historical Med   Order #: [id number] Class: Normal   Order #: [id number] Class: Historical Med   Order #: [id number] Class: Normal   Order #: [id number] Class: Normal   Order #: [id number] Class: Print   Order #: [id number] Class: Normal   Order #: [id number] Class: Historical Med   Order #: [id number] Class: Normal    Past Surgical History:  Procedure Laterality Date   CESAREAN SECTION     TONSILECTOMY, ADENOIDECTOMY, BILATERAL MYRINGOTOMY AND TUBES     TUBAL LIGATION      Physical Exam   Triage Vital Signs: ED Triage Vitals  Encounter Vitals Group     BP 10/25/24 1706 (!) 141/92     Girls Systolic BP Percentile --      Girls Diastolic BP Percentile --       Boys Systolic BP Percentile --      Boys Diastolic BP Percentile --      Pulse Rate 10/25/24 1706 (!) 119     Resp 10/25/24 1706 (!) 24     Temp 10/25/24 1706 97.7 F (36.5 C)     Temp Source 10/25/24 1706 Oral     SpO2 10/25/24 1706 95 %     Weight 10/25/24 1702 140 lb (63.5 kg)     Height 10/25/24 1702 4' 11 (1.499 m)     Head Circumference --      Peak Flow --      Pain Score --      Pain Loc --      Pain Education --      Exclude from Growth Chart --     Most recent vital signs: Vitals:   10/25/24 2354 10/25/24 2359  BP: (!) 120/100 (!) 166/114  Pulse:    Resp:    Temp:    SpO2:      General: Awake, no distress.  CV:  Good peripheral perfusion.  Tachycardia heart rate 105 Resp:  Normal effort.  Clear lungs Abd:  No distention.  Soft nontender Other:  Dry oral mucosa.  No meningismus   ED  Results / Procedures / Treatments   Labs (all labs ordered are listed, but only abnormal results are displayed) Labs Reviewed  COMPREHENSIVE METABOLIC PANEL WITH GFR - Abnormal; Notable for the following components:      Result Value   Sodium 147 (*)    Glucose, Bld 189 (*)    BUN 26 (*)    Creatinine, Ser 1.07 (*)    Calcium  10.9 (*)    GFR, Estimated 54 (*)    All other components within normal limits  LACTIC ACID, PLASMA - Abnormal; Notable for the following components:   Lactic Acid, Venous 2.6 (*)    All other components within normal limits  URINALYSIS, W/ REFLEX TO CULTURE (INFECTION SUSPECTED) - Abnormal; Notable for the following components:   Color, Urine STRAW (*)    APPearance CLEAR (*)    Glucose, UA >=500 (*)    Bacteria, UA RARE (*)    All other components within normal limits  CBC WITH DIFFERENTIAL/PLATELET - Abnormal; Notable for the following components:   Hemoglobin 11.4 (*)    All other components within normal limits  CBG MONITORING, ED - Abnormal; Notable for the following components:   Glucose-Capillary 181 (*)    All other components  within normal limits  RESP PANEL BY RT-PCR (RSV, FLU A&B, COVID)  [id number]  CULTURE, BLOOD (ROUTINE X 2)  CULTURE, BLOOD (ROUTINE X 2)  PROTIME-INR  LACTIC ACID, PLASMA  CBC WITH DIFFERENTIAL/PLATELET     EKG    RADIOLOGY Chest x-ray interpreted by me, unremarkable.  Radiology report reviewed   PROCEDURES:  Procedures   MEDICATIONS ORDERED IN ED: Medications  sodium chloride  0.9 % bolus 1,000 mL (0 mLs Intravenous Stopped 10/25/24 2053)     IMPRESSION / MDM / ASSESSMENT AND PLAN / ED COURSE  I reviewed the triage vital signs and the nursing notes.  DDx: Dehydration, AKI, electrolyte derangement, anemia, UTI, pneumonia, COVID, influenza  Patient's presentation is most consistent with acute presentation with potential threat to life or bodily function.  Patient presents with malaise and fatigue, decreased oral intake.  Initial vitals show tachycardia, tachypnea, concern for sepsis.  Lab workup is reassuring except for mildly elevated lactic acid.  Patient given fluid bolus.  UA, RPP, chest x-ray unremarkable.  Doubt meningitis or encephalitis.  Will check orthostatics, ambulation test.  If she persistently demonstrates a functional decline, may require hospital observation pending culture results.   ----------------------------------------- 12:11 AM on 10/26/2024 ----------------------------------------- Orthostatics show significant decline in blood pressure with standing.  Patient unable to support her weight at all, requiring full assistance.  Will obtain EKG, CT head, chest abdomen pelvis to evaluate for ICH, PE, occult intra-abdominal process, and plan to admit for acute decline, suspected sepsis with unknown source, start antibiotic coverage.      FINAL CLINICAL IMPRESSION(S) / ED DIAGNOSES   Final diagnoses:  Generalized weakness  Chronic dementia (HCC)  Type 2 diabetes mellitus without complication, without long-term current use of insulin  (HCC)     Rx /  DC Orders   ED Discharge Orders     None        Note:  This document was prepared using Dragon voice recognition software and may include unintentional dictation errors.   Viviann Pastor, MD 10/26/24 JEROME    Viviann Pastor, MD 10/26/24 0013  "

## 2024-10-26 ENCOUNTER — Emergency Department

## 2024-10-26 ENCOUNTER — Other Ambulatory Visit: Payer: Self-pay

## 2024-10-26 ENCOUNTER — Inpatient Hospital Stay: Admit: 2024-10-26 | Discharge: 2024-10-26 | Disposition: A

## 2024-10-26 DIAGNOSIS — F03918 Unspecified dementia, unspecified severity, with other behavioral disturbance: Secondary | ICD-10-CM | POA: Diagnosis not present

## 2024-10-26 DIAGNOSIS — Z8249 Family history of ischemic heart disease and other diseases of the circulatory system: Secondary | ICD-10-CM | POA: Diagnosis not present

## 2024-10-26 DIAGNOSIS — G9349 Other encephalopathy: Secondary | ICD-10-CM | POA: Diagnosis present

## 2024-10-26 DIAGNOSIS — F0393 Unspecified dementia, unspecified severity, with mood disturbance: Secondary | ICD-10-CM | POA: Diagnosis present

## 2024-10-26 DIAGNOSIS — M109 Gout, unspecified: Secondary | ICD-10-CM | POA: Diagnosis present

## 2024-10-26 DIAGNOSIS — G9340 Encephalopathy, unspecified: Secondary | ICD-10-CM | POA: Diagnosis not present

## 2024-10-26 DIAGNOSIS — E872 Acidosis, unspecified: Secondary | ICD-10-CM | POA: Diagnosis present

## 2024-10-26 DIAGNOSIS — A419 Sepsis, unspecified organism: Secondary | ICD-10-CM

## 2024-10-26 DIAGNOSIS — E119 Type 2 diabetes mellitus without complications: Secondary | ICD-10-CM

## 2024-10-26 DIAGNOSIS — Z79899 Other long term (current) drug therapy: Secondary | ICD-10-CM | POA: Diagnosis not present

## 2024-10-26 DIAGNOSIS — N281 Cyst of kidney, acquired: Secondary | ICD-10-CM | POA: Diagnosis present

## 2024-10-26 DIAGNOSIS — E870 Hyperosmolality and hypernatremia: Secondary | ICD-10-CM | POA: Diagnosis present

## 2024-10-26 DIAGNOSIS — F419 Anxiety disorder, unspecified: Secondary | ICD-10-CM | POA: Diagnosis not present

## 2024-10-26 DIAGNOSIS — I272 Pulmonary hypertension, unspecified: Secondary | ICD-10-CM | POA: Diagnosis present

## 2024-10-26 DIAGNOSIS — Z1152 Encounter for screening for COVID-19: Secondary | ICD-10-CM | POA: Diagnosis not present

## 2024-10-26 DIAGNOSIS — I3139 Other pericardial effusion (noninflammatory): Secondary | ICD-10-CM | POA: Diagnosis present

## 2024-10-26 DIAGNOSIS — Z888 Allergy status to other drugs, medicaments and biological substances status: Secondary | ICD-10-CM | POA: Diagnosis not present

## 2024-10-26 DIAGNOSIS — Z85828 Personal history of other malignant neoplasm of skin: Secondary | ICD-10-CM | POA: Diagnosis not present

## 2024-10-26 DIAGNOSIS — Z7984 Long term (current) use of oral hypoglycemic drugs: Secondary | ICD-10-CM | POA: Diagnosis not present

## 2024-10-26 DIAGNOSIS — F32A Depression, unspecified: Secondary | ICD-10-CM

## 2024-10-26 DIAGNOSIS — E860 Dehydration: Secondary | ICD-10-CM | POA: Diagnosis present

## 2024-10-26 DIAGNOSIS — D631 Anemia in chronic kidney disease: Secondary | ICD-10-CM | POA: Diagnosis present

## 2024-10-26 DIAGNOSIS — I10 Essential (primary) hypertension: Secondary | ICD-10-CM | POA: Diagnosis present

## 2024-10-26 DIAGNOSIS — E785 Hyperlipidemia, unspecified: Secondary | ICD-10-CM | POA: Diagnosis present

## 2024-10-26 DIAGNOSIS — M797 Fibromyalgia: Secondary | ICD-10-CM | POA: Diagnosis present

## 2024-10-26 DIAGNOSIS — F330 Major depressive disorder, recurrent, mild: Secondary | ICD-10-CM | POA: Diagnosis present

## 2024-10-26 DIAGNOSIS — K863 Pseudocyst of pancreas: Secondary | ICD-10-CM | POA: Diagnosis present

## 2024-10-26 DIAGNOSIS — Z833 Family history of diabetes mellitus: Secondary | ICD-10-CM | POA: Diagnosis not present

## 2024-10-26 LAB — BASIC METABOLIC PANEL WITH GFR
Anion gap: 12 (ref 5–15)
Anion gap: 11 (ref 5–15)
BUN: 19 mg/dL (ref 8–23)
BUN: 16 mg/dL (ref 8–23)
CO2: 24 mmol/L (ref 22–32)
CO2: 25 mmol/L (ref 22–32)
Calcium: 10.5 mg/dL — ABNORMAL HIGH (ref 8.9–10.3)
Calcium: 10.1 mg/dL (ref 8.9–10.3)
Chloride: 113 mmol/L — ABNORMAL HIGH (ref 98–111)
Chloride: 105 mmol/L (ref 98–111)
Creatinine, Ser: 0.82 mg/dL (ref 0.44–1.00)
Creatinine, Ser: 0.87 mg/dL (ref 0.44–1.00)
GFR, Estimated: >60 mL/min
GFR, Estimated: >60 mL/min
Glucose, Bld: 144 mg/dL — ABNORMAL HIGH (ref 70–99)
Glucose, Bld: 176 mg/dL — ABNORMAL HIGH (ref 70–99)
Potassium: 3.7 mmol/L (ref 3.5–5.1)
Potassium: 3.6 mmol/L (ref 3.5–5.1)
Sodium: 149 mmol/L — ABNORMAL HIGH (ref 135–145)
Sodium: 140 mmol/L (ref 135–145)

## 2024-10-26 LAB — PROTIME-INR
INR: 1 (ref 0.8–1.2)
Prothrombin Time: 13.8 s (ref 11.4–15.2)

## 2024-10-26 LAB — CBG MONITORING, ED
Glucose-Capillary: 124 mg/dL — ABNORMAL HIGH (ref 70–99)
Glucose-Capillary: 120 mg/dL — ABNORMAL HIGH (ref 70–99)
Glucose-Capillary: 127 mg/dL — ABNORMAL HIGH (ref 70–99)

## 2024-10-26 LAB — CBC
HCT: 32.2 % — ABNORMAL LOW (ref 36.0–46.0)
Hemoglobin: 10.2 g/dL — ABNORMAL LOW (ref 12.0–15.0)
MCH: 28.8 pg (ref 26.0–34.0)
MCHC: 31.7 g/dL (ref 30.0–36.0)
MCV: 91 fL (ref 80.0–100.0)
Platelets: 157 K/uL (ref 150–400)
RBC: 3.54 MIL/uL — ABNORMAL LOW (ref 3.87–5.11)
RDW: 14.6 % (ref 11.5–15.5)
WBC: 6.8 K/uL (ref 4.0–10.5)
nRBC: 0 % (ref 0.0–0.2)

## 2024-10-26 LAB — GLUCOSE, CAPILLARY
Glucose-Capillary: 162 mg/dL — ABNORMAL HIGH (ref 70–99)
Glucose-Capillary: 144 mg/dL — ABNORMAL HIGH (ref 70–99)

## 2024-10-26 LAB — CORTISOL-AM, BLOOD: Cortisol - AM: 16.5 ug/dL (ref 6.7–22.6)

## 2024-10-26 LAB — ALBUMIN: Albumin: 4 g/dL (ref 3.5–5.0)

## 2024-10-26 LAB — PROCALCITONIN: Procalcitonin: <0.1 ng/mL

## 2024-10-26 LAB — LACTIC ACID, PLASMA: Lactic Acid, Venous: 0.8 mmol/L (ref 0.5–1.9)

## 2024-10-26 MED ORDER — ROSUVASTATIN CALCIUM 10 MG PO TABS
20.0000 mg | ORAL_TABLET | Freq: Every day | ORAL | Status: DC
  Administered 2024-10-26 – 2024-11-03 (×9): 20 mg via ORAL
  Filled 2024-10-26 (×5): qty 2
  Filled 2024-10-26: qty 1
  Filled 2024-10-26 (×3): qty 2

## 2024-10-26 MED ORDER — INSULIN ASPART 100 UNIT/ML IJ SOLN: 0.0000 [IU] | Freq: Every day | INTRAMUSCULAR | Status: DC

## 2024-10-26 MED ORDER — ADULT MULTIVITAMIN W/MINERALS CH
1.0000 | ORAL_TABLET | Freq: Every day | ORAL | Status: DC
  Administered 2024-10-26 – 2024-11-03 (×9): 1 via ORAL
  Filled 2024-10-26 (×9): qty 1

## 2024-10-26 MED ORDER — CARVEDILOL 6.25 MG PO TABS
6.2500 mg | ORAL_TABLET | Freq: Two times a day (BID) | ORAL | Status: DC
  Administered 2024-10-26 – 2024-11-03 (×17): 6.25 mg via ORAL
  Filled 2024-10-26 (×17): qty 1

## 2024-10-26 MED ORDER — SODIUM CHLORIDE 0.9 % IV SOLN: 2.0000 g | INTRAVENOUS | Status: DC

## 2024-10-26 MED ORDER — ACETAMINOPHEN 650 MG RE SUPP: 650.0000 mg | Freq: Four times a day (QID) | RECTAL | Status: DC | PRN

## 2024-10-26 MED ORDER — INSULIN ASPART 100 UNIT/ML IJ SOLN
0.0000 [IU] | Freq: Three times a day (TID) | INTRAMUSCULAR | Status: DC
  Administered 2024-10-26 – 2024-10-28 (×2): 2 [IU] via SUBCUTANEOUS
  Administered 2024-10-29 (×2): 3 [IU] via SUBCUTANEOUS
  Administered 2024-10-29: 1 [IU] via SUBCUTANEOUS
  Administered 2024-10-30: 2 [IU] via SUBCUTANEOUS
  Administered 2024-10-30: 1 [IU] via SUBCUTANEOUS
  Administered 2024-10-31 (×2): 3 [IU] via SUBCUTANEOUS
  Administered 2024-11-01: 1 [IU] via SUBCUTANEOUS
  Administered 2024-11-01: 2 [IU] via SUBCUTANEOUS
  Administered 2024-11-01 – 2024-11-02 (×4): 1 [IU] via SUBCUTANEOUS
  Administered 2024-11-03: 7 [IU] via SUBCUTANEOUS
  Filled 2024-10-26: qty 2
  Filled 2024-10-26 (×2): qty 1
  Filled 2024-10-26: qty 2
  Filled 2024-10-26: qty 1
  Filled 2024-10-26: qty 3
  Filled 2024-10-26: qty 1
  Filled 2024-10-26: qty 2
  Filled 2024-10-26: qty 3
  Filled 2024-10-26: qty 2
  Filled 2024-10-26: qty 3
  Filled 2024-10-26: qty 1
  Filled 2024-10-26: qty 7
  Filled 2024-10-26: qty 1

## 2024-10-26 MED ORDER — MAGNESIUM HYDROXIDE 400 MG/5ML PO SUSP: 30.0000 mL | Freq: Every day | ORAL | Status: DC | PRN

## 2024-10-26 MED ORDER — METRONIDAZOLE 500 MG/100ML IV SOLN
500.0000 mg | Freq: Once | INTRAVENOUS | Status: AC
  Administered 2024-10-26: 500 mg via INTRAVENOUS
  Filled 2024-10-26: qty 100

## 2024-10-26 MED ORDER — EMPAGLIFLOZIN 25 MG PO TABS
25.0000 mg | ORAL_TABLET | Freq: Every day | ORAL | Status: DC
  Administered 2024-10-26 – 2024-11-03 (×9): 25 mg via ORAL
  Filled 2024-10-26 (×9): qty 1

## 2024-10-26 MED ORDER — INSULIN ASPART 100 UNIT/ML IJ SOLN
0.0000 [IU] | Freq: Every day | INTRAMUSCULAR | Status: DC
  Administered 2024-11-01: 2 [IU] via SUBCUTANEOUS
  Filled 2024-10-26: qty 2

## 2024-10-26 MED ORDER — INSULIN ASPART 100 UNIT/ML IJ SOLN
0.0000 [IU] | Freq: Three times a day (TID) | INTRAMUSCULAR | Status: DC
  Administered 2024-10-26: 2 [IU] via SUBCUTANEOUS
  Filled 2024-10-26: qty 2

## 2024-10-26 MED ORDER — POLYETHYLENE GLYCOL 3350 17 G PO PACK: 17.0000 g | PACK | Freq: Every day | ORAL | Status: DC | PRN

## 2024-10-26 MED ORDER — IOHEXOL 350 MG/ML SOLN
100.0000 mL | Freq: Once | INTRAVENOUS | Status: AC | PRN
  Administered 2024-10-26: 100 mL via INTRAVENOUS

## 2024-10-26 MED ORDER — METRONIDAZOLE 500 MG/100ML IV SOLN
500.0000 mg | Freq: Two times a day (BID) | INTRAVENOUS | Status: DC
  Administered 2024-10-26: 500 mg via INTRAVENOUS
  Filled 2024-10-26: qty 100

## 2024-10-26 MED ORDER — MEMANTINE HCL 5 MG PO TABS
10.0000 mg | ORAL_TABLET | Freq: Two times a day (BID) | ORAL | Status: DC
  Administered 2024-10-26 – 2024-11-03 (×17): 10 mg via ORAL
  Filled 2024-10-26 (×17): qty 2

## 2024-10-26 MED ORDER — TRAZODONE HCL 50 MG PO TABS: 25.0000 mg | ORAL_TABLET | Freq: Every evening | ORAL | Status: DC | PRN

## 2024-10-26 MED ORDER — QUETIAPINE FUMARATE 25 MG PO TABS: 100.0000 mg | ORAL_TABLET | Freq: Every day | ORAL | Status: DC

## 2024-10-26 MED ORDER — IPRATROPIUM-ALBUTEROL 0.5-2.5 (3) MG/3ML IN SOLN
3.0000 mL | Freq: Four times a day (QID) | RESPIRATORY_TRACT | Status: DC
  Administered 2024-10-26 (×2): 3 mL via RESPIRATORY_TRACT
  Filled 2024-10-26 (×2): qty 3

## 2024-10-26 MED ORDER — SERTRALINE HCL 50 MG PO TABS: 50.0000 mg | ORAL_TABLET | Freq: Every evening | ORAL | Status: DC

## 2024-10-26 MED ORDER — IPRATROPIUM-ALBUTEROL 0.5-2.5 (3) MG/3ML IN SOLN: 3.0000 mL | RESPIRATORY_TRACT | Status: DC | PRN

## 2024-10-26 MED ORDER — ALLOPURINOL 100 MG PO TABS
100.0000 mg | ORAL_TABLET | Freq: Two times a day (BID) | ORAL | Status: DC
  Administered 2024-10-26 – 2024-11-03 (×17): 100 mg via ORAL
  Filled 2024-10-26 (×18): qty 1

## 2024-10-26 MED ORDER — SODIUM CHLORIDE 0.9 % IV SOLN
2.0000 g | Freq: Once | INTRAVENOUS | Status: AC
  Administered 2024-10-26: 2 g via INTRAVENOUS
  Filled 2024-10-26: qty 20

## 2024-10-26 MED ORDER — GUAIFENESIN ER 600 MG PO TB12
600.0000 mg | ORAL_TABLET | Freq: Two times a day (BID) | ORAL | Status: DC
  Administered 2024-10-26 – 2024-11-03 (×17): 600 mg via ORAL
  Filled 2024-10-26 (×17): qty 1

## 2024-10-26 MED ORDER — POLYETHYLENE GLYCOL 3350 17 G PO PACK
17.0000 g | PACK | Freq: Every day | ORAL | Status: DC
  Administered 2024-10-26: 17 g via ORAL
  Filled 2024-10-26: qty 1

## 2024-10-26 MED ORDER — POLYETHYLENE GLYCOL 3350 17 GM/SCOOP PO POWD: 17.0000 g | Freq: Every day | ORAL | Status: DC

## 2024-10-26 MED ORDER — PIOGLITAZONE HCL 15 MG PO TABS
15.0000 mg | ORAL_TABLET | Freq: Every day | ORAL | Status: DC
  Administered 2024-10-26 – 2024-11-03 (×9): 15 mg via ORAL
  Filled 2024-10-26 (×9): qty 1

## 2024-10-26 MED ORDER — PIPERACILLIN-TAZOBACTAM 3.375 G IVPB
3.3750 g | Freq: Three times a day (TID) | INTRAVENOUS | Status: DC
  Administered 2024-10-26 – 2024-10-27 (×3): 3.375 g via INTRAVENOUS
  Filled 2024-10-26 (×3): qty 50

## 2024-10-26 MED ORDER — LOSARTAN POTASSIUM 25 MG PO TABS
25.0000 mg | ORAL_TABLET | Freq: Every day | ORAL | Status: DC
  Administered 2024-10-26 – 2024-11-03 (×9): 25 mg via ORAL
  Filled 2024-10-26 (×9): qty 1

## 2024-10-26 MED ORDER — VITAMIN D 25 MCG (1000 UNIT) PO TABS
1000.0000 [IU] | ORAL_TABLET | Freq: Every day | ORAL | Status: DC
  Administered 2024-10-26 – 2024-11-03 (×9): 1000 [IU] via ORAL
  Filled 2024-10-26 (×9): qty 1

## 2024-10-26 MED ORDER — SODIUM CHLORIDE 0.45 % IV SOLN: INTRAVENOUS | Status: DC

## 2024-10-26 MED ORDER — ENOXAPARIN SODIUM 40 MG/0.4ML IJ SOSY
40.0000 mg | PREFILLED_SYRINGE | INTRAMUSCULAR | Status: DC
  Administered 2024-10-26 – 2024-11-03 (×9): 40 mg via SUBCUTANEOUS
  Filled 2024-10-26 (×9): qty 0.4

## 2024-10-26 MED ORDER — LACTATED RINGERS IV SOLN
150.0000 mL/h | INTRAVENOUS | Status: DC
  Administered 2024-10-26: 150 mL/h via INTRAVENOUS

## 2024-10-26 MED ORDER — ACETAMINOPHEN 325 MG PO TABS: 650.0000 mg | ORAL_TABLET | Freq: Four times a day (QID) | ORAL | Status: DC | PRN

## 2024-10-26 MED ORDER — DONEPEZIL HCL 5 MG PO TABS
10.0000 mg | ORAL_TABLET | Freq: Every day | ORAL | Status: DC
  Administered 2024-10-26 – 2024-11-02 (×8): 10 mg via ORAL
  Filled 2024-10-26 (×8): qty 2

## 2024-10-26 NOTE — Assessment & Plan Note (Signed)
-   Will continue Aricept , Namenda  and Seroquel .

## 2024-10-26 NOTE — Evaluation (Signed)
 Occupational Therapy Evaluation Patient Details Name: Lindsey [name] MRN: [id number] DOB: 08-15-1951 Today's Date: 10/26/2024   History of Present Illness   74 y.o. Caucasian female with medical history significant for gout, anxiety, depression, dementia, type 2 diabetes mellitus, fibromyalgia, dyslipidemia, hypertension, and stage IIIa chronic kidney disease, who presented to the emergency room with onset of altered mental status with increased confusion and generalized weakness with diminished energy and activity level over the last 10 days.     Clinical Impressions Patient presenting with decreased Ind in self care, balance, functional mobility, transfers, endurance, and safety awareness. Patient is very confused and tangential with speech needing frequent redirection to tasks. She grasps and holds onto bed rails and is resistive of mobility secondary to fearfulness of falling. Pt mod A for bed mobility and total A to change brief and for hygiene. Pt seated in transport chair while therapist changed bed linens and pt needing max A to get back onto bed. Pt's family member present in room to confirm baseline. They would like pt to return home secondary to cognition and thoughts that she would do better in familiar environment.  Patient will benefit from acute OT to increase overall independence in the areas of ADLs, functional mobility, and safety awareness in order to safely discharge.     If plan is discharge home, recommend the following:   A little help with walking and/or transfers;A little help with bathing/dressing/bathroom;Assistance with cooking/housework;Direct supervision/assist for medications management;Direct supervision/assist for financial management;Assist for transportation;Help with stairs or ramp for entrance;Supervision due to cognitive status     Functional Status Assessment   Patient has had a recent decline in their functional status and demonstrates the ability to  make significant improvements in function in a reasonable and predictable amount of time.     Equipment Recommendations   None recommended by OT      Precautions/Restrictions   Precautions Precautions: Fall     Mobility Bed Mobility Overal bed mobility: Needs Assistance Bed Mobility: Sit to Supine, Supine to Sit     Supine to sit: Max assist Sit to supine: Max assist   General bed mobility comments: Pt is very resistive and unable to follow commands    Transfers Overall transfer level: Needs assistance Equipment used: 1 person hand held assist Transfers: Sit to/from Stand, Bed to chair/wheelchair/BSC Sit to Stand: Mod assist     Step pivot transfers: Mod assist            Balance Overall balance assessment: Needs assistance Sitting-balance support: Feet supported Sitting balance-Leahy Scale: Fair     Standing balance support: Single extremity supported Standing balance-Leahy Scale: Poor                             ADL either performed or assessed with clinical judgement   ADL Overall ADL's : Needs assistance/impaired                                       General ADL Comments: Pt having been incontinent of bladder and needing total A for hygiene and to doff soiled brief and don clean one.     Vision Patient Visual Report: No change from baseline              Pertinent Vitals/Pain Pain Assessment Pain Assessment: PAINAD Breathing: normal Negative Vocalization: none Facial Expression: smiling  or inexpressive Body Language: relaxed Consolability: no need to console PAINAD Score: 0     Extremity/Trunk Assessment Upper Extremity Assessment Upper Extremity Assessment: Generalized weakness   Lower Extremity Assessment Lower Extremity Assessment: Generalized weakness       Communication Communication Communication: Impaired Factors Affecting Communication: Hearing impaired   Cognition Arousal:  Alert Behavior During Therapy: Restless, Anxious Cognition: History of cognitive impairments             OT - Cognition Comments: Pt is very confused with tangential speech. Family present in room reporting pt has baseline dementia but is more confused than normal.                 Following commands: Impaired Following commands impaired: Follows one step commands with increased time, Follows one step commands inconsistently     Cueing  General Comments   Cueing Techniques: Verbal cues;Gestural cues;Tactile cues;Visual cues              Home Living Family/patient expects to be discharged to:: Private residence Living Arrangements: Children Available Help at Discharge: Personal care attendant;Family Type of Home: House Home Access: Stairs to enter   Entrance Stairs-Rails: Right;Left Home Layout: One level     Bathroom Shower/Tub: Walk-in shower         Home Equipment: Agricultural Consultant (2 wheels);Rollator (4 wheels)          Prior Functioning/Environment Prior Level of Function : Needs assist             Mobility Comments: Pt ambulates without use of AD but someone is always with her ADLs Comments: Pt has aides 9am-2pm, Family comes 2pm- 6pm, and then daughter arrives at 6pm to stay the rest of the night.    OT Problem List: Decreased strength;Decreased activity tolerance;Impaired balance (sitting and/or standing);Decreased safety awareness   OT Treatment/Interventions: Self-care/ADL training;Balance training;Therapeutic exercise;Therapeutic activities;Energy conservation;DME and/or AE instruction;Patient/family education      OT Goals(Current goals can be found in the care plan section)   Acute Rehab OT Goals Patient Stated Goal: to go home OT Goal Formulation: With family Time For Goal Achievement: 11/09/24 Potential to Achieve Goals: Fair ADL Goals Pt Will Perform Grooming: with modified independence;standing Pt Will Perform Lower Body  Dressing: with min assist;sit to/from stand Pt Will Transfer to Toilet: with min assist;bedside commode;ambulating Pt Will Perform Toileting - Clothing Manipulation and hygiene: with min assist;sit to/from stand   OT Frequency:  Min 2X/week       AM-PAC OT 6 Clicks Daily Activity     Outcome Measure Help from another person eating meals?: None Help from another person taking care of personal grooming?: A Little Help from another person toileting, which includes using toliet, bedpan, or urinal?: A Lot Help from another person bathing (including washing, rinsing, drying)?: A Lot Help from another person to put on and taking off regular upper body clothing?: A Little Help from another person to put on and taking off regular lower body clothing?: A Lot 6 Click Score: 16   End of Session Nurse Communication: Mobility status  Activity Tolerance: Patient tolerated treatment well Patient left: in bed;with call bell/phone within reach;with bed alarm set  OT Visit Diagnosis: Unsteadiness on feet (R26.81);Repeated falls (R29.6);Muscle weakness (generalized) (M62.81)                Time: 8553-8492 OT Time Calculation (min): 21 min Charges:  OT General Charges $OT Visit: 1 Visit OT Evaluation $OT Eval Moderate Complexity: 1 Mod  OT Treatments $Self Care/Home Management : 8-22 mins Izetta Claude, MS, OTR/L , CBIS ascom [phone]  10/26/2024, 4:01 PM

## 2024-10-26 NOTE — ED Notes (Signed)
 Pt ambulated to the door and back to the bed with assistant as this nurse and Jenna, RN

## 2024-10-26 NOTE — Progress Notes (Addendum)
 " PROGRESS NOTE    Lindsey [name]  FMW:[id number] DOB: 11/15/1950 DOA: 10/25/2024 PCP: Sowles, Krichna, MD  Chief Complaint  Patient presents with   Altered Mental Status    Hospital Course:  Lindsey [name] is a 74 y.o. Caucasian female with medical history significant for gout, anxiety, depression, dementia, type 2 diabetes mellitus, fibromyalgia, dyslipidemia, hypertension, and stage IIIa chronic kidney disease, who presented to the emergency room with onset of altered mental status with increased confusion and generalized weakness with diminished energy and activity level over the last 10 days.  Per daughter patient did not have any focal complaints.  Denies SOB, DOE, chest pain, palpitations, nausea/vomiting, abdominal pain, urinary symptoms.  Limited due to patient's dementia, unclear if she is getting adequate p.o. intake  Patient admitted for acute encephalopathy, possible sepsis vs dehydration, hospital course as below  Subjective: Patient was examined at bedside, new to me today. Daughter Rosaline also present at the bedside Is AO x 1 today, daughter reports patient does have dementia which usually more alert than she is today On broad-spectrum antibiotics for possible infectious etiology, cultures pending Continue IV fluids for dehydration   Objective: Vitals:   10/26/24 0430 10/26/24 0700 10/26/24 0800 10/26/24 0817  BP: (!) 148/121 (!) 164/88 (!) 142/95   Pulse:   (!) 105   Resp: 17 15 20    Temp:    98.9 F (37.2 C)  TempSrc:    Axillary  SpO2:  98% 95%   Weight:      Height:        Intake/Output Summary (Last 24 hours) at 10/26/2024 1023 Last data filed at 10/26/2024 0349 Gross per 24 hour  Intake 100 ml  Output --  Net 100 ml   Filed Weights   10/25/24 1702  Weight: 63.5 kg    Examination: GENERAL:  74 y.o.-year-old patient lying in the bed with no acute distress.  NECK:  Supple, no jugular venous distention LUNGS: clear to auscultation  bilaterally CARDIOVASCULAR: Regular rate and tachycardic rhythm, S1, S2 normal. No murmurs, rubs, or gallops.  ABDOMEN: Soft, nondistended, nontender. Bowel sounds present. No organomegaly or mass.  EXTREMITIES: No pedal edema, cyanosis, or clubbing.  NEUROLOGIC: AO x1, pleasantly confused, no gross focal deficits PSYCHIATRIC: The patient is alert and disoriented to time and place.  Normal affect and good eye contact. SKIN: No obvious rash, lesion, or ulcer  Assessment & Plan:  Acute encephalopathy - unclear etiology Suspect secondary to infectious etiology vs dehydration vs medication, other etiology with underlying dementia CT head negative for acute changes On antibiotics as below for possible infectious etiology IV fluids for dehydration Hold off on further imaging, per discussion with daughter Hold Seroquel , sertraline    Sepsis due to undetermined organism Found to be tachycardic on presentation, ? dyspnea Lactic acidosis resolved, No leukocytosis COVID/flu/RSV negative, UA negative CT with no evidence of acute infection On IV Zosyn  Follow-up blood cultures, RVP, procalcitonin  Hypernatremia Change LR to 0.45% NS Monitor BMP pm  Mild bump in creatinine Mild hypercalcemia Improved s/p IV fluids  Normocytic anemia Monitor Hb  Small pericardial effusion Will get Echo  Dementia with behavioral disturbance Anxiety and depression continue Aricept , Namenda  Hold Seroquel , Sertraline  due to encephalopathy   Controlled type 2 diabetes mellitus without complication, without long-term current use of insulin  Hold Home metformin  Continue Jardiance , Actos , SSI   Essential hypertension Continue Losartan , Coreg    Gout continue allopurinol   Right kidney cystic lesion Progressive 2.0 cm complex cystic lesion in  the interpolar right kidney, suspicious for cystic renal neoplasm  Was found on prior CT dated 04/2024 at [facility] Daughter reports chose not to do further  workup  Incidental findings  mildly progressive pancreatic pseudocysts Stable T12 compression deformity Probable pulmonary hypertension  Generalized weakness AM cortisol normal PT/OT eval   DVT prophylaxis: Lovenox  SQ   Code Status: Full Code Disposition:  TBD  Consultants:  None  Procedures:  None  Antimicrobials:  Anti-infectives (From admission, onward)    Start     Dose/Rate Route Frequency Ordered Stop   10/26/24 2200  cefTRIAXone  (ROCEPHIN ) 2 g in sodium chloride  0.9 % 100 mL IVPB        2 g 200 mL/hr over 30 Minutes Intravenous Every 24 hours 10/26/24 0455 10/30/24 2159   10/26/24 1200  metroNIDAZOLE  (FLAGYL ) IVPB 500 mg        500 mg 100 mL/hr over 60 Minutes Intravenous Every 12 hours 10/26/24 0455     10/26/24 0015  cefTRIAXone  (ROCEPHIN ) 2 g in sodium chloride  0.9 % 100 mL IVPB        2 g 200 mL/hr over 30 Minutes Intravenous  Once 10/26/24 0014 10/26/24 0222   10/26/24 0015  metroNIDAZOLE  (FLAGYL ) IVPB 500 mg        500 mg 100 mL/hr over 60 Minutes Intravenous  Once 10/26/24 0014 10/26/24 0349       Data Reviewed: I have personally reviewed following labs and imaging studies CBC: Recent Labs  Lab 10/25/24 1718 10/26/24 0640  WBC 7.6 6.8  NEUTROABS 4.9  --   HGB 11.4* 10.2*  HCT 36.7 32.2*  MCV 92.2 91.0  PLT 174 157   Basic Metabolic Panel: Recent Labs  Lab 10/25/24 1708 10/26/24 0640  NA 147* 149*  K 4.0 3.7  CL 107 113*  CO2 25 24  GLUCOSE 189* 144*  BUN 26* 19  CREATININE 1.07* 0.82  CALCIUM  10.9* 10.5*   GFR: Estimated Creatinine Clearance: 48 mL/min (by C-G formula based on SCr of 0.82 mg/dL). Liver Function Tests: Recent Labs  Lab 10/25/24 1708  AST 22  ALT 16  ALKPHOS 97  BILITOT 0.3  PROT 7.4  ALBUMIN 4.4   CBG: Recent Labs  Lab 10/25/24 1722 10/26/24 0648 10/26/24 0753  GLUCAP 181* 124* 120*    Recent Results (from the past 240 hours)  Culture, blood (Routine x 2)     Status: None (Preliminary result)    Collection Time: 10/25/24  5:08 PM   Specimen: BLOOD  Result Value Ref Range Status   Specimen Description BLOOD BLOOD RIGHT FOREARM  Final   Special Requests   Final    BOTTLES DRAWN AEROBIC AND ANAEROBIC Blood Culture results may not be optimal due to an inadequate volume of blood received in culture bottles   Culture   Final    NO GROWTH < 12 HOURS Performed at [facility], [address] [place], [place] [address]    Report Status PENDING  Incomplete  Resp panel by RT-PCR (RSV, Flu A&B, Covid) Anterior Nasal Swab     Status: None   Collection Time: 10/25/24  5:10 PM   Specimen: Anterior Nasal Swab  Result Value Ref Range Status   SARS Coronavirus 2 by RT PCR NEGATIVE NEGATIVE Final    Comment: (NOTE) SARS-CoV-2 target nucleic acids are NOT DETECTED.  The SARS-CoV-2 RNA is generally detectable in upper respiratory specimens during the acute phase of infection. The lowest concentration of SARS-CoV-2 viral copies this assay  can detect is 138 copies/mL. A negative result does not preclude SARS-Cov-2 infection and should not be used as the sole basis for treatment or other patient management decisions. A negative result may occur with  improper specimen collection/handling, submission of specimen other than nasopharyngeal swab, presence of viral mutation(s) within the areas targeted by this assay, and inadequate number of viral copies(<138 copies/mL). A negative result must be combined with clinical observations, patient history, and epidemiological information. The expected result is Negative.  Fact Sheet for Patients:  bloggercourse.com  Fact Sheet for Healthcare Providers:  seriousbroker.it  This test is no t yet approved or cleared by the [place]  FDA and  has been authorized for detection and/or diagnosis of SARS-CoV-2 by FDA under an Emergency Use Authorization (EUA). This EUA will remain  in effect  (meaning this test can be used) for the duration of the COVID-19 declaration under Section 564(b)(1) of the Act, 21 U.S.C.section 360bbb-3(b)(1), unless the authorization is terminated  or revoked sooner.       Influenza A by PCR NEGATIVE NEGATIVE Final   Influenza B by PCR NEGATIVE NEGATIVE Final    Comment: (NOTE) The Xpert Xpress SARS-CoV-2/FLU/RSV plus assay is intended as an aid in the diagnosis of influenza from Nasopharyngeal swab specimens and should not be used as a sole basis for treatment. Nasal washings and aspirates are unacceptable for Xpert Xpress SARS-CoV-2/FLU/RSV testing.  Fact Sheet for Patients: bloggercourse.com  Fact Sheet for Healthcare Providers: seriousbroker.it  This test is not yet approved or cleared by the [place]  FDA and has been authorized for detection and/or diagnosis of SARS-CoV-2 by FDA under an Emergency Use Authorization (EUA). This EUA will remain in effect (meaning this test can be used) for the duration of the COVID-19 declaration under Section 564(b)(1) of the Act, 21 U.S.C. section 360bbb-3(b)(1), unless the authorization is terminated or revoked.     Resp Syncytial Virus by PCR NEGATIVE NEGATIVE Final    Comment: (NOTE) Fact Sheet for Patients: bloggercourse.com  Fact Sheet for Healthcare Providers: seriousbroker.it  This test is not yet approved or cleared by the [place]  FDA and has been authorized for detection and/or diagnosis of SARS-CoV-2 by FDA under an Emergency Use Authorization (EUA). This EUA will remain in effect (meaning this test can be used) for the duration of the COVID-19 declaration under Section 564(b)(1) of the Act, 21 U.S.C. section 360bbb-3(b)(1), unless the authorization is terminated or revoked.  Performed at [facility], [address] [place], [place] [address]   Culture, blood  (Routine x 2)     Status: None (Preliminary result)   Collection Time: 10/25/24  5:11 PM   Specimen: BLOOD  Result Value Ref Range Status   Specimen Description BLOOD BLOOD LEFT WRIST  Final   Special Requests   Final    BOTTLES DRAWN AEROBIC AND ANAEROBIC Blood Culture results may not be optimal due to an inadequate volume of blood received in culture bottles   Culture   Final    NO GROWTH < 12 HOURS Performed at [facility], [address], [place], [place] [address]    Report Status PENDING  Incomplete     Radiology Studies: CT Angio Chest PE W and/or Wo Contrast Result Date: 10/26/2024 EXAM: CTA of the Chest with contrast for PE 10/26/2024 01:52:04 AM TECHNIQUE: CTA of the chest was performed without and with the administration of 100 mL of iohexol  (OMNIPAQUE ) 350 MG/ML injection. Multiplanar reformatted images are provided for review. MIP  images are provided for review. Automated exposure control, iterative reconstruction, and/or weight based adjustment of the mA/kV was utilized to reduce the radiation dose to as low as reasonably achievable. COMPARISON: None available. CLINICAL HISTORY: Pulmonary embolism (PE) suspected. Altered mental status, lethargy, generalized weakness. FINDINGS: PULMONARY ARTERIES: Pulmonary arteries are adequately opacified for evaluation. No pulmonary embolism. The central pulmonary arteries are enlarged in keeping with changes of pulmonary arterial hypertension. MEDIASTINUM: Extensive multivessel coronary artery calcifications. Small pericardial effusion. Moderate atherosclerotic calcification within the thoracic aorta. There is no acute abnormality of the thoracic aorta. LYMPH NODES: No mediastinal, hilar or axillary lymphadenopathy. LUNGS AND PLEURA: Scattered bilateral linear atelectasis. No focal consolidation or pulmonary edema. No pleural effusion or pneumothorax. UPPER ABDOMEN: 4.3 cm cystic lesion within the mid body of the pancreas is not well  assessed on this examination. Another cystic lesion is also noted, size not specified. These appear stable since prior CT examination of the abdomen and pelvis of 05/06/2024. SOFT TISSUES AND BONES: T12 anterior wedge compression deformity with approximately 50% loss of height anteriorly and T7 compression deformity with 50 - 60% loss of height, appear remote in nature. Remote superior endplate fracture of T3 appears remote. Remote, healed transverse sternal body fracture and manubrial fractures. Healed bilateral rib fractures noted. No acute bone or soft tissue abnormality. IMPRESSION: 1. No pulmonary embolism. 2. Enlarged central pulmonary arteries, consistent with pulmonary arterial hypertension. 3. Small pericardial effusion. 4. Extensive multivessel coronary artery calcifications. 5. Additional incidental findings as noted above. 6. RAF score includes aortic atherosclerosis (ICD10-I70.0). Electronically signed by: Dorethia Molt MD MD 10/26/2024 02:03 AM EST RP Workstation: [id number]   CT ABDOMEN PELVIS W CONTRAST Result Date: 10/26/2024 EXAM: CT ABDOMEN AND PELVIS WITH CONTRAST 10/26/2024 01:52:04 AM TECHNIQUE: CT of the abdomen and pelvis was performed with the administration of 100 mL of iohexol  (OMNIPAQUE ) 350 MG/ML injection. Multiplanar reformatted images are provided for review. Automated exposure control, iterative reconstruction, and/or weight-based adjustment of the mA/kV was utilized to reduce the radiation dose to as low as reasonably achievable. COMPARISON: 05/06/2024 CLINICAL HISTORY: Sepsis FINDINGS: LOWER CHEST: Dedicated CTA chest reported separately. LIVER: The liver is unremarkable. GALLBLADDER AND BILE DUCTS: Gallbladder is unremarkable. No biliary ductal dilatation. SPLEEN: No acute abnormality. PANCREAS: Dominant 4.3 x 3.3 cm cystic lesion along the pancreatic body, likely reflecting a pseudocyst (image 25), previously 3.2 x 2.9 cm. Adjacent 14 mm pseudocyst in the pancreatic neck,  previously 12 mm. ADRENAL GLANDS: No acute abnormality. KIDNEYS, URETERS AND BLADDER: 2.0 cm complex cystic lesion in the interpolar right kidney with suspected enhancing septations (image 35), previously 14 mm, suspicious for cystic renal neoplasm (Bosniak 3). No stones in the kidneys or ureters. No hydronephrosis. No perinephric or periureteral stranding. Urinary bladder is unremarkable. GI AND BOWEL: Stomach demonstrates no acute abnormality. Mild to moderate colonic stool burden. There is no bowel obstruction. PERITONEUM AND RETROPERITONEUM: No ascites. No free air. VASCULATURE: Aorta is normal in caliber. Aortic atherosclerosis. LYMPH NODES: No lymphadenopathy. REPRODUCTIVE ORGANS: No acute abnormality. BONES AND SOFT TISSUES: Moderate compression fracture deformities at L1 and L3, chronic. No focal soft tissue abnormality. IMPRESSION: 1. Mildly progressive pancreatic pseudocysts. 2. Progressive 2.0 cm complex cystic lesion in the interpolar right kidney, suspicious for cystic renal neoplasm (Bosniak III). Urology consultation is suggested with consideration of follow-up MR abdomen with/without contrast, as clinically warranted. Electronically signed by: Pinkie Pebbles MD 10/26/2024 02:02 AM EST RP Workstation: [id number]   CT Head Wo Contrast Result Date: 10/26/2024 EXAM: CT HEAD  WITHOUT CONTRAST 10/26/2024 01:52:04 AM TECHNIQUE: CT of the head was performed without the administration of intravenous contrast. Automated exposure control, iterative reconstruction, and/or weight based adjustment of the mA/kV was utilized to reduce the radiation dose to as low as reasonably achievable. COMPARISON: 05/06/2024 CLINICAL HISTORY: Mental status change, unknown cause. FINDINGS: BRAIN AND VENTRICLES: No acute hemorrhage. No evidence of acute infarct. Cerebral ventricle sizes are concordant with the degree of cerebral volume loss. Patchy and confluent areas of decreased attenuation are noted throughout the deep and  periventricular white matter of the cerebral hemispheres bilaterally suggestive of chronic microvascular ischemic changes. No hydrocephalus. No extra-axial collection. No mass effect or midline shift. Moderate calcific atheromatous disease within carotid siphons and vertebral arteries. ORBITS: No acute abnormality. SINUSES: No acute abnormality. SOFT TISSUES AND SKULL: No acute soft tissue abnormality. No skull fracture. IMPRESSION: 1. No acute intracranial abnormality. Electronically signed by: Morgane Naveau MD MD 10/26/2024 01:56 AM EST RP Workstation: [id number]   DG Chest 2 View if patient is not in a treatment room. Result Date: 10/25/2024 EXAM: 2 VIEW(S) XRAY OF THE CHEST 10/25/2024 05:36:00 PM COMPARISON: 05/06/2024 CLINICAL HISTORY: Suspected Sepsis FINDINGS: LUNGS AND PLEURA: Low lung volumes. Bibasilar atelectasis. Diffuse interstitial coarsening. No focal pulmonary opacity. No pleural effusion. No pneumothorax. HEART AND MEDIASTINUM: Aortic atherosclerotic calcification. No acute abnormality of the cardiac and mediastinal silhouettes. BONES AND SOFT TISSUES: Stable T12 compression deformity. No acute osseous abnormality. IMPRESSION: 1. Low lung volumes with bibasilar atelectasis and diffuse interstitial coarsening. 2. Aortic atherosclerotic calcification. 3. Stable T12 compression deformity. Electronically signed by: Dorethia Molt MD MD 10/25/2024 05:50 PM EST RP Workstation: [id number]    Scheduled Meds:  allopurinol   100 mg Oral BID   carvedilol   6.25 mg Oral BID WC   cholecalciferol   1,000 Units Oral Daily   donepezil   10 mg Oral QHS   empagliflozin   25 mg Oral Daily   enoxaparin  (LOVENOX ) injection  40 mg Subcutaneous Q24H   guaiFENesin   600 mg Oral BID   insulin  aspart  0-15 Units Subcutaneous TID WC   insulin  aspart  0-5 Units Subcutaneous QHS   ipratropium-albuterol   3 mL Nebulization QID   losartan   25 mg Oral Daily   memantine   10 mg Oral BID   multivitamin with minerals  1  tablet Oral Daily   pioglitazone   15 mg Oral Daily   polyethylene glycol  17 g Oral Daily   QUEtiapine   100 mg Oral QHS   rosuvastatin   20 mg Oral Daily   sertraline   50 mg Oral QPM   Continuous Infusions:  cefTRIAXone  (ROCEPHIN )  IV     lactated ringers  150 mL/hr (10/26/24 0639)   metronidazole        LOS: 0 days  MDM: Patient is high risk for one or more organ failure.  They necessitate ongoing hospitalization for continued IV therapies and subsequent lab monitoring. Total time spent interpreting labs and vitals, reviewing the medical record, coordinating care amongst consultants and care team members, directly assessing and discussing care with the patient and/or family: 55 min Laree Lock, MD [facility]  To contact the attending physician between 7A-7P please use Epic Chat. To contact the covering physician during after hours 7P-7A, please review Amion.  10/26/2024, 10:23 AM   *This document has been created with the assistance of dictation software. Please excuse typographical errors. *   "

## 2024-10-26 NOTE — Assessment & Plan Note (Signed)
 Will continue allopurinol .

## 2024-10-26 NOTE — H&P (Signed)
 "     [place]   PATIENT NAME: Anitria Andon    MR#:  [id number]  DATE OF BIRTH:  11-Feb-1951  DATE OF ADMISSION:  10/25/2024  PRIMARY CARE PHYSICIAN: Sowles, Krichna, MD   Patient is coming from: Home  REQUESTING/REFERRING PHYSICIAN: Gordan Huxley, MD  CHIEF COMPLAINT:   Chief Complaint  Patient presents with   Altered Mental Status    HISTORY OF PRESENT ILLNESS:  SAREN CORKERN is a 74 y.o. Caucasian female with medical history significant for gout, anxiety, depression, dementia, type 2 diabetes mellitus, fibromyalgia, dyslipidemia, hypertension, and stage IIIa chronic kidney disease, who presented to the emergency room with onset of altered mental status with increased confusion and generalized weakness with diminished energy and activity level over the last 10 days.  She has been having associated dyspnea without cough or wheezing.  No chest pain or palpitations.  No nausea or vomiting or abdominal pain.  No dysuria, oliguria or hematuria or flank pain.  History is limited by the patient's dementia.  He has been getting poor p.o. intake.  ED Course: When she came to the ER, BP was 141/92 with heart rate 119 respiratory to 24 with otherwise normal vital signs.  Labs revealed sodium of 147 with glucose of 189 with a BUN of 26 and creatinine 1.07 calcium  10.9.  Lactic acid was 2.6 and later 0.8.  CBC showed hemoglobin 11.4 hematocrit 30.7. EKG as reviewed by me : EKG showed sinus tachycardia with rate 117 with low voltage QRS. Imaging: 2 view chest x-ray showed low lung volumes with bibasal atelectasis and diffuse interstitial coarsening and stable T12 compression deformity.  The patient was given 1 L bolus of IV normal saline and 2 g of IV Rocephin  and 500 mg of IV Flagyl .  She will be admitted to a medical telemetry bed for further evaluation and management. PAST MEDICAL HISTORY:   Past Medical History:  Diagnosis Date   Acute gout    Allergy ace inhibitors   Anemia of chronic  disease    Anxiety    Basal cell carcinoma    Cataract    Depression, major, recurrent, mild    Diabetes mellitus without complication (HCC)    Fibromyalgia syndrome    Hyperlipidemia    Hypertension    Osteoporosis    Retinopathy    Stage 3a chronic kidney disease (HCC) 05/08/2022  Dementia  PAST SURGICAL HISTORY:   Past Surgical History:  Procedure Laterality Date   CESAREAN SECTION     TONSILECTOMY, ADENOIDECTOMY, BILATERAL MYRINGOTOMY AND TUBES     TUBAL LIGATION      SOCIAL HISTORY:   Social History   Tobacco Use   Smoking status: Never   Smokeless tobacco: Never   Tobacco comments:    smoking cessation materials not required  Substance Use Topics   Alcohol use: No    FAMILY HISTORY:   Family History  Problem Relation Age of Onset   Hypertension Mother    Osteoporosis Mother    Cancer Mother        brain tumor   Hodgkin's lymphoma Father    Early death Father    Hypertension Sister    Cancer Sister        unknown   Hypertension Sister    Diabetes Sister    Cancer Sister 49       breast   Breast cancer Sister 70   Cancer Maternal Uncle     DRUG ALLERGIES:  Allergies[1]  REVIEW  OF SYSTEMS:   ROS As per history of present illness. All pertinent systems were reviewed above. Constitutional, HEENT, cardiovascular, respiratory, GI, GU, musculoskeletal, neuro, psychiatric, endocrine, integumentary and hematologic systems were reviewed and are otherwise negative/unremarkable except for positive findings mentioned above in the HPI.   MEDICATIONS AT HOME:   Prior to Admission medications  Medication Sig Start Date End Date Taking? Authorizing Provider  allopurinol  (ZYLOPRIM ) 100 MG tablet Take 1 tablet (100 mg total) by mouth 2 (two) times daily. 09/08/24   Sowles, Krichna, MD  carvedilol  (COREG ) 6.25 MG tablet Take 1 tablet (6.25 mg total) by mouth 2 (two) times daily with a meal. 09/08/24   Glenard, Dorette, MD  cholecalciferol  (VITAMIN D3) 25 MCG  (1000 UNIT) tablet Take 1 tablet (1,000 Units total) by mouth daily. 11/17/22   Wouk, Devaughn Sayres, MD  donepezil  (ARICEPT ) 10 MG tablet Take 1 tablet (10 mg total) by mouth at bedtime. 07/04/18   Sowles, Krichna, MD  empagliflozin  (JARDIANCE ) 25 MG TABS tablet Take 1 tablet (25 mg total) by mouth daily. 09/08/24   Sowles, Krichna, MD  losartan  (COZAAR ) 25 MG tablet Take 1 tablet (25 mg total) by mouth daily. 09/08/24   Sowles, Krichna, MD  memantine  (NAMENDA ) 10 MG tablet Take 10 mg by mouth 2 (two) times daily. 06/14/20   [provider]  metFORMIN  (GLUCOPHAGE -XR) 500 MG 24 hr tablet Take 1 tablet (500 mg total) by mouth 2 (two) times daily with a meal. 09/08/24   Sowles, Krichna, MD  MULTIPLE VITAMIN PO Take 1 tablet by mouth daily. 12/28/10   [provider]  pioglitazone  (ACTOS ) 15 MG tablet Take 1 tablet (15 mg total) by mouth daily. 09/08/24   Sowles, Krichna, MD  polyethylene glycol powder (GLYCOLAX /MIRALAX ) 17 GM/SCOOP powder Take 17 g by mouth daily. 05/01/24   Sowles, Krichna, MD  QUEtiapine  (SEROQUEL ) 100 MG tablet Take 1 tablet (100 mg total) by mouth at bedtime. 08/16/23   Regalado, Belkys A, MD  rosuvastatin  (CRESTOR ) 20 MG tablet Take 1 tablet (20 mg total) by mouth daily. 09/08/24   Sowles, Krichna, MD  sertraline  (ZOLOFT ) 50 MG tablet Take 50 mg by mouth every evening.    [provider]  sulfamethoxazole -trimethoprim  (BACTRIM ) 400-80 MG tablet Take 1 tablet by mouth once a week. 09/08/24   Sowles, Krichna, MD      VITAL SIGNS:  Blood pressure (!) 148/121, pulse (!) 106, temperature 98 F (36.7 C), temperature source Axillary, resp. rate 17, height 4' 11 (1.499 m), weight 63.5 kg, SpO2 95%.  PHYSICAL EXAMINATION:  Physical Exam  GENERAL:  74 y.o.-year-old patient lying in the bed with no acute distress.  EYES: Pupils equal, round, reactive to light and accommodation. No scleral icterus. Extraocular muscles intact.  HEENT: Head atraumatic, normocephalic.  Oropharynx and nasopharynx clear.  NECK:  Supple, no jugular venous distention. No thyroid  enlargement, no tenderness.  LUNGS: Diminished bibasilar breath sounds with minimal bibasal crackles.  No use of accessory muscles of respiration.  CARDIOVASCULAR: Regular rate and tachycardic rhythm, S1, S2 normal. No murmurs, rubs, or gallops.  ABDOMEN: Soft, nondistended, nontender. Bowel sounds present. No organomegaly or mass.  EXTREMITIES: No pedal edema, cyanosis, or clubbing.  NEUROLOGIC: Cranial nerves II through XII are intact. Muscle strength 5/5 in all extremities. Sensation intact. Gait not checked.  PSYCHIATRIC: The patient is alert and disoriented to time and place..  Normal affect and good eye contact. SKIN: No obvious rash, lesion, or ulcer.   LABORATORY PANEL:   CBC  Recent Labs  Lab 10/25/24 1718  WBC 7.6  HGB 11.4*  HCT 36.7  PLT 174   ------------------------------------------------------------------------------------------------------------------  Chemistries  Recent Labs  Lab 10/25/24 1708  NA 147*  K 4.0  CL 107  CO2 25  GLUCOSE 189*  BUN 26*  CREATININE 1.07*  CALCIUM  10.9*  AST 22  ALT 16  ALKPHOS 97  BILITOT 0.3   ------------------------------------------------------------------------------------------------------------------  Cardiac Enzymes No results for input(s): TROPONINI in the last 168 hours. ------------------------------------------------------------------------------------------------------------------  RADIOLOGY:  CT Angio Chest PE W and/or Wo Contrast Result Date: 10/26/2024 EXAM: CTA of the Chest with contrast for PE 10/26/2024 01:52:04 AM TECHNIQUE: CTA of the chest was performed without and with the administration of 100 mL of iohexol  (OMNIPAQUE ) 350 MG/ML injection. Multiplanar reformatted images are provided for review. MIP images are provided for review. Automated exposure control, iterative reconstruction, and/or weight based  adjustment of the mA/kV was utilized to reduce the radiation dose to as low as reasonably achievable. COMPARISON: None available. CLINICAL HISTORY: Pulmonary embolism (PE) suspected. Altered mental status, lethargy, generalized weakness. FINDINGS: PULMONARY ARTERIES: Pulmonary arteries are adequately opacified for evaluation. No pulmonary embolism. The central pulmonary arteries are enlarged in keeping with changes of pulmonary arterial hypertension. MEDIASTINUM: Extensive multivessel coronary artery calcifications. Small pericardial effusion. Moderate atherosclerotic calcification within the thoracic aorta. There is no acute abnormality of the thoracic aorta. LYMPH NODES: No mediastinal, hilar or axillary lymphadenopathy. LUNGS AND PLEURA: Scattered bilateral linear atelectasis. No focal consolidation or pulmonary edema. No pleural effusion or pneumothorax. UPPER ABDOMEN: 4.3 cm cystic lesion within the mid body of the pancreas is not well assessed on this examination. Another cystic lesion is also noted, size not specified. These appear stable since prior CT examination of the abdomen and pelvis of 05/06/2024. SOFT TISSUES AND BONES: T12 anterior wedge compression deformity with approximately 50% loss of height anteriorly and T7 compression deformity with 50 - 60% loss of height, appear remote in nature. Remote superior endplate fracture of T3 appears remote. Remote, healed transverse sternal body fracture and manubrial fractures. Healed bilateral rib fractures noted. No acute bone or soft tissue abnormality. IMPRESSION: 1. No pulmonary embolism. 2. Enlarged central pulmonary arteries, consistent with pulmonary arterial hypertension. 3. Small pericardial effusion. 4. Extensive multivessel coronary artery calcifications. 5. Additional incidental findings as noted above. 6. RAF score includes aortic atherosclerosis (ICD10-I70.0). Electronically signed by: Dorethia Molt MD MD 10/26/2024 02:03 AM EST RP Workstation:  [id number]   CT ABDOMEN PELVIS W CONTRAST Result Date: 10/26/2024 EXAM: CT ABDOMEN AND PELVIS WITH CONTRAST 10/26/2024 01:52:04 AM TECHNIQUE: CT of the abdomen and pelvis was performed with the administration of 100 mL of iohexol  (OMNIPAQUE ) 350 MG/ML injection. Multiplanar reformatted images are provided for review. Automated exposure control, iterative reconstruction, and/or weight-based adjustment of the mA/kV was utilized to reduce the radiation dose to as low as reasonably achievable. COMPARISON: 05/06/2024 CLINICAL HISTORY: Sepsis FINDINGS: LOWER CHEST: Dedicated CTA chest reported separately. LIVER: The liver is unremarkable. GALLBLADDER AND BILE DUCTS: Gallbladder is unremarkable. No biliary ductal dilatation. SPLEEN: No acute abnormality. PANCREAS: Dominant 4.3 x 3.3 cm cystic lesion along the pancreatic body, likely reflecting a pseudocyst (image 25), previously 3.2 x 2.9 cm. Adjacent 14 mm pseudocyst in the pancreatic neck, previously 12 mm. ADRENAL GLANDS: No acute abnormality. KIDNEYS, URETERS AND BLADDER: 2.0 cm complex cystic lesion in the interpolar right kidney with suspected enhancing septations (image 35), previously 14 mm, suspicious for cystic renal neoplasm (Bosniak 3). No stones in the kidneys or ureters.  No hydronephrosis. No perinephric or periureteral stranding. Urinary bladder is unremarkable. GI AND BOWEL: Stomach demonstrates no acute abnormality. Mild to moderate colonic stool burden. There is no bowel obstruction. PERITONEUM AND RETROPERITONEUM: No ascites. No free air. VASCULATURE: Aorta is normal in caliber. Aortic atherosclerosis. LYMPH NODES: No lymphadenopathy. REPRODUCTIVE ORGANS: No acute abnormality. BONES AND SOFT TISSUES: Moderate compression fracture deformities at L1 and L3, chronic. No focal soft tissue abnormality. IMPRESSION: 1. Mildly progressive pancreatic pseudocysts. 2. Progressive 2.0 cm complex cystic lesion in the interpolar right kidney, suspicious for cystic  renal neoplasm (Bosniak III). Urology consultation is suggested with consideration of follow-up MR abdomen with/without contrast, as clinically warranted. Electronically signed by: Pinkie Pebbles MD 10/26/2024 02:02 AM EST RP Workstation: [id number]   CT Head Wo Contrast Result Date: 10/26/2024 EXAM: CT HEAD WITHOUT CONTRAST 10/26/2024 01:52:04 AM TECHNIQUE: CT of the head was performed without the administration of intravenous contrast. Automated exposure control, iterative reconstruction, and/or weight based adjustment of the mA/kV was utilized to reduce the radiation dose to as low as reasonably achievable. COMPARISON: 05/06/2024 CLINICAL HISTORY: Mental status change, unknown cause. FINDINGS: BRAIN AND VENTRICLES: No acute hemorrhage. No evidence of acute infarct. Cerebral ventricle sizes are concordant with the degree of cerebral volume loss. Patchy and confluent areas of decreased attenuation are noted throughout the deep and periventricular white matter of the cerebral hemispheres bilaterally suggestive of chronic microvascular ischemic changes. No hydrocephalus. No extra-axial collection. No mass effect or midline shift. Moderate calcific atheromatous disease within carotid siphons and vertebral arteries. ORBITS: No acute abnormality. SINUSES: No acute abnormality. SOFT TISSUES AND SKULL: No acute soft tissue abnormality. No skull fracture. IMPRESSION: 1. No acute intracranial abnormality. Electronically signed by: Morgane Naveau MD MD 10/26/2024 01:56 AM EST RP Workstation: [id number]   DG Chest 2 View if patient is not in a treatment room. Result Date: 10/25/2024 EXAM: 2 VIEW(S) XRAY OF THE CHEST 10/25/2024 05:36:00 PM COMPARISON: 05/06/2024 CLINICAL HISTORY: Suspected Sepsis FINDINGS: LUNGS AND PLEURA: Low lung volumes. Bibasilar atelectasis. Diffuse interstitial coarsening. No focal pulmonary opacity. No pleural effusion. No pneumothorax. HEART AND MEDIASTINUM: Aortic atherosclerotic  calcification. No acute abnormality of the cardiac and mediastinal silhouettes. BONES AND SOFT TISSUES: Stable T12 compression deformity. No acute osseous abnormality. IMPRESSION: 1. Low lung volumes with bibasilar atelectasis and diffuse interstitial coarsening. 2. Aortic atherosclerotic calcification. 3. Stable T12 compression deformity. Electronically signed by: Dorethia Molt MD MD 10/25/2024 05:50 PM EST RP Workstation: [id number]      IMPRESSION AND PLAN:  Assessment and Plan: * Acute encephalopathy - She will be admitted to a medical telemetry bed. - Will follow neurochecks every 4 hours for 24 hours. - This could be secondary to bile depletion and dehydration in addition to #2.  Sepsis due to undetermined organism (HCC) - I suspect respiratory etiology given her dyspnea and tachypnea in the setting of Tachycardia and elevated lactic acid of 2.6. - She will be admitted to a telemetry bed as mentioned above. - Will continue to bag therapy with IV Rocephin  and Flagyl .  Later is given risk given for the possibility of aspiration. - Mucolytic therapy and bronchodilator therapy will be provided. - Will follow blood cultures.  Dementia with behavioral disturbance (HCC) - Will continue Aricept , Namenda  and Seroquel .  Controlled type 2 diabetes mellitus without complication, without long-term current use of insulin  (HCC) - The patient will be placed on supplemental coverage with NovoLog . - Will continue Jardiance .  Anxiety and depression Will continue Zoloft  and Seroquel .  Essential hypertension -  Continue antihypertensive therapy.  Gout - Will continue allopurinol .   DVT prophylaxis: Lovenox .  Advanced Care Planning:  Code Status: full code.  Family Communication:  The plan of care was discussed in details with the patient (and family). I answered all questions. The patient agreed to proceed with the above mentioned plan. Further management will depend upon hospital  course. Disposition Plan: Back to previous home environment Consults called: none.  All the records are reviewed and case discussed with ED provider.  Status is: Inpatient  At the time of the admission, it appears that the appropriate admission status for this patient is inpatient.  This is judged to be reasonable and necessary in order to provide the required intensity of service to ensure the patient's safety given the presenting symptoms, physical exam findings and initial radiographic and laboratory data in the context of comorbid conditions.  The patient requires inpatient status due to high intensity of service, high risk of further deterioration and high frequency of surveillance required.  I certify that at the time of admission, it is my clinical judgment that the patient will require inpatient hospital care extending more than 2 midnights.                            Dispo: The patient is from: Home              Anticipated d/c is to: Home              Patient currently is not medically stable to d/c.              Difficult to place patient: No  Madison DELENA Peaches M.D on 10/26/2024 at 6:32 AM  [facility]   From 7 PM-7 AM, contact night-coverage [email or web address]  CC: Primary care physician; Sowles, Krichna, MD     [1]  Allergies Allergen Reactions   Ace Inhibitors    "

## 2024-10-26 NOTE — ED Notes (Signed)
 Socks, fall risk bracelet and bed alarm on

## 2024-10-26 NOTE — Assessment & Plan Note (Signed)
-   I suspect respiratory etiology given her dyspnea and tachypnea in the setting of Tachycardia and elevated lactic acid of 2.6. - She will be admitted to a telemetry bed as mentioned above. - Will continue to bag therapy with IV Rocephin  and Flagyl .  Later is given risk given for the possibility of aspiration. - Mucolytic therapy and bronchodilator therapy will be provided. - Will follow blood cultures.

## 2024-10-26 NOTE — Assessment & Plan Note (Signed)
-   She will be admitted to a medical telemetry bed. - Will follow neurochecks every 4 hours for 24 hours. - This could be secondary to bile depletion and dehydration in addition to #2.

## 2024-10-26 NOTE — ED Provider Notes (Signed)
----------------------------------------- °  12:27 AM on 10/26/2024 -----------------------------------------  Blood pressure (!) 166/114, pulse (!) 104, temperature 98 F (36.7 C), resp. rate 14, height 1.499 m (4' 11), weight 63.5 kg, SpO2 100%.   Assuming care from Dr. Viviann.  In short, Lindsey [name] is a 74 y.o. female with a chief complaint of generalized weakness, SOB, AMS.  Refer to the original H&P for additional details.  The current plan of care is to admit after imaging rules out emergent condition requiring surgical intervention.   Clinical Course as of 10/26/24 0318  Mon Oct 26, 2024  0104 Lactic Acid, Venous: 0.8 [CF]  0206 I independently viewed and interpreted the patient's CT head and there is no indication of acute intracranial abnormality such as a bleed or neoplasm. [CF]  0206 CT Angio Chest PE W and/or Wo Contrast I independently viewed and interpret the patient's CTA chest and I see no evidence of pulmonary embolism or obvious pneumonia.  Radiologist identified probable pulmonary hypertension, small pericardial effusion, no other acute issue identified [CF]  0208 I am consulting the hospitalist team for admission.   [CF]  0225 I consulted by phone with the admitting hospitalist, and they will admit the patient - Dr. Lawence [CF]    Clinical Course User Index [CF] Gordan Huxley, MD     Medications  metroNIDAZOLE  (FLAGYL ) IVPB 500 mg (500 mg Intravenous New Bag/Given 10/26/24 0249)  sodium chloride  0.9 % bolus 1,000 mL (0 mLs Intravenous Stopped 10/25/24 2053)  cefTRIAXone  (ROCEPHIN ) 2 g in sodium chloride  0.9 % 100 mL IVPB (0 g Intravenous Stopped 10/26/24 0222)  iohexol  (OMNIPAQUE ) 350 MG/ML injection 100 mL (100 mLs Intravenous Contrast Given 10/26/24 0123)     ED Discharge Orders     None      Final diagnoses:  Generalized weakness  Chronic dementia (HCC)  Type 2 diabetes mellitus without complication, without long-term current use of insulin  (HCC)   Renal cyst, right  Pancreatic pseudocyst  Acute encephalopathy  Hypernatremia  Dehydration     Gordan Huxley, MD 10/26/24 [phone]

## 2024-10-26 NOTE — Assessment & Plan Note (Signed)
 Will continue Zoloft  and Seroquel .

## 2024-10-26 NOTE — ED Notes (Signed)
 CBG 120

## 2024-10-26 NOTE — Assessment & Plan Note (Signed)
-   The patient will be placed on supplemental coverage with NovoLog. - Will continue Jardiance.

## 2024-10-26 NOTE — Assessment & Plan Note (Signed)
 -  Continue antihypertensive therapy ?

## 2024-10-27 DIAGNOSIS — G9340 Encephalopathy, unspecified: Secondary | ICD-10-CM | POA: Diagnosis not present

## 2024-10-27 LAB — ECHOCARDIOGRAM COMPLETE
Area-P 1/2: 5.13 cm2
Height: 59 in
S' Lateral: 2.7 cm
Weight: 2240 [oz_av]

## 2024-10-27 LAB — BASIC METABOLIC PANEL WITH GFR
Anion gap: 15 (ref 5–15)
BUN: 17 mg/dL (ref 8–23)
CO2: 24 mmol/L (ref 22–32)
Calcium: 10.8 mg/dL — ABNORMAL HIGH (ref 8.9–10.3)
Chloride: 104 mmol/L (ref 98–111)
Creatinine, Ser: 0.85 mg/dL (ref 0.44–1.00)
GFR, Estimated: >60 mL/min
Glucose, Bld: 134 mg/dL — ABNORMAL HIGH (ref 70–99)
Potassium: 3.7 mmol/L (ref 3.5–5.1)
Sodium: 142 mmol/L (ref 135–145)

## 2024-10-27 LAB — GLUCOSE, CAPILLARY
Glucose-Capillary: 124 mg/dL — ABNORMAL HIGH (ref 70–99)
Glucose-Capillary: 132 mg/dL — ABNORMAL HIGH (ref 70–99)
Glucose-Capillary: 127 mg/dL — ABNORMAL HIGH (ref 70–99)

## 2024-10-27 LAB — CBC
HCT: 35.8 % — ABNORMAL LOW (ref 36.0–46.0)
Hemoglobin: 11.5 g/dL — ABNORMAL LOW (ref 12.0–15.0)
MCH: 28.8 pg (ref 26.0–34.0)
MCHC: 32.1 g/dL (ref 30.0–36.0)
MCV: 89.5 fL (ref 80.0–100.0)
Platelets: 183 K/uL (ref 150–400)
RBC: 4 MIL/uL (ref 3.87–5.11)
RDW: 14.1 % (ref 11.5–15.5)
WBC: 8.4 K/uL (ref 4.0–10.5)
nRBC: 0 % (ref 0.0–0.2)

## 2024-10-27 LAB — TSH: TSH: 4.17 u[IU]/mL (ref 0.350–4.500)

## 2024-10-27 LAB — PRO BRAIN NATRIURETIC PEPTIDE: Pro Brain Natriuretic Peptide: 1059 pg/mL — ABNORMAL HIGH

## 2024-10-27 MED ORDER — LACTATED RINGERS IV SOLN: INTRAVENOUS | Status: DC

## 2024-10-27 MED ORDER — PIPERACILLIN-TAZOBACTAM 3.375 G IVPB
3.3750 g | Freq: Three times a day (TID) | INTRAVENOUS | Status: DC
  Administered 2024-10-27 – 2024-11-02 (×18): 3.375 g via INTRAVENOUS
  Filled 2024-10-27 (×18): qty 50

## 2024-10-27 NOTE — Evaluation (Signed)
 Physical Therapy Evaluation Patient Details Name: Lindsey [name] MRN: [id number] DOB: 09-Oct-1951 Today's Date: 10/27/2024  History of Present Illness  74 y.o. Caucasian female with medical history significant for gout, anxiety, depression, dementia, type 2 diabetes mellitus, fibromyalgia, dyslipidemia, hypertension, and stage IIIa chronic kidney disease, who presented to the emergency room with onset of altered mental status with increased confusion and generalized weakness with diminished energy and activity level over the last 10 days.   Clinical Impression  Patient received in bed, she is alert, but confused and does not follow direction/instruction. Patient not oriented. Her mobility is limited by confusion. Patient requires max/total assist for bed mobility at this time. She will continue to benefit from skilled PT for improvement in mobility/safety as able.           If plan is discharge home, recommend the following: Two people to help with walking and/or transfers;A lot of help with bathing/dressing/bathroom   Can travel by private vehicle   No    Equipment Recommendations Other (comment) (TBD)  Recommendations for Other Services       Functional Status Assessment Patient has had a recent decline in their functional status and demonstrates the ability to make significant improvements in function in a reasonable and predictable amount of time.     Precautions / Restrictions Precautions Precautions: Fall Recall of Precautions/Restrictions: Impaired Restrictions Weight Bearing Restrictions Per Provider Order: No      Mobility  Bed Mobility Overal bed mobility: Needs Assistance Bed Mobility: Rolling, Sidelying to Sit, Sit to Sidelying Rolling: Max assist, Total assist     Sit to supine: Max assist, Total assist Sit to sidelying: Total assist, Max assist General bed mobility comments: Pt is very resistive and unable to follow commands    Transfers                    General transfer comment: did not attempt due to patient confusion    Ambulation/Gait               General Gait Details: did not attempt due to confusion  Stairs            Wheelchair Mobility     Tilt Bed    Modified Rankin (Stroke Patients Only)       Balance   Sitting-balance support: Feet supported Sitting balance-Leahy Scale: Poor                                       Pertinent Vitals/Pain Pain Assessment Pain Assessment: PAINAD Breathing: normal Negative Vocalization: none Facial Expression: smiling or inexpressive Body Language: relaxed Consolability: no need to console PAINAD Score: 0    Home Living Family/patient expects to be discharged to:: Private residence Living Arrangements: Children Available Help at Discharge: Family;Personal care attendant;Available 24 hours/day Type of Home: House Home Access: Stairs to enter Entrance Stairs-Rails: Doctor, General Practice of Steps: 2-3   Home Layout: One level Home Equipment: Agricultural Consultant (2 wheels);Rollator (4 wheels)      Prior Function Prior Level of Function : Needs assist       Physical Assist : Mobility (physical);ADLs (physical) Mobility (physical): Gait   Mobility Comments: Pt ambulates without use of AD but someone is always with her ADLs Comments: Pt has aides 9am-2pm, Family comes 2pm- 6pm, and then daughter arrives at 6pm to stay the rest of the night.  Extremity/Trunk Assessment   Upper Extremity Assessment Upper Extremity Assessment: Defer to OT evaluation    Lower Extremity Assessment Lower Extremity Assessment: Generalized weakness    Cervical / Trunk Assessment Cervical / Trunk Assessment: Normal  Communication   Communication Communication: Impaired Factors Affecting Communication: Hearing impaired;Difficulty expressing self    Cognition Arousal: Alert Behavior During Therapy: Restless   PT - Cognitive impairments:  History of cognitive impairments, No family/caregiver present to determine baseline                         Following commands: Impaired Following commands impaired: Follows one step commands with increased time, Follows one step commands inconsistently     Cueing Cueing Techniques: Verbal cues, Gestural cues, Tactile cues     General Comments      Exercises     Assessment/Plan    PT Assessment Patient needs continued PT services  PT Problem List Decreased strength;Decreased activity tolerance;Decreased mobility;Decreased balance;Decreased cognition;Decreased safety awareness       PT Treatment Interventions Gait training;Functional mobility training;Therapeutic activities;Patient/family education    PT Goals (Current goals can be found in the Care Plan section)  Acute Rehab PT Goals PT Goal Formulation: Patient unable to participate in goal setting Time For Goal Achievement: 11/10/24    Frequency Min 1X/week     Co-evaluation               AM-PAC PT 6 Clicks Mobility  Outcome Measure Help needed turning from your back to your side while in a flat bed without using bedrails?: Total Help needed moving from lying on your back to sitting on the side of a flat bed without using bedrails?: Total Help needed moving to and from a bed to a chair (including a wheelchair)?: Total Help needed standing up from a chair using your arms (e.g., wheelchair or bedside chair)?: Total Help needed to walk in hospital room?: Total Help needed climbing 3-5 steps with a railing? : Total 6 Click Score: 6    End of Session   Activity Tolerance: Other (comment) (limited by confusion/cognition) Patient left: in bed;with call bell/phone within reach;with bed alarm set Nurse Communication: Mobility status PT Visit Diagnosis: Other abnormalities of gait and mobility (R26.89);Unsteadiness on feet (R26.81);Difficulty in walking, not elsewhere classified (R26.2)    Time:  8877-8869 PT Time Calculation (min) (ACUTE ONLY): 8 min   Charges:   PT Evaluation $PT Eval Moderate Complexity: 1 Mod   PT General Charges $$ ACUTE PT VISIT: 1 Visit         Mujtaba Bollig, PT, GCS 10/27/2024,11:39 AM

## 2024-10-27 NOTE — Progress Notes (Signed)
 TOC Dementia Note   Patient Details  Name: Lindsey [name] Date of Birth: January 10, 1951 10/27/2024, 2:31 PM   To Whom It May Concern:  Please be advised that the above-named patient has a primary diagnosis of dementia which supersedes any psychiatric diagnosis.   Transition of Care (TOC) CM/SW Contact: Corean ONEIDA Haddock, RN Phone Number: 10/27/2024, 2:31 PM

## 2024-10-27 NOTE — TOC Initial Note (Addendum)
 Transition of Care [facility]) - Initial/Assessment Note    Patient Details  Name: Lindsey [name] MRN: [id number] Date of Birth: Oct 26, 1950  Transition of Care [facility]) CM/SW Contact:    Corean ONEIDA Haddock, RN Phone Number: 10/27/2024, 2:34 PM  Clinical Narrative:                 Per Chart review Admitted qnm:Jrluz encephalopathy  Admitted from: home with daughter, PCA services PCP: [facility] Current home health/prior home health/DME:RW, rollator   PT recommending SNF, OT recommending HH Daughter Rosaline listed as legal guardian.     VM left for Rosaline to discuss recs  Update: received return call from [place].  She states that patient lives in her own home, and Cos Cob lives with her.  She has private pay care givers 7 days a week from 9am-2pm, and 5 days a week from 2pm-6pm.  Recs reviewed with [place].  She request that patient return home with home health services through [facility].  Referral sent to [facility] with [facility] for review.  Rosaline confirms that patient has a RW and a Rollator at home, and denies the need for any additional DME at discharge    Update  [facility] confirms [facility] can accept.  Accepted in HUB  Patient Goals and CMS Choice            Expected Discharge Plan and Services                                              Prior Living Arrangements/Services                       Activities of Daily Living   ADL Screening (condition at time of admission) Is the patient deaf or have difficulty hearing?: No Does the patient have difficulty seeing, even when wearing glasses/contacts?: No Does the patient have difficulty concentrating, remembering, or making decisions?: No  Permission Sought/Granted                  Emotional Assessment              Admission diagnosis:  Dehydration [E86.0] Hypernatremia [E87.0] Pancreatic pseudocyst [K86.3] Chronic constipation [K59.09] Renal cyst, right [N28.1] Controlled gout [M10.9] Dyslipidemia  associated with type 2 diabetes mellitus (HCC) [E11.69, E78.5] Acute encephalopathy [G93.40] Generalized weakness [R53.1] Chronic dementia (HCC) [F03.90] Type 2 diabetes mellitus without complication, without long-term current use of insulin  (HCC) [E11.9] Stage 3a chronic kidney disease (HCC) [N18.31] Dementia with behavioral disturbance, unspecified dementia type [F03.91] Hypertension associated with stage 3a chronic kidney disease due to type 2 diabetes mellitus (HCC) [E11.22, I12.9, N18.31] Patient Active Problem List   Diagnosis Date Noted   Acute encephalopathy 10/26/2024   Sepsis due to undetermined organism (HCC) 10/26/2024   Essential hypertension 10/26/2024   Anxiety and depression 10/26/2024   Controlled type 2 diabetes mellitus without complication, without long-term current use of insulin  (HCC) 10/26/2024   UTI (urinary tract infection) 05/06/2024   HTN (hypertension) 05/06/2024   Gout 05/06/2024   Depression with anxiety 05/06/2024   Overweight (BMI 25.0-29.9) 05/06/2024   Hypomagnesemia 05/06/2024   Prolonged QT interval 05/06/2024   Type II diabetes mellitus with renal manifestations (HCC) 05/06/2024   Pancreatic cyst 05/01/2024   Chronic constipation 05/01/2024   Renal cyst, right 05/01/2024   Vitamin D  deficiency 05/01/2024   Small vessel disease  05/01/2024   Atherosclerosis of aorta 05/01/2024   Fall 08/05/2023   RSV infection 11/15/2022   Mild protein-calorie malnutrition 05/08/2022   Stage 3a chronic kidney disease (HCC) 05/08/2022   Type 2 diabetes mellitus with microalbuminuria, without long-term current use of insulin  (HCC) 05/08/2022   Pain due to onychomycosis of toenails of both feet 05/04/2019   Dementia with behavioral disturbance (HCC) 01/09/2016   Mild dementia (HCC) 11/01/2015   Auditory hallucination 07/26/2015   Anemia of chronic disease 07/05/2015   Cataract 04/06/2015   Type 2 diabetes mellitus with diabetic cataract (HCC) 04/06/2015    Benign essential HTN 04/02/2015   Controlled gout 04/02/2015   Dyslipidemia 04/02/2015   Fibromyalgia syndrome 04/02/2015   Chronic insomnia 04/02/2015   Cognitive decline 04/02/2015   Nodular basal cell carcinoma 04/02/2015   Osteopenia 04/02/2015   Calcium  blood increased 04/02/2015   PCP:  Sowles, Krichna, MD Pharmacy:   [facility] [address] [place] - [address] [place] [address] Phone: [phone] Fax: [phone]     Social Drivers of Health (SDOH) Social History: SDOH Screenings   Food Insecurity: Patient Unable To Answer (10/26/2024)  Housing: Unknown (10/26/2024)  Transportation Needs: Patient Unable To Answer (10/26/2024)  Utilities: Patient Unable To Answer (10/26/2024)  Alcohol Screen: Low Risk (08/22/2022)  Depression (PHQ2-9): Low Risk (10/23/2024)  Financial Resource Strain: Low Risk (09/04/2024)  Physical Activity: Insufficiently Active (10/23/2024)  Social Connections: Patient Unable To Answer (10/26/2024)  Recent Concern: Social Connections - Socially Isolated (10/23/2024)  Stress: No Stress Concern Present (10/23/2024)  Tobacco Use: Low Risk (10/25/2024)  Health Literacy: Adequate Health Literacy (10/23/2024)   SDOH Interventions:     Readmission Risk Interventions     No data to display

## 2024-10-27 NOTE — NC FL2 (Signed)
 " [place]  MEDICAID FL2 LEVEL OF CARE FORM     IDENTIFICATION  Patient Name: Lindsey [name] Birthdate: 1951/07/17 Sex: female Admission Date (Current Location): 10/25/2024  [facility] [place] and [place] Number:  Chiropodist and Address:         Provider Number: [id number]  Attending Physician Name and Address:  Jerelene Critchley, MD  Relative Name and Phone Number:       Current Level of Care: Hospital Recommended Level of Care: Skilled Nursing Facility Prior Approval Number:    Date Approved/Denied:   PASRR Number: pending  Discharge Plan: SNF    Current Diagnoses: Patient Active Problem List   Diagnosis Date Noted   Acute encephalopathy 10/26/2024   Sepsis due to undetermined organism (HCC) 10/26/2024   Essential hypertension 10/26/2024   Anxiety and depression 10/26/2024   Controlled type 2 diabetes mellitus without complication, without long-term current use of insulin  (HCC) 10/26/2024   UTI (urinary tract infection) 05/06/2024   HTN (hypertension) 05/06/2024   Gout 05/06/2024   Depression with anxiety 05/06/2024   Overweight (BMI 25.0-29.9) 05/06/2024   Hypomagnesemia 05/06/2024   Prolonged QT interval 05/06/2024   Type II diabetes mellitus with renal manifestations (HCC) 05/06/2024   Pancreatic cyst 05/01/2024   Chronic constipation 05/01/2024   Renal cyst, right 05/01/2024   Vitamin D  deficiency 05/01/2024   Small vessel disease 05/01/2024   Atherosclerosis of aorta 05/01/2024   Fall 08/05/2023   RSV infection 11/15/2022   Mild protein-calorie malnutrition 05/08/2022   Stage 3a chronic kidney disease (HCC) 05/08/2022   Type 2 diabetes mellitus with microalbuminuria, without long-term current use of insulin  (HCC) 05/08/2022   Pain due to onychomycosis of toenails of both feet 05/04/2019   Dementia with behavioral disturbance (HCC) 01/09/2016   Mild dementia (HCC) 11/01/2015   Auditory hallucination 07/26/2015   Anemia of chronic disease 07/05/2015    Cataract 04/06/2015   Type 2 diabetes mellitus with diabetic cataract (HCC) 04/06/2015   Benign essential HTN 04/02/2015   Controlled gout 04/02/2015   Dyslipidemia 04/02/2015   Fibromyalgia syndrome 04/02/2015   Chronic insomnia 04/02/2015   Cognitive decline 04/02/2015   Nodular basal cell carcinoma 04/02/2015   Osteopenia 04/02/2015   Calcium  blood increased 04/02/2015    Orientation RESPIRATION BLADDER Height & Weight     Self  Normal Incontinent Weight: 63.5 kg Height:  4' 11 (149.9 cm)  BEHAVIORAL SYMPTOMS/MOOD NEUROLOGICAL BOWEL NUTRITION STATUS      Incontinent Diet (carb modifed)  AMBULATORY STATUS COMMUNICATION OF NEEDS Skin   Total Care Verbally Bruising                       Personal Care Assistance Level of Assistance              Functional Limitations Info             SPECIAL CARE FACTORS FREQUENCY  PT (By licensed PT), OT (By licensed OT)                    Contractures Contractures Info: Not present    Additional Factors Info  Code Status, Allergies, Isolation Precautions Code Status Info: full Allergies Info: Ace inhibitors     Isolation Precautions Info: droplet     Current Medications (10/27/2024):  This is the current hospital active medication list Current Facility-Administered Medications  Medication Dose Route Frequency Provider Last Rate Last Admin   acetaminophen  (TYLENOL ) tablet 650 mg  650 mg Oral Q6H  PRN Mansy, Madison LABOR, MD       Or   acetaminophen  (TYLENOL ) suppository 650 mg  650 mg Rectal Q6H PRN Mansy, Jan A, MD       allopurinol  (ZYLOPRIM ) tablet 100 mg  100 mg Oral BID Mansy, Jan A, MD   100 mg at 10/27/24 0856   carvedilol  (COREG ) tablet 6.25 mg  6.25 mg Oral BID WC Mansy, Jan A, MD   6.25 mg at 10/27/24 0856   cholecalciferol  (VITAMIN D3) 25 MCG (1000 UNIT) tablet 1,000 Units  1,000 Units Oral Daily Mansy, Jan A, MD   1,000 Units at 10/27/24 0856   donepezil  (ARICEPT ) tablet 10 mg  10 mg Oral QHS Mansy, Jan A,  MD   10 mg at 10/26/24 2325   empagliflozin  (JARDIANCE ) tablet 25 mg  25 mg Oral Daily Mansy, Jan A, MD   25 mg at 10/27/24 0856   enoxaparin  (LOVENOX ) injection 40 mg  40 mg Subcutaneous Q24H Mansy, Jan A, MD   40 mg at 10/27/24 0856   guaiFENesin  (MUCINEX ) 12 hr tablet 600 mg  600 mg Oral BID Mansy, Jan A, MD   600 mg at 10/27/24 9143   insulin  aspart (novoLOG ) injection 0-5 Units  0-5 Units Subcutaneous QHS Ponnala, Shruthi, MD       insulin  aspart (novoLOG ) injection 0-9 Units  0-9 Units Subcutaneous TID WC Ponnala, Shruthi, MD   2 Units at 10/26/24 1857   ipratropium-albuterol  (DUONEB) 0.5-2.5 (3) MG/3ML nebulizer solution 3 mL  3 mL Nebulization Q4H PRN Dail Rankin RAMAN, RPH       losartan  (COZAAR ) tablet 25 mg  25 mg Oral Daily Mansy, Jan A, MD   25 mg at 10/27/24 9143   magnesium  hydroxide (MILK OF MAGNESIA) suspension 30 mL  30 mL Oral Daily PRN Mansy, Jan A, MD       memantine  (NAMENDA ) tablet 10 mg  10 mg Oral BID Mansy, Jan A, MD   10 mg at 10/27/24 9143   multivitamin with minerals tablet 1 tablet  1 tablet Oral Daily Mansy, Jan A, MD   1 tablet at 10/27/24 0856   pioglitazone  (ACTOS ) tablet 15 mg  15 mg Oral Daily Mansy, Jan A, MD   15 mg at 10/27/24 9143   polyethylene glycol (MIRALAX  / GLYCOLAX ) packet 17 g  17 g Oral Daily PRN Ponnala, Shruthi, MD       rosuvastatin  (CRESTOR ) tablet 20 mg  20 mg Oral Daily Mansy, Jan A, MD   20 mg at 10/27/24 9143     Discharge Medications: Please see discharge summary for a list of discharge medications.  Relevant Imaging Results:  Relevant Lab Results:   Additional Information [id number]  Corean ONEIDA Haddock, RN     "

## 2024-10-27 NOTE — Progress Notes (Signed)
 Mobility Specialist - Progress Note   10/27/24 1218  Mobility  Activity Stood with assistance;Pivoted/transferred to/from [facility]  Level of Assistance Minimal assist, patient does 75% or more  Assistive Device Front wheel walker  Distance Ambulated (ft) 4 ft  Activity Response Tolerated well  Mobility visit 1 Mobility  Mobility Specialist Start Time (ACUTE ONLY) 1154  Mobility Specialist Stop Time (ACUTE ONLY) 1214  Mobility Specialist Time Calculation (min) (ACUTE ONLY) 20 min   Pt supine upon entry, utilizing RA. Pt expressed need to void, agreeable to transfer to the [facility]. She required ModA for bed mob, CGA for trunk support while seated EOB. Pt STS and transferred to the [facility] MinA +2 for safety, requiring directional/sequencing cues throughout session. Pt returned to bed, left supine with alarm set and needs within reach. NT present at bedside.  America Silvan Mobility Specialist 10/27/2024 12:37 PM

## 2024-10-27 NOTE — Progress Notes (Signed)
 30 Day PASRR Note   Patient Details  Name: Lindsey [name] Date of Birth: Mar 20, 1951   Transition of Care [facility]) CM/SW Contact:    Corean ONEIDA Haddock, RN Phone Number: 10/27/2024, 2:31 PM  To Whom It May Concern:  Please be advised that this patient will require a short-term nursing home stay - anticipated 30 days or less for rehabilitation and strengthening.   The plan is for return home.

## 2024-10-27 NOTE — Progress Notes (Addendum)
 " PROGRESS NOTE    Lindsey [name]  FMW:[id number] DOB: 10-24-1950 DOA: 10/25/2024 PCP: Sowles, Krichna, MD  Chief Complaint  Patient presents with   Altered Mental Status    Hospital Course:  Lindsey [name] is a 73 y.o. Caucasian female with medical history significant for gout, anxiety, depression, dementia, type 2 diabetes mellitus, fibromyalgia, dyslipidemia, hypertension, and stage IIIa chronic kidney disease, who presented to the emergency room with onset of altered mental status with increased confusion and generalized weakness with diminished energy and activity level over the last 10 days.  Per daughter patient did not have any focal complaints.  Denies SOB, DOE, chest pain, palpitations, nausea/vomiting, abdominal pain, urinary symptoms.  Limited due to patient's dementia, unclear if she is getting adequate p.o. intake  Patient admitted for acute encephalopathy, possible sepsis vs dehydration, hospital course as below  Subjective: Patient was examined at bedside, step daughter Arlean present at the bedside Reports patient still confused, is not at baseline Called daughter Rosaline to discuss if they would like to do an MRI, did not answer On broad-spectrum antibiotics for possible infectious etiology, cultures pending Continue IV fluids for dehydration   Objective: Vitals:   10/27/24 0512 10/27/24 0844 10/27/24 1544 10/27/24 1610  BP: 138/85 (!) 118/96 (!) 183/102 (!) 168/90  Pulse: (!) 109 (!) 115 (!) 126   Resp: 18 17 17    Temp: 100.3 F (37.9 C) 99.9 F (37.7 C) 98.3 F (36.8 C)   TempSrc:      SpO2: 96% 100% 96%   Weight:      Height:        Intake/Output Summary (Last 24 hours) at 10/27/2024 1735 Last data filed at 10/27/2024 1349 Gross per 24 hour  Intake 240 ml  Output --  Net 240 ml   Filed Weights   10/25/24 1702  Weight: 63.5 kg    Examination: GENERAL:  74 y.o.-year-old patient lying in the bed with no acute distress.  NECK:  Supple, no jugular venous  distention LUNGS: clear to auscultation bilaterally CARDIOVASCULAR: Regular rate and tachycardic rhythm, S1, S2 normal. No murmurs, rubs, or gallops.  ABDOMEN: Soft, nondistended, nontender. Bowel sounds present. No organomegaly or mass.  EXTREMITIES: No pedal edema, cyanosis, or clubbing.  NEUROLOGIC: AO x1, pleasantly confused, no gross focal deficits PSYCHIATRIC: The patient is alert and disoriented to time and place.  Normal affect and good eye contact. SKIN: No obvious rash, lesion, or ulcer  Assessment & Plan:  Acute encephalopathy - unclear etiology Suspect secondary to infectious etiology vs dehydration vs medication, worsening dementia CT head negative for acute changes On antibiotics as below for possible infectious etiology IV fluids for dehydration Hold off on further imaging MRI, per discussion with daughter Hold Seroquel , sertraline    Suspect Sepsis due to undetermined organism Found to be tachycardic on presentation Lactic acidosis resolved, No leukocytosis COVID/flu/RSV negative, UA negative CT with no evidence of acute infection On IV Zosyn  Follow-up blood cultures, RVP, procalcitonin  Sinus tachycardia Unclear etiology, suspect if secondary to sepsis Workup negative for PE On IV fluids for dehydration Pending echo for pericardial effusion, check BNP, TSH On home dose Coreg   Hypernatremia Resolved s/p IV fluids Monitor BMP pm  Mild bump in creatinine Mild hypercalcemia Improved s/p IV fluids  Normocytic anemia Monitor Hb  Small pericardial effusion on CT Will get Echo  Dementia with behavioral disturbance Anxiety and depression continue Aricept , Namenda  Hold Seroquel , Sertraline  due to encephalopathy   Controlled type 2 diabetes mellitus without  complication, without long-term current use of insulin  Hold Home metformin  Continue Jardiance , Actos , SSI   Essential hypertension Continue Losartan , Coreg    Gout continue allopurinol   Right  kidney cystic lesion Progressive 2.0 cm complex cystic lesion in the interpolar right kidney, suspicious for cystic renal neoplasm  Was found on prior CT dated 04/2024 at [facility] Daughter reports chose not to do further workup  Incidental findings  mildly progressive pancreatic pseudocysts Stable T12 compression deformity Probable pulmonary hypertension  Generalized weakness AM cortisol normal TSH PT/OT rec SNF, but daughter would like to take her home with home care   DVT prophylaxis: Lovenox  SQ   Code Status: Full Code Disposition:  Home health  Consultants:  None  Procedures:  None  Antimicrobials:  Anti-infectives (From admission, onward)    Start     Dose/Rate Route Frequency Ordered Stop   10/27/24 1800  piperacillin -tazobactam (ZOSYN ) IVPB 3.375 g        3.375 g 12.5 mL/hr over 240 Minutes Intravenous Every 8 hours 10/27/24 1657     10/26/24 2200  cefTRIAXone  (ROCEPHIN ) 2 g in sodium chloride  0.9 % 100 mL IVPB  Status:  Discontinued        2 g 200 mL/hr over 30 Minutes Intravenous Every 24 hours 10/26/24 0455 10/26/24 1147   10/26/24 1200  metroNIDAZOLE  (FLAGYL ) IVPB 500 mg  Status:  Discontinued        500 mg 100 mL/hr over 60 Minutes Intravenous Every 12 hours 10/26/24 0455 10/26/24 1147   10/26/24 1200  piperacillin -tazobactam (ZOSYN ) IVPB 3.375 g  Status:  Discontinued        3.375 g 12.5 mL/hr over 240 Minutes Intravenous Every 8 hours 10/26/24 1157 10/27/24 1007   10/26/24 0015  cefTRIAXone  (ROCEPHIN ) 2 g in sodium chloride  0.9 % 100 mL IVPB        2 g 200 mL/hr over 30 Minutes Intravenous  Once 10/26/24 0014 10/26/24 0222   10/26/24 0015  metroNIDAZOLE  (FLAGYL ) IVPB 500 mg        500 mg 100 mL/hr over 60 Minutes Intravenous  Once 10/26/24 0014 10/26/24 0349       Data Reviewed: I have personally reviewed following labs and imaging studies CBC: Recent Labs  Lab 10/25/24 1718 10/26/24 0640 10/27/24 0537  WBC 7.6 6.8 8.4  NEUTROABS 4.9  --   --    HGB 11.4* 10.2* 11.5*  HCT 36.7 32.2* 35.8*  MCV 92.2 91.0 89.5  PLT 174 157 183   Basic Metabolic Panel: Recent Labs  Lab 10/25/24 1708 10/26/24 0640 10/26/24 1814 10/27/24 0537  NA 147* 149* 140 142  K 4.0 3.7 3.6 3.7  CL 107 113* 105 104  CO2 25 24 25 24   GLUCOSE 189* 144* 176* 134*  BUN 26* 19 16 17   CREATININE 1.07* 0.82 0.87 0.85  CALCIUM  10.9* 10.5* 10.1 10.8*   GFR: Estimated Creatinine Clearance: 46.3 mL/min (by C-G formula based on SCr of 0.85 mg/dL). Liver Function Tests: Recent Labs  Lab 10/25/24 1708 10/26/24 0640  AST 22  --   ALT 16  --   ALKPHOS 97  --   BILITOT 0.3  --   PROT 7.4  --   ALBUMIN 4.4 4.0   CBG: Recent Labs  Lab 10/26/24 0753 10/26/24 1115 10/26/24 1848 10/26/24 2317 10/27/24 1215  GLUCAP 120* 127* 162* 144* 124*    Recent Results (from the past 240 hours)  Culture, blood (Routine x 2)     Status: None (Preliminary result)  Collection Time: 10/25/24  5:08 PM   Specimen: BLOOD  Result Value Ref Range Status   Specimen Description BLOOD BLOOD RIGHT FOREARM  Final   Special Requests   Final    BOTTLES DRAWN AEROBIC AND ANAEROBIC Blood Culture results may not be optimal due to an inadequate volume of blood received in culture bottles   Culture   Final    NO GROWTH 2 DAYS Performed at [facility], [address], [place], [place] [address]    Report Status PENDING  Incomplete  Resp panel by RT-PCR (RSV, Flu A&B, Covid) Anterior Nasal Swab     Status: None   Collection Time: 10/25/24  5:10 PM   Specimen: Anterior Nasal Swab  Result Value Ref Range Status   SARS Coronavirus 2 by RT PCR NEGATIVE NEGATIVE Final    Comment: (NOTE) SARS-CoV-2 target nucleic acids are NOT DETECTED.  The SARS-CoV-2 RNA is generally detectable in upper respiratory specimens during the acute phase of infection. The lowest concentration of SARS-CoV-2 viral copies this assay can detect is 138 copies/mL. A negative result does not  preclude SARS-Cov-2 infection and should not be used as the sole basis for treatment or other patient management decisions. A negative result may occur with  improper specimen collection/handling, submission of specimen other than nasopharyngeal swab, presence of viral mutation(s) within the areas targeted by this assay, and inadequate number of viral copies(<138 copies/mL). A negative result must be combined with clinical observations, patient history, and epidemiological information. The expected result is Negative.  Fact Sheet for Patients:  bloggercourse.com  Fact Sheet for Healthcare Providers:  seriousbroker.it  This test is no t yet approved or cleared by the [place]  FDA and  has been authorized for detection and/or diagnosis of SARS-CoV-2 by FDA under an Emergency Use Authorization (EUA). This EUA will remain  in effect (meaning this test can be used) for the duration of the COVID-19 declaration under Section 564(b)(1) of the Act, 21 U.S.C.section 360bbb-3(b)(1), unless the authorization is terminated  or revoked sooner.       Influenza A by PCR NEGATIVE NEGATIVE Final   Influenza B by PCR NEGATIVE NEGATIVE Final    Comment: (NOTE) The Xpert Xpress SARS-CoV-2/FLU/RSV plus assay is intended as an aid in the diagnosis of influenza from Nasopharyngeal swab specimens and should not be used as a sole basis for treatment. Nasal washings and aspirates are unacceptable for Xpert Xpress SARS-CoV-2/FLU/RSV testing.  Fact Sheet for Patients: bloggercourse.com  Fact Sheet for Healthcare Providers: seriousbroker.it  This test is not yet approved or cleared by the [place]  FDA and has been authorized for detection and/or diagnosis of SARS-CoV-2 by FDA under an Emergency Use Authorization (EUA). This EUA will remain in effect (meaning this test can be used) for the  duration of the COVID-19 declaration under Section 564(b)(1) of the Act, 21 U.S.C. section 360bbb-3(b)(1), unless the authorization is terminated or revoked.     Resp Syncytial Virus by PCR NEGATIVE NEGATIVE Final    Comment: (NOTE) Fact Sheet for Patients: bloggercourse.com  Fact Sheet for Healthcare Providers: seriousbroker.it  This test is not yet approved or cleared by the [place]  FDA and has been authorized for detection and/or diagnosis of SARS-CoV-2 by FDA under an Emergency Use Authorization (EUA). This EUA will remain in effect (meaning this test can be used) for the duration of the COVID-19 declaration under Section 564(b)(1) of the Act, 21 U.S.C. section 360bbb-3(b)(1), unless the authorization is terminated or revoked.  Performed  at [facility], [address] [place], [place] [address]   Culture, blood (Routine x 2)     Status: None (Preliminary result)   Collection Time: 10/25/24  5:11 PM   Specimen: BLOOD  Result Value Ref Range Status   Specimen Description BLOOD BLOOD LEFT WRIST  Final   Special Requests   Final    BOTTLES DRAWN AEROBIC AND ANAEROBIC Blood Culture results may not be optimal due to an inadequate volume of blood received in culture bottles   Culture   Final    NO GROWTH 2 DAYS Performed at [facility], [address] [place], [place] [address]    Report Status PENDING  Incomplete     Radiology Studies: ECHOCARDIOGRAM COMPLETE Result Date: 10/27/2024    ECHOCARDIOGRAM REPORT   Patient Name:   RAYNI NEMITZ Yochim Date of Exam: 10/26/2024 Medical Rec #:  [id number]     Height:       59.0 in Accession #:    [id number]    Weight:       140.0 lb Date of Birth:  25-Feb-1951    BSA:          1.585 m Patient Age:    73 years      BP:           146/78 mmHg Patient Gender: F             HR:           101 bpm. Exam Location:  [facility] Procedure: 2D Echo, Cardiac Doppler and Color Doppler  (Both Spectral and Color            Flow Doppler were utilized during procedure). Indications:     I31.3 Pericardial Effusion  History:         Patient has no prior history of Echocardiogram examinations.                  Risk Factors:Hypertension, Diabetes and Dyslipidemia.  Sonographer:     Carl Coma RDCS Referring Phys:  [id number] [facility] [place] Samaras Diagnosing Phys: Cara JONETTA Lovelace MD IMPRESSIONS  1. Left ventricular ejection fraction, by estimation, is 60 to 65%. The left ventricle has normal function. The left ventricle has no regional wall motion abnormalities. Left ventricular diastolic parameters are consistent with Grade I diastolic dysfunction (impaired relaxation).  2. Right ventricular systolic function is normal. The right ventricular size is normal.  3. Right atrial size was mildly dilated.  4. The mitral valve is normal in structure. Trivial mitral valve regurgitation.  5. The aortic valve is normal in structure. Aortic valve regurgitation is not visualized. Aortic valve sclerosis is present, with no evidence of aortic valve stenosis. FINDINGS  Left Ventricle: Left ventricular ejection fraction, by estimation, is 60 to 65%. The left ventricle has normal function. The left ventricle has no regional wall motion abnormalities. Strain was performed and the global longitudinal strain is indeterminate. The left ventricular internal cavity size was normal in size. There is no left ventricular hypertrophy. Left ventricular diastolic parameters are consistent with Grade I diastolic dysfunction (impaired relaxation). Right Ventricle: The right ventricular size is normal. No increase in right ventricular wall thickness. Right ventricular systolic function is normal. Left Atrium: Left atrial size was normal in size. Right Atrium: Right atrial size was mildly dilated. Pericardium: There is no evidence of pericardial effusion. Mitral Valve: The mitral valve is normal in structure. Trivial mitral valve  regurgitation. Tricuspid Valve: The tricuspid valve is normal in  structure. Tricuspid valve regurgitation is trivial. Aortic Valve: The aortic valve is normal in structure. Aortic valve regurgitation is not visualized. Aortic valve sclerosis is present, with no evidence of aortic valve stenosis. Pulmonic Valve: The pulmonic valve was not well visualized. Pulmonic valve regurgitation is not visualized. Aorta: The ascending aorta was not well visualized. IAS/Shunts: No atrial level shunt detected by color flow Doppler. Additional Comments: 3D was performed not requiring image post processing on an independent workstation and was indeterminate.  LEFT VENTRICLE PLAX 2D LVIDd:         4.10 cm   Diastology LVIDs:         2.70 cm   LV e' medial:    6.31 cm/s LV PW:         1.00 cm   LV E/e' medial:  10.9 LV IVS:        1.00 cm   LV e' lateral:   6.71 cm/s LVOT diam:     2.10 cm   LV E/e' lateral: 10.3 LV SV:         52 LV SV Index:   33 LVOT Area:     3.46 cm  RIGHT VENTRICLE             IVC RV Basal diam:  3.80 cm     IVC diam: 1.10 cm RV S prime:     16.17 cm/s TAPSE (M-mode): 1.8 cm LEFT ATRIUM             Index        RIGHT ATRIUM           Index LA diam:        4.00 cm 2.52 cm/m   RA Area:     10.20 cm LA Vol (A2C):   36.4 ml 22.97 ml/m  RA Volume:   24.30 ml  15.33 ml/m LA Vol (A4C):   24.1 ml 15.21 ml/m LA Biplane Vol: 32.1 ml 20.25 ml/m  AORTIC VALVE LVOT Vmax:   93.70 cm/s LVOT Vmean:  62.800 cm/s LVOT VTI:    0.151 m  AORTA Ao Root diam: 3.60 cm Ao Asc diam:  3.30 cm MITRAL VALVE MV Area (PHT): 5.13 cm     SHUNTS MV Decel Time: 148 msec     Systemic VTI:  0.15 m MV E velocity: 69.05 cm/s   Systemic Diam: 2.10 cm MV A velocity: 112.00 cm/s MV E/A ratio:  0.62 Dwayne D Callwood MD Electronically signed by Cara JONETTA Lovelace MD Signature Date/Time: 10/27/2024/2:32:18 PM    Final    CT Angio Chest PE W and/or Wo Contrast Result Date: 10/26/2024 EXAM: CTA of the Chest with contrast for PE 10/26/2024 01:52:04  AM TECHNIQUE: CTA of the chest was performed without and with the administration of 100 mL of iohexol  (OMNIPAQUE ) 350 MG/ML injection. Multiplanar reformatted images are provided for review. MIP images are provided for review. Automated exposure control, iterative reconstruction, and/or weight based adjustment of the mA/kV was utilized to reduce the radiation dose to as low as reasonably achievable. COMPARISON: None available. CLINICAL HISTORY: Pulmonary embolism (PE) suspected. Altered mental status, lethargy, generalized weakness. FINDINGS: PULMONARY ARTERIES: Pulmonary arteries are adequately opacified for evaluation. No pulmonary embolism. The central pulmonary arteries are enlarged in keeping with changes of pulmonary arterial hypertension. MEDIASTINUM: Extensive multivessel coronary artery calcifications. Small pericardial effusion. Moderate atherosclerotic calcification within the thoracic aorta. There is no acute abnormality of the thoracic aorta. LYMPH NODES: No mediastinal, hilar or axillary lymphadenopathy. LUNGS AND  PLEURA: Scattered bilateral linear atelectasis. No focal consolidation or pulmonary edema. No pleural effusion or pneumothorax. UPPER ABDOMEN: 4.3 cm cystic lesion within the mid body of the pancreas is not well assessed on this examination. Another cystic lesion is also noted, size not specified. These appear stable since prior CT examination of the abdomen and pelvis of 05/06/2024. SOFT TISSUES AND BONES: T12 anterior wedge compression deformity with approximately 50% loss of height anteriorly and T7 compression deformity with 50 - 60% loss of height, appear remote in nature. Remote superior endplate fracture of T3 appears remote. Remote, healed transverse sternal body fracture and manubrial fractures. Healed bilateral rib fractures noted. No acute bone or soft tissue abnormality. IMPRESSION: 1. No pulmonary embolism. 2. Enlarged central pulmonary arteries, consistent with pulmonary  arterial hypertension. 3. Small pericardial effusion. 4. Extensive multivessel coronary artery calcifications. 5. Additional incidental findings as noted above. 6. RAF score includes aortic atherosclerosis (ICD10-I70.0). Electronically signed by: Dorethia Molt MD MD 10/26/2024 02:03 AM EST RP Workstation: [id number]   CT ABDOMEN PELVIS W CONTRAST Result Date: 10/26/2024 EXAM: CT ABDOMEN AND PELVIS WITH CONTRAST 10/26/2024 01:52:04 AM TECHNIQUE: CT of the abdomen and pelvis was performed with the administration of 100 mL of iohexol  (OMNIPAQUE ) 350 MG/ML injection. Multiplanar reformatted images are provided for review. Automated exposure control, iterative reconstruction, and/or weight-based adjustment of the mA/kV was utilized to reduce the radiation dose to as low as reasonably achievable. COMPARISON: 05/06/2024 CLINICAL HISTORY: Sepsis FINDINGS: LOWER CHEST: Dedicated CTA chest reported separately. LIVER: The liver is unremarkable. GALLBLADDER AND BILE DUCTS: Gallbladder is unremarkable. No biliary ductal dilatation. SPLEEN: No acute abnormality. PANCREAS: Dominant 4.3 x 3.3 cm cystic lesion along the pancreatic body, likely reflecting a pseudocyst (image 25), previously 3.2 x 2.9 cm. Adjacent 14 mm pseudocyst in the pancreatic neck, previously 12 mm. ADRENAL GLANDS: No acute abnormality. KIDNEYS, URETERS AND BLADDER: 2.0 cm complex cystic lesion in the interpolar right kidney with suspected enhancing septations (image 35), previously 14 mm, suspicious for cystic renal neoplasm (Bosniak 3). No stones in the kidneys or ureters. No hydronephrosis. No perinephric or periureteral stranding. Urinary bladder is unremarkable. GI AND BOWEL: Stomach demonstrates no acute abnormality. Mild to moderate colonic stool burden. There is no bowel obstruction. PERITONEUM AND RETROPERITONEUM: No ascites. No free air. VASCULATURE: Aorta is normal in caliber. Aortic atherosclerosis. LYMPH NODES: No lymphadenopathy. REPRODUCTIVE  ORGANS: No acute abnormality. BONES AND SOFT TISSUES: Moderate compression fracture deformities at L1 and L3, chronic. No focal soft tissue abnormality. IMPRESSION: 1. Mildly progressive pancreatic pseudocysts. 2. Progressive 2.0 cm complex cystic lesion in the interpolar right kidney, suspicious for cystic renal neoplasm (Bosniak III). Urology consultation is suggested with consideration of follow-up MR abdomen with/without contrast, as clinically warranted. Electronically signed by: Pinkie Pebbles MD 10/26/2024 02:02 AM EST RP Workstation: [id number]   CT Head Wo Contrast Result Date: 10/26/2024 EXAM: CT HEAD WITHOUT CONTRAST 10/26/2024 01:52:04 AM TECHNIQUE: CT of the head was performed without the administration of intravenous contrast. Automated exposure control, iterative reconstruction, and/or weight based adjustment of the mA/kV was utilized to reduce the radiation dose to as low as reasonably achievable. COMPARISON: 05/06/2024 CLINICAL HISTORY: Mental status change, unknown cause. FINDINGS: BRAIN AND VENTRICLES: No acute hemorrhage. No evidence of acute infarct. Cerebral ventricle sizes are concordant with the degree of cerebral volume loss. Patchy and confluent areas of decreased attenuation are noted throughout the deep and periventricular white matter of the cerebral hemispheres bilaterally suggestive of chronic microvascular ischemic changes. No hydrocephalus. No extra-axial  collection. No mass effect or midline shift. Moderate calcific atheromatous disease within carotid siphons and vertebral arteries. ORBITS: No acute abnormality. SINUSES: No acute abnormality. SOFT TISSUES AND SKULL: No acute soft tissue abnormality. No skull fracture. IMPRESSION: 1. No acute intracranial abnormality. Electronically signed by: Morgane Naveau MD MD 10/26/2024 01:56 AM EST RP Workstation: [id number]   DG Chest 2 View if patient is not in a treatment room. Result Date: 10/25/2024 EXAM: 2 VIEW(S) XRAY OF THE  CHEST 10/25/2024 05:36:00 PM COMPARISON: 05/06/2024 CLINICAL HISTORY: Suspected Sepsis FINDINGS: LUNGS AND PLEURA: Low lung volumes. Bibasilar atelectasis. Diffuse interstitial coarsening. No focal pulmonary opacity. No pleural effusion. No pneumothorax. HEART AND MEDIASTINUM: Aortic atherosclerotic calcification. No acute abnormality of the cardiac and mediastinal silhouettes. BONES AND SOFT TISSUES: Stable T12 compression deformity. No acute osseous abnormality. IMPRESSION: 1. Low lung volumes with bibasilar atelectasis and diffuse interstitial coarsening. 2. Aortic atherosclerotic calcification. 3. Stable T12 compression deformity. Electronically signed by: Dorethia Molt MD MD 10/25/2024 05:50 PM EST RP Workstation: [id number]    Scheduled Meds:  allopurinol   100 mg Oral BID   carvedilol   6.25 mg Oral BID WC   cholecalciferol   1,000 Units Oral Daily   donepezil   10 mg Oral QHS   empagliflozin   25 mg Oral Daily   enoxaparin  (LOVENOX ) injection  40 mg Subcutaneous Q24H   guaiFENesin   600 mg Oral BID   insulin  aspart  0-5 Units Subcutaneous QHS   insulin  aspart  0-9 Units Subcutaneous TID WC   losartan   25 mg Oral Daily   memantine   10 mg Oral BID   multivitamin with minerals  1 tablet Oral Daily   pioglitazone   15 mg Oral Daily   rosuvastatin   20 mg Oral Daily   Continuous Infusions:  piperacillin -tazobactam (ZOSYN )  IV       LOS: 1 day  MDM: Patient is high risk for one or more organ failure.  They necessitate ongoing hospitalization for continued IV therapies and subsequent lab monitoring. Total time spent interpreting labs and vitals, reviewing the medical record, coordinating care amongst consultants and care team members, directly assessing and discussing care with the patient and/or family: 55 min Laree Lock, MD [facility]  To contact the attending physician between 7A-7P please use Epic Chat. To contact the covering physician during after hours 7P-7A, please review  Amion.  10/27/2024, 5:35 PM   *This document has been created with the assistance of dictation software. Please excuse typographical errors. *   "

## 2024-10-28 ENCOUNTER — Inpatient Hospital Stay

## 2024-10-28 DIAGNOSIS — G9340 Encephalopathy, unspecified: Secondary | ICD-10-CM | POA: Diagnosis not present

## 2024-10-28 LAB — RESPIRATORY PANEL BY PCR

## 2024-10-28 LAB — GLUCOSE, CAPILLARY
Glucose-Capillary: 121 mg/dL — ABNORMAL HIGH (ref 70–99)
Glucose-Capillary: 99 mg/dL (ref 70–99)
Glucose-Capillary: 194 mg/dL — ABNORMAL HIGH (ref 70–99)
Glucose-Capillary: 137 mg/dL — ABNORMAL HIGH (ref 70–99)

## 2024-10-28 LAB — BASIC METABOLIC PANEL WITH GFR
Anion gap: 14 (ref 5–15)
BUN: 19 mg/dL (ref 8–23)
CO2: 23 mmol/L (ref 22–32)
Calcium: 10 mg/dL (ref 8.9–10.3)
Chloride: 102 mmol/L (ref 98–111)
Creatinine, Ser: 0.85 mg/dL (ref 0.44–1.00)
GFR, Estimated: >60 mL/min
Glucose, Bld: 113 mg/dL — ABNORMAL HIGH (ref 70–99)
Potassium: 3.8 mmol/L (ref 3.5–5.1)
Sodium: 139 mmol/L (ref 135–145)

## 2024-10-28 LAB — CBC
HCT: 35 % — ABNORMAL LOW (ref 36.0–46.0)
Hemoglobin: 11.2 g/dL — ABNORMAL LOW (ref 12.0–15.0)
MCH: 28.7 pg (ref 26.0–34.0)
MCHC: 32 g/dL (ref 30.0–36.0)
MCV: 89.7 fL (ref 80.0–100.0)
Platelets: 162 K/uL (ref 150–400)
RBC: 3.9 MIL/uL (ref 3.87–5.11)
RDW: 13.9 % (ref 11.5–15.5)
WBC: 8.2 K/uL (ref 4.0–10.5)
nRBC: 0 % (ref 0.0–0.2)

## 2024-10-28 NOTE — Progress Notes (Signed)
 " Progress Note   Patient: Lindsey [name] FMW:[id number] DOB: 1951/06/16 DOA: 10/25/2024     2 DOS: the patient was seen and examined on 10/28/2024   Brief hospital course: From HPI Lindsey [name] is a 74 y.o. Caucasian female with medical history significant for gout, anxiety, depression, dementia, type 2 diabetes mellitus, fibromyalgia, dyslipidemia, hypertension, and stage IIIa chronic kidney disease, who presented to the emergency room with onset of altered mental status with increased confusion and generalized weakness with diminished energy and activity level over the last 10 days.  Per daughter patient did not have any focal complaints.  Denies SOB, DOE, chest pain, palpitations, nausea/vomiting, abdominal pain, urinary symptoms.  Limited due to patient's dementia, unclear if she is getting adequate p.o. intake   Patient admitted for acute encephalopathy, possible sepsis vs dehydration, hospital course as below      Assessment and Plan:  Acute encephalopathy - unclear etiology Suspect secondary to infectious etiology vs dehydration vs medication, worsening dementia CT head negative for acute changes On antibiotics as below for possible infectious etiology Continue IV fluids for dehydration Hold off on further imaging MRI, per discussion with daughter Hold Seroquel , sertraline    Suspect Sepsis due to undetermined organism Found to be tachycardic on presentation Lactic acidosis resolved, No leukocytosis COVID/flu/RSV negative, UA negative CT with no evidence of acute infection Continue IV Zosyn    Sinus tachycardia (POA) Unclear etiology, suspect if secondary to sepsis Workup negative for PE On IV fluids for dehydration Pending echo for pericardial effusion, check BNP, TSH On home dose Coreg    Addendum: BNP elevated, discontinue IV fluids CXR   Hypernatremia Resolved s/p IV fluids Monitor BMP pm   Mild bump in creatinine Mild hypercalcemia Improved s/p IV fluids    Normocytic anemia Monitor Hb   Small pericardial effusion on CT Will get Echo   Dementia with behavioral disturbance Anxiety and depression continue Aricept , Namenda  Hold Seroquel , Sertraline  due to encephalopathy   Controlled type 2 diabetes mellitus without complication, without long-term current use of insulin  Hold Home metformin  Continue Jardiance , Actos , SSI   Essential hypertension Continue Losartan , Coreg    Gout continue allopurinol    Right kidney cystic lesion Progressive 2.0 cm complex cystic lesion in the interpolar right kidney, suspicious for cystic renal neoplasm  Was found on prior CT dated 04/[facility]24 at [facility] Daughter reports chose not to do further workup   Incidental findings  mildly progressive pancreatic pseudocysts Stable T12 compression deformity Probable pulmonary hypertension   Generalized weakness AM cortisol normal TSH PT/OT rec SNF, but daughter would like to take her home with home care     DVT prophylaxis: Lovenox  SQ   Code Status: Full Code Disposition:  Home health   Subjective:  Seen and examined at bedside this morning in the presence of the caretaker Mental status still not at baseline Denies nausea or chest pain  Physical Exam: GENERAL: Elderly female laying in bed in no acute distress LUNGS: clear to auscultation bilaterally CARDIOVASCULAR: Regular rate and tachycardic rhythm, S1, S2 normal. No murmurs, rubs, or gallops.  ABDOMEN: Soft, nondistended, nontender. Bowel sounds present. No organomegaly or mass.  EXTREMITIES: No pedal edema, cyanosis, or clubbing.  NEUROLOGIC: AO x1, pleasantly confused, no gross focal deficits PSYCHIATRIC: The patient is alert and disoriented to time and place.  Normal affect and good eye contact. SKIN: No obvious rash, lesion, or ulcer   Data Reviewed:    Latest Ref Rng & Units 10/28/2024    4:24 AM 10/27/2024  5:37 AM 10/26/2024    6:40 AM  CBC  WBC 4.0 - 10.5 K/uL 8.2  8.4  6.8    Hemoglobin 12.0 - 15.0 g/dL 88.7  88.4  89.7   Hematocrit 36.0 - 46.0 % 35.0  35.8  32.2   Platelets 150 - 400 K/uL 162  183  157        Latest Ref Rng & Units 10/28/2024    4:24 AM 10/27/2024    5:37 AM 10/26/2024    6:14 PM  BMP  Glucose 70 - 99 mg/dL 886  865  823   BUN 8 - 23 mg/dL 19  17  16    Creatinine 0.44 - 1.00 mg/dL 9.14  9.14  9.12   Sodium 135 - 145 mmol/L 139  142  140   Potassium 3.5 - 5.1 mmol/L 3.8  3.7  3.6   Chloride 98 - 111 mmol/L 102  104  105   CO2 22 - 32 mmol/L 23  24  25    Calcium  8.9 - 10.3 mg/dL 89.9  89.1  89.8      Vitals:   10/27/24 1956 10/28/24 0033 10/28/24 0328 10/28/24 0816  BP: (!) 159/91 (!) 151/87 (!) 147/81 (!) 159/80  Pulse: (!) 115 (!) 102 (!) 109 (!) 113  Resp: 14 18 17 18   Temp: 98.5 F (36.9 C) 98.3 F (36.8 C) 97.8 F (36.6 C) 98.3 F (36.8 C)  TempSrc:   Oral Oral  SpO2: 97% 92% 99% 94%  Weight:      Height:         Author: Drue ONEIDA Potter, MD 10/28/2024 4:07 PM  For on call review [email or web address]  "

## 2024-10-28 NOTE — Progress Notes (Signed)
 Occupational Therapy Treatment Patient Details Name: Lindsey [name] MRN: [id number] DOB: May 26, 1951 Today's Date: 10/28/2024   History of present illness 74 y.o. Caucasian female with medical history significant for gout, anxiety, depression, dementia, type 2 diabetes mellitus, fibromyalgia, dyslipidemia, hypertension, and stage IIIa chronic kidney disease, who presented to the emergency room with onset of altered mental status with increased confusion and generalized weakness with diminished energy and activity level over the last 10 days.   OT comments  Upon entering the room, pt supine in bed and agreeable to OT intervention with encouragement from therapist and family member in the room. Pt needing mod A for bed mobility. Bed and gown noted to be soiled with urine and linen soiled. Pt stands and transfers to [facility] with max A for clothing management. Pt found to have also begun to have BM. Pt sits on BSC and able to have further BM. She stands with mod A while therapist provides total A for hygiene and to pull clean brief over B hips. Pt then pivots back to bed in same manner as above. RW placed in front of pt and she takes several side steps to the R, sits to rest, stands and take steps back to the left with min - mod A. Pt needing max multimodal cues secondary to cognitive deficits. Pt returning to supine at end of session with all needs within reach and bed alarm activated. Bed linens were changed while pt was up on commode chair as well as pt's hospital gown being changed.       If plan is discharge home, recommend the following:  A little help with walking and/or transfers;A little help with bathing/dressing/bathroom;Assistance with cooking/housework;Direct supervision/assist for medications management;Direct supervision/assist for financial management;Assist for transportation;Help with stairs or ramp for entrance;Supervision due to cognitive status   Equipment Recommendations  None recommended  by OT       Precautions / Restrictions Precautions Precautions: Fall       Mobility Bed Mobility Overal bed mobility: Needs Assistance Bed Mobility: Supine to Sit, Sit to Supine     Supine to sit: Max assist Sit to supine: Max assist        Transfers Overall transfer level: Needs assistance Equipment used: 1 person hand held assist, Rolling walker (2 wheels) Transfers: Sit to/from Stand Sit to Stand: Mod assist     Step pivot transfers: Mod assist           Balance Overall balance assessment: Needs assistance Sitting-balance support: Feet supported Sitting balance-Leahy Scale: Fair     Standing balance support: Single extremity supported, Bilateral upper extremity supported, During functional activity Standing balance-Leahy Scale: Poor                             ADL either performed or assessed with clinical judgement   ADL Overall ADL's : Needs assistance/impaired                 Upper Body Dressing : Sitting;Minimal assistance   Lower Body Dressing: Sit to/from stand;Maximal assistance   Toilet Transfer: Moderate assistance;BSC/3in1;Stand-pivot   Toileting- Clothing Manipulation and Hygiene: Maximal assistance       Functional mobility during ADLs: Rolling walker (2 wheels);Minimal assistance      Extremity/Trunk Assessment Upper Extremity Assessment Upper Extremity Assessment: Generalized weakness   Lower Extremity Assessment Lower Extremity Assessment: Generalized weakness        Vision Patient Visual Report: No change from baseline  Communication Communication Communication: Impaired Factors Affecting Communication: Hearing impaired;Difficulty expressing self   Cognition Arousal: Alert Behavior During Therapy: Restless Cognition: History of cognitive impairments                               Following commands: Impaired Following commands impaired: Follows one step commands with  increased time, Follows one step commands inconsistently      Cueing   Cueing Techniques: Verbal cues, Gestural cues, Tactile cues    Frequency  Min 2X/week        Progress Toward Goals  OT Goals(current goals can now be found in the care plan section)  Progress towards OT goals: Progressing toward goals      AM-PAC OT 6 Clicks Daily Activity     Outcome Measure   Help from another person eating meals?: None Help from another person taking care of personal grooming?: A Little Help from another person toileting, which includes using toliet, bedpan, or urinal?: A Lot Help from another person bathing (including washing, rinsing, drying)?: A Lot Help from another person to put on and taking off regular upper body clothing?: A Little Help from another person to put on and taking off regular lower body clothing?: A Lot 6 Click Score: 16    End of Session Equipment Utilized During Treatment: Rolling walker (2 wheels)  OT Visit Diagnosis: Unsteadiness on feet (R26.81);Repeated falls (R29.6);Muscle weakness (generalized) (M62.81)   Activity Tolerance Patient tolerated treatment well   Patient Left in bed;with call bell/phone within reach;with bed alarm set;with family/visitor present   Nurse Communication Mobility status        Time: 8551-8480 OT Time Calculation (min): 31 min  Charges: OT General Charges $OT Visit: 1 Visit OT Treatments $Self Care/Home Management : 23-37 mins  Izetta Claude, MS, OTR/L , CBIS ascom [phone]  10/28/2024, 3:29 PM

## 2024-10-28 NOTE — Care Management Important Message (Signed)
 Important Message  Patient Details  Name: Lindsey [name] MRN: [id number] Date of Birth: Jul 18, 1951   Important Message Given:  Yes - Medicare IM     Julliette Frentz W, CMA 10/28/2024, 2:43 PM

## 2024-10-29 DIAGNOSIS — G9340 Encephalopathy, unspecified: Secondary | ICD-10-CM | POA: Diagnosis not present

## 2024-10-29 LAB — CBC WITH DIFFERENTIAL/PLATELET
Abs Immature Granulocytes: 0.03 K/uL (ref 0.00–0.07)
Basophils Absolute: 0.1 K/uL (ref 0.0–0.1)
Basophils Relative: 1 %
Eosinophils Absolute: 0.1 K/uL (ref 0.0–0.5)
Eosinophils Relative: 2 %
HCT: 36.6 % (ref 36.0–46.0)
Hemoglobin: 11.6 g/dL — ABNORMAL LOW (ref 12.0–15.0)
Immature Granulocytes: 0 %
Lymphocytes Relative: 23 %
Lymphs Abs: 1.7 K/uL (ref 0.7–4.0)
MCH: 28.5 pg (ref 26.0–34.0)
MCHC: 31.7 g/dL (ref 30.0–36.0)
MCV: 89.9 fL (ref 80.0–100.0)
Monocytes Absolute: 0.6 K/uL (ref 0.1–1.0)
Monocytes Relative: 8 %
Neutro Abs: 5 K/uL (ref 1.7–7.7)
Neutrophils Relative %: 66 %
Platelets: 185 K/uL (ref 150–400)
RBC: 4.07 MIL/uL (ref 3.87–5.11)
RDW: 13.9 % (ref 11.5–15.5)
WBC: 7.4 K/uL (ref 4.0–10.5)
nRBC: 0 % (ref 0.0–0.2)

## 2024-10-29 LAB — GLUCOSE, CAPILLARY
Glucose-Capillary: 130 mg/dL — ABNORMAL HIGH (ref 70–99)
Glucose-Capillary: 221 mg/dL — ABNORMAL HIGH (ref 70–99)
Glucose-Capillary: 217 mg/dL — ABNORMAL HIGH (ref 70–99)
Glucose-Capillary: 147 mg/dL — ABNORMAL HIGH (ref 70–99)

## 2024-10-29 LAB — BASIC METABOLIC PANEL WITH GFR
Anion gap: 16 — ABNORMAL HIGH (ref 5–15)
BUN: 22 mg/dL (ref 8–23)
CO2: 23 mmol/L (ref 22–32)
Calcium: 11 mg/dL — ABNORMAL HIGH (ref 8.9–10.3)
Chloride: 104 mmol/L (ref 98–111)
Creatinine, Ser: 0.86 mg/dL (ref 0.44–1.00)
GFR, Estimated: >60 mL/min
Glucose, Bld: 133 mg/dL — ABNORMAL HIGH (ref 70–99)
Potassium: 3.9 mmol/L (ref 3.5–5.1)
Sodium: 143 mmol/L (ref 135–145)

## 2024-10-29 NOTE — Progress Notes (Signed)
 Physical Therapy Treatment Patient Details Name: Lindsey [name] MRN: [id number] DOB: May 28, 1951 Today's Date: 10/29/2024   History of Present Illness Pt is a 74 y.o. caucasian female with medical history significant for gout, anxiety, depression, dementia, type 2 diabetes mellitus, fibromyalgia, dyslipidemia, hypertension, and stage IIIa chronic kidney disease, who presented to the emergency room with onset of altered mental status with increased confusion and generalized weakness with diminished energy and activity level.  MD assessment includes sepsis, tachycardia, hypernatremia, acute encephalopathy, and generalized weakness.    PT Comments  Pt confused but pleasant and put forth good effort during the session.  Pt required max multi-modal cueing to follow 1-step commands along with physical assistance with all functional tasks per below.  Pt did make good progress towards goals this session, however, and was able to amb 40 feet with +2 HHA and min A for stability and to guide the direction of her walking.  No signs of distress noted during the session with pt anxious at times but able to be reassured with voice.  Pt will benefit from continued PT services upon discharge to safely address deficits listed in patient problem list for decreased caregiver assistance and eventual return to PLOF.     If plan is discharge home, recommend the following: Two people to help with walking and/or transfers;A lot of help with bathing/dressing/bathroom;Assistance with cooking/housework;Supervision due to cognitive status;Direct supervision/assist for medications management;Direct supervision/assist for financial management;Assist for transportation;Help with stairs or ramp for entrance   Can travel by private vehicle     No  Equipment Recommendations  Other (comment) (TBD at next venue of care)    Recommendations for Other Services       Precautions / Restrictions Precautions Precautions: Fall Recall of  Precautions/Restrictions: Impaired Restrictions Weight Bearing Restrictions Per Provider Order: No     Mobility  Bed Mobility Overal bed mobility: Needs Assistance Bed Mobility: Supine to Sit, Sit to Supine Rolling: Mod assist     Sit to supine: Max assist, +2 for physical assistance Sit to sidelying: Max assist, +2 for physical assistance General bed mobility comments: Pt resistive, requires extensive multi-modal cues to follow 1-step commands    Transfers Overall transfer level: Needs assistance Equipment used: 2 person hand held assist Transfers: Sit to/from Stand Sit to Stand: +2 physical assistance, Min assist           General transfer comment: Pt required +2 min A to come to standing along with max multi-modal cues to initiate movement    Ambulation/Gait Ambulation/Gait assistance: Min assist, +2 physical assistance Gait Distance (Feet): 40 Feet Assistive device: 2 person hand held assist Gait Pattern/deviations: Step-through pattern, Decreased step length - right, Decreased step length - left Gait velocity: decreased     General Gait Details: Slow cadence with short bilatal step length and occasional min A for stability   Stairs             Wheelchair Mobility     Tilt Bed    Modified Rankin (Stroke Patients Only)       Balance Overall balance assessment: Needs assistance Sitting-balance support: Feet supported Sitting balance-Leahy Scale: Good     Standing balance support: Bilateral upper extremity supported, During functional activity Standing balance-Leahy Scale: Poor                              Communication Communication Communication: Impaired Factors Affecting Communication: Difficulty expressing self  Cognition Arousal:  Alert Behavior During Therapy: Restless   PT - Cognitive impairments: History of cognitive impairments                         Following commands: Impaired Following commands  impaired: Follows one step commands inconsistently    Cueing Cueing Techniques: Verbal cues, Gestural cues, Tactile cues  Exercises Other Exercises Other Exercises: Static sitting at EOB for improved activity tolerance and core strength    General Comments        Pertinent Vitals/Pain Pain Assessment Pain Assessment: PAINAD Breathing: normal Negative Vocalization: none Facial Expression: smiling or inexpressive Body Language: tense, distressed pacing, fidgeting Consolability: no need to console PAINAD Score: 1 Pain Intervention(s): Monitored during session    Home Living                          Prior Function            PT Goals (current goals can now be found in the care plan section) Progress towards PT goals: Progressing toward goals    Frequency    Min 2X/week      PT Plan      Co-evaluation              AM-PAC PT 6 Clicks Mobility   Outcome Measure  Help needed turning from your back to your side while in a flat bed without using bedrails?: A Lot Help needed moving from lying on your back to sitting on the side of a flat bed without using bedrails?: Total Help needed moving to and from a bed to a chair (including a wheelchair)?: A Lot Help needed standing up from a chair using your arms (e.g., wheelchair or bedside chair)?: A Lot Help needed to walk in hospital room?: A Lot Help needed climbing 3-5 steps with a railing? : Total 6 Click Score: 10    End of Session Equipment Utilized During Treatment: Gait belt Activity Tolerance: Patient tolerated treatment well Patient left: in bed;with call bell/phone within reach;with bed alarm set;with family/visitor present;Other (comment) (safety mittens donned) Nurse Communication: Mobility status PT Visit Diagnosis: Other abnormalities of gait and mobility (R26.89);Unsteadiness on feet (R26.81);Difficulty in walking, not elsewhere classified (R26.2);Muscle weakness (generalized) (M62.81)      Time: 8599-8576 PT Time Calculation (min) (ACUTE ONLY): 23 min  Charges:    $Gait Training: 8-22 mins $Therapeutic Activity: 8-22 mins PT General Charges $$ ACUTE PT VISIT: 1 Visit                    D. Scott Seann Genther PT, DPT 10/29/24, 2:41 PM

## 2024-10-29 NOTE — Progress Notes (Signed)
 Mobility Specialist - Progress Note   10/29/24 1113  Mobility  Activity Stood with assistance;Pivoted/transferred to/from [facility]  Level of Assistance Moderate assist, patient does 50-74%  Location Manager Ambulated (ft) 4 ft  Activity Response Tolerated well  Mobility visit 1 Mobility  Mobility Specialist Start Time (ACUTE ONLY) 1035  Mobility Specialist Stop Time (ACUTE ONLY) 1109  Mobility Specialist Time Calculation (min) (ACUTE ONLY) 34 min   Pt supine upon entry, utilizing RA--- soiled. She completed bed mob w/ MinA, requiring ModA to stand and transfer to/from the [facility]--- multimodal cuing throughout session. MaxA peri care. Pt returned to the bed, left semi fowler with alarm set and needs within reach.  America Silvan Mobility Specialist 10/29/24 11:16 AM

## 2024-10-29 NOTE — Progress Notes (Signed)
 " Progress Note   Patient: Lindsey [name] FMW:[id number] DOB: [birth year] DOA: 10/25/2024     3 DOS: the patient was seen and examined on 10/29/2024     Brief hospital course: From HPI Lindsey [name] is a 74 y.o. Caucasian female with medical history significant for gout, anxiety, depression, dementia, type 2 diabetes mellitus, fibromyalgia, dyslipidemia, hypertension, and stage IIIa chronic kidney disease, who presented to the emergency room with onset of altered mental status with increased confusion and generalized weakness with diminished energy and activity level over the last 10 days.  Per daughter patient did not have any focal complaints.  Denies SOB, DOE, chest pain, palpitations, nausea/vomiting, abdominal pain, urinary symptoms.  Limited due to patient's dementia, unclear if she is getting adequate p.o. intake   Patient admitted for acute encephalopathy, possible sepsis vs dehydration, hospital course as below        Assessment and Plan:   Acute encephalopathy - unclear etiology Suspect secondary to infectious etiology vs dehydration vs medication, worsening dementia CT head negative for acute changes On antibiotics as below for possible infectious etiology Continue IV fluids for dehydration Hold off on further imaging MRI, per discussion with daughter Hold Seroquel , sertraline    Suspect Sepsis due to undetermined organism Found to be tachycardic on presentation Lactic acidosis resolved, No leukocytosis COVID/flu/RSV negative, UA negative CT with no evidence of acute infection Continue IV Zosyn    Sinus tachycardia (POA) Unclear etiology, suspect if secondary to sepsis Workup negative for PE On IV fluids for dehydration Pending echo for pericardial effusion, check BNP, TSH On home dose Coreg    Addendum: BNP elevated, discontinue IV fluids CXR   Hypernatremia Resolved s/p IV fluids Monitor BMP pm   Mild bump in creatinine Mild hypercalcemia Improved s/p IV  fluids   Normocytic anemia Monitor Hb   Small pericardial effusion on CT Echocardiogram showed EF 60 to 65%   Dementia with behavioral disturbance Anxiety and depression continue Aricept , Namenda  Hold Seroquel , Sertraline  due to encephalopathy   Controlled type 2 diabetes mellitus without complication, without long-term current use of insulin  Hold Home metformin  Continue Jardiance , Actos , SSI   Essential hypertension Continue Losartan , Coreg    Gout continue allopurinol    Right kidney cystic lesion Progressive 2.0 cm complex cystic lesion in the interpolar right kidney, suspicious for cystic renal neoplasm  Was found on prior CT dated 04/2024 at [facility] Daughter reports chose not to do further workup   Incidental findings  mildly progressive pancreatic pseudocysts Stable T12 compression deformity Probable pulmonary hypertension   Generalized weakness AM cortisol normal TSH PT/OT rec SNF, but daughter would like to take her home with home care     DVT prophylaxis: Lovenox  SQ   Code Status: Full Code Disposition:  Home health     Subjective:  Seen and examined at bedside this morning Mental status still not at baseline Denies nausea or chest pain   Physical Exam: GENERAL: Elderly female laying in bed in no acute distress LUNGS: clear to auscultation bilaterally CARDIOVASCULAR: Regular rate and tachycardic rhythm, S1, S2 normal. No murmurs, rubs, or gallops.  ABDOMEN: Soft, nondistended, nontender. Bowel sounds present. No organomegaly or mass.  EXTREMITIES: No pedal edema, cyanosis, or clubbing.  NEUROLOGIC: AO x1, pleasantly confused, no gross focal deficits PSYCHIATRIC: The patient is alert and disoriented to time and place.  Normal affect and good eye contact. SKIN: No obvious rash, lesion, or ulcer     Data Reviewed:    Latest Ref Rng & Units 10/29/2024  5:46 AM 10/28/2024    4:24 AM 10/27/2024    5:37 AM  CBC  WBC 4.0 - 10.5 K/uL 7.4  8.2  8.4    Hemoglobin 12.0 - 15.0 g/dL 88.3  88.7  88.4   Hematocrit 36.0 - 46.0 % 36.6  35.0  35.8   Platelets 150 - 400 K/uL 185  162  183        Latest Ref Rng & Units 10/29/2024    5:46 AM 10/28/2024    4:24 AM 10/27/2024    5:37 AM  BMP  Glucose 70 - 99 mg/dL 866  886  865   BUN 8 - 23 mg/dL 22  19  17    Creatinine 0.44 - 1.00 mg/dL 9.13  9.14  9.14   Sodium 135 - 145 mmol/L 143  139  142   Potassium 3.5 - 5.1 mmol/L 3.9  3.8  3.7   Chloride 98 - 111 mmol/L 104  102  104   CO2 22 - 32 mmol/L 23  23  24    Calcium  8.9 - 10.3 mg/dL 88.9  89.9  89.1       Vitals:   10/28/24 2126 10/29/24 0539 10/29/24 0911 10/29/24 1453  BP: (!) 159/80 (!) 158/78 (!) 157/85 129/67  Pulse: (!) 109 (!) 105 (!) 115 100  Resp: 16 16 18 17   Temp: 98.9 F (37.2 C) 97.9 F (36.6 C) 97.7 F (36.5 C) 98.2 F (36.8 C)  TempSrc:   Oral Oral  SpO2: 98%  97% 97%  Weight:      Height:         Author: Drue Lindsey Potter, MD 10/29/2024 5:39 PM  For on call review [email or web address]  "

## 2024-10-30 DIAGNOSIS — G9340 Encephalopathy, unspecified: Secondary | ICD-10-CM | POA: Diagnosis not present

## 2024-10-30 LAB — CBC WITH DIFFERENTIAL/PLATELET
Abs Immature Granulocytes: 0.02 K/uL (ref 0.00–0.07)
Basophils Absolute: 0.1 K/uL (ref 0.0–0.1)
Basophils Relative: 1 %
Eosinophils Absolute: 0.1 K/uL (ref 0.0–0.5)
Eosinophils Relative: 2 %
HCT: 33.7 % — ABNORMAL LOW (ref 36.0–46.0)
Hemoglobin: 10.9 g/dL — ABNORMAL LOW (ref 12.0–15.0)
Immature Granulocytes: 0 %
Lymphocytes Relative: 27 %
Lymphs Abs: 1.6 K/uL (ref 0.7–4.0)
MCH: 29 pg (ref 26.0–34.0)
MCHC: 32.3 g/dL (ref 30.0–36.0)
MCV: 89.6 fL (ref 80.0–100.0)
Monocytes Absolute: 0.6 K/uL (ref 0.1–1.0)
Monocytes Relative: 10 %
Neutro Abs: 3.6 K/uL (ref 1.7–7.7)
Neutrophils Relative %: 60 %
Platelets: 172 K/uL (ref 150–400)
RBC: 3.76 MIL/uL — ABNORMAL LOW (ref 3.87–5.11)
RDW: 14 % (ref 11.5–15.5)
WBC: 5.9 K/uL (ref 4.0–10.5)
nRBC: 0 % (ref 0.0–0.2)

## 2024-10-30 LAB — BASIC METABOLIC PANEL WITH GFR
Anion gap: 14 (ref 5–15)
BUN: 22 mg/dL (ref 8–23)
CO2: 24 mmol/L (ref 22–32)
Calcium: 10.1 mg/dL (ref 8.9–10.3)
Chloride: 108 mmol/L (ref 98–111)
Creatinine, Ser: 0.91 mg/dL (ref 0.44–1.00)
GFR, Estimated: >60 mL/min
Glucose, Bld: 150 mg/dL — ABNORMAL HIGH (ref 70–99)
Potassium: 3.3 mmol/L — ABNORMAL LOW (ref 3.5–5.1)
Sodium: 146 mmol/L — ABNORMAL HIGH (ref 135–145)

## 2024-10-30 LAB — CULTURE, BLOOD (ROUTINE X 2)
Culture: NO GROWTH
Culture: NO GROWTH

## 2024-10-30 LAB — GLUCOSE, CAPILLARY
Glucose-Capillary: 148 mg/dL — ABNORMAL HIGH (ref 70–99)
Glucose-Capillary: 172 mg/dL — ABNORMAL HIGH (ref 70–99)
Glucose-Capillary: 120 mg/dL — ABNORMAL HIGH (ref 70–99)
Glucose-Capillary: 140 mg/dL — ABNORMAL HIGH (ref 70–99)

## 2024-10-30 MED ORDER — ZIPRASIDONE MESYLATE 20 MG IM SOLR
10.0000 mg | Freq: Four times a day (QID) | INTRAMUSCULAR | Status: DC | PRN
  Filled 2024-10-30 (×2): qty 20

## 2024-10-30 MED ORDER — SODIUM CHLORIDE 0.9 % IV SOLN: INTRAVENOUS | Status: AC

## 2024-10-30 MED ORDER — POTASSIUM CHLORIDE 20 MEQ PO PACK
40.0000 meq | PACK | Freq: Once | ORAL | Status: AC
  Administered 2024-10-30: 40 meq via ORAL
  Filled 2024-10-30: qty 2

## 2024-10-30 NOTE — TOC Progression Note (Signed)
 Transition of Care [facility]) - Progression Note    Patient Details  Name: Lindsey [name] MRN: [id number] Date of Birth: Oct 16, 1950  Transition of Care [facility] CM/SW Contact  Corean ONEIDA Haddock, RN Phone Number: 10/30/2024, 2:37 PM  Clinical Narrative:      Plan remains to dc home with [facility] when medically ready                    Expected Discharge Plan and Services                                               Social Drivers of Health (SDOH) Interventions SDOH Screenings   Food Insecurity: Patient Unable To Answer (10/26/2024)  Housing: Unknown (10/26/2024)  Transportation Needs: Patient Unable To Answer (10/26/2024)  Utilities: Patient Unable To Answer (10/26/2024)  Alcohol Screen: Low Risk (08/22/2022)  Depression (PHQ2-9): Low Risk (10/23/2024)  Financial Resource Strain: Low Risk (09/04/2024)  Physical Activity: Insufficiently Active (10/23/2024)  Social Connections: Patient Unable To Answer (10/26/2024)  Recent Concern: Social Connections - Socially Isolated (10/23/2024)  Stress: No Stress Concern Present (10/23/2024)  Tobacco Use: Low Risk (10/25/2024)  Health Literacy: Adequate Health Literacy (10/23/2024)    Readmission Risk Interventions     No data to display

## 2024-10-30 NOTE — Progress Notes (Signed)
 Physical Therapy Treatment Patient Details Name: Lindsey [name] MRN: [id number] DOB: 1951-02-12 Today's Date: 10/30/2024   History of Present Illness Pt is a 74 y.o. female with medical history significant for gout, anxiety, depression, dementia, type 2 diabetes mellitus, fibromyalgia, dyslipidemia, hypertension, and stage IIIa chronic kidney disease, who presented to the emergency room with onset of altered mental status with increased confusion and generalized weakness with diminished energy and activity level.  MD assessment includes sepsis, tachycardia, hypernatremia, acute encephalopathy, and generalized weakness.    PT Comments  Pt was pleasantly confused but participated during the session with frequent multi-modal cuing for sequencing and to initiate movement.   Pt required physical assistance with bed mobility tasks and transfers but once in standing was able to amb this session with +2 HHA but no physical assistance required to prevent LOB.  Pt reported no adverse symptoms during the session and was left in bed requesting to take a nap with family present.  Pt will benefit from continued PT services upon discharge to safely address deficits listed in patient problem list for decreased caregiver assistance and eventual return to PLOF.      If plan is discharge home, recommend the following: Two people to help with walking and/or transfers;A lot of help with bathing/dressing/bathroom;Assistance with cooking/housework;Supervision due to cognitive status;Direct supervision/assist for medications management;Direct supervision/assist for financial management;Assist for transportation;Help with stairs or ramp for entrance   Can travel by private vehicle     No  Equipment Recommendations  Other (comment) (TBD at next venue of care)    Recommendations for Other Services       Precautions / Restrictions Precautions Precautions: Fall Recall of Precautions/Restrictions:  Impaired Restrictions Weight Bearing Restrictions Per Provider Order: No     Mobility  Bed Mobility Overal bed mobility: Needs Assistance Bed Mobility: Supine to Sit, Sit to Supine     Supine to sit: Max assist, +2 for physical assistance Sit to supine: Max assist, +2 for physical assistance   General bed mobility comments: +2 max A for BLE and trunk control    Transfers Overall transfer level: Needs assistance Equipment used: 2 person hand held assist Transfers: Sit to/from Stand Sit to Stand: +2 physical assistance, Min assist           General transfer comment: Pt required +2 min A to come to standing along with max multi-modal cues to initiate movement    Ambulation/Gait Ambulation/Gait assistance: Contact guard assist, +2 physical assistance Gait Distance (Feet): 50 Feet Assistive device: 2 person hand held assist Gait Pattern/deviations: Step-through pattern, Decreased step length - right, Decreased step length - left Gait velocity: decreased     General Gait Details: Slow cadence with short bilatal step length and min to mod lean on +2 HHA for support but no overt LOB   Stairs             Wheelchair Mobility     Tilt Bed    Modified Rankin (Stroke Patients Only)       Balance Overall balance assessment: Needs assistance Sitting-balance support: Feet supported Sitting balance-Leahy Scale: Good     Standing balance support: Bilateral upper extremity supported, During functional activity, Reliant on assistive device for balance Standing balance-Leahy Scale: Fair                              Communication Communication Communication: Impaired Factors Affecting Communication: Difficulty expressing self  Cognition Arousal: Alert  Behavior During Therapy: WFL for tasks assessed/performed   PT - Cognitive impairments: History of cognitive impairments                         Following commands: Impaired Following  commands impaired: Follows one step commands inconsistently    Cueing Cueing Techniques: Verbal cues, Tactile cues  Exercises Other Exercises Other Exercises: Static unsupported sitting at the EOB for improved activity tolerance and core strength    General Comments        Pertinent Vitals/Pain Pain Assessment Pain Assessment: PAINAD Breathing: normal Negative Vocalization: none Facial Expression: smiling or inexpressive Body Language: relaxed Consolability: no need to console PAINAD Score: 0 Pain Intervention(s): Monitored during session    Home Living                          Prior Function            PT Goals (current goals can now be found in the care plan section) Progress towards PT goals: Progressing toward goals    Frequency    Min 2X/week      PT Plan      Co-evaluation              AM-PAC PT 6 Clicks Mobility   Outcome Measure  Help needed turning from your back to your side while in a flat bed without using bedrails?: A Lot Help needed moving from lying on your back to sitting on the side of a flat bed without using bedrails?: Total Help needed moving to and from a bed to a chair (including a wheelchair)?: A Lot Help needed standing up from a chair using your arms (e.g., wheelchair or bedside chair)?: A Lot Help needed to walk in hospital room?: A Lot Help needed climbing 3-5 steps with a railing? : A Lot 6 Click Score: 11    End of Session Equipment Utilized During Treatment: Gait belt Activity Tolerance: Patient tolerated treatment well Patient left: in bed;with call bell/phone within reach;with bed alarm set;with family/visitor present;Other (comment) (safety mittens donned) Nurse Communication: Mobility status PT Visit Diagnosis: Other abnormalities of gait and mobility (R26.89);Unsteadiness on feet (R26.81);Difficulty in walking, not elsewhere classified (R26.2);Muscle weakness (generalized) (M62.81)     Time:  8598-8577 PT Time Calculation (min) (ACUTE ONLY): 21 min  Charges:    $Gait Training: 8-22 mins PT General Charges $$ ACUTE PT VISIT: 1 Visit                     D. Scott Addysin Porco PT, DPT 10/30/24, 2:39 PM

## 2024-10-30 NOTE — Progress Notes (Signed)
 " Progress Note   Patient: Lindsey [name] FMW:[id number] DOB: [birth year] DOA: 10/25/2024     4 DOS: the patient was seen and examined on 10/30/2024    Brief hospital course: From HPI Lindsey [name] is a 74 y.o. Caucasian female with medical history significant for gout, anxiety, depression, dementia, type 2 diabetes mellitus, fibromyalgia, dyslipidemia, hypertension, and stage IIIa chronic kidney disease, who presented to the emergency room with onset of altered mental status with increased confusion and generalized weakness with diminished energy and activity level over the last 10 days.  Per daughter patient did not have any focal complaints.  Denies SOB, DOE, chest pain, palpitations, nausea/vomiting, abdominal pain, urinary symptoms.  Limited due to patient's dementia, unclear if she is getting adequate p.o. intake   Patient admitted for acute encephalopathy, possible sepsis vs dehydration, hospital course as below        Assessment and Plan:   Acute encephalopathy - unclear etiology Suspect secondary to infectious etiology vs dehydration vs medication, worsening dementia CT head negative for acute changes On antibiotics as below for possible infectious etiology Requiring IV fluid Hold off on further imaging MRI, per discussion with daughter Added Seroquel  Continue sertraline    Suspect Sepsis due to undetermined organism Found to be tachycardic on presentation Lactic acidosis resolved, No leukocytosis COVID/flu/RSV negative, UA negative CT with no evidence of acute infection Continue IV Zosyn    Sinus tachycardia (POA) Unclear etiology, suspect if secondary to sepsis Workup negative for PE On IV fluids for dehydration Pending echo for pericardial effusion, check BNP, TSH On home dose Coreg    Addendum: BNP elevated, discontinue IV fluids CXR   Hypernatremia Resolved s/p IV fluids Monitor BMP pm   Mild bump in creatinine Mild hypercalcemia Improved s/p IV fluids    Normocytic anemia Monitor Hb   Small pericardial effusion on CT Echocardiogram showed EF 60 to 65%   Dementia with behavioral disturbance Anxiety and depression continue Aricept , Namenda  Hold Seroquel , Sertraline  due to encephalopathy   Controlled type 2 diabetes mellitus without complication, without long-term current use of insulin  Hold Home metformin  Continue Jardiance , Actos , SSI   Essential hypertension Continue Losartan , Coreg    Gout continue allopurinol    Right kidney cystic lesion Progressive 2.0 cm complex cystic lesion in the interpolar right kidney, suspicious for cystic renal neoplasm  Was found on prior CT dated [facility] at [facility] Daughter reports chose not to do further workup   Incidental findings  mildly progressive pancreatic pseudocysts Stable T12 compression deformity Probable pulmonary hypertension   Generalized weakness AM cortisol normal TSH PT/OT rec SNF, but daughter would like to take her home with home care     DVT prophylaxis: Lovenox  SQ   Code Status: Full Code Disposition:  Home health     Subjective:  Seen and examined at bedside this morning Denies nausea vomiting Did experience some agitation today requiring as needed medication   Physical Exam: GENERAL: Elderly female laying in bed in no acute distress LUNGS: clear to auscultation bilaterally CARDIOVASCULAR: Regular rate and tachycardic rhythm, S1, S2 normal. No murmurs, rubs, or gallops.  ABDOMEN: Soft, nondistended, nontender. Bowel sounds present. No organomegaly or mass.  EXTREMITIES: No pedal edema, cyanosis, or clubbing.  NEUROLOGIC: AO x1, pleasantly confused, no gross focal deficits PSYCHIATRIC: The patient is alert and disoriented to time and place.  Normal affect and good eye contact. SKIN: No obvious rash, lesion, or ulcer     Data Reviewed:  Vitals:   10/29/24 1453 10/29/24 2024 10/30/24 0602  10/30/24 1600  BP: 129/67 (!) 160/70 117/66 (!) 154/77  Pulse: 100 (!)  108 (!) 107 (!) 101  Resp: 17 17 17 17   Temp: 98.2 F (36.8 C) 98.5 F (36.9 C) 98.5 F (36.9 C) 98.3 F (36.8 C)  TempSrc: Oral Oral Axillary   SpO2: 97% 98% 98% 100%  Weight:      Height:          Latest Ref Rng & Units 10/30/2024    5:23 AM 10/29/2024    5:46 AM 10/28/2024    4:24 AM  CBC  WBC 4.0 - 10.5 K/uL 5.9  7.4  8.2   Hemoglobin 12.0 - 15.0 g/dL 89.0  88.3  88.7   Hematocrit 36.0 - 46.0 % 33.7  36.6  35.0   Platelets 150 - 400 K/uL 172  185  162      Author: Drue ONEIDA Potter, MD 10/30/2024 5:40 PM  For on call review [email or web address]  "

## 2024-10-31 DIAGNOSIS — G9340 Encephalopathy, unspecified: Secondary | ICD-10-CM | POA: Diagnosis not present

## 2024-10-31 LAB — GLUCOSE, CAPILLARY
Glucose-Capillary: 102 mg/dL — ABNORMAL HIGH (ref 70–99)
Glucose-Capillary: 224 mg/dL — ABNORMAL HIGH (ref 70–99)
Glucose-Capillary: 175 mg/dL — ABNORMAL HIGH (ref 70–99)
Glucose-Capillary: 157 mg/dL — ABNORMAL HIGH (ref 70–99)

## 2024-10-31 NOTE — Progress Notes (Signed)
 Verbal order obtained by Dr Dorinda to discontinue telemetry.  CCMD notified.

## 2024-10-31 NOTE — TOC Progression Note (Addendum)
 Transition of Care [facility] - Progression Note    Patient Details  Name: Lindsey [name] MRN: [id number] Date of Birth: 20-Sep-1951  Transition of Care [facility]) CM/SW Contact  Victory Jackquline RAMAN, RN Phone Number: 10/31/2024, 12:55 PM  Clinical Narrative:     RNCM received a message from the bedside nurse via secure chat that the patient's daughter would like to speak to me about mother possibly going to STR. RNCM called patient's room and hospital staff answered the phone stating that the daughter left. RNCM called Rosaline @ [phone] and left a message for her to call me back Awaiting call back. RNCM will continue to follow for discharge planning needs.   1505: RNCM spoke to the patient's daughter Rosaline @ [phone], introduced myself and my role and explained that discharge planning would be discussed. She said that she would like to change her decision from taking her mom home with HH/PT and have her go for STR. She is not familiar with the facilities in the area and would to have referral's submitted to facilities in the area. Referral's submitted and will review bed offers once received. RNCM faxed additional documentation to  Must @ [phone] for pending PASRR.                    Expected Discharge Plan and Services                                               Social Drivers of Health (SDOH) Interventions SDOH Screenings   Food Insecurity: Patient Unable To Answer (10/26/2024)  Housing: Unknown (10/26/2024)  Transportation Needs: Patient Unable To Answer (10/26/2024)  Utilities: Patient Unable To Answer (10/26/2024)  Alcohol Screen: Low Risk (08/22/2022)  Depression (PHQ2-9): Low Risk (10/23/2024)  Financial Resource Strain: Low Risk (09/04/2024)  Physical Activity: Insufficiently Active (10/23/2024)  Social Connections: Patient Unable To Answer (10/26/2024)  Recent Concern: Social Connections - Socially Isolated (10/23/2024)  Stress: No Stress Concern Present  (10/23/2024)  Tobacco Use: Low Risk (10/25/2024)  Health Literacy: Adequate Health Literacy (10/23/2024)    Readmission Risk Interventions     No data to display

## 2024-10-31 NOTE — Progress Notes (Signed)
 " Progress Note   Patient: Lindsey [name] FMW:[id number] DOB: 05-31-1951 DOA: 10/25/2024     5 DOS: the patient was seen and examined on 10/31/2024     Brief hospital course: From HPI Lindsey [name] is a 74 y.o. Caucasian female with medical history significant for gout, anxiety, depression, dementia, type 2 diabetes mellitus, fibromyalgia, dyslipidemia, hypertension, and stage IIIa chronic kidney disease, who presented to the emergency room with onset of altered mental status with increased confusion and generalized weakness with diminished energy and activity level over the last 10 days.  Per daughter patient did not have any focal complaints.  Denies SOB, DOE, chest pain, palpitations, nausea/vomiting, abdominal pain, urinary symptoms.  Limited due to patient's dementia, unclear if she is getting adequate p.o. intake   Patient admitted for acute encephalopathy, possible sepsis vs dehydration, hospital course as below        Assessment and Plan:   Acute encephalopathy - unclear etiology Suspect secondary to infectious etiology vs dehydration vs medication, worsening dementia CT head negative for acute changes On antibiotics as below for possible infectious etiology Requiring IV fluid Hold off on further imaging MRI, per discussion with daughter Added Seroquel  Continue sertraline    Suspect Sepsis due to undetermined organism Found to be tachycardic on presentation Lactic acidosis resolved, No leukocytosis COVID/flu/RSV negative, UA negative CT with no evidence of acute infection Continue IV Zosyn    Sinus tachycardia (POA) Unclear etiology, suspect if secondary to sepsis Workup negative for PE On IV fluids for dehydration Pending echo for pericardial effusion, check BNP, TSH On home dose Coreg    Addendum: BNP elevated, discontinue IV fluids CXR   Hypernatremia Resolved s/p IV fluids Monitor BMP pm   Mild bump in creatinine Mild hypercalcemia Improved s/p IV fluids    Normocytic anemia Monitor Hb   Small pericardial effusion on CT Echocardiogram showed EF 60 to 65%   Dementia with behavioral disturbance Anxiety and depression continue Aricept , Namenda  Hold Seroquel , Sertraline  due to encephalopathy   Controlled type 2 diabetes mellitus without complication, without long-term current use of insulin  Hold Home metformin  Continue Jardiance , Actos , SSI   Essential hypertension Continue Losartan , Coreg    Gout continue allopurinol    Right kidney cystic lesion Progressive 2.0 cm complex cystic lesion in the interpolar right kidney, suspicious for cystic renal neoplasm  Was found on prior CT dated 04/[facility]24 at [facility] Daughter reports chose not to do further workup   Incidental findings  mildly progressive pancreatic pseudocysts Stable T12 compression deformity Probable pulmonary hypertension   Generalized weakness AM cortisol normal TSH PT/OT rec SNF, but daughter would like to take her home with home care     DVT prophylaxis: Lovenox  SQ   Code Status: Full Code Disposition:  Home health     Subjective:  Seen and examined at bedside this morning Denies nausea vomiting Agitation improved   Physical Exam: GENERAL: Elderly female laying in bed in no acute distress LUNGS: clear to auscultation bilaterally CARDIOVASCULAR: Regular rate and tachycardic rhythm, S1, S2 normal. No murmurs, rubs, or gallops.  ABDOMEN: Soft, nondistended, nontender. Bowel sounds present. No organomegaly or mass.  EXTREMITIES: No pedal edema, cyanosis, or clubbing.  NEUROLOGIC: AO x1, pleasantly confused, no gross focal deficits PSYCHIATRIC: The patient is alert and disoriented to time and place.  Normal affect and good eye contact. SKIN: No obvious rash, lesion, or ulcer     Data Reviewed:  Vitals:   10/30/24 1600 10/30/24 2107 10/31/24 0539 10/31/24 0855  BP: (!) 154/77 ROLLEN)  154/81 (!) 135/119 (!) 162/82  Pulse: (!) 101 96 88 94  Resp: 17 18 18 18   Temp:  98.3 F (36.8 C) 98 F (36.7 C) (!) 97.5 F (36.4 C) 99.1 F (37.3 C)  TempSrc:      SpO2: 100% 99% 100% 100%  Weight:      Height:          Latest Ref Rng & Units 10/30/2024    5:23 AM 10/29/2024    5:46 AM 10/28/2024    4:24 AM  CBC  WBC 4.0 - 10.5 K/uL 5.9  7.4  8.2   Hemoglobin 12.0 - 15.0 g/dL 89.0  88.3  88.7   Hematocrit 36.0 - 46.0 % 33.7  36.6  35.0   Platelets 150 - 400 K/uL 172  185  162        Latest Ref Rng & Units 10/30/2024    5:23 AM 10/29/2024    5:46 AM 10/28/2024    4:24 AM  BMP  Glucose 70 - 99 mg/dL 849  866  886   BUN 8 - 23 mg/dL 22  22  19    Creatinine 0.44 - 1.00 mg/dL 9.08  9.13  9.14   Sodium 135 - 145 mmol/L 146  143  139   Potassium 3.5 - 5.1 mmol/L 3.3  3.9  3.8   Chloride 98 - 111 mmol/L 108  104  102   CO2 22 - 32 mmol/L 24  23  23    Calcium  8.9 - 10.3 mg/dL 89.8  88.9  89.9      Author: Drue ONEIDA Potter, MD 10/31/2024 2:26 PM  For on call review [email or web address]  "

## 2024-11-01 DIAGNOSIS — G9340 Encephalopathy, unspecified: Secondary | ICD-10-CM | POA: Diagnosis not present

## 2024-11-01 LAB — GLUCOSE, CAPILLARY
Glucose-Capillary: 122 mg/dL — ABNORMAL HIGH (ref 70–99)
Glucose-Capillary: 141 mg/dL — ABNORMAL HIGH (ref 70–99)
Glucose-Capillary: 151 mg/dL — ABNORMAL HIGH (ref 70–99)
Glucose-Capillary: 202 mg/dL — ABNORMAL HIGH (ref 70–99)

## 2024-11-01 NOTE — Plan of Care (Signed)
  Problem: Pain Managment: Goal: General experience of comfort will improve and/or be controlled Outcome: Progressing   Problem: Safety: Goal: Ability to remain free from injury will improve Outcome: Progressing   Problem: Skin Integrity: Goal: Risk for impaired skin integrity will decrease Outcome: Progressing

## 2024-11-01 NOTE — Progress Notes (Signed)
 " Progress Note   Patient: Lindsey [name] FMW:[id number] DOB: [birth year] DOA: 10/25/2024     6 DOS: the patient was seen and examined on 11/01/2024     Brief hospital course: From HPI Lindsey [name] is a 74 y.o. Caucasian female with medical history significant for gout, anxiety, depression, dementia, type 2 diabetes mellitus, fibromyalgia, dyslipidemia, hypertension, and stage IIIa chronic kidney disease, who presented to the emergency room with onset of altered mental status with increased confusion and generalized weakness with diminished energy and activity level over the last 10 days.  Per daughter patient did not have any focal complaints.  Denies SOB, DOE, chest pain, palpitations, nausea/vomiting, abdominal pain, urinary symptoms.  Limited due to patient's dementia, unclear if she is getting adequate p.o. intake   Patient admitted for acute encephalopathy, possible sepsis vs dehydration, hospital course as below        Assessment and Plan:   Acute encephalopathy - unclear etiology Suspect secondary to infectious etiology vs dehydration vs medication, worsening dementia CT head negative for acute changes On antibiotics as below for possible infectious etiology Patient received IV fluid Hold off on further imaging MRI, per discussion with daughter Added Seroquel  Continue sertraline    Suspect Sepsis due to undetermined organism Found to be tachycardic on presentation Lactic acidosis resolved, No leukocytosis COVID/flu/RSV negative, UA negative CT with no evidence of acute infection Continue IV Zosyn    Sinus tachycardia (POA) Unclear etiology, suspect if secondary to sepsis Workup negative for PE On IV fluids for dehydration Pending echo for pericardial effusion, check BNP, TSH On home dose Coreg    Addendum: BNP elevated, discontinue IV fluids CXR   Hypernatremia Resolved s/p IV fluids Monitor BMP pm   Mild bump in creatinine Mild hypercalcemia Improved s/p IV  fluids   Normocytic anemia Monitor Hb   Small pericardial effusion on CT Echocardiogram showed EF 60 to 65%   Dementia with behavioral disturbance Anxiety and depression continue Aricept , Namenda  Hold Seroquel , Sertraline  due to encephalopathy   Controlled type 2 diabetes mellitus without complication, without long-term current use of insulin  Hold Home metformin  Continue Jardiance , Actos , SSI   Essential hypertension Continue Losartan , Coreg    Gout continue allopurinol    Right kidney cystic lesion Progressive 2.0 cm complex cystic lesion in the interpolar right kidney, suspicious for cystic renal neoplasm  Was found on prior CT dated 04/20[facility] at [facility] Daughter reports chose not to do further workup   Incidental findings  mildly progressive pancreatic pseudocysts Stable T12 compression deformity Probable pulmonary hypertension   Generalized weakness AM cortisol normal TSH PT/OT rec SNF, but daughter would like to take her home with home care     DVT prophylaxis: Lovenox  SQ   Code Status: Full Code Disposition: SNF, awaiting placement     Subjective:  Seen and examined at bedside this morning No acute overnight event   Physical Exam: GENERAL: Elderly female laying in bed in no acute distress LUNGS: clear to auscultation bilaterally CARDIOVASCULAR: Regular rate and tachycardic rhythm, S1, S2 normal. No murmurs, rubs, or gallops.  ABDOMEN: Soft, nondistended, nontender. Bowel sounds present. No organomegaly or mass.  EXTREMITIES: No pedal edema, cyanosis, or clubbing.  NEUROLOGIC: AO x1, pleasantly confused, no gross focal deficits PSYCHIATRIC: The patient is alert and disoriented to time and place.  Normal affect and good eye contact. SKIN: No obvious rash, lesion, or ulcer     Data Reviewed:    Latest Ref Rng & Units 10/30/2024    5:23 AM 10/29/2024  5:46 AM 10/28/2024    4:24 AM  CBC  WBC 4.0 - 10.5 K/uL 5.9  7.4  8.2   Hemoglobin 12.0 - 15.0 g/dL 89.0   88.3  88.7   Hematocrit 36.0 - 46.0 % 33.7  36.6  35.0   Platelets 150 - 400 K/uL 172  185  162       Latest Ref Rng & Units 10/30/2024    5:23 AM 10/29/2024    5:46 AM 10/28/2024    4:24 AM  BMP  Glucose 70 - 99 mg/dL 849  866  886   BUN 8 - 23 mg/dL 22  22  19    Creatinine 0.44 - 1.00 mg/dL 9.08  9.13  9.14   Sodium 135 - 145 mmol/L 146  143  139   Potassium 3.5 - 5.1 mmol/L 3.3  3.9  3.8   Chloride 98 - 111 mmol/L 108  104  102   CO2 22 - 32 mmol/L 24  23  23    Calcium  8.9 - 10.3 mg/dL 89.8  88.9  89.9      Vitals:   10/31/24 2110 11/01/24 0425 11/01/24 0426 11/01/24 0829  BP: (!) 144/89 (!) 153/86 (!) 153/86 (!) 147/84  Pulse: 92 92 88 92  Resp: 18 18 18 17   Temp: 99.3 F (37.4 C) 98.9 F (37.2 C) 98.9 F (37.2 C) 98 F (36.7 C)  TempSrc:      SpO2: 98% 100% 100% 99%  Weight:      Height:         Author: Drue ONEIDA Potter, MD 11/01/2024 3:42 PM  For on call review [email or web address]  "

## 2024-11-02 DIAGNOSIS — G9340 Encephalopathy, unspecified: Secondary | ICD-10-CM | POA: Diagnosis not present

## 2024-11-02 LAB — CBC WITH DIFFERENTIAL/PLATELET
Abs Immature Granulocytes: 0.02 K/uL (ref 0.00–0.07)
Basophils Absolute: 0.1 K/uL (ref 0.0–0.1)
Basophils Relative: 1 %
Eosinophils Absolute: 0.2 K/uL (ref 0.0–0.5)
Eosinophils Relative: 3 %
HCT: 33.5 % — ABNORMAL LOW (ref 36.0–46.0)
Hemoglobin: 10.6 g/dL — ABNORMAL LOW (ref 12.0–15.0)
Immature Granulocytes: 0 %
Lymphocytes Relative: 31 %
Lymphs Abs: 1.7 K/uL (ref 0.7–4.0)
MCH: 28.2 pg (ref 26.0–34.0)
MCHC: 31.6 g/dL (ref 30.0–36.0)
MCV: 89.1 fL (ref 80.0–100.0)
Monocytes Absolute: 0.4 K/uL (ref 0.1–1.0)
Monocytes Relative: 7 %
Neutro Abs: 3.1 K/uL (ref 1.7–7.7)
Neutrophils Relative %: 58 %
Platelets: 196 K/uL (ref 150–400)
RBC: 3.76 MIL/uL — ABNORMAL LOW (ref 3.87–5.11)
RDW: 14 % (ref 11.5–15.5)
WBC: 5.5 K/uL (ref 4.0–10.5)
nRBC: 0 % (ref 0.0–0.2)

## 2024-11-02 LAB — GLUCOSE, CAPILLARY
Glucose-Capillary: 129 mg/dL — ABNORMAL HIGH (ref 70–99)
Glucose-Capillary: 142 mg/dL — ABNORMAL HIGH (ref 70–99)
Glucose-Capillary: 133 mg/dL — ABNORMAL HIGH (ref 70–99)
Glucose-Capillary: 130 mg/dL — ABNORMAL HIGH (ref 70–99)

## 2024-11-02 LAB — BASIC METABOLIC PANEL WITH GFR
Anion gap: 11 (ref 5–15)
BUN: 15 mg/dL (ref 8–23)
CO2: 24 mmol/L (ref 22–32)
Calcium: 10.5 mg/dL — ABNORMAL HIGH (ref 8.9–10.3)
Chloride: 108 mmol/L (ref 98–111)
Creatinine, Ser: 0.88 mg/dL (ref 0.44–1.00)
GFR, Estimated: >60 mL/min
Glucose, Bld: 141 mg/dL — ABNORMAL HIGH (ref 70–99)
Potassium: 3.6 mmol/L (ref 3.5–5.1)
Sodium: 143 mmol/L (ref 135–145)

## 2024-11-02 NOTE — Progress Notes (Signed)
 " Progress Note   Patient: Lindsey [name] FMW:[id number] DOB: 12-19-1950 DOA: 10/25/2024     7 DOS: the patient was seen and examined on 11/02/2024     Brief hospital course: From HPI Lindsey [name] is a 74 y.o. Caucasian female with medical history significant for gout, anxiety, depression, dementia, type 2 diabetes mellitus, fibromyalgia, dyslipidemia, hypertension, and stage IIIa chronic kidney disease, who presented to the emergency room with onset of altered mental status with increased confusion and generalized weakness with diminished energy and activity level over the last 10 days.  Per daughter patient did not have any focal complaints.  Denies SOB, DOE, chest pain, palpitations, nausea/vomiting, abdominal pain, urinary symptoms.  Limited due to patient's dementia, unclear if she is getting adequate p.o. intake   Patient admitted for acute encephalopathy, possible sepsis vs dehydration, hospital course as below        Assessment and Plan:   Acute encephalopathy - unclear etiology Suspect secondary to infectious etiology vs dehydration vs medication, worsening dementia CT head negative for acute changes Completed antibiotic therapy Hold off on further imaging MRI, per discussion with daughter Added Seroquel  Continue sertraline    Suspect Sepsis due to undetermined organism Found to be tachycardic on presentation Lactic acidosis resolved, No leukocytosis COVID/flu/RSV negative, UA negative CT with no evidence of acute infection Continue IV Zosyn    Sinus tachycardia (POA) Unclear etiology, suspect if secondary to sepsis Workup negative for PE On IV fluids for dehydration Pending echo for pericardial effusion, check BNP, TSH On home dose Coreg    Addendum: BNP elevated, discontinue IV fluids CXR   Hypernatremia Resolved s/p IV fluids Monitor BMP pm   Mild bump in creatinine Mild hypercalcemia Improved s/p IV fluids   Normocytic anemia Monitor Hb   Small  pericardial effusion on CT Echocardiogram showed EF 60 to 65%   Dementia with behavioral disturbance Anxiety and depression continue Aricept , Namenda  Hold Seroquel , Sertraline  due to encephalopathy   Controlled type 2 diabetes mellitus without complication, without long-term current use of insulin  Hold Home metformin  Continue Jardiance , Actos , SSI   Essential hypertension Continue Losartan , Coreg    Gout continue allopurinol    Right kidney cystic lesion Progressive 2.0 cm complex cystic lesion in the interpolar right kidney, suspicious for cystic renal neoplasm  Was found on prior CT dated 04/2024 at [facility] Daughter reports chose not to do further workup   Incidental findings  mildly progressive pancreatic pseudocysts Stable T12 compression deformity Probable pulmonary hypertension   Generalized weakness AM cortisol normal TSH Patient daughters have agreed for placement   DVT prophylaxis: Lovenox  SQ   Code Status: Full Code Disposition: SNF, awaiting placement     Subjective:  Seen and examined at bedside this morning No acute overnight event TOC working on placement   Physical Exam: GENERAL: Elderly female laying in bed in no acute distress LUNGS: clear to auscultation bilaterally CARDIOVASCULAR: Regular rate and tachycardic rhythm, S1, S2 normal. No murmurs, rubs, or gallops.  ABDOMEN: Soft, nondistended, nontender. Bowel sounds present. No organomegaly or mass.  EXTREMITIES: No pedal edema, cyanosis, or clubbing.  NEUROLOGIC: AO x1, pleasantly confused, no gross focal deficits PSYCHIATRIC: The patient is alert and disoriented to time and place.  Normal affect and good eye contact. SKIN: No obvious rash, lesion, or ulcer     Data Reviewed:    Latest Ref Rng & Units 11/02/2024    5:34 AM 10/30/2024    5:23 AM 10/29/2024    5:46 AM  CBC  WBC 4.0 -  10.5 K/uL 5.5  5.9  7.4   Hemoglobin 12.0 - 15.0 g/dL 89.3  89.0  88.3   Hematocrit 36.0 - 46.0 % 33.5  33.7  36.6    Platelets 150 - 400 K/uL 196  172  185        Latest Ref Rng & Units 11/02/2024    5:34 AM 10/30/2024    5:23 AM 10/29/2024    5:46 AM  BMP  Glucose 70 - 99 mg/dL 858  849  866   BUN 8 - 23 mg/dL 15  22  22    Creatinine 0.44 - 1.00 mg/dL 9.11  9.08  9.13   Sodium 135 - 145 mmol/L 143  146  143   Potassium 3.5 - 5.1 mmol/L 3.6  3.3  3.9   Chloride 98 - 111 mmol/L 108  108  104   CO2 22 - 32 mmol/L 24  24  23    Calcium  8.9 - 10.3 mg/dL 89.4  89.8  88.9      Vitals:   11/01/24 2049 11/02/24 0349 11/02/24 0831 11/02/24 1612  BP: 130/70 (!) 157/83 138/80 (!) 156/75  Pulse: 92 99 92 85  Resp: 17 16 17 17   Temp: 98 F (36.7 C) 98 F (36.7 C) 98.5 F (36.9 C) 97.7 F (36.5 C)  TempSrc:      SpO2: 97% 100% 100% 98%  Weight:      Height:         Author: Drue ONEIDA Potter, MD 11/02/2024 5:10 PM  For on call review [email or web address].  "

## 2024-11-02 NOTE — TOC Progression Note (Signed)
 Transition of Care [facility]) - Progression Note    Patient Details  Name: Lindsey [name] MRN: [id number] Date of Birth: January 12, 1951  Transition of [facility]) CM/SW Contact  Nathanael CHRISTELLA Ring, RN Phone Number: 11/02/2024, 1:40 PM  Clinical Narrative:    Daughter returned my call.  She would like to choose [facility].  [facility] can accept patient tomorrow.  Messaged MD to see when patient would be medically cleared for discharge so insurance auth can be started.                      Expected Discharge Plan and Services                                               Social Drivers of Health (SDOH) Interventions SDOH Screenings   Food Insecurity: Patient Unable To Answer (10/26/2024)  Housing: Unknown (10/26/2024)  Transportation Needs: Patient Unable To Answer (10/26/2024)  Utilities: Patient Unable To Answer (10/26/2024)  Alcohol Screen: Low Risk (08/22/2022)  Depression (PHQ2-9): Low Risk (10/23/2024)  Financial Resource Strain: Low Risk (09/04/2024)  Physical Activity: Insufficiently Active (10/23/2024)  Social Connections: Patient Unable To Answer (10/26/2024)  Recent Concern: Social Connections - Socially Isolated (10/23/2024)  Stress: No Stress Concern Present (10/23/2024)  Tobacco Use: Low Risk (10/25/2024)  Health Literacy: Adequate Health Literacy (10/23/2024)    Readmission Risk Interventions     No data to display

## 2024-11-02 NOTE — Progress Notes (Signed)
 Physical Therapy Treatment Patient Details Name: Lindsey [name] MRN: [id number] DOB: 13-Apr-1951 Today's Date: 11/02/2024   History of Present Illness Pt is a 74 y.o. female with medical history significant for gout, anxiety, depression, dementia, type 2 diabetes mellitus, fibromyalgia, dyslipidemia, hypertension, and stage IIIa chronic kidney disease, who presented to the emergency room with onset of altered mental status with increased confusion and generalized weakness with diminished energy and activity level.  MD assessment includes sepsis, tachycardia, hypernatremia, acute encephalopathy, and generalized weakness.    PT Comments  Pt pleasantly confused but with multi-modal cuing was able to put forth good effort during the session.  Pt required varying amounts of assist with heavy multi-modal cuing for sequencing with bed mobility tasks and +2 min A to come to upright standing at the EOB. Once in standing pt was able to perform two bouts of ambulation per below with +2 HHA with min to mod lean on hands for support but no overt LOB noted during the session.  Pt reported no adverse symptoms during the session with SpO2 and HR WNL throughout on room air.  Pt will benefit from continued PT services upon discharge to safely address deficits listed in patient problem list for decreased caregiver assistance and eventual return to PLOF.      If plan is discharge home, recommend the following: Two people to help with walking and/or transfers;A lot of help with bathing/dressing/bathroom;Assistance with cooking/housework;Supervision due to cognitive status;Direct supervision/assist for medications management;Direct supervision/assist for financial management;Assist for transportation;Help with stairs or ramp for entrance   Can travel by private vehicle     No  Equipment Recommendations  Other (comment) (TBD at next venue of care)    Recommendations for Other Services       Precautions /  Restrictions Precautions Precautions: Fall Recall of Precautions/Restrictions: Impaired Restrictions Weight Bearing Restrictions Per Provider Order: No     Mobility  Bed Mobility Overal bed mobility: Needs Assistance Bed Mobility: Sit to Supine, Supine to Sit Rolling: Supervision, Used rails   Supine to sit: Mod assist, +2 for physical assistance Sit to supine: Max assist, +2 for physical assistance   General bed mobility comments: Pt able to come to long sitting in bed with cues and SBA but required +2 mod A to pivot around to sitting at the EOB; +2 max A for BLE and trunk control to get from sitting back to supine    Transfers Overall transfer level: Needs assistance Equipment used: 2 person hand held assist Transfers: Sit to/from Stand Sit to Stand: Min assist, +2 physical assistance           General transfer comment: Mod verbal and tactile cues to initiate movement but pt only required +2 min A to come to full upright standing    Ambulation/Gait Ambulation/Gait assistance: Min assist, +2 physical assistance Gait Distance (Feet): 50 Feet x 1, 40 Feet x 1 Assistive device: 2 person hand held assist Gait Pattern/deviations: Step-through pattern, Decreased step length - right, Decreased step length - left, Shuffle Gait velocity: decreased     General Gait Details: Slow cadence with short bilatal step length and occasional shuffling steps with mod lean on the L hand and min on the R hand for support   Stairs             Wheelchair Mobility     Tilt Bed    Modified Rankin (Stroke Patients Only)       Balance Overall balance assessment: Needs assistance Sitting-balance  support: Feet supported, Single extremity supported Sitting balance-Leahy Scale: Good     Standing balance support: Bilateral upper extremity supported, During functional activity, Reliant on assistive device for balance Standing balance-Leahy Scale: Fair Standing balance comment: min  to mod lean on bilateral HHA for support during ambulation                            Communication Communication Communication: Impaired Factors Affecting Communication: Difficulty expressing self  Cognition Arousal: Alert Behavior During Therapy: WFL for tasks assessed/performed   PT - Cognitive impairments: History of cognitive impairments                         Following commands: Impaired Following commands impaired: Follows one step commands inconsistently    Cueing Cueing Techniques: Verbal cues, Tactile cues  Exercises      General Comments        Pertinent Vitals/Pain Pain Assessment Pain Assessment: PAINAD Breathing: normal Negative Vocalization: none Facial Expression: smiling or inexpressive Body Language: relaxed Consolability: no need to console PAINAD Score: 0    Home Living                          Prior Function            PT Goals (current goals can now be found in the care plan section) Progress towards PT goals: Progressing toward goals    Frequency    Min 2X/week      PT Plan      Co-evaluation              AM-PAC PT 6 Clicks Mobility   Outcome Measure  Help needed turning from your back to your side while in a flat bed without using bedrails?: A Little Help needed moving from lying on your back to sitting on the side of a flat bed without using bedrails?: A Lot Help needed moving to and from a bed to a chair (including a wheelchair)?: A Lot Help needed standing up from a chair using your arms (e.g., wheelchair or bedside chair)?: A Little Help needed to walk in hospital room?: A Lot Help needed climbing 3-5 steps with a railing? : A Lot 6 Click Score: 14    End of Session Equipment Utilized During Treatment: Gait belt Activity Tolerance: Patient tolerated treatment well Patient left: in bed;with call bell/phone within reach;with bed alarm set;with family/visitor present Nurse  Communication: Mobility status PT Visit Diagnosis: Other abnormalities of gait and mobility (R26.89);Unsteadiness on feet (R26.81);Difficulty in walking, not elsewhere classified (R26.2);Muscle weakness (generalized) (M62.81)     Time: 8548-8491 PT Time Calculation (min) (ACUTE ONLY): 17 min  Charges:    $Gait Training: 8-22 mins PT General Charges $$ ACUTE PT VISIT: 1 Visit                     D. Scott Ellanora Rayborn PT, DPT 11/02/24, 3:25 PM

## 2024-11-02 NOTE — TOC Progression Note (Signed)
 Transition of Care [facility]) - Progression Note    Patient Details  Name: Lindsey [name] MRN: [id number] Date of Birth: [birth year]  Transition of Care [facility]) CM/SW Contact  Nathanael CHRISTELLA Ring, RN Phone Number: 11/02/2024, 4:02 PM  Clinical Narrative:    Insurance authorization approved  PlanAuthID:[id number] Dates: 1/19-1/21/2026.  PASRR still pending.                     Expected Discharge Plan and Services                                               Social Drivers of Health (SDOH) Interventions SDOH Screenings   Food Insecurity: Patient Unable To Answer (10/26/2024)  Housing: Unknown (10/26/2024)  Transportation Needs: Patient Unable To Answer (10/26/2024)  Utilities: Patient Unable To Answer (10/26/2024)  Alcohol Screen: Low Risk (08/22/2022)  Depression (PHQ2-9): Low Risk (10/23/2024)  Financial Resource Strain: Low Risk (09/04/2024)  Physical Activity: Insufficiently Active (10/23/2024)  Social Connections: Patient Unable To Answer (10/26/2024)  Recent Concern: Social Connections - Socially Isolated (10/23/2024)  Stress: No Stress Concern Present (10/23/2024)  Tobacco Use: Low Risk (10/25/2024)  Health Literacy: Adequate Health Literacy (10/23/2024)    Readmission Risk Interventions     No data to display

## 2024-11-02 NOTE — TOC Progression Note (Signed)
 Transition of Care [facility]) - Progression Note    Patient Details  Name: Lindsey [name] MRN: [id number] Date of Birth: 10/01/1951  Transition of Care [facility]) CM/SW Contact  Nathanael CHRISTELLA Ring, RN Phone Number: 11/02/2024, 3:38 PM  Clinical Narrative:    Insurance auth started and pending.  PASRR pending, PASRR closed today for holiday.                      Expected Discharge Plan and Services                                               Social Drivers of Health (SDOH) Interventions SDOH Screenings   Food Insecurity: Patient Unable To Answer (10/26/2024)  Housing: Unknown (10/26/2024)  Transportation Needs: Patient Unable To Answer (10/26/2024)  Utilities: Patient Unable To Answer (10/26/2024)  Alcohol Screen: Low Risk (08/22/2022)  Depression (PHQ2-9): Low Risk (10/23/2024)  Financial Resource Strain: Low Risk (09/04/2024)  Physical Activity: Insufficiently Active (10/23/2024)  Social Connections: Patient Unable To Answer (10/26/2024)  Recent Concern: Social Connections - Socially Isolated (10/23/2024)  Stress: No Stress Concern Present (10/23/2024)  Tobacco Use: Low Risk (10/25/2024)  Health Literacy: Adequate Health Literacy (10/23/2024)    Readmission Risk Interventions     No data to display

## 2024-11-02 NOTE — Progress Notes (Signed)
 Mobility Specialist - Progress Note   11/02/24 1017  Mobility  Activity Ambulated with assistance  Level of Assistance Contact guard assist, steadying assist (+2 for safety)  Assistive Device Front wheel walker  Distance Ambulated (ft) 6 ft  Activity Response Tolerated well  Mobility visit 1 Mobility  Mobility Specialist Start Time (ACUTE ONLY) 0944  Mobility Specialist Stop Time (ACUTE ONLY) 1004  Mobility Specialist Time Calculation (min) (ACUTE ONLY) 20 min   Pt supine upon entry, utilizing RA. She required ModA for bed mob, amb towards the recliner CGA +2 for safety. OT enters and acquires session.

## 2024-11-02 NOTE — Progress Notes (Signed)
 SPIRITUAL CARE AND COUNSELING CONSULT NOTE   VISIT SUMMARY Chaplain provided spiritual/emotional support to Lindsey [name] while rounding on unit. Chaplain consulted with nurse team.   ZELPHIA GUILE                                                                                                                                                                      Type of Visit: Initial Care provided to:: Patient Referral source: Chaplain assessment Reason for visit: Routine spiritual support OnCall Visit: No   SPIRITUAL FRAMEWORK  Presenting Themes: Significant life change, Values and beliefs, Meaning/purpose/sources of inspiration Community/Connection: Designer, multimedia, Faith community Patient Stress Factors: Health changes Family Stress Factors: None identified   GOALS   Self/Personal Goals: healing Clinical Care Goals: healing   INTERVENTIONS   Spiritual Care Interventions Made: Compassionate presence, Explored values/beliefs/practices/strengths    INTERVENTION OUTCOMES   Outcomes: Connection to spiritual care, Awareness around self/spiritual resourses  Chaplain provided compassionate presence, reflective listening, and open ended questions to elicit [name]'s feelings about health status, treatment plan, family support, [name] faith community, and desired outcomes.    SPIRITUAL CARE PLAN   Spiritual Care Issues Still Outstanding: Chaplain will continue to follow    If immediate needs arise, please contact [facility] 24 hour on call [phone]   Barabara Chess, Chaplain  11/02/2024 12:37 PM

## 2024-11-02 NOTE — Progress Notes (Signed)
 Occupational Therapy Treatment Patient Details Name: Lindsey [name] MRN: [id number] DOB: 06-19-1951 Today's Date: 11/02/2024   History of present illness Pt is a 74 y.o. female with medical history significant for gout, anxiety, depression, dementia, type 2 diabetes mellitus, fibromyalgia, dyslipidemia, hypertension, and stage IIIa chronic kidney disease, who presented to the emergency room with onset of altered mental status with increased confusion and generalized weakness with diminished energy and activity level.  MD assessment includes sepsis, tachycardia, hypernatremia, acute encephalopathy, and generalized weakness.   OT comments  Pt is seated on BSC with mobility techs present on arrival. Pleasantly confused, but denies pain. Pt performed 2 STS bouts from [facility] with Mod A and max multi modal cueing for hand/feet placement. Max A required for standing peri-hygiene after BM and for LB dressing to change out pull-up. UB dressing for gown change with Mod A. Pt agreeable to short distance ambulation bout ~15 ft using RW with Min/mod A for guidance of RW and frequent cues for proximity. CGA with frequent cueing to lateral scoot to [facility] and Mod A for BLE management to return to supine. Pt left with nurse present providing meds. She will cont to require skilled acute OT services to maximize her safety and IND to return to PLOF.       If plan is discharge home, recommend the following:  Assistance with cooking/housework;Direct supervision/assist for medications management;Direct supervision/assist for financial management;Assist for transportation;Help with stairs or ramp for entrance;Supervision due to cognitive status;A little help with walking and/or transfers;A lot of help with walking and/or transfers;A lot of help with bathing/dressing/bathroom   Equipment Recommendations  None recommended by OT;Other (comment) (defer to next venue)    Recommendations for Other Services      Precautions /  Restrictions Precautions Precautions: Fall Recall of Precautions/Restrictions: Impaired Restrictions Weight Bearing Restrictions Per Provider Order: No       Mobility Bed Mobility Overal bed mobility: Needs Assistance Bed Mobility: Sit to Supine       Sit to supine: Mod assist   General bed mobility comments: pt found seated on BSC with mobility specialists on entry, assisted with BLE management to return to supine and reposition in bed    Transfers Overall transfer level: Needs assistance Equipment used: Rolling walker (2 wheels) Transfers: Sit to/from Stand Sit to Stand: Mod assist           General transfer comment: to stand from [facility] 2 bouts and then Min/Mod A + RW and max multimodal cues to ambulate ~15 ft     Balance Overall balance assessment: Needs assistance Sitting-balance support: Feet supported Sitting balance-Leahy Scale: Good     Standing balance support: Bilateral upper extremity supported, During functional activity, Reliant on assistive device for balance Standing balance-Leahy Scale: Fair Standing balance comment: RW                           ADL either performed or assessed with clinical judgement   ADL Overall ADL's : Needs assistance/impaired                 Upper Body Dressing : Sitting;Minimal assistance Upper Body Dressing Details (indicate cue type and reason): change out gowns Lower Body Dressing: Sit to/from stand;Maximal assistance Lower Body Dressing Details (indicate cue type and reason): donn pull up Toilet Transfer: Moderate assistance;BSC/3in1;Rolling walker (2 wheels) Toilet Transfer Details (indicate cue type and reason): STS from [facility] x2 for hygiene, clothing management and ambulation bout  Toileting- Clothing Manipulation and Hygiene: Maximal assistance;Sit to/from stand Toileting - Clothing Manipulation Details (indicate cue type and reason): standing peri-care     Functional mobility during ADLs: Rolling  walker (2 wheels);Minimal assistance;Moderate assistance      Extremity/Trunk Assessment              Vision       Perception     Praxis     Communication Communication Communication: Impaired Factors Affecting Communication: Difficulty expressing self   Cognition Arousal: Alert Behavior During Therapy: WFL for tasks assessed/performed                                 Following commands: Impaired Following commands impaired: Follows one step commands inconsistently      Cueing   Cueing Techniques: Verbal cues, Tactile cues  Exercises      Shoulder Instructions       General Comments      Pertinent Vitals/ Pain       Pain Assessment Pain Assessment: Faces Faces Pain Scale: No hurt  Home Living                                          Prior Functioning/Environment              Frequency  Min 2X/week        Progress Toward Goals  OT Goals(current goals can now be found in the care plan section)  Progress towards OT goals: Progressing toward goals  Acute Rehab OT Goals OT Goal Formulation: Patient unable to participate in goal setting Time For Goal Achievement: 11/09/24 Potential to Achieve Goals: Fair  Plan      Co-evaluation                 AM-PAC OT 6 Clicks Daily Activity     Outcome Measure   Help from another person eating meals?: A Little Help from another person taking care of personal grooming?: A Little Help from another person toileting, which includes using toliet, bedpan, or urinal?: A Lot Help from another person bathing (including washing, rinsing, drying)?: A Lot Help from another person to put on and taking off regular upper body clothing?: A Little Help from another person to put on and taking off regular lower body clothing?: A Lot 6 Click Score: 15    End of Session Equipment Utilized During Treatment: Rolling walker (2 wheels)  OT Visit Diagnosis: Unsteadiness on feet  (R26.81);Repeated falls (R29.6);Muscle weakness (generalized) (M62.81)   Activity Tolerance Patient tolerated treatment well   Patient Left in bed;with call bell/phone within reach;with bed alarm set;with nursing/sitter in room   Nurse Communication Mobility status        Time: 8997-8977 OT Time Calculation (min): 20 min  Charges: OT General Charges $OT Visit: 1 Visit OT Treatments $Self Care/Home Management : 8-22 mins  Annaelle Kasel Chrismon, OTR/L  11/02/24, 3:23 PM   [place] Goeken E Chrismon 11/02/2024, 3:20 PM

## 2024-11-02 NOTE — TOC Progression Note (Signed)
 Transition of Care [facility]) - Progression Note    Patient Details  Name: Lindsey [name] MRN: [id number] Date of Birth: Jan 27, 1951  Transition of Care [facility]) CM/SW Contact  Nathanael CHRISTELLA Ring, RN Phone Number: 11/02/2024, 12:54 PM  Clinical Narrative:     Called and left a message for patient's daughter to return call.  Patient has 3 bed offers for  [facility], [employer], and [facility].   Met with patient at the bedside, she is pleasantly confused.                      Expected Discharge Plan and Services                                               Social Drivers of Health (SDOH) Interventions SDOH Screenings   Food Insecurity: Patient Unable To Answer (10/26/2024)  Housing: Unknown (10/26/2024)  Transportation Needs: Patient Unable To Answer (10/26/2024)  Utilities: Patient Unable To Answer (10/26/2024)  Alcohol Screen: Low Risk (08/22/2022)  Depression (PHQ2-9): Low Risk (10/23/2024)  Financial Resource Strain: Low Risk (09/04/2024)  Physical Activity: Insufficiently Active (10/23/2024)  Social Connections: Patient Unable To Answer (10/26/2024)  Recent Concern: Social Connections - Socially Isolated (10/23/2024)  Stress: No Stress Concern Present (10/23/2024)  Tobacco Use: Low Risk (10/25/2024)  Health Literacy: Adequate Health Literacy (10/23/2024)    Readmission Risk Interventions     No data to display

## 2024-11-03 DIAGNOSIS — G9340 Encephalopathy, unspecified: Secondary | ICD-10-CM | POA: Diagnosis not present

## 2024-11-03 LAB — GLUCOSE, CAPILLARY
Glucose-Capillary: 99 mg/dL (ref 70–99)
Glucose-Capillary: 320 mg/dL — ABNORMAL HIGH (ref 70–99)
Glucose-Capillary: 154 mg/dL — ABNORMAL HIGH (ref 70–99)

## 2024-11-03 NOTE — TOC Progression Note (Signed)
 Transition of Care [facility]) - Progression Note    Patient Details  Name: Lindsey [name] MRN: [id number] Date of Birth: 18-Jun-1951  Transition of Care [facility]) CM/SW Contact  Corean ONEIDA Haddock, RN Phone Number: 11/03/2024, 3:58 PM  Clinical Narrative:     PASRR received [phone] E valid through 12/03/24  Message sent to [facility] to determine if patient can admit today, awaiting response.  However they typically have a 3 pm cut off for admissions                    Expected Discharge Plan and Services         Expected Discharge Date: 11/03/24                                     Social Drivers of Health (SDOH) Interventions SDOH Screenings   Food Insecurity: Patient Unable To Answer (10/26/2024)  Housing: Unknown (10/26/2024)  Transportation Needs: Patient Unable To Answer (10/26/2024)  Utilities: Patient Unable To Answer (10/26/2024)  Alcohol Screen: Low Risk (08/22/2022)  Depression (PHQ2-9): Low Risk (10/23/2024)  Financial Resource Strain: Low Risk (09/04/2024)  Physical Activity: Insufficiently Active (10/23/2024)  Social Connections: Patient Unable To Answer (10/26/2024)  Recent Concern: Social Connections - Socially Isolated (10/23/2024)  Stress: No Stress Concern Present (10/23/2024)  Tobacco Use: Low Risk (10/25/2024)  Health Literacy: Adequate Health Literacy (10/23/2024)    Readmission Risk Interventions     No data to display

## 2024-11-03 NOTE — TOC Transition Note (Signed)
 Transition of Care [facility]) - Discharge Note   Patient Details  Name: Lindsey [name] MRN: [id number] Date of Birth: 1951-07-03  Transition of Care [facility]) CM/SW Contact:  Lauraine JAYSON Carpen, LCSW Phone Number: 11/03/2024, 4:40 PM   Clinical Narrative:  Patient has orders to discharge to [facility] today. RN will call report to [phone] (Room 505). [facility] has been arranged. They are on 3 active calls and then she is next on the list. No further concerns. CSW signing off.   Final next level of care: Skilled Nursing Facility Barriers to Discharge: Barriers Resolved   Patient Goals and CMS Choice     Choice offered to / list presented to : Adult Children      Discharge Placement PASRR number recieved: 11/03/24            Patient chooses bed at: [facility] Patient to be transferred to facility by: [facility] Name of family member notified: Rosaline Charon Patient and family notified of of transfer: 11/03/24  Discharge Plan and Services Additional resources added to the After Visit Summary for                                       Social Drivers of Health (SDOH) Interventions SDOH Screenings   Food Insecurity: Patient Unable To Answer (10/26/2024)  Housing: Unknown (10/26/2024)  Transportation Needs: Patient Unable To Answer (10/26/2024)  Utilities: Patient Unable To Answer (10/26/2024)  Alcohol Screen: Low Risk (08/22/2022)  Depression (PHQ2-9): Low Risk (10/23/2024)  Financial Resource Strain: Low Risk (09/04/2024)  Physical Activity: Insufficiently Active (10/23/2024)  Social Connections: Patient Unable To Answer (10/26/2024)  Recent Concern: Social Connections - Socially Isolated (10/23/2024)  Stress: No Stress Concern Present (10/23/2024)  Tobacco Use: Low Risk (10/25/2024)  Health Literacy: Adequate Health Literacy (10/23/2024)     Readmission Risk Interventions     No data to display

## 2024-11-03 NOTE — Progress Notes (Addendum)
 Mobility Specialist - Progress Note   11/03/24 1105  Mobility  Activity Ambulated with assistance;Stood with assistance  Level of Assistance Minimal assist, patient does 75% or more  Assistive Device None (HHA)  Distance Ambulated (ft) 30 ft  Activity Response Tolerated well  Mobility visit 1 Mobility  Mobility Specialist Start Time (ACUTE ONLY) 1039  Mobility Specialist Stop Time (ACUTE ONLY) 1052  Mobility Specialist Time Calculation (min) (ACUTE ONLY) 13 min   Pt supine upon entry, utilizing RA. Pt completed bed mob MinA, STS x3 while MS doffed/donned brief. She required MinA +2 HHA during amb to the NS, tolerated well. Pt returned to the room, left supine with alarm set and needs within reach.  America Silvan Mobility Specialist 11/03/24 11:18 AM

## 2024-11-03 NOTE — TOC Progression Note (Addendum)
 Transition of Care [facility]) - Progression Note    Patient Details  Name: Lindsey [name] MRN: [id number] Date of Birth: [birth year]  Transition of Care [facility]) CM/SW Contact  Corean ONEIDA Haddock, RN Phone Number: 11/03/2024, 11:42 AM  Clinical Narrative:     Confirmed with Nena at [facility] that clinical as been received and is still pending      Update:  135 pm PASRR pending               Expected Discharge Plan and Services         Expected Discharge Date: 11/03/24                                     Social Drivers of Health (SDOH) Interventions SDOH Screenings   Food Insecurity: Patient Unable To Answer (10/26/2024)  Housing: Unknown (10/26/2024)  Transportation Needs: Patient Unable To Answer (10/26/2024)  Utilities: Patient Unable To Answer (10/26/2024)  Alcohol Screen: Low Risk (08/22/2022)  Depression (PHQ2-9): Low Risk (10/23/2024)  Financial Resource Strain: Low Risk (09/04/2024)  Physical Activity: Insufficiently Active (10/23/2024)  Social Connections: Patient Unable To Answer (10/26/2024)  Recent Concern: Social Connections - Socially Isolated (10/23/2024)  Stress: No Stress Concern Present (10/23/2024)  Tobacco Use: Low Risk (10/25/2024)  Health Literacy: Adequate Health Literacy (10/23/2024)    Readmission Risk Interventions     No data to display

## 2024-11-03 NOTE — Discharge Summary (Signed)
 " Physician Discharge Summary   Patient: Lindsey [name] MRN: [id number] DOB: 15-Oct-1951  Admit date:     10/25/2024  Discharge date: 11/03/24  Discharge Physician: Drue ONEIDA Potter   PCP: Sowles, Krichna, MD   Recommendations at discharge:  Follow-up with PCP  Discharge Diagnoses:  Acute encephalopathy --resolved Suspect secondary to infectious etiology vs dehydration vs medication, worsening dementia Suspect Sepsis due to undetermined organism Sinus tachycardia (POA) Hypernatremia Mild bump in creatinine Mild hypercalcemia Normocytic anemia Small pericardial effusion on CT Dementia with behavioral disturbance Anxiety and depression Controlled type 2 diabetes mellitus without complication, without long-term current use of insulin  Essential hypertension Gout Right kidney cystic lesion Incidental findings  Generalized weakness  Hospital Course:  From HPI Lindsey [name] is a 74 y.o. Caucasian female with medical history significant for gout, anxiety, depression, dementia, type 2 diabetes mellitus, fibromyalgia, dyslipidemia, hypertension, and stage IIIa chronic kidney disease, who presented to the emergency room with onset of altered mental status with increased confusion and generalized weakness with diminished energy and activity level over the last 10 days.  Per daughter patient did not have any focal complaints.  Denies SOB, DOE, chest pain, palpitations, nausea/vomiting, abdominal pain, urinary symptoms.  Limited due to patient's dementia, unclear if she is getting adequate p.o. intake   Patient admitted for acute encephalopathy, possible sepsis vs dehydration, hospital course as below    Other hospital course as shown below   Assessment and Plan:   Acute encephalopathy -resolved Suspect secondary to infectious etiology vs dehydration vs medication, worsening dementia CT head negative for acute changes Completed empiric antibiotic therapy Hold off on further imaging MRI,  per discussion with daughter Continue sertraline    Suspect Sepsis due to undetermined organism Found to be tachycardic on presentation Lactic acidosis resolved, No leukocytosis COVID/flu/RSV negative, UA negative CT with no evidence of acute infection Completed empiric course of Zosyn    Sinus tachycardia (POA) Unclear etiology, suspect if secondary to sepsis Workup negative for PE Received IV fluid   Hypernatremia Resolved s/p IV fluids Monitor BMP pm   Mild bump in creatinine Mild hypercalcemia Improved s/p IV fluids   Normocytic anemia Monitor Hb   Small pericardial effusion on CT Echocardiogram showed EF 60 to 65%   Dementia with behavioral disturbance Anxiety and depression continue Aricept , Namenda  Continue sertraline    Controlled type 2 diabetes mellitus without complication, without long-term  Resume metformin  at discharge Continue Jardiance , Actos , SSI   Essential hypertension Continue Losartan , Coreg    Gout continue allopurinol    Right kidney cystic lesion Progressive 2.0 cm complex cystic lesion in the interpolar right kidney, suspicious for cystic renal neoplasm  Was found on prior CT dated 04/2024 at [facility] Daughter reports chose not to do further workup   Incidental findings  mildly progressive pancreatic pseudocysts Stable T12 compression deformity Probable pulmonary hypertension   Generalized weakness AM cortisol normal Patient daughters have agreed for placement   Consultants: None Procedures performed: None Disposition: Skilled nursing facility Diet recommendation:  Cardiac and Carb modified diet DISCHARGE MEDICATION: Allergies as of 11/03/2024       Reactions   Ace Inhibitors         Medication List     STOP taking these medications    QUEtiapine  100 MG tablet Commonly known as: SEROQUEL        TAKE these medications    allopurinol  100 MG tablet Commonly known as: ZYLOPRIM  Take 1 tablet (100 mg total) by mouth 2  (two) times daily.  carvedilol  6.25 MG tablet Commonly known as: COREG  Take 1 tablet (6.25 mg total) by mouth 2 (two) times daily with a meal.   cholecalciferol  25 MCG (1000 UNIT) tablet Commonly known as: VITAMIN D3 Take 1 tablet (1,000 Units total) by mouth daily.   donepezil  10 MG tablet Commonly known as: ARICEPT  Take 1 tablet (10 mg total) by mouth at bedtime.   empagliflozin  25 MG Tabs tablet Commonly known as: Jardiance  Take 1 tablet (25 mg total) by mouth daily.   losartan  25 MG tablet Commonly known as: COZAAR  Take 1 tablet (25 mg total) by mouth daily.   memantine  10 MG tablet Commonly known as: NAMENDA  Take 10 mg by mouth 2 (two) times daily.   metFORMIN  500 MG 24 hr tablet Commonly known as: GLUCOPHAGE -XR Take 1 tablet (500 mg total) by mouth 2 (two) times daily with a meal.   MULTIPLE VITAMIN PO Take 1 tablet by mouth daily.   pioglitazone  15 MG tablet Commonly known as: Actos  Take 1 tablet (15 mg total) by mouth daily.   polyethylene glycol powder 17 GM/SCOOP powder Commonly known as: GLYCOLAX /MIRALAX  Take 17 g by mouth daily.   rosuvastatin  20 MG tablet Commonly known as: CRESTOR  Take 1 tablet (20 mg total) by mouth daily.   sertraline  50 MG tablet Commonly known as: ZOLOFT  Take 50 mg by mouth every evening.   sulfamethoxazole -trimethoprim  400-80 MG tablet Commonly known as: Bactrim  Take 1 tablet by mouth once a week.        Contact information for after-discharge care     Destination     [facility] .   Service: Skilled Nursing Contact information: [address] [place]  [phone]             Home Medical Care     [facility]) .   Service: Home Health Services Contact information: [address] [place]  [address] [phone]                    Discharge Exam: Fredricka  Weights   10/25/24 1702  Weight: 63.5 kg   GENERAL: Elderly female laying in bed in no acute distress LUNGS: clear to auscultation bilaterally CARDIOVASCULAR: Regular rate and tachycardic rhythm, S1, S2 normal. No murmurs, rubs, or gallops.  ABDOMEN: Soft, nondistended, nontender. Bowel sounds present. No organomegaly or mass.  EXTREMITIES: No pedal edema, cyanosis, or clubbing.  NEUROLOGIC: Exhibiting symptoms of dementia PSYCHIATRIC: The patient is alert and disoriented to time and place.  Normal affect and good eye contact. SKIN: No obvious rash, lesion, or ulcer  Condition at discharge: good  The results of significant diagnostics from this hospitalization (including imaging, microbiology, ancillary and laboratory) are listed below for reference.   Imaging Studies: DG Chest 1 View Result Date: 10/28/2024 CLINICAL DATA:  Pulmonary edema EXAM: CHEST  1 VIEW COMPARISON:  Three days ago FINDINGS: The heart size and mediastinal contours are within normal limits. Both lungs are clear. The visualized skeletal structures are unremarkable. IMPRESSION: No active disease. Electronically Signed   By: Lynwood Landy Raddle M.D.   On: 10/28/2024 07:53   ECHOCARDIOGRAM COMPLETE Result Date: 10/27/2024    ECHOCARDIOGRAM REPORT   Patient Name:   MAVA SUARES Date of Exam: 10/26/2024 Medical Rec #:  [id number]     Height:       59.0 in Accession #:    [id number]  Weight:       140.0 lb Date of Birth:  1950/12/11    BSA:          1.585 m Patient Age:    73 years      BP:           146/78 mmHg Patient Gender: F             HR:           101 bpm. Exam Location:  [facility] Procedure: 2D Echo, Cardiac Doppler and Color Doppler (Both Spectral and Color            Flow Doppler were utilized during procedure). Indications:     I31.3 Pericardial Effusion  History:         Patient has no prior history of Echocardiogram examinations.                  Risk Factors:Hypertension, Diabetes and Dyslipidemia.  Sonographer:      Carl Coma RDCS Referring Phys:  [id number] [facility] Diagnosing Phys: Cara JONETTA Lovelace MD IMPRESSIONS  1. Left ventricular ejection fraction, by estimation, is 60 to 65%. The left ventricle has normal function. The left ventricle has no regional wall motion abnormalities. Left ventricular diastolic parameters are consistent with Grade I diastolic dysfunction (impaired relaxation).  2. Right ventricular systolic function is normal. The right ventricular size is normal.  3. Right atrial size was mildly dilated.  4. The mitral valve is normal in structure. Trivial mitral valve regurgitation.  5. The aortic valve is normal in structure. Aortic valve regurgitation is not visualized. Aortic valve sclerosis is present, with no evidence of aortic valve stenosis. FINDINGS  Left Ventricle: Left ventricular ejection fraction, by estimation, is 60 to 65%. The left ventricle has normal function. The left ventricle has no regional wall motion abnormalities. Strain was performed and the global longitudinal strain is indeterminate. The left ventricular internal cavity size was normal in size. There is no left ventricular hypertrophy. Left ventricular diastolic parameters are consistent with Grade I diastolic dysfunction (impaired relaxation). Right Ventricle: The right ventricular size is normal. No increase in right ventricular wall thickness. Right ventricular systolic function is normal. Left Atrium: Left atrial size was normal in size. Right Atrium: Right atrial size was mildly dilated. Pericardium: There is no evidence of pericardial effusion. Mitral Valve: The mitral valve is normal in structure. Trivial mitral valve regurgitation. Tricuspid Valve: The tricuspid valve is normal in structure. Tricuspid valve regurgitation is trivial. Aortic Valve: The aortic valve is normal in structure. Aortic valve regurgitation is not visualized. Aortic valve sclerosis is present, with no evidence of aortic valve stenosis.  Pulmonic Valve: The pulmonic valve was not well visualized. Pulmonic valve regurgitation is not visualized. Aorta: The ascending aorta was not well visualized. IAS/Shunts: No atrial level shunt detected by color flow Doppler. Additional Comments: 3D was performed not requiring image post processing on an independent workstation and was indeterminate.  LEFT VENTRICLE PLAX 2D LVIDd:         4.10 cm   Diastology LVIDs:         2.70 cm   LV e' medial:    6.31 cm/s LV PW:         1.00 cm   LV E/e' medial:  10.9 LV IVS:        1.00 cm   LV e' lateral:   6.71 cm/s LVOT diam:     2.10 cm   LV E/e' lateral: 10.3  LV SV:         52 LV SV Index:   33 LVOT Area:     3.46 cm  RIGHT VENTRICLE             IVC RV Basal diam:  3.80 cm     IVC diam: 1.10 cm RV S prime:     16.17 cm/s TAPSE (M-mode): 1.8 cm LEFT ATRIUM             Index        RIGHT ATRIUM           Index LA diam:        4.00 cm 2.52 cm/m   RA Area:     10.20 cm LA Vol (A2C):   36.4 ml 22.97 ml/m  RA Volume:   24.30 ml  15.33 ml/m LA Vol (A4C):   24.1 ml 15.21 ml/m LA Biplane Vol: 32.1 ml 20.25 ml/m  AORTIC VALVE LVOT Vmax:   93.70 cm/s LVOT Vmean:  62.800 cm/s LVOT VTI:    0.151 m  AORTA Ao Root diam: 3.60 cm Ao Asc diam:  3.30 cm MITRAL VALVE MV Area (PHT): 5.13 cm     SHUNTS MV Decel Time: 148 msec     Systemic VTI:  0.15 m MV E velocity: 69.05 cm/s   Systemic Diam: 2.10 cm MV A velocity: 112.00 cm/s MV E/A ratio:  0.62 Dwayne D Callwood MD Electronically signed by Cara JONETTA Lovelace MD Signature Date/Time: 10/27/2024/2:32:18 PM    Final    CT Angio Chest PE W and/or Wo Contrast Result Date: 10/26/2024 EXAM: CTA of the Chest with contrast for PE 10/26/2024 01:52:04 AM TECHNIQUE: CTA of the chest was performed without and with the administration of 100 mL of iohexol  (OMNIPAQUE ) 350 MG/ML injection. Multiplanar reformatted images are provided for review. MIP images are provided for review. Automated exposure control, iterative reconstruction, and/or weight  based adjustment of the mA/kV was utilized to reduce the radiation dose to as low as reasonably achievable. COMPARISON: None available. CLINICAL HISTORY: Pulmonary embolism (PE) suspected. Altered mental status, lethargy, generalized weakness. FINDINGS: PULMONARY ARTERIES: Pulmonary arteries are adequately opacified for evaluation. No pulmonary embolism. The central pulmonary arteries are enlarged in keeping with changes of pulmonary arterial hypertension. MEDIASTINUM: Extensive multivessel coronary artery calcifications. Small pericardial effusion. Moderate atherosclerotic calcification within the thoracic aorta. There is no acute abnormality of the thoracic aorta. LYMPH NODES: No mediastinal, hilar or axillary lymphadenopathy. LUNGS AND PLEURA: Scattered bilateral linear atelectasis. No focal consolidation or pulmonary edema. No pleural effusion or pneumothorax. UPPER ABDOMEN: 4.3 cm cystic lesion within the mid body of the pancreas is not well assessed on this examination. Another cystic lesion is also noted, size not specified. These appear stable since prior CT examination of the abdomen and pelvis of 05/06/2024. SOFT TISSUES AND BONES: T12 anterior wedge compression deformity with approximately 50% loss of height anteriorly and T7 compression deformity with 50 - 60% loss of height, appear remote in nature. Remote superior endplate fracture of T3 appears remote. Remote, healed transverse sternal body fracture and manubrial fractures. Healed bilateral rib fractures noted. No acute bone or soft tissue abnormality. IMPRESSION: 1. No pulmonary embolism. 2. Enlarged central pulmonary arteries, consistent with pulmonary arterial hypertension. 3. Small pericardial effusion. 4. Extensive multivessel coronary artery calcifications. 5. Additional incidental findings as noted above. 6. RAF score includes aortic atherosclerosis (ICD10-I70.0). Electronically signed by: Dorethia Molt MD MD 10/26/2024 02:03 AM EST RP  Workstation: [id number]   CT  ABDOMEN PELVIS W CONTRAST Result Date: 10/26/2024 EXAM: CT ABDOMEN AND PELVIS WITH CONTRAST 10/26/2024 01:52:04 AM TECHNIQUE: CT of the abdomen and pelvis was performed with the administration of 100 mL of iohexol  (OMNIPAQUE ) 350 MG/ML injection. Multiplanar reformatted images are provided for review. Automated exposure control, iterative reconstruction, and/or weight-based adjustment of the mA/kV was utilized to reduce the radiation dose to as low as reasonably achievable. COMPARISON: 05/06/2024 CLINICAL HISTORY: Sepsis FINDINGS: LOWER CHEST: Dedicated CTA chest reported separately. LIVER: The liver is unremarkable. GALLBLADDER AND BILE DUCTS: Gallbladder is unremarkable. No biliary ductal dilatation. SPLEEN: No acute abnormality. PANCREAS: Dominant 4.3 x 3.3 cm cystic lesion along the pancreatic body, likely reflecting a pseudocyst (image 25), previously 3.2 x 2.9 cm. Adjacent 14 mm pseudocyst in the pancreatic neck, previously 12 mm. ADRENAL GLANDS: No acute abnormality. KIDNEYS, URETERS AND BLADDER: 2.0 cm complex cystic lesion in the interpolar right kidney with suspected enhancing septations (image 35), previously 14 mm, suspicious for cystic renal neoplasm (Bosniak 3). No stones in the kidneys or ureters. No hydronephrosis. No perinephric or periureteral stranding. Urinary bladder is unremarkable. GI AND BOWEL: Stomach demonstrates no acute abnormality. Mild to moderate colonic stool burden. There is no bowel obstruction. PERITONEUM AND RETROPERITONEUM: No ascites. No free air. VASCULATURE: Aorta is normal in caliber. Aortic atherosclerosis. LYMPH NODES: No lymphadenopathy. REPRODUCTIVE ORGANS: No acute abnormality. BONES AND SOFT TISSUES: Moderate compression fracture deformities at L1 and L3, chronic. No focal soft tissue abnormality. IMPRESSION: 1. Mildly progressive pancreatic pseudocysts. 2. Progressive 2.0 cm complex cystic lesion in the interpolar right kidney,  suspicious for cystic renal neoplasm (Bosniak III). Urology consultation is suggested with consideration of follow-up MR abdomen with/without contrast, as clinically warranted. Electronically signed by: Pinkie Pebbles MD 10/26/2024 02:02 AM EST RP Workstation: [id number]   CT Head Wo Contrast Result Date: 10/26/2024 EXAM: CT HEAD WITHOUT CONTRAST 10/26/2024 01:52:04 AM TECHNIQUE: CT of the head was performed without the administration of intravenous contrast. Automated exposure control, iterative reconstruction, and/or weight based adjustment of the mA/kV was utilized to reduce the radiation dose to as low as reasonably achievable. COMPARISON: 05/06/2024 CLINICAL HISTORY: Mental status change, unknown cause. FINDINGS: BRAIN AND VENTRICLES: No acute hemorrhage. No evidence of acute infarct. Cerebral ventricle sizes are concordant with the degree of cerebral volume loss. Patchy and confluent areas of decreased attenuation are noted throughout the deep and periventricular white matter of the cerebral hemispheres bilaterally suggestive of chronic microvascular ischemic changes. No hydrocephalus. No extra-axial collection. No mass effect or midline shift. Moderate calcific atheromatous disease within carotid siphons and vertebral arteries. ORBITS: No acute abnormality. SINUSES: No acute abnormality. SOFT TISSUES AND SKULL: No acute soft tissue abnormality. No skull fracture. IMPRESSION: 1. No acute intracranial abnormality. Electronically signed by: Morgane Naveau MD MD 10/26/2024 01:56 AM EST RP Workstation: [id number]   DG Chest 2 View if patient is not in a treatment room. Result Date: 10/25/2024 EXAM: 2 VIEW(S) XRAY OF THE CHEST 10/25/2024 05:36:00 PM COMPARISON: 05/06/2024 CLINICAL HISTORY: Suspected Sepsis FINDINGS: LUNGS AND PLEURA: Low lung volumes. Bibasilar atelectasis. Diffuse interstitial coarsening. No focal pulmonary opacity. No pleural effusion. No pneumothorax. HEART AND MEDIASTINUM: Aortic  atherosclerotic calcification. No acute abnormality of the cardiac and mediastinal silhouettes. BONES AND SOFT TISSUES: Stable T12 compression deformity. No acute osseous abnormality. IMPRESSION: 1. Low lung volumes with bibasilar atelectasis and diffuse interstitial coarsening. 2. Aortic atherosclerotic calcification. 3. Stable T12 compression deformity. Electronically signed by: Dorethia Molt MD MD 10/25/2024 05:50 PM EST RP Workstation: [id number]  Microbiology: Results for orders placed or performed during the hospital encounter of 10/25/24  Culture, blood (Routine x 2)     Status: None   Collection Time: 10/25/24  5:08 PM   Specimen: BLOOD  Result Value Ref Range Status   Specimen Description BLOOD BLOOD RIGHT FOREARM  Final   Special Requests   Final    BOTTLES DRAWN AEROBIC AND ANAEROBIC Blood Culture results may not be optimal due to an inadequate volume of blood received in culture bottles   Culture   Final    NO GROWTH 5 DAYS Performed at [facility], [address] [place], [place] [address]    Report Status 10/30/2024 FINAL  Final  Resp panel by RT-PCR (RSV, Flu A&B, Covid) Anterior Nasal Swab     Status: None   Collection Time: 10/25/24  5:10 PM   Specimen: Anterior Nasal Swab  Result Value Ref Range Status   SARS Coronavirus 2 by RT PCR NEGATIVE NEGATIVE Final    Comment: (NOTE) SARS-CoV-2 target nucleic acids are NOT DETECTED.  The SARS-CoV-2 RNA is generally detectable in upper respiratory specimens during the acute phase of infection. The lowest concentration of SARS-CoV-2 viral copies this assay can detect is 138 copies/mL. A negative result does not preclude SARS-Cov-2 infection and should not be used as the sole basis for treatment or other patient management decisions. A negative result may occur with  improper specimen collection/handling, submission of specimen other than nasopharyngeal swab, presence of viral mutation(s) within the areas targeted  by this assay, and inadequate number of viral copies(<138 copies/mL). A negative result must be combined with clinical observations, patient history, and epidemiological information. The expected result is Negative.  Fact Sheet for Patients:  bloggercourse.com  Fact Sheet for Healthcare Providers:  seriousbroker.it  This test is no t yet approved or cleared by the [place]  FDA and  has been authorized for detection and/or diagnosis of SARS-CoV-2 by FDA under an Emergency Use Authorization (EUA). This EUA will remain  in effect (meaning this test can be used) for the duration of the COVID-19 declaration under Section 564(b)(1) of the Act, 21 U.S.C.section 360bbb-3(b)(1), unless the authorization is terminated  or revoked sooner.       Influenza A by PCR NEGATIVE NEGATIVE Final   Influenza B by PCR NEGATIVE NEGATIVE Final    Comment: (NOTE) The Xpert Xpress SARS-CoV-2/FLU/RSV plus assay is intended as an aid in the diagnosis of influenza from Nasopharyngeal swab specimens and should not be used as a sole basis for treatment. Nasal washings and aspirates are unacceptable for Xpert Xpress SARS-CoV-2/FLU/RSV testing.  Fact Sheet for Patients: bloggercourse.com  Fact Sheet for Healthcare Providers: seriousbroker.it  This test is not yet approved or cleared by the [place]  FDA and has been authorized for detection and/or diagnosis of SARS-CoV-2 by FDA under an Emergency Use Authorization (EUA). This EUA will remain in effect (meaning this test can be used) for the duration of the COVID-19 declaration under Section 564(b)(1) of the Act, 21 U.S.C. section 360bbb-3(b)(1), unless the authorization is terminated or revoked.     Resp Syncytial Virus by PCR NEGATIVE NEGATIVE Final    Comment: (NOTE) Fact Sheet for Patients: bloggercourse.com  Fact  Sheet for Healthcare Providers: seriousbroker.it  This test is not yet approved or cleared by the [place]  FDA and has been authorized for detection and/or diagnosis of SARS-CoV-2 by FDA under an Emergency Use Authorization (EUA). This EUA will remain in effect (meaning this test can  be used) for the duration of the COVID-19 declaration under Section 564(b)(1) of the Act, 21 U.S.C. section 360bbb-3(b)(1), unless the authorization is terminated or revoked.  Performed at [facility], [address] [place], [place] [address]   Culture, blood (Routine x 2)     Status: None   Collection Time: 10/25/24  5:11 PM   Specimen: BLOOD  Result Value Ref Range Status   Specimen Description BLOOD BLOOD LEFT WRIST  Final   Special Requests   Final    BOTTLES DRAWN AEROBIC AND ANAEROBIC Blood Culture results may not be optimal due to an inadequate volume of blood received in culture bottles   Culture   Final    NO GROWTH 5 DAYS Performed at [facility], [address] [place], [place] [address]    Report Status 10/30/2024 FINAL  Final  Respiratory (~20 pathogens) panel by PCR     Status: None   Collection Time: 10/28/24  9:05 AM   Specimen: Nasopharyngeal Swab; Respiratory  Result Value Ref Range Status   Adenovirus NOT DETECTED NOT DETECTED Final   Coronavirus 229E NOT DETECTED NOT DETECTED Final    Comment: (NOTE) The Coronavirus on the Respiratory Panel, DOES NOT test for the novel  Coronavirus (2019 nCoV)    Coronavirus HKU1 NOT DETECTED NOT DETECTED Final   Coronavirus NL63 NOT DETECTED NOT DETECTED Final   Coronavirus OC43 NOT DETECTED NOT DETECTED Final   Metapneumovirus NOT DETECTED NOT DETECTED Final   Rhinovirus / Enterovirus NOT DETECTED NOT DETECTED Final   Influenza A NOT DETECTED NOT DETECTED Final   Influenza B NOT DETECTED NOT DETECTED Final   Parainfluenza Virus 1 NOT DETECTED NOT DETECTED Final   Parainfluenza Virus 2  NOT DETECTED NOT DETECTED Final   Parainfluenza Virus 3 NOT DETECTED NOT DETECTED Final   Parainfluenza Virus 4 NOT DETECTED NOT DETECTED Final   Respiratory Syncytial Virus NOT DETECTED NOT DETECTED Final   Bordetella pertussis NOT DETECTED NOT DETECTED Final   Bordetella Parapertussis NOT DETECTED NOT DETECTED Final   Chlamydophila pneumoniae NOT DETECTED NOT DETECTED Final   Mycoplasma pneumoniae NOT DETECTED NOT DETECTED Final    Comment: Performed at [facility], [address] [place], [place] [address]    Labs: CBC: Recent Labs  Lab 10/28/24 0424 10/29/24 0546 10/30/24 0523 11/02/24 0534  WBC 8.2 7.4 5.9 5.5  NEUTROABS  --  5.0 3.6 3.1  HGB 11.2* 11.6* 10.9* 10.6*  HCT 35.0* 36.6 33.7* 33.5*  MCV 89.7 89.9 89.6 89.1  PLT 162 185 172 196   Basic Metabolic Panel: Recent Labs  Lab 10/28/24 0424 10/29/24 0546 10/30/24 0523 11/02/24 0534  NA 139 143 146* 143  K 3.8 3.9 3.3* 3.6  CL 102 104 108 108  CO2 23 23 24 24   GLUCOSE 113* 133* 150* 141*  BUN 19 22 22 15   CREATININE 0.85 0.86 0.91 0.88  CALCIUM  10.0 11.0* 10.1 10.5*   Liver Function Tests: No results for input(s): AST, ALT, ALKPHOS, BILITOT, PROT, ALBUMIN in the last 168 hours. CBG: Recent Labs  Lab 11/02/24 0833 11/02/24 1218 11/02/24 1614 11/02/24 2153 11/03/24 0747  GLUCAP 129* 142* 133* 130* 99    Discharge time spent:  37 minutes.  Signed: Drue ONEIDA Potter, MD [facility] 11/03/2024 "

## 2024-12-10 ENCOUNTER — Ambulatory Visit: Admitting: Podiatry

## 2025-03-09 ENCOUNTER — Ambulatory Visit: Admitting: Family Medicine

## 2025-10-29 ENCOUNTER — Ambulatory Visit
